# Patient Record
Sex: Female | Born: 1935 | Hispanic: Refuse to answer
Health system: Midwestern US, Community
[De-identification: ages and names within clinical notes are randomized; demographics above are authoritative.]

## PROBLEM LIST (undated history)

## (undated) DIAGNOSIS — G35 Multiple sclerosis: Secondary | ICD-10-CM

## (undated) DIAGNOSIS — E7800 Pure hypercholesterolemia, unspecified: Secondary | ICD-10-CM

## (undated) DIAGNOSIS — F419 Anxiety disorder, unspecified: Secondary | ICD-10-CM

## (undated) DIAGNOSIS — M75100 Unspecified rotator cuff tear or rupture of unspecified shoulder, not specified as traumatic: Secondary | ICD-10-CM

## (undated) DIAGNOSIS — I480 Paroxysmal atrial fibrillation: Secondary | ICD-10-CM

## (undated) DIAGNOSIS — I422 Other hypertrophic cardiomyopathy: Secondary | ICD-10-CM

## (undated) HISTORY — DX: Anxiety disorder, unspecified: F41.9

## (undated) HISTORY — DX: Unspecified rotator cuff tear or rupture of unspecified shoulder, not specified as traumatic: M75.100

## (undated) HISTORY — DX: Multiple sclerosis: G35

## (undated) HISTORY — DX: Pure hypercholesterolemia, unspecified: E78.00

## (undated) HISTORY — DX: Paroxysmal atrial fibrillation: I48.0

---

## 2008-07-08 HISTORY — PX: ULNAR NERVE REPAIR: SHX2594

## 2009-08-14 DIAGNOSIS — K219 Gastro-esophageal reflux disease without esophagitis: Secondary | ICD-10-CM | POA: Insufficient documentation

## 2009-11-13 DIAGNOSIS — E559 Vitamin D deficiency, unspecified: Secondary | ICD-10-CM | POA: Insufficient documentation

## 2010-07-08 DIAGNOSIS — I422 Other hypertrophic cardiomyopathy: Secondary | ICD-10-CM | POA: Insufficient documentation

## 2010-08-16 DIAGNOSIS — E039 Hypothyroidism, unspecified: Secondary | ICD-10-CM | POA: Insufficient documentation

## 2016-03-28 NOTE — Progress Notes (Signed)
Preadmission Screen Chart Review - Text       Preadmission Screening Chart Review Entered On:  03/28/2016 12:33 EDT    Performed On:  03/28/2016 11:44 EDT by Marylou Mccoy B               Data History   Type of Evaluation :   Offsite Pt Evaluation/Medical Record Review   Date of Onset :   03/06/2016 EDT   Referring Physician :   Jimmey Ralph MD   Marylou Mccoy B - 03/28/2016 11:44 EDT   Review  of Present Illness :   82 yo female with past medical history of progressive MS diagnosed in 1989, CVA with right sided weakness, hypothyroidism and neurogenic bladder admitted to [facility] in [place], [place] on 03/06/16 with recurrent generalized weakness of the upper and lower extremities and poor posture. She has been wheelchair bound for the past few years due to right-sided weakness.    *Progression of MS has not improved with IV shock steroids    *Recurrent C diff colitis - resolved  -Diarrhea started on 8/31 and persisted, now resolved    *Ileus on Sept 14 - slow improvement    *Chronic constipation, reported per husband  -KUB shows progression of colonic distention but patient remians asymptomatic except for some mild abdominal distention  -Flex sig done, showed no evidence of malignancy or active bleed.     *Bloody low-grade stool  -H&H stable  -Suspect due to severe diarrhea followed by constipation precipitating hemorrhoidal bleed vs colonic injury    *Asympotomatic hypotension  -Cortisol levels normal  -Limit IV fluids d/t diffuse anasarca and lower extremity swelling    *Edema in bilateral lower extremities  -Chronic  -Ace wraps       Marylou Mccoy B - 03/28/2016 13:33 EDT   Med History   Medication List   (As Of: 03/28/2016 13:57:46 EDT)        Problem History   (As Of: 03/28/2016 13:57:46 EDT)   Problems(Active)    Chronic venous insufficiency (SNOMED CT  :16109604 )  Name of Problem:   Chronic venous insufficiency ; Recorder:   Giove,  Lauri B; Confirmation:   Confirmed ; Classification:   Patient Stated ; Code:    [id number] ; Contributor System:   Dietitian ; Last Updated:   03/28/2016 12:03 EDT ; Life Cycle Date:   03/28/2016 ; Life Cycle Status:   Active ; Vocabulary:   SNOMED CT        CVA (cerebral vascular accident) (SNOMED CT  :147829562 )  Name of Problem:   CVA (cerebral vascular accident) ; Recorder:   Giove,  Lauri B; Confirmation:   Confirmed ; Classification:   Patient Stated ; Code:   [id number] ; Contributor System:   Dietitian ; Last Updated:   03/28/2016 12:02 EDT ; Life Cycle Date:   03/28/2016 ; Life Cycle Status:   Active ; Vocabulary:   SNOMED CT   ; Comments:        03/28/2016 12:02 - Marylou Mccoy B  Residual right sided weakness      Hyperlipidemia (SNOMED CT  :69629528 )  Name of Problem:   Hyperlipidemia ; Recorder:   Giove,  Lauri B; Confirmation:   Confirmed ; Classification:   Patient Stated ; Code:   [id number] ; Contributor System:   PowerChart ; Last Updated:   03/28/2016 12:03 EDT ; Life Cycle Date:   03/28/2016 ; Life Cycle Status:   Active ;  Vocabulary:   SNOMED CT        Hypothyroidism (SNOMED CT  :16109604 )  Name of Problem:   Hypothyroidism ; Recorder:   Giove,  Lauri B; Confirmation:   Confirmed ; Classification:   Patient Stated ; Code:   [id number] ; Contributor System:   Dietitian ; Last Updated:   03/28/2016 12:04 EDT ; Life Cycle Date:   03/28/2016 ; Life Cycle Status:   Active ; Vocabulary:   SNOMED CT        Multiple sclerosis (SNOMED CT  :14782956 )  Name of Problem:   Multiple sclerosis ; Recorder:   Giove,  Lauri B; Confirmation:   Confirmed ; Classification:   Patient Stated ; Code:   [id number] ; Contributor System:   Dietitian ; Last Updated:   03/28/2016 12:02 EDT ; Life Cycle Date:   03/28/2016 ; Life Cycle Status:   Active ; Vocabulary:   SNOMED CT        Neurogenic bladder (SNOMED CT  :8469629528 )  Name of Problem:   Neurogenic bladder ; Recorder:   Giove,  Lauri B; Confirmation:   Confirmed ; Classification:   Patient Stated ; Code:   [id number] ; Contributor System:   Dietitian ;  Last Updated:   03/28/2016 12:03 EDT ; Life Cycle Date:   03/28/2016 ; Life Cycle Status:   Active ; Vocabulary:   SNOMED CT        Osteoporosis (SNOMED CT  :725366440 )  Name of Problem:   Osteoporosis ; Recorder:   Giove,  Lauri B; Confirmation:   Confirmed ; Classification:   Patient Stated ; Code:   [id number] ; Contributor System:   PowerChart ; Last Updated:   03/28/2016 12:04 EDT ; Life Cycle Date:   03/28/2016 ; Life Cycle Status:   Active ; Vocabulary:   SNOMED CT        UTI (urinary tract infection) (SNOMED CT  :387564332 )  Name of Problem:   UTI (urinary tract infection) ; Recorder:   Giove,  Lauri B; Confirmation:   Confirmed ; Classification:   Patient Stated ; Code:   [id number] ; Contributor System:   PowerChart ; Last Updated:   03/28/2016 12:03 EDT ; Life Cycle Date:   03/28/2016 ; Life Cycle Status:   Active ; Vocabulary:   SNOMED CT          Allergy   (As Of: 03/28/2016 13:57:46 EDT)   Allergies (Active)   No Known Allergies  Estimated Onset Date:   Unspecified ; Created By:   Giove,  Lauri B; Reaction Status:   Active ; Category:   Drug ; Substance:   No Known Allergies ; Type:   Allergy ; Updated By:   Trisha Mangle; Reviewed Date:   03/28/2016 12:04 EDT        Data   Preadmission Data Results :   No qualifying data available.     Weight Dosing :   58 kg(Converted to: 127.868 lb, 2,045.890 oz)    Height :   157 cm(Converted to: 5 ft 2 in, 5.15 ft, 61.81 in)    Body Mass Index Dosing :   24 kg/m2   Current IV :   No   Giove,  Lauri B - 03/28/2016 11:44 EDT   Pain Present :   Yes actual or suspected pain   Marylou Mccoy B - 03/28/2016 13:33 EDT   Current PICC Line :   No   PPD Test :  No   Isolation Precautions :   Contact   Organism :   Clostridium Difficile (C-Diff)   Marylou Mccoy B - 03/28/2016 11:44 EDT   Preadm Precautions Grid   Aspiration :   Yes   Fall :   Yes   Transfer/Mobility Limitations :   Yes   Marylou Mccoy B - 03/28/2016 11:44 EDT   Behavioral Problems :   None   Diet Type :   Regular,  Other: low fiber   Marylou Mccoy B - 03/28/2016 11:44 EDT   Pain Assessment   Pain Location :   Shoulder   Giove,  Lauri B - 03/28/2016 13:33 EDT   Lab Values, Diagnostics, Vital Signs   Preadm Lab Results Grid     Date #1          Date Lab Completed :    03/27/2016 EDT              Glucose :    80               Hemoglobin :    12               Platelet :    377               Potassium :    4.3               Sodium :    139               WBC :    5.6                 Giove,  Lauri B - 03/28/2016 13:33 EDT         Other Preadmission Lab Results :   9/20  Cr 0.3  HCT 377         Giove,  Lauri B - 03/28/2016 13:33 EDT   Preadm Recent Vital Sign Grid     Date #1          Date Vital Sign Taken :    03/27/2016 EDT              Respiratory Rate :    16 br/min              Systolic Blood Pressure :    104 mmHg              Diastolic Blood Pressure :    64 mmHg              Comments  (Comment: SpO2 93% RA [Giove,  Lauri B - 03/28/2016 13:33 EDT] )         Marylou Mccoy B - 03/28/2016 13:33 EDT         Lab Results RTF :   No qualifying data available.     Vital Signs Results :   No qualifying data available.     Marylou Mccoy B - 03/28/2016 11:44 EDT   Respiratory   Respiratory Status Results :   No qualifying data available.     Respiratory Needs :   None, Room air   Marylou Mccoy B - 03/28/2016 11:44 EDT   Bowel and Bladder   Premorbid Bladder Management :   Incontinent   Premorbid Urinary Elimination :   Disposable brief, Other: neurogenic bladder with bladder stimulator   Urinary Elimination :   Disposable brief, Other: Per RN, patient  is able to tell her when the brief needs to be changed.   Bladder Management :   Incontinent   Bowel Results :   No qualifying data available.     Premorbid Bowel Management :   Incontinent   Bowel Management :   Incontinent   Ostomy :   No   Last Bowel Movement :   03/27/2016 EDT   Giove,  Lauri B - 03/28/2016 11:44 EDT   Wound   Incision Wound Results :   No qualifying data available.     Trisha Mangle - 03/28/2016 11:44 EDT   Functional Status   Languages :   English   Preferred Mode of Communication :   Verbal   Supports in Hospital? :   Glasses   Trisha Mangle - 03/28/2016 13:33 EDT   Functional Status Results :   No qualifying data available.     Marylou Mccoy B - 03/28/2016 11:44 EDT   Premorbid Medication Self Management   Bathing :   Modified independence   Bed Mobility :   Complete independence   Bed Wheelchair Transfer :   Complete independence   Bladder :   Modified independence   Bowel :   Modified independence   Eating :   Complete independence   Grooming :   Modified independence   Locomotion Walk :   Modified independence   Locomotion Wheelchair :   Complete independence   Lower Extremity Dressing :   Complete independence   Sit to Stand :   Modified independence   Supine to Sit :   Modified independence   Toilet Transfer :   Modified independence   Toileting :   Modified independence   Tub, Shower Transfer :   Modified independence   Upper Extremity Dressing :   Complete independence   Comprehension :   Complete independence   Expression :   Complete independence   Cognition :   Complete independence   Social Interaction :   Complete independence   Medication Self Management :   Modified independence   Marylou Mccoy B - 03/28/2016 11:44 EDT   Current Medication Self-Management   Bathing :   Maximal assistance   Bed Mobility :   Maximal assistance   Bladder :   Total assistance   Bowel :   Total assistance   Eating :   Moderate assistance   Locomotion Wheelchair :   Does not occur   Sit to Stand :   Maximal assistance   Supine to Sit :   Maximal assistance   Tub, shower transfer :   Does not occur   Comprehension :   Complete independence   Expression :   Complete independence   Cognition :   Complete independence   Social Interaction :   Complete independence   Medication Self-Management :   Does not occur   Marylou Mccoy B - 03/28/2016 11:44 EDT   Bed Wheelchair Transfer :   Does not occur    Grooming :   Moderate assistance   Locomotion Walk :   Total assistance   Lower Extremity Dressing :   Maximal assistance   Toilet Transfer :   Maximal assistance   Toileting :   Maximal assistance   Upper Extremity Dressing :   Maximal assistance   Trisha Mangle - 03/28/2016 13:33 EDT   Anticipated Rehab Goals   Bathing :   Minimal  contact assistance   Bed Mobility :   Minimal contact assistance   Bed Wheelchair Transfer :   Minimal contact assistance   Bladder :   Minimal contact assistance   Bowel :   Minimal contact assistance   Eating :   Supervision or setup   Grooming :   Minimal contact assistance   Locomotion Walk :   Minimal contact assistance   Locomotion Wheelchair :   Complete independence   Lower Extremity Dressing :   Minimal contact assistance   Sit to Stand :   Minimal contact assistance   Supine to Sit :   Minimal contact assistance   Toilet Transfer :   Minimal contact assistance   Toileting :   Minimal contact assistance   Tub, Shower Transfer :   Minimal contact assistance   Upper Extremity Dressing :   Minimal contact assistance   Comprehension :   Complete independence   Expression :   Complete independence   Cognition :   Complete independence   Social Interaction :   Complete independence   Medication Self-Management :   Minimal contact assistance   Marylou Mccoy B - 03/28/2016 11:44 EDT   Comments :   Per RN, patient is unable to lift her arms past her nipple line. They are assisting with eating and medication administration. Using hoyer lift to transfer bed to chair.      Marylou Mccoy B - 03/28/2016 11:44 EDT   Care Tool Section GG: Functional Abilities and Goals   Functional Abilities and Goals Grid   GG0100 Self Care :   Needed some help - Patient/Resident needed partial assistance from another person to complete activities. - 2   GG0100 Indoor Mobility (Ambulation) :   Independent - Patient/Resident completed the activities by him/herself, with or without an assistive device, with no  assistance from a helper. - 3   GG0100 Stairs :   Unknown - 8   GG0100 Functional Cognition :   Independent - Patient/Resident completed the activities by him/herself, with or without an assistive device, with no assistance from a helper. - 3   Trisha Mangle - 03/28/2016 11:44 EDT   GG0110 Prior Device Use :   Motorized wheelchair or scooter, Kathleen Lime - 03/28/2016 11:44 EDT   Home Environment   Home Environment Results :   No qualifying data available.     Living Situation :   Home with family support   Lives With :   Family, Spouse   Lives In :   Single level home   Prior Devices/Aids used by Patient :   Agricultural consultant, Motorized wheelchair or scooter   Prior Devices/Aids Details :   Patient uses motorized wheelchair. Ambulates to bathroom using a rolling walker     Trisha Mangle - 03/28/2016 11:44 EDT   Home Setup Grid   Primary Bedroom :   1st floor   Primary Bathroom :   1st floor   Kitchen :   1st floor   Laundry :   1st floor   Trisha Mangle - 03/28/2016 11:44 EDT   Patient's Responsibilities Rehab :   Leisure/Play/Hobbies, Personal ADL, Social participation   Patient's Lifestyle :   Sedentary   Community Services/Current Home Treatments :   Handicap Placard   Preadm Support System :   Patient lives with very supportive husband, Kiaira Pointer [phone] in [place], [place]. They have a daughter  in [place], [place] [phone] and a daughter in [place].   Preadm Amt/Type of Assist Caregiver Able to Prov :   24-hour Supervision   Culture/Spiritual Beliefs to Incorporate :   No   Marylou Mccoy B - 03/28/2016 11:44 EDT   Comments :   Intention is to relocate from [place] to [place] or [place] on discharge from [facility]     Trisha Mangle - 03/28/2016 13:33 EDT   Plan   Risk for Clinical Complications and Medical Necessity for Inpatient Acute Rehabilitation Care :   Requires medical management/24-hour nursing of complex comorbidities, labs (Metabolic Panel,  Prothrombin/INR, CBC, Vitamin D, UA), medications (see medications list), pain, sleep hygiene, anticoagulation, nutrition, hydration, neurological, pulmonary, and cardiac status, and preventive healthcare.    At risk for the following complications: DVT/PE, pneumonia, malnutrition, neurological decline, respiratory insufficiency,   worsening activity intolerance, complications from anticoagulation, skin breakdown, inadequate sleep, recurring stroke, and constipation.       Preadm Expected Level of Improvement :   Fair   Preadm Patient/Family Agreement :   Yes   Rehab Expected Length of Stay :   16 days   Preadm PT Hours/Day :   1.5   Preadm OT Hours/Day :   1.5   Preadm SLP Hours/Day :   N/A   Rehab Nursing Care :   24/7   Preadm Neuropsychology Hours/Day :   N/A   Preadm Prosthetics/Orthotics Hrs/Day :   N/A   Marylou Mccoy B - 03/28/2016 11:44 EDT   Additional Information   Preadm Additional Information :   Patient transferring from [facility] system in [place], [place],  [place] B - 03/28/2016 11:44 EDT   Benefits   Insurance Information :   Medicare, Other: BCBS medicare supplement, secondary   Marylou Mccoy B - 03/28/2016 11:44 EDT   Preadmission Review   Appropriate for Inpatient Rehab Hospital Admission :   Willing to participate in prescribed intensity of care, Able to actively participate in 3hrs/5 times per wk therapy service, Medical conditions can be managed in the inpt rehab unit, Patient requires an intensive level of rehab services   Estimated Length of Stay :   3 weeks   Rationale for Admission to Inpt Rehab :   82 yo lady from [place] with progressive multiple sclerosis dianosed in 1989 with recurrent C. Diff and constipation as well as neurogenic bladder who was admitted to [facility] hospital on August 30  with increasing weakness with multiple chronic issues concerning bowel and bladder whose family requested transfer to [facility] for furhter intensive therapy in order to get back to  her premorbid level of functioning at the wheelchair level . who would benefit from therpies as well as internal medicine , urology , and neurological evaluations .       Preadm Prognosis :   Guarded   Preadm Prognosis Guarded Due To :   Severity of impairment   Requires Inpatient Rehab Services :   Physical Therapy, Occupational Therapy, Speech Language Pathology, Nursing, Case Management, Registered Dietician, Social Work   Anticipated Discharge Destination :   Home under care of organized home health service org   Anticipated Discharge Services :   Physical Therapy, Occupational Therapy, Nursing   Physician Review of Preadmission Screening :   I agree with the preadmission screening as documented   Genice Rouge - 03/28/2016 16:43 EDT   Expected Admit Date  to IRF :   03/29/2016 EDT   Funding reviewed with Pt/Caregiver :   Yes   Marylou MccoyGiove,  Lauri B - 03/28/2016 11:44 EDT

## 2016-03-29 NOTE — Nursing Note (Signed)
Adult Patient History Form-Text       Adult Patient History Entered On:  03/29/2016 22:35 EDT    Performed On:  03/29/2016 22:18 EDT by Elmer RampSIOSON, RN, MARIA NORLIS               General Info   In Clinical Trial With Signed Consent for Related Condition :   No signed consent for clinical trial   Mervin HackSIOSON, RN, MARIA NORLIS - 03/30/2016 4:43 EDT   Preferred Name :   Thedora HindersSandie   [facility] [place]   Mode of Arrival on Unit :   Stretcher   Accompanied By :   Myrtie HawkSpouse   In Charge of News (ICON) Name :   Carie Caddylfred Hafford [phone]   Information Given By :   Self   Primary Language :   English   Pregnancy Status :   N/A   Has the patient received chemotherapy or biotherapy within the last 48 hours? :   No   Is the patient currently (2-3 days) receiving radiation treatment? :   No   SIOSON, RN, MARIA NORLIS - 03/29/2016 22:18 EDT   Problem History   Most Recent Hospitalization(s)     Hospitalization #1  Hospitalization #2  Hospitalization #3      Date :    December 26, 2015  ([facility] [[place], [place] NORL[place] - 03/29/2016 22:18 EDT] )             Reason :    multiple sclerosis   UTI   Right leg fracture  (Comment: 4 yrs [facility] [SIOSON, RN, [place] NORL[place] - 03/29/2016 22:18 EDT] )         Elmer RampSIOSON, RN, MARIA NORLIS - 03/29/2016 22:18 EDT  SIOSON, RN, MARIA NORLIS - 03/29/2016 22:18 EDT  SIOSON, RN, MARIA NORLIS - 03/29/2016 22:18 EDT       (As Of: 03/29/2016 22:35:31 EDT)   Problems(Active)    Chronic venous insufficiency (SNOMED CT  :1308657834824018 )  Name of Problem:   Chronic venous insufficiency ; Recorder:   Giove,  Lauri B; Confirmation:   Confirmed ; Classification:   Patient Stated ; Code:   [id number] ; Contributor System:   DietitianowerChart ; Last Updated:   03/28/2016 12:03 EDT ; Life Cycle Date:   03/28/2016 ; Life Cycle Status:   Active ; Vocabulary:   SNOMED CT        Hyperlipidemia (SNOMED CT  :8413244092826017 )  Name of Problem:   Hyperlipidemia ; Recorder:   Giove,  Lauri B; Confirmation:   Confirmed ;  Classification:   Patient Stated ; Code:   [id number] ; Contributor System:   PowerChart ; Last Updated:   03/28/2016 12:03 EDT ; Life Cycle Date:   03/28/2016 ; Life Cycle Status:   Active ; Vocabulary:   SNOMED CT        Hypothyroidism (SNOMED CT  :6440347468268011 )  Name of Problem:   Hypothyroidism ; Recorder:   Giove,  Lauri B; Confirmation:   Confirmed ; Classification:   Patient Stated ; Code:   2595638768268011 ; Contributor System:   DietitianowerChart ; Last Updated:   03/28/2016 12:04 EDT ; Life Cycle Date:   03/28/2016 ; Life Cycle Status:   Active ; Vocabulary:   SNOMED CT        Multiple sclerosis (SNOMED CT  :5643329541398015 )  Name of Problem:   Multiple sclerosis ; Recorder:   Giove,  Lauri B; Confirmation:  Confirmed ; Classification:   Patient Stated ; Code:   [id number] ; Contributor System:   Dietitian ; Last Updated:   03/28/2016 12:02 EDT ; Life Cycle Date:   03/28/2016 ; Life Cycle Status:   Active ; Vocabulary:   SNOMED CT        Neurogenic bladder (SNOMED CT  :8119147829 )  Name of Problem:   Neurogenic bladder ; Recorder:   Giove,  Lauri B; Confirmation:   Confirmed ; Classification:   Patient Stated ; Code:   [id number] ; Contributor System:   Dietitian ; Last Updated:   03/28/2016 12:03 EDT ; Life Cycle Date:   03/28/2016 ; Life Cycle Status:   Active ; Vocabulary:   SNOMED CT        Osteoporosis (SNOMED CT  :846962952 )  Name of Problem:   Osteoporosis ; Recorder:   Giove,  Lauri B; Confirmation:   Confirmed ; Classification:   Patient Stated ; Code:   841324401 ; Contributor System:   PowerChart ; Last Updated:   03/28/2016 12:04 EDT ; Life Cycle Date:   03/28/2016 ; Life Cycle Status:   Active ; Vocabulary:   SNOMED CT        UTI (urinary tract infection) (SNOMED CT  :027253664 )  Name of Problem:   UTI (urinary tract infection) ; Recorder:   Giove,  Lauri B; Confirmation:   Confirmed ; Classification:   Patient Stated ; Code:   403474259 ; Contributor System:   PowerChart ; Last Updated:   03/28/2016 12:03 EDT ; Life Cycle  Date:   03/28/2016 ; Life Cycle Status:   Active ; Vocabulary:   SNOMED CT          Diagnoses(Active)    C. difficile colitis  Date:   03/29/2016 ; Diagnosis Type:   Discharge ; Confirmation:   Confirmed ; Clinical Dx:   C. difficile colitis ; Classification:   Medical ; Code:   ICD-10-CM ; Probability:   0 ; Diagnosis Code:   A04.7      Chronic constipation  Date:   03/29/2016 ; Diagnosis Type:   Discharge ; Confirmation:   Confirmed ; Clinical Dx:   Chronic constipation ; Classification:   Medical ; Code:   ICD-10-CM ; Probability:   0 ; Diagnosis Code:   K59.09      Chronic venous insufficiency  Date:   03/29/2016 ; Diagnosis Type:   Discharge ; Confirmation:   Confirmed ; Clinical Dx:   Chronic venous insufficiency ; Classification:   Medical ; Code:   ICD-10-CM ; Probability:   0 ; Diagnosis Code:   I87.2      Hypothyroidism  Date:   03/29/2016 ; Diagnosis Type:   Discharge ; Confirmation:   Confirmed ; Clinical Dx:   Hypothyroidism ; Classification:   Medical ; Code:   ICD-10-CM ; Probability:   0 ; Diagnosis Code:   E03.9      Ileus  Date:   03/29/2016 ; Diagnosis Type:   Discharge ; Confirmation:   Confirmed ; Clinical Dx:   Ileus ; Classification:   Medical ; Code:   ICD-10-CM ; Probability:   0 ; Diagnosis Code:   K56.7      Multiple sclerosis  Date:   03/29/2016 ; Diagnosis Type:   Discharge ; Confirmation:   Confirmed ; Clinical Dx:   Multiple sclerosis ; Classification:   Medical ; Code:   ICD-10-CM ; Probability:   0 ; Diagnosis Code:   G35  Neurogenic bladder  Date:   03/29/2016 ; Diagnosis Type:   Discharge ; Confirmation:   Confirmed ; Clinical Dx:   Neurogenic bladder ; Classification:   Medical ; Code:   ICD-10-CM ; Probability:   0 ; Diagnosis Code:   N31.9      UTI (urinary tract infection)  Date:   03/29/2016 ; Diagnosis Type:   Discharge ; Confirmation:   Confirmed ; Clinical Dx:   UTI (urinary tract infection) ; Classification:   Medical ; Code:   ICD-10-CM ; Probability:   0 ; Diagnosis Code:    N39.0        Family History   Family History   (As Of: 03/29/2016 22:35:31 EDT)     Allergy   (As Of: 03/29/2016 22:35:31 EDT)   Allergies (Active)   No Known Allergies  Estimated Onset Date:   Unspecified ; Created By:   Giove,  Lauri B; Reaction Status:   Active ; Category:   Drug ; Substance:   No Known Allergies ; Type:   Allergy ; Updated By:   Trisha Mangle; Reviewed Date:   03/29/2016 22:24 EDT        Immunizations   Influenza Vaccine Status :   Non-influenza season (before Oct 1st and after Mar 31st)   Last Tetanus :   Unknown   SIOSON, RN, MARIA NORLIS - 03/29/2016 22:18 EDT   ID Risk Screen   Patient Recent Travel History :   No recent travel   Family Member Travel History :   Unable to obtain   SIOSON, RN, MARIA NORLIS - 03/29/2016 22:18 EDT   Infectious Disease Risk Factor Grid   Chills :   No   Fever :   No   Fatigue :   Yes   Headache :   No   Runny or Stuffy Nose :   No   Sore Throat :   No   Shortness of Breath :   No   New or Worsening Cough :   No   Wheezing :   No   Vomiting :   No   Diarrhea :   No   Abdominal (Stomach Pain) :   No   Muscle Pain :   No   Weakness/Numbness :   Yes   Recent Exposure to Communicable Disease :   No   Illness With Generalized Rash :   No   SIOSON, RN, MARIA NORLIS - 03/29/2016 22:18 EDT   Verify Airborne, Contact Isolation for MERS :   N/A   SIOSON, RN, MARIA NORLIS - 03/29/2016 22:18 EDT   C. diff Screen   Have you had 3 or more loose/watery stool in 24 hours? :   No - but history of C. diff in last 6 months   SIOSON, RN, [place] NORLIS - 03/29/2016 22:18 EDT   Procedure History   Medical Devices :   Stimulator   SIOSON, RN, MARIA NORLIS - 03/30/2016 4:43 EDT          -    Procedure History   (As Of: 03/30/2016 04:44:17 EDT)     Anesthesia Minutes:   0 ; Procedure Name:   ulnar nerve surgery right hand ; Procedure Minutes:   0 ; Comments:     03/29/2016 22:28 - SIOSON, RN, MARIA NORLIS  5 yrs ago ; Last Reviewed Dt/Tm:   03/29/2016 33:29:51 EDT            Anesthesia Minutes:  0  ; Procedure Name:   bladder stimulator ; Procedure Minutes:   0 ; Last Reviewed Dt/Tm:   03/30/2016 04:42:56 EDT            Anesthesia/Sedation   Anesthesia History :   Prior general anesthesia   Anesthesia Reaction :   None   SIOSON, RN, MARIA NORLIS - 03/29/2016 22:18 EDT   Transfusion/Bloodless Med   Transfusion History :   No prior transfusion   Will Patient Accept Blood Transfusion and/or Blood Products :   Yes   SIOSON, RN, MARIA NORLIS - 03/29/2016 22:18 EDT   Nutrition   Home Diet :   Regular   Appetite :   Good   Feeding Ability :   Complete independence   Home Liquid Viscosity :   Thin   Unintentional Weight Change Greater Than 10 lbs in the Last 6 Months :   No   SIOSON, RN, MARIA NORLIS - 03/29/2016 22:18 EDT   Functional   Current Level of Assistance for Self-Care/Mobility :   New decline from baseline   Sleeping Behaviors :   Reports no problems   SIOSON, RN, MARIA NORLIS - 03/30/2016 0:22 EDT   Cognitive Function Concerns Prior to Admission :   None reported   Fear of Falling :   No   Ability to Ambulate Prior to Admission :   Partial assistance   SIOSON, RN, MARIA NORLIS - 03/29/2016 22:18 EDT   Ability to Move in Bed Prior to Admission :   Total assistance     ADLs :   Complete assist   SIOSON, RN, MARIA NORLIS - 03/30/2016 0:22 EDT     Functional Assessment   Bathing :   Requires assistance (1)   Dressing :   Requires assistance (1)   Toileting :   Requires assistance (1)   Transferring Bed or Chair :   Requires assistance (1)   Continence :   Dependent (0)   Feeding :   Requires assistance (1)   ADL Index Score :   5    SIOSON, RN, MARIA NORLIS - 03/30/2016 0:22 EDT   Living and Resources   Living Situation :   Home with family support   Devices/Equipment :   Information systems manager Skilled Services :   Occupational Therapy, Physical Therapy   Lines/Tubes Present on Admission :   None   SIOSON, RN, MARIA NORLIS - 03/29/2016 22:18 EDT   Social History   Social History   (As Of: 04/01/2016 14:20:53 EDT)    Tobacco:        Never smoker   (Last Updated: 04/01/2016 14:20:49 EDT by Katrinka Blazing, RN, KRISTINA K)          Alcohol:        Denies   (Last Updated: 03/29/2016 22:35:10 EDT by Elmer Ramp, RN, MARIA NORLIS)          Substance Abuse:        Denies   (Last Updated: 03/29/2016 22:35:19 EDT by Elmer Ramp, RN, MARIA NORLIS)            Sexual Assault/Domestic Violence Screen   Have You Ever Been Emotionally or Physically Abused by Your Partner :   No   Have You Been Physically Hurt by Someone Within the Past Year :   No   Within the Last Year, Has Anyone Forced You to Have Sexual Activity :   No   SIOSON, RN, MARIA NORLIS - 03/29/2016 22:18 EDT  Spiritual   Do You Receive Comfort From Spiritual Practices :   Yes   Hospital Clergy to Visit :   No   Spiritual Advisor/Minister to be Notified :   No   Cheral Marker - 04/01/2016 14:20 EDT   Religious Preference :   Catholic   SIOSON, RN, MARIA NORLIS - 03/29/2016 22:18 EDT   Advance Directive   *Advance Directive :   Yes   Type of Advance Directive :   Living will   Location of Advance Directive :   Family to bring in copy from home   Patient Wishes to Receive Further Information on Advance Directives :   No   SIOSON, RN, MARIA NORLIS - 03/29/2016 22:18 EDT   Educ Needs   Barriers to Learning :   Acuity of illness   SIOSON, RN, MARIA NORLIS - 03/29/2016 22:18 EDT

## 2016-03-29 NOTE — Progress Notes (Signed)
Nursing Admission Indep Measure - Text       Nursing Admission Indep Measure Scores Entered On:  03/29/2016 23:25 EDT    Performed On:  03/29/2016 23:24 EDT by Elmer Ramp, RN, MARIA NORLIS               Admission Scores   *Number of Bowel Accidents Past Four Days Prior to Admission :   1    *Number of Bladder Accidents Past Four Days Prior to Admission :   1    SIOSON, RN, MARIA NORLIS - 03/29/2016 23:24 EDT   SIOSON, RN, MARIA NORLIS - 03/29/2016 23:24 EDT   Activities of Daily Living Grid   Eating :   Maximal assistance   SIOSON, RN, MARIA NORLIS - 03/30/2016 0:57 EDT     Toileting :   Total assistance   Bed, Chair, Wheelchair Transfer :   Total assistance   Toilet Transfer :   Total assistance   *Comprehension :   Modified independence   SIOSON, RN, MARIA NORLIS - 03/30/2016 0:57 EDT     *Comprehension Mode :   Auditory, Visual   SIOSON, RN, MARIA NORLIS - 03/29/2016 23:24 EDT   *Expression :   Modified independence   SIOSON, RN, MARIA NORLIS - 03/30/2016 0:57 EDT     *Expression Mode :   Vocal   SIOSON, RN, MARIA NORLIS - 03/29/2016 23:24 EDT   Problem Solving Indep Measure :   Modified independence     Memory Indep Measure :   Modified independence   SIOSON, RN, MARIA NORLIS - 03/30/2016 0:57 EDT     *Social Interaction :   Complete independence   SIOSON, RN, MARIA NORLIS - 03/29/2016 23:24 EDT   Goals   Bladder Level of Assistance Goal :   Supervision or setup     Bowel Level of Assistance Goal :   Supervision or setup   SIOSON, RN, MARIA NORLIS - 03/30/2016 0:57 EDT

## 2016-03-29 NOTE — Progress Notes (Signed)
Anticoagulation Monitoring - Text       Pharmacy Anticoagulation Monitoring Entered On:  03/29/2016 21:52 EDT    Performed On:  03/29/2016 21:52 EDT by Jerolyn Center               Anticoagulation Monitoring Chart   Indication for Treatment :   VTE prophylaxis   WATTS,  KATIE - 03/29/2016 21:52 EDT

## 2016-03-29 NOTE — Nursing Note (Signed)
Basic Admission Information - Text       Basic Admission Information Adult Entered On:  03/29/2016 23:14 EDT    Performed On:  03/29/2016 20:08 EDT by Elmer Ramp, RN, MARIA NORLIS               Vital Signs   Temperature Oral :   36.6 degC   Peripheral Pulse Rate :   83 bpm   Respiratory Rate :   20 br/min   Systolic/ Diastolic BP :   124 mmHg   Diastolic Blood Pressure :   101 mmHg (>HHI)    SpO2 :   97 %   O2 Therapy :   Room air   SIOSON, RN, MARIA NORLIS - 03/29/2016 23:13 EDT

## 2016-03-29 NOTE — H&P (Signed)
 Rehab [facility] E. Stanly, MD  Service Date: 03/29/2016    Post-admission evaluation completed on 03/29/2016 at 7:00 p.m.    ADMITTING PHYSICIAN:  Dr. Lynwood Stanly    REFERRING PHYSICIAN:  Dr. Jakie Decent    PRIMARY CARE PHYSICIAN:  Dr. Genevie Lulas    NEUROLOGIST:  Dr. Donnice Lee in [place], [place] .    ADMITTING DIAGNOSES:  1.  Debility secondary to multiple sclerosis flareup.  2.  Clostridium difficile colitis, treated.  3.  Urinary tract infection, treated.  4.  Ileus, treated.  5.  Chronic venous insufficiency.  6.  Hypothyroidism.  7.  Neurogenic bladder.  8.  Neurogenic bowel.  9.  Prior rotator cuff injury on the left shoulder.    CHIEF COMPLAINT AND HISTORY OF PRESENT ILLNESS:  This 82 year old  white female from [place], [place]  with progressive multiple sclerosis  and neurogenic bowel and bladder that was diagnosed in 1989, who is  wheelchair confined, was admitted to the [facility] in [place], [place]  on August 30th with a 3-day history of  increased weakness.  She had been seen in the Emergency Department 3  days earlier and had been admitted and discharged on August 29th but  was brought back due to increasing weakness.  She had previously been  admitted 2 months earlier with  increased lower extremity weakness.   Workup included x-rays which were remarkable only for osteoarthritis  involving the thoracic spine, bilateral shoulders, and left AC joint.   Chest x-ray showed calcified granuloma in the right lung base.  CT  scan of the head was consistent with chronic demyelination and no  findings of acute ischemic infarction.  This was compared to an MRI  that had been done in November 2016.  She has been wheelchair confined  for the last 3 years.  She was given IV shock steroid treatment with  some initial benefit.  On August 31st, she began having diarrhea was  treated with vancomycin and Flagyl, but her family requested that she  be changed to  Dificid as she has had recurrent episodes of C.  difficile colitis 7-8 times in the past.  They are interested in  eventually getting a fecal transplant but know that that is not  available at this facility.  She completed 9 days of Dificid and  Flagyl and was seen by an infectious disease consultant who indicated  that she could discontinue her medication.  She was also found to have  an ileus on September 14th which resolved by September 19th.  KUB of  the abdomen was significant for colonic distention, and she was  therefore seen by gastroenterology.  She had a large bowel movement on  September 21st after getting a Gastrografin enema.  She also underwent  flexible sigmoidoscopy to the transverse colon due to bleeding which  was felt to be secondary to hemorrhoids.  She also had hypotension,  but because of anasarca, IV fluids were limited, and they have been  using Ace wraps, and she has been getting albumin infusion on a p.r.n.  basis.  She was referred to physical and occupational therapies and  remains dependent for all self-care and mobility.  She uses a  motorized wheelchair at home.  She has been requiring Hoyer lift  transfers.  She is maximum assistance for upper and lower extremity  dressing, maximum assistance for toileting,  and maximum assistance to  come from sitting to standing.  She has poor trunk control.  Up until  this year, however, she was able to transfer herself.  She is followed  by Dr. Donnice Lee in [place] for her multiple sclerosis and is  treated with Ampyra.  She is also on baclofen for spasticity and  gabapentin for pain.  She has a bladder stimulator for her neurogenic  bladder which does not work due to a loose lead and is also on oxybutynin   5 mg daily.  She has had multiple UTI's but no cultures are available.   She suffers fromchronic constipation and is on various bowel medicines.  Her family  hopes for her to eventually be evaluated as an outpatient in [place]  at [facility] for her multiple sclerosis but requested transfer to  this facility since she has a daughter who lives here in [place].  Thru the years,multiple drugs have been trialed for treatment of her progressive   MS including Capaxone,Betaserone, Tysabri,Avonex, and others with transient   benefit. Their ultlimate  goal is to return to [place] where they have some in home  assistance but the majority of care is provied by the patiient's husband.    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  Her multiple sclerosis was  diagnosed in 1989.  She has a history of a right femur fracture.  She  has osteoarthritis, a left rotator cuff injury, scoliosis, and a  thyroid nodule.    PAST SURGICAL HISTORY:  Significant only for the bladder stimulator  and right ulnar nerve release.    ALLERGIES:  She has no known drug allergies.    FAMILY HISTORY:  Mother died at age 58 and father at 29.    SOCIAL HISTORY:  She lives in [place], [place]  with her husband, but  they moved there from [place], [place] .  She has adult children, 1  who lives in [place] and 1 who lives in [place]  and one in [place] ..  She uses  alcohol occasionally but has never used tobacco nor illicit drugs. She retired as an Tourist information centre manager.    REVIEW OF SYSTEMS:  She has some balance deficits, coordination  deficits, strength deficits, endurance deficits, range of motion  deficits, and safety awareness deficits.  She fatigues easily.  She is  considered a fall risk and has contact precautions because of her  history of C. difficile colitis. She admits to the the adverse effects of   summer heat of [place]  on her MS. but suffered minimal damage from  the recent hurricain.   Other review of systems is negative  except for those items  mentioned in history of present illness.    PHYSICAL EXAMINATION:  Her weight is 58 kilograms, and her height is 5  feet 2 inches.  Her temperature this morning was 36.6 Celsius with a  pulse of 86, blood pressure 105/66, and O2 sats of 93%.  She is alert,  cooperative, pleasant, and oriented.  Her head is normocephalic.  Her  neck is supple.  There are no bruits.  Her lungs are clear to  auscultation.  Her heart shows regular rhythm.  Abdomen is soft but  distended.  There are positive bowel sounds.  She has 2+ lower  extremity edema bilaterally.  She is  intact cognitively..  Cranial  nerves are intact.  She has weakness in bilateral upper and lower  extremities worse on the right than the left.  Reflexes are  hyperactive.  Sensation is reported to be intact.  She requires  assistance for all bed mobility.  Her left lower extremity is  generally 3 to 4 minus out of 5, the right lower extremity is zero  dorsiflexion, 2 out of 5 plantar flexion, and 3 out of 5 knee  extension.  Her back and sacrum were not inspected at this time and  will be looked at later with nursing.    ASSESSMENT:  Progressive multiple sclerosis with recent flare up and  complications of recurrent Clostridium difficile colitis, neurogenic  bowel and bladder, recent ileus, and left rotator cuff injury who has  been transferred to maximize her independence.    PLAN:  She will be treated with an interdisciplinary team approach  consisting of physical and occupational therapy, social services,  rehab nursing, physicians, and speech language pathology.  Physical  and occupational therapies will design programs that will increase her  strength and stamina and safety in regards to dressing, bathing,  toileting, transitional movements, and wheelchair mechanics and  propulsion at a moderate assistance level.  Speech language pathology  will assess her cognitive functioning.  Rehabilitation nursing will  monitor bowel and bladder functioning and maintain  skin integrity.  We  shall ask Dr. Dale Rimes, a family friend, to assess her urological  status and Dr. Elsie Cleverly to provide medical management.  We shall  also ask neurology to evaluate her next week.  We shall get baseline  lower extremity Dopplers to rule out any evidence of existing deep  vein thrombophlebitis and shall follow her ourselves on a daily basis  to monitor for any intercurrent illnesses that may interfere with her  participation in rehabilitation.  There are no discrepancies noted on  the pre-admission evaluation tool that was completed this morning, and  she appears to have the motivation to participate the requisite 3  hours of therapy 5 days a week.  We shall have weekly team conferences  to upgrade her plan of care and determine her length of stay, but she  will likely benefit from 2-3 weeks to get to a level that will allow  her to go to her daughter's home with home health therapies.  Her medical  prognosis for achieving her goals in rehab is guarded, and barriers  will be the severity of her impairment.      Lynwood FORBES Bruce, MD  TR: *n DD: 03/29/2016 19:13 TD: 03/29/2016 20:08 Job#: [id number]  \[id number]#: [id number]  \[id number]\    Signature Line     Electronically Signed on 03/30/2016 03:20 PM EDT   ________________________________________________   MARZETTE LYNWOOD FORBES               Modified by: MARZETTE LYNWOOD FORBES on 03/30/2016 03:20 PM EDT      Modified by: MARZETTE LYNWOOD FORBES on 03/30/2016 03:20 PM EDT

## 2016-03-30 NOTE — Progress Notes (Signed)
Functional Indep Measure Scores - Text       Functional Independence Measure Scores Entered On:  03/30/2016 16:34 EDT    Performed On:  03/30/2016 16:32 EDT by HOBBY, OT, LESLIE M               Eating Score   Patient's Independence Level With Eating Tasks :   Setup/Supervision   Supervision or Setup Needed :   Gather equipment, Open containers, Pour liquid into containers   Functional Independence Measure Eating :   Supervision or setup   HOBBY, OT, LESLIE M - 03/30/2016 16:32 EDT   Grooming Score   Patient's Independence Level with Grooming Tasks :   Assistance   Type of Assistance Needed :   Assistance with grooming tasks   Task Assessed :   Combing/Brushing hair, Oral care, Washing/Drying face, Washing/Drying hands   Tasks Requiring Assistance :   Combing/Brushing hair, Washing/Drying hands   Grooming :   Moderate assistance   HOBBY, OT, LESLIE M - 03/30/2016 16:32 EDT   Bathing Score   Patient's Independence Level with Bathing Tasks :   Assistance   Type of Assistance Necessary :   Requires assistance of 2 people   Functional Independence Measure Bathing :   Total assistance   HOBBY, OT, LESLIE M - 03/30/2016 16:32 EDT   Upper Body Dressing Score   Patient's Independence Level with Upper Body Dressing Tasks :   Assistance   Type of Assistance Necessary :   Assistance with dressing tasks   Tasks Assessed :   Thread/Unthread right sleeve, Thread/Unthread left sleeve, Pull/Remove head through neckline, Pull/Remove over trunk, Pull around the back, Hook/Unhook bra, Thread/Unthread right bra strap, Thread/Unthread left bra strap   Tasks Requiring Assistance :   Thread/Unthread right sleeve, Pull/Remove head through neckline, Pull/Remove over trunk, Pull around the back, Hook/Unhook bra, Thread/Unthread right bra strap   UE Dressing :   Maximal assistance   HOBBY, OT, LESLIE M - 03/30/2016 16:32 EDT   Lower Body Dressing Score   Patient's independence Level with Lower Body Dressing Tasks :   Assistance   Type of Assistance  Necessary :   Requires assistance of 2 people   LE Dressing :   Total assistance   HOBBY, OT, LESLIE M - 03/30/2016 16:32 EDT   Toileting Score   Patient's Independence Level with Toileting Tasks :   Assistance   Type of Assistance Necessary :   Requires assistance of 2 people   Toileting :   Total assistance   HOBBY, OT, LESLIE M - 03/30/2016 16:32 EDT   Transfer Bed/Chair/WC Score   Patient's independence Level with Bed, Chair, Wheelchair Tasks :   Assistance   Type of Assistance Necessary :   Mechanical lift required, Total assist (patient performs less than 25%)   Bed, Chair, Wheelchair Transfer :   Total assistance   HOBBY, OT, LESLIE M - 03/30/2016 16:32 EDT   Transfer Toilet Score   Patient's Independence Level with Transfer Toilet Tasks :   Assistance   Amount of Assistance Necessary :   Mechanical lift required, Total assist (patient performs less than 25%)   Transfer Toilet :   Total assistance   HOBBY, OT, LESLIE M - 03/30/2016 16:32 EDT   Comprehension Score   Mode of Comprehension :   Auditory   Comprehends Complex or Abstract Information Without Prompting or Cueing :   Yes   Understands Complex or Abstract Directions and Conversations :   Mild  difficulty   Comprehension Indep Measure Interim :   Modified independence   HOBBY, OT, LESLIE M - 03/30/2016 16:32 EDT   Expression Score   Expression Mode :   Vocal   Expresses Complex or Abstract Information Without Prompting or Cueing :   Yes   Expresses Complex or Abstract Ideas :   Clearly and fluently at all times   Expression Indep Measure Interim :   Complete independence   HOBBY, OT, LESLIE M - 03/30/2016 16:32 EDT   Social Interaction Score   Interacts Appropriately Without Supervision :   Yes   Interacts Appropriately :   Most of the time and only occasionally loses control, Requires more than reasonable time to make decisions   Social Interaction Indep Measure Interim :   Modified independence   HOBBY, OT, LESLIE M - 03/30/2016 16:32 EDT   Problem Solving  Score   Solves Complex Problems :   Yes   Ability to Solve Complex Problems :   Makes decisions with mild difficulty   Problem Solving Indep Measure Interim :   Modified independence   HOBBY, OT, LESLIE M - 03/30/2016 16:32 EDT   Memory Score   Recognizes, Remembers Routines, and Executes Requests Without Prompting :   Yes   Remembers and Executes Requests :   Mild difficulty   Memory Indep Measure Interim :   Modified independence   HOBBY, OT, LESLIE M - 03/30/2016 16:32 EDT

## 2016-03-30 NOTE — Progress Notes (Signed)
Functional Indep Measure Scores - Text       Functional Independence Measure Scores Entered On:  03/30/2016 21:34 EDT    Performed On:  03/30/2016 21:32 EDT by Moshe Cipro, RN, ELIZABETH C               Eating Score   Patient's Independence Level With Eating Tasks :   Independent/Modified independence   Independent or Modifications Needed :   Complete independence   Functional Independence Measure Eating :   Complete independence   MANAHAN, RN, ELIZABETH C - 03/30/2016 21:32 EDT   Toileting Score   Patient's Independence Level with Toileting Tasks :   Assistance   Type of Assistance Necessary :   Requires assistance of 2 people   Toileting :   Total assistance   [facility], RN, Dorise Hiss - 03/30/2016 21:32 EDT   Bladder Management Score   Patient's Independence Level with Bladder Management Tasks :   Assistance   Type of Assistance Necessary :   Helper positions and holds bedpan, assists patient to roll on AND off the bedpan, Helper changes linen or clothing/cleans up spill, Helper changes diaper or absorbent pad, Helper initiates timed voiding schedule, Two helpers needed   Functional Independence Measure Bladder Management :   Total assistance   [facility], RN, ELIZABETH C - 03/30/2016 21:32 EDT   Bowel Management Score   Patient's Independence Level with Bowel Management Tasks :   Assistance   Type of Assistance Necessary :   Helper positions and holds bedpan, assists patient to roll on AND off the bedpan, Helper changes linen or clothing/cleans up spill, Helper changes diaper, Two helpers needed   Functional Independence Measure Bowel Management :   Total assistance   [facility], RN, Dorise Hiss - 03/30/2016 21:32 EDT   Transfer Bed/Chair/WC Score   Patient's independence Level with Bed, Chair, Wheelchair Tasks :   Assistance   Type of Assistance Necessary :   Two helpers needed, Mechanical lift required   Bed, Chair, Wheelchair Transfer :   Total assistance   [facility], RN, Dorise Hiss 03/30/2016 21:32 EDT   Comprehension Score    Mode of Comprehension :   Auditory   Comprehends Complex or Abstract Information Without Prompting or Cueing :   Yes   Understands Complex or Abstract Directions and Conversations :   At all times   Comprehension Indep Measure Interim :   Complete independence   [facility], RN, Dorise Hiss - 03/30/2016 21:32 EDT   Expression Score   Expression Mode :   Vocal   Expresses Complex or Abstract Information Without Prompting or Cueing :   Yes   Expresses Complex or Abstract Ideas :   Clearly and fluently at all times   Expression Indep Measure Interim :   Complete independence   Moshe Cipro RN, Dorise Hiss - 03/30/2016 21:32 EDT   Social Interaction Score   Interacts Appropriately Without Supervision :   Yes   Interacts Appropriately :   At all times   Social Interaction Indep Measure Interim :   Complete independence   Moshe Cipro, RN, Dorise Hiss 03/30/2016 21:32 EDT

## 2016-03-30 NOTE — Progress Notes (Signed)
Functional Indep Measure Scores - Text       Functional Independence Measure Scores Entered On:  03/30/2016 11:40 EDT    Performed On:  03/30/2016 9:30 EDT by Eliseo Gum, PT, AMBER R               Transfer Bed/Chair/WC Score   Patient's independence Level with Bed, Chair, Wheelchair Tasks :   Assistance   Type of Assistance Necessary :   Assistance for more than half (patient performs 25% - 49% of tasks)   Bed, Chair, Wheelchair Transfer :   Maximal assistance   [place], PT, AMBER R - 03/30/2016 11:39 EDT   Walk/Wheelchair Score   Mode of Locomotion Goal :   Wheelchair   Type of Wheelchair Goal :   Power wheelchair   Mode of Locomotion on Discharge :   Wheelchair   Patient Ambulates :   Does not occur   Functional Independence Measure Walk :   Does not occur   Assessed Locomotion in a Wheelchair :   Yes   Distance Traveled in a Wheelchair :   150 ft   Amount of Assistance Necessary :   Patient performs less than 25% of locomotion effort   Functional Independence Measure Wheelchair :   Total assistance   [place], PT, AMBER R - 03/30/2016 11:39 EDT

## 2016-03-30 NOTE — Progress Notes (Signed)
Functional Indep Measure Scores - Text       Functional Independence Measure Scores Entered On:  03/30/2016 0:32 EDT    Performed On:  03/30/2016 0:32 EDT by Elmer RampSIOSON, RN, MARIA NORLIS               Bladder Management Score   Patient's Independence Level with Bladder Management Tasks :   Assistance   Type of Assistance Necessary :   Helper changes linen or clothing/cleans up spill, Helper changes diaper or absorbent pad   Functional Independence Measure Bladder Management :   Total assistance   SIOSON, RN, MARIA NORLIS - 03/30/2016 0:32 EDT   Bowel Management Score   Patient's Independence Level with Bowel Management Tasks :   Assistance   Type of Assistance Necessary :   Helper changes linen or clothing/cleans up spill, Helper changes diaper   Functional Independence Measure Bowel Management :   Total assistance   SIOSON, RN, MARIA NORLIS - 03/30/2016 0:32 EDT

## 2016-03-30 NOTE — Progress Notes (Signed)
Functional Indep Measure Scores - Text       Functional Independence Measure Scores Entered On:  03/30/2016 1:00 EDT    Performed On:  03/30/2016 0:59 EDT by Elmer Ramp, RN, MARIA NORLIS               Admission Information   Swallowing Status on Admission :   Regular food   SIOSON, RN, MARIA NORLIS - 03/30/2016 0:59 EDT

## 2016-03-30 NOTE — Progress Notes (Signed)
IRF-PAI Quality Indicators Adm -Text       IRF-PAI Quality Indicators Admission Entered On:  03/30/2016 1:11 EDT    Performed On:  03/30/2016 1:10 EDT by Elmer Ramp, RN, MARIA NORLIS               Section B: Hearing Speech and Vision   BB0700 Expression of Ideas and Wants :   Expresses complex messages without difficulty and with speech that is clear and easy to understand - 4   BB0800 Understanding Verbal Content :   Usually understands - Understands most conversations, but misses some part/intent of message. Requires cues at times to understand - 3   SIOSON, RN, MARIA NORLIS - 03/30/2016 1:10 EDT   Section H: Bladder and Bowel   H0350 Bladder Continence :   Always incontinent   H0400 Bowel Continence :   Always incontinent   SIOSON, RN, MARIA NORLIS - 03/30/2016 1:10 EDT   Section J: Admission Health Conditions   J1750 2 or More Fall,Fall Injury Last Yr :   No   J2000 Major Surgery 100 Days Pre Admit :   No   SIOSON, RN, MARIA NORLIS - 03/30/2016 1:10 EDT

## 2016-03-30 NOTE — Progress Notes (Signed)
 OT Inpatient Evaluation - Text       OT Inpatient Evaluation Entered On:  03/30/2016 7:21 EDT    Performed On:  03/30/2016 7:21 EDT by HOBBY, OT, LESLIE M               Reason for Treatment   Subjective Statement :   Pt agreeable to tx. I want to have my life back.    Self reported she flew by ambulance plane over night from [place]   to [place] with her husband. Daughter lives in [place].    Self reported prior to June she was independent with ADLs using her power w/c. In June, had UTI and went to hospital and AIR[facility]. Returned home and then had C-diff, rehospitalized.      HOBBY, OT, LESLIE M - 03/30/2016 16:34 EDT   *Chief Complaint :   weakness     HOBBY, OT, LESLIE M - 03/30/2016 12:21 EDT   *Reason for Referral :   Admitted for MS exacerbation    82 yo female with past medical history of progressive MS diagnosed in 1989, CVA with right sided weakness, hypothyroidism and neurogenic bladder admitted to [facility] in [place], [place]  on 03/06/16 with recurrent generalized weakness of the upper and lower extremities and poor posture. She has been wheelchair bound for the past few years due to right-sided weakness.    (per pt, she did not have a CVA)     HOBBY, OT, LESLIE M - 03/31/2016 16:13 EDT     General Information   Occupational Therapy Orders :   OT FIMS - 03/29/16 19:38:00 EDT, Daily  OT Inpatient Evaluation and Treatment Rehab - 03/29/16 19:38:00 EDT, Stop date 03/29/16 19:38:00 EDT     Precautions RTF :   Precaution Orders  Fall Risk Precautions - Ordered    -- 03/29/16 19:38:00 EDT, Stop date 03/29/16 19:38:00 EDT       HOBBY, OT, LESLIE M - 03/30/2016 7:21 EDT   Problem List   (As Of: 03/30/2016 12:32:45 EDT)   Problems(Active)    Chronic venous insufficiency (SNOMED CT  :65175981 )  Name of Problem:   Chronic venous insufficiency ; Recorder:   Giove,  Lauri B; Confirmation:   Confirmed ; Classification:   Patient Stated ; Code:   65175981 ; Contributor System:   PowerChart ; Last Updated:   03/28/2016  12:03 EDT ; Life Cycle Date:   03/28/2016 ; Life Cycle Status:   Active ; Vocabulary:   SNOMED CT        Hyperlipidemia (SNOMED CT  :07173982 )  Name of Problem:   Hyperlipidemia ; Recorder:   Giove,  Lauri B; Confirmation:   Confirmed ; Classification:   Patient Stated ; Code:   07173982 ; Contributor System:   PowerChart ; Last Updated:   03/28/2016 12:03 EDT ; Life Cycle Date:   03/28/2016 ; Life Cycle Status:   Active ; Vocabulary:   SNOMED CT        Hypothyroidism (SNOMED CT  :31731988 )  Name of Problem:   Hypothyroidism ; Recorder:   Giove,  Lauri B; Confirmation:   Confirmed ; Classification:   Patient Stated ; Code:   31731988 ; Contributor System:   PowerChart ; Last Updated:   03/28/2016 12:04 EDT ; Life Cycle Date:   03/28/2016 ; Life Cycle Status:   Active ; Vocabulary:   SNOMED CT        Multiple sclerosis (SNOMED CT  :58601984 )  Name of Problem:  Multiple sclerosis ; Recorder:   Giove,  Lauri B; Confirmation:   Confirmed ; Classification:   Patient Stated ; Code:   58601984 ; Contributor System:   PowerChart ; Last Updated:   03/28/2016 12:02 EDT ; Life Cycle Date:   03/28/2016 ; Life Cycle Status:   Active ; Vocabulary:   SNOMED CT        Neurogenic bladder (SNOMED CT  :8222367989 )  Name of Problem:   Neurogenic bladder ; Recorder:   Giove,  Lauri B; Confirmation:   Confirmed ; Classification:   Patient Stated ; Code:   8222367989 ; Contributor System:   PowerChart ; Last Updated:   03/28/2016 12:03 EDT ; Life Cycle Date:   03/28/2016 ; Life Cycle Status:   Active ; Vocabulary:   SNOMED CT        Osteoporosis (SNOMED CT  :892193986 )  Name of Problem:   Osteoporosis ; Recorder:   Giove,  Lauri B; Confirmation:   Confirmed ; Classification:   Patient Stated ; Code:   892193986 ; Contributor System:   PowerChart ; Last Updated:   03/28/2016 12:04 EDT ; Life Cycle Date:   03/28/2016 ; Life Cycle Status:   Active ; Vocabulary:   SNOMED CT        UTI (urinary tract infection) (SNOMED CT  :886118985 )  Name of  Problem:   UTI (urinary tract infection) ; Recorder:   Giove,  Lauri B; Confirmation:   Confirmed ; Classification:   Patient Stated ; Code:   886118985 ; Contributor System:   PowerChart ; Last Updated:   03/28/2016 12:03 EDT ; Life Cycle Date:   03/28/2016 ; Life Cycle Status:   Active ; Vocabulary:   SNOMED CT          Diagnoses(Active)    C. difficile colitis  Date:   03/29/2016 ; Diagnosis Type:   Discharge ; Confirmation:   Confirmed ; Clinical Dx:   C. difficile colitis ; Classification:   Medical ; Code:   ICD-10-CM ; Probability:   0 ; Diagnosis Code:   A04.7      Chronic constipation  Date:   03/29/2016 ; Diagnosis Type:   Discharge ; Confirmation:   Confirmed ; Clinical Dx:   Chronic constipation ; Classification:   Medical ; Code:   ICD-10-CM ; Probability:   0 ; Diagnosis Code:   K59.09      Chronic venous insufficiency  Date:   03/29/2016 ; Diagnosis Type:   Discharge ; Confirmation:   Confirmed ; Clinical Dx:   Chronic venous insufficiency ; Classification:   Medical ; Code:   ICD-10-CM ; Probability:   0 ; Diagnosis Code:   I87.2      Hypothyroidism  Date:   03/29/2016 ; Diagnosis Type:   Discharge ; Confirmation:   Confirmed ; Clinical Dx:   Hypothyroidism ; Classification:   Medical ; Code:   ICD-10-CM ; Probability:   0 ; Diagnosis Code:   E03.9      Ileus  Date:   03/29/2016 ; Diagnosis Type:   Discharge ; Confirmation:   Confirmed ; Clinical Dx:   Ileus ; Classification:   Medical ; Code:   ICD-10-CM ; Probability:   0 ; Diagnosis Code:   K56.7      Multiple sclerosis  Date:   03/29/2016 ; Diagnosis Type:   Discharge ; Confirmation:   Confirmed ; Clinical Dx:   Multiple sclerosis ; Classification:   Medical ; Code:  ICD-10-CM ; Probability:   0 ; Diagnosis Code:   G35      Neurogenic bladder  Date:   03/29/2016 ; Diagnosis Type:   Discharge ; Confirmation:   Confirmed ; Clinical Dx:   Neurogenic bladder ; Classification:   Medical ; Code:   ICD-10-CM ; Probability:   0 ; Diagnosis Code:   N31.9      UTI  (urinary tract infection)  Date:   03/29/2016 ; Diagnosis Type:   Discharge ; Confirmation:   Confirmed ; Clinical Dx:   UTI (urinary tract infection) ; Classification:   Medical ; Code:   ICD-10-CM ; Probability:   0 ; Diagnosis Code:   N39.0        Home Environment   Living Environment :   Home Environment  *ADL:  Assist needed  Performed By:  LORELLA KLEIN, AMBER R 03/30/2016  *Mobility:  Independent  Performed By:  LORELLA KLEIN, AMBER R 03/30/2016  Kitchen:  1st floor  Performed By:  LORELLA KLEIN, AMBER R 03/30/2016  Laundry:  1st floor  Performed By:  LORELLA KLEIN, AMBER R 03/30/2016  Lives In:  Single level home  Performed By:  LORELLA KLEIN, AMBER R 03/30/2016  Lives With:  Family, Spouse  Performed By:  LORELLA KLEIN, AMBER R 03/30/2016  Living Situation:  Home with family support  Performed By:  LORELLA KLEIN, AMBER R 03/30/2016  Patient's Responsibilities:  Leisure/Play/Hobbies, Personal ADL, Social participation  Performed By:  LORELLA KLEIN, AMBER R 03/30/2016  Primary Bathroom:  1st floor  Performed By:  LORELLA KLEIN, AMBER R 03/30/2016  Primary Bedroom:  1st floor  Performed By:  LORELLA KLEIN, AMBER R 03/30/2016  Devices/Equipment at Home:  Wheelchair  Performed By:  LAMON RN, MARIA NORLIS 03/29/2016  Professional Skilled Services:  Occupational Therapy, Physical Therapy  Performed By:  LAMON, RN, MARIA NORLIS 03/29/2016     Living Situation :   Home with family support   Lives With :   Family, Spouse   Lives In :   Single level home   Prior Accessibility Options :   Bathroom modifications   HOBBY, OT, LESLIE M - 03/30/2016 12:21 EDT   Home Setup Grid   Primary Bedroom :   1st floor   Primary Bathroom :   1st floor   Kitchen :   1st floor   Laundry :   1st floor   HOBBY, OT, LESLIE M - 03/30/2016 12:21 EDT   Patient's Responsibilities Rehab :   Leisure/Play/Hobbies, Personal ADL, Social participation   Detail Areas of Responsibilities :   lives in [place] with husband in [place] . Has a power w/c. Does not drive. Has shower chair  and BSC.   HOBBY, OT, LESLIE M - 03/30/2016 12:21 EDT   Home Environment II   Living Environment :   Home Environment  Devices/Equipment at Home:  Wheelchair  Performed By:  LAMON, RN, MARIA NORLIS 03/29/2016  Living Situation:  Home with family support  Performed By:  LAMON PEAK, MARIA NORLIS 03/29/2016  Professional Skilled Services:  Occupational Therapy, Physical Therapy  Performed By:  LAMON, RN, MARIA NORLIS 03/29/2016  Kitchen:  1st floor  Performed By:  Karen Janann NOVAK 03/28/2016  Laundry:  1st floor  Performed By:  Karen Janann B 03/28/2016  Lives In:  Single level home  Performed By:  Karen Janann NOVAK 03/28/2016  Lives With:  Family, Spouse  Performed By:  Karen Janann NOVAK 03/28/2016  Patient's Responsibilities:  Leisure/Play/Hobbies, Personal ADL, Social participation  Performed By:  Karen Penner B 03/28/2016  Primary Bathroom:  1st floor  Performed By:  Karen Penner B 03/28/2016  Primary Bedroom:  1st floor  Performed By:  Karen Penner NOVAK 03/28/2016     Devices/Equipment at Home :   Commode (3 in 1) BSC, Grab bars, Other: shower chair   HOBBY, OT, LESLIE M - 03/30/2016 12:21 EDT   Prior Functional Status Grid   ADL :   Assist needed   Mobility :   Independent   Instrumental ADL :   Assist needed   Cognitive-Communication Skills :   Independent   HOBBY, OT, LESLIE M - 03/30/2016 12:21 EDT   Additional Information :   mobility independent from PWC level   HOBBY, OT, LESLIE M - 03/30/2016 12:21 EDT   OT Basic ADL   Basic ADL Grid   Eating :   Minimal contact assistance   Grooming :   Moderate assistance   Bathing :   Total assistance   UE Dressing :   Maximal assistance   LE Dressing :   Total assistance   Toileting :   Total assistance   Transfer Toilet :   Total assistance   Tub Transfer :   Does not occur   Shower Transfer :   Does not occur   HOBBY, OT, LESLIE M - 03/30/2016 12:21 EDT   ADL Comments :   Eating: min a to open packages, cut foot, occasional help to bring food to mouth  Grooming: assist to  open containers, unable to open toothpaste lid, assist to brush hair and wash hands  Bathing: Req'd 2 people assist for bed mobility to wash bottom, washed LB at bedlevel, UB in chair, assist to wash BUE, able to wash chest/face independently only  LB dressing: 2 people assist for rolling  Ub dressing: assist to thread RUE, to pull over head, to pull around back  Toileting: 2 people assist, incontinent of bladder  Toilet transfer: 2 people assist, hoyer lift  Transferred bed-->w/c with 2 people assist, hoyer lift      Limiting Factors :   Motor   HOBBY, OT, LESLIE M - 03/30/2016 12:21 EDT   Sitting Balance Assessment   Sitting Surface Evaluated Upon :   Bed   HOBBY, OT, LESLIE M - 03/30/2016 12:21 EDT   Static Sitting Balance Assessment Grid   Sits Without UE Support :   Rehab Maximal assistance   Sits With One UE Support :   Rehab Maximal assistance   Sits With Two UE Support :   Rehab Maximal assistance   HOBBY, OT, LESLIE M - 03/30/2016 12:21 EDT   Dynamic Sitting Balance Assessment Grid   Anterior Shift :   Unable   Posterior Shift :   Unable   Lateral to the Left Shift :   Unable   Lateral to the Right Shift :   Unable   HOBBY, OT, LESLIE M - 03/30/2016 12:21 EDT   Righting Reactions Grid   Left Protective Reactions :   Delayed   Right Protective Reactions :   Delayed   Left Righting Reactions :   Delayed   Right Righting Reactions :   Delayed   HOBBY, OT, LESLIE M - 03/30/2016 12:21 EDT   Cognition   Attention Cognition Grid   Sustained :   Within functional limits   Alternating :   Within functional limits   Divided :  Within functional limits   Attention to Detail :   Within functional limits   HOBBY, OT, LESLIE M - 03/30/2016 12:21 EDT   Memory Cognition Grid   Encoding :   Impaired   Retrieval :   Impaired   Prospective :   Impaired   Procedural :   Impaired   Storage :   Impaired   HOBBY, OT, LESLIE M - 03/30/2016 12:21 EDT   Executive Function Cognition Grid   Initiation :   Impaired   Planning and  Organization :   Within functional limits   Problem Solving Skills :   Impaired   HOBBY, OT, LESLIE M - 03/30/2016 12:21 EDT   Cognition Indep Measure   Comprehension Indep Measure Interim :   Complete independence   Comprehension Mode Indep Measure :   Auditory, Visual   Expression Indep Measure Interim :   Complete independence   Expression Mode Indep Measure :   Vocal   Social Interaction Indep Measure Interim :   Complete independence   Problem Solving Indep Measure Interim :   Standby prompting   Memory Indep Measure Interim :   Standby prompting   HOBBY, OT, LESLIE M - 03/30/2016 12:21 EDT   Vision/Perception   Vision Status :   Within functional limits   Perception Status :   Other: reported no changes in vision and no hx of visual blurriness/has not seen opthamologist to evaluate vision related to MS   Glasses :   Yes   HOBBY, OT, LESLIE M - 03/30/2016 12:21 EDT   Vision/Perception Assessment Grid   Convergence :   Impaired   (Comment: would benefit from further assessment [HOBBY, OT, LESLIE M - 03/30/2016 16:34 EDT] )   HOBBY, OT, LESLIE M - 03/30/2016 12:21 EDT   UE ROM/Strength   Lt Upper Extremity Strength :   Other: hx of rotator cuff tear   HOBBY, OT, LESLIE M - 03/30/2016 12:21 EDT   Left Upper Extremity Strength Grid   Shoulder Flexion :   2-   Shoulder Extension :   2-   Elbow Flexion :   3+   Elbow Extension :   3+   Wrist Flexion :   4   Wrist Extension :   4   Finger Flexion :   3+   HOBBY, OT, LESLIE M - 03/30/2016 12:21 EDT   Rt Upper Extremity Strength :   Other: hx of ulnar nerve surgery   HOBBY, OT, LESLIE M - 03/30/2016 12:21 EDT   Right Upper Extremity Strength Grid   Shoulder Flexion :   1   Shoulder Extension :   1   Elbow Flexion :   2   Elbow Extension :   2   Wrist Flexion :   2   Wrist Extension :   2   Finger Flexion :   2-   HOBBY, OT, LESLIE M - 03/30/2016 12:21 EDT   UE Coordination   Mean Age Gender Lt 9 Hole Peg Test :   24.11   Right Mean for Age/Gender :   22.49   HOBBY, OT, LESLIE M  - 03/30/2016 12:21 EDT   Left Upper Extremity Coordination Grid   Finger to Nose :   Impaired   Finger Opposition :   Impaired   HOBBY, OT, LESLIE M - 03/30/2016 12:21 EDT   Right Upper Extremty Coordination Grid   Finger to Nose :   Impaired   Finger  Opposition :   Impaired   HOBBY, OT, LESLIE M - 03/30/2016 12:21 EDT   UE Sensation   Left Sensation Grid   Light Touch :   Impaired   Sharp/Dull :   Impaired   HOBBY, OT, LESLIE M - 03/30/2016 12:21 EDT   Right Sensation Grid   Light Touch :   Impaired   Sharp/Dull :   Impaired   HOBBY, OT, LESLIE M - 03/30/2016 12:21 EDT   Impact of Impaired UE Sensation :   numbness/tingling in BUE   HOBBY, OT, LESLIE M - 03/30/2016 12:21 EDT   UE Function   Previous Hand Dominance :   Right   Current Hand Dominance :   Left   HOBBY, OT, LESLIE M - 03/30/2016 12:21 EDT   UE Tone         remainder (less than 1/2 of ROM)   Left Upper Extremity :   Normal   Right Upper Extremity :   Hypotonic   HOBBY, OT, LESLIE M - 03/30/2016 12:21 EDT   Education   Responsible Learner Present for Session :   No   Home Caregiver Name/Relationship :   Sim Mauri-spouse   Barriers To Learning :   Acuity of illness, Emotional state   Teaching Method :   Demonstration, Explanation   HOBBY, OT, LESLIE M - 03/30/2016 12:21 EDT   Occupational Therapy Education Grid   Activity of Daily Living Training :   Verbalizes understanding, Needs further teaching, Needs practice/supervision   Bed Positioning :   Verbalizes understanding, Needs further teaching, Needs practice/supervision   Bed to Chair Transfers :   Verbalizes understanding, Needs further teaching, Needs practice/supervision   Home Safety :   Verbalizes understanding, Needs further teaching, Needs practice/supervision   Plan of Care :   Needs practice/supervision, Needs further teaching, Verbalizes understanding   Skin Care :   Verbalizes understanding, Needs further teaching, Needs practice/supervision   HOBBY, OT, LESLIE M - 03/30/2016 12:21 EDT    Assessment   OT Impairments or Limitations :   Balance deficits, Basic activity of daily living deficits, Coordination deficits, Endurance deficits, Equipment training, IADL deficits, Mobility deficits, Proprioception deficits, Safety awareness deficits, Strength deficits   OT Discharge Recommendations :   ELOS 3 weeks then home with spouse and power w/c     OT Treatment Recommendations :   previously R handed, however since ulnar nerve surgery uses LUE for more functl tasks.      HOBBY, OT, LESLIE M - 03/30/2016 12:21 EDT   Long Term Goals   OT Patient/Caregiver Goal :   To get stronger, to be independent   HOBBY, OT, LESLIE M - 03/30/2016 16:34 EDT   OT IP Long Term Goals Grid     Long Term Goal 1  Long Term Goal 2        Goal :    Pt will perform simple meal prep activity with min assist to improve indep in ADLs   Pt will improve BUE strength to 3+/5 to improve indep in ADLS           Status :    Initial   Initial             HOBBY, OT, LESLIE M - 03/30/2016 16:34 EDT  HOBBY, OT, LESLIE M - 03/30/2016 16:34 EDT        ADL Long Term Goals Grid   Eating Goal :   Complete independence   Grooming Goal :  Modified independence   Bathing Goal :   Minimal contact assistance   Upper Extremity Dressing :   Minimal contact assistance   Lower Body Dressing Goal :   Minimal contact assistance   Toileting Goal :   Minimal contact assistance   Toilet Transfer Goal :   Minimal contact assistance   HOBBY, OT, LESLIE M - 03/30/2016 16:34 EDT   OT LTG Reconcilation :   Shower goal TBD. Goals may be upgraded pending progress in therapy.   Comprehension Goal Indep Measure :   Complete independence   Comprehension Mode Goal :   Auditory, Visual   Expression Goal Indep Measure :   Complete independence   Expression Mode Goal :   Vocal   Social Interaction Goal Indep Measure :   Complete independence   Problem Solving Goal Indep Measure :   Modified independence   Memory Goal Indep Measure :   Modified independence   OT LT Goals Reviewed  :   Yes   HOBBY, OT, LESLIE M - 03/30/2016 16:34 EDT   Short Term Goals   Eating Goal Grid     Goal #1          Activity :    Manage food containers/packages              Assist :    Distant supervision              Status :    Initial                HOBBY, OT, LESLIE M - 03/30/2016 16:34 EDT         Grooming Goal Grid     Goal #1          Activity :    Grooming routine              Descriptors :    Supported short sit, Unsupported short sit, Wheelchair              Assist :    Contact guard assistance              Status :    Initial                HOBBY, OT, LESLIE M - 03/30/2016 16:34 EDT         Upper Body Dressing Short Term Goal Grid     Goal #1          Activity :    Upper extremity dressing              Assist :    Moderate assistance              Status :    Initial                HOBBY, OT, LESLIE M - 03/30/2016 16:34 EDT         Lower Body Dressing Grid     Goal #1          Activity :    Lower extremity dressing              Lower Body Dressing Descriptors :    Supine, Supported short sit, Unsupported short sit              Assist :    Maximal assistance              Status :    Initial  HOBBY, OT, LESLIE M - 03/30/2016 16:34 EDT         Bathing Goal Grid     Goal #1          Activity :    Bathe              Descriptors :    Supported short sit, Unsupported short sit, Wheelchair              Assist :    Maximal assistance              Status :    Initial                HOBBY, OT, LESLIE M - 03/30/2016 16:34 EDT         Toileting and Transfers Goal Grid     Goal #1  Goal #2        Activity :    Toilet transfers   Toileting           Assist :    Maximal assistance   Maximal assistance           Equipment :    Bedside commode              Transfer Equipment :    Drop arm commode, Elevated toilet seat, Grab bars, Sliding board   Drop arm commode, Elevated toilet seat, Grab bars, Sliding board           Status :    Initial   Initial             HOBBY, OT, LESLIE M - 03/30/2016 16:34 EDT  HOBBY, OT,  LESLIE M - 03/30/2016 16:34 EDT        OT Balance Goal Grid     Goal #1          Type :    Static sitting              Descriptors :    Unsupported short sit              Assist :    Contact guard assistance              Length of Time (minutes) :    5 minutes              Rationale :    Improve independence with activities of daily living              Status :    Initial                HOBBY, OT, LESLIE M - 03/30/2016 16:34 EDT         OT ST Goals Reviewed :   Yes   HOBBY, OT, LESLIE M - 03/30/2016 16:34 EDT   Plan   Frequency :   Daily   Duration :   3    Duration Unit :   Weeks   Estimated Hours Per Day :   1-2 hrs per day   Planned Treatments :   Balance training, Basic Activities of Daily Living, Caregiver training, Coordination, Energy conservation training, Equipment training, Group therapy, Mobility training, Patient education, Safety education, Therapeutic activities, Therapeutic exercises, Therapeutic exercises for strengthening and ROM   Treatment Plan/Goals Established With Patient/Caregiver :   Yes   HOBBY, OT, LESLIE M - 03/30/2016 16:34 EDT   Time Spent With Patient   OT Evaluation Units, High Complexity :  1 Unit   OT Individual Eval Time, High Complexity :   15 minutes   OT ADL TRAINING 15 MIN :   4    OT ADL Training Minutes :   60 minutes   OT Total Individual Therapy Time :   75 minutes   OT Total Timed Code Treatment Units :   4 units   OT Total Timed Code Treatment Minutes :   60 minutes   OT Total Untimed Code Treatment Minutes :   15 minutes   OT Total Treatment Time Rehab :   75 minutes   HOBBY, OT, LESLIE M - 03/30/2016 16:34 EDT   OT Time In :   7:30 EST   OT Time Out :   8:45 EST   HOBBY, OT, LESLIE M - 03/30/2016 12:21 EDT   Care Tool Section GG: Self Care Functional Abilities   OT Care Tool Progress :   Partial/Moderate assistance - Helper does LESS THAN HALF the effort. Helper lifts, holds or supports trunk or limbs, but provides less than half the effort. - 03   GG0130 Oral Hygiene :    Supervision or touching assistance - Helper provides VERBAL CUES or TOUCHING/STEADYING assistance as patient/resident completes activity. Assistance may be provided throughout the activity or intermittently. - 04   GG0130 Toileting Hygiene :   Dependent - Helper does ALL of the effort. Patient/Resident does none of the effort to complete the activity or the assistance of 2 or more helpers is required for the patient/resident to complete the activity. - 01   GG0170 Toilet Transfer :   Dependent - Helper does ALL of the effort. Patient/Resident does none of the effort to complete the activity or the assistance of 2 or more helpers is required for the patient/resident to complete the activity. - 01   GG0130 Shower, Bathe Self :   Dependent - Helper does ALL of the effort. Patient/Resident does none of the effort to complete the activity or the assistance of 2 or more helpers is required for the patient/resident to complete the activity. - 01   GG0130 Upper Body Dressing :   Substantial/Maximal assistance - Helper does MORE THAN HALF the effort. Helper lifts or holds trunk or limbs and provides more than half the effort. - 02   GG0130 Lower Body Dressing :   Dependent - Helper does ALL of the effort. Patient/Resident does none of the effort to complete the activity or the assistance of 2 or more helpers is required for the patient/resident to complete the activity. - 01   GG0130 Putting On, Taking Off Footwear :   Dependent - Helper does ALL of the effort. Patient/Resident does none of the effort to complete the activity or the assistance of 2 or more helpers is required for the patient/resident to complete the activity. - 01   HOBBY, OT, LESLIE M - 03/30/2016 12:21 EDT   Care Tool Section GG: Self-Care Goals   Self-Care Goals Grid   GG0170 Toilet Transfer Goal :   Supervision or touching assistance - Helper provides VERBAL CUES or TOUCHING/STEADYING assistance as patient/resident completes activity. Assistance may be  provided throughout the activity or intermittently. - 04   HOBBY, OT, LESLIE M - 03/30/2016 12:21 EDT   Section C: Cognitive Patterns   Patient Makes Self Understood, Verbally or in Writing :   Understood   Words Patient Repeated :   Sock, Blue, Bed   What Year Is It Right Now :  2017   Accuracy of Year Response :   Correct   What Month Is It Right Now :   September   Accuracy of Month Response :   Accurate within 5 days   What Day of the Week Is It :   Saturday   Accuracy of Day of Week Response :   Correct   BIMS Able to Recall Sock :   Yes, no cue required   BIMS Able to Recall Blue :   Yes, no cue required   BIMS Able to Recall Bed :   Yes, no cue required   BIMS Summary Score :   15    HOBBY, OT, LESLIE M - 03/30/2016 12:21 EDT

## 2016-03-30 NOTE — Progress Notes (Signed)
 PT Inpatient Examination - Text       PT Inpatient Evaluation Entered On:  03/30/2016 11:04 EDT    Performed On:  03/30/2016 9:30 EDT by LORELLA, PT, AMBER R               Reason for Treatment   *Reason for Referral :   Progressive MS (dx 1989)  new weakness following UTI and C-diff    3 hrs/day     [place], PT, AMBER R - 03/30/2016 10:57 EDT   Home Environment   Living Environment :   Home Environment  Devices/Equipment at Home:  Wheelchair  Performed By:  LAMON, RN, MARIA NORLIS 03/29/2016  Living Situation:  Home with family support  Performed By:  LAMON PEAK, MARIA NORLIS 03/29/2016  Professional Skilled Services:  Occupational Therapy, Physical Therapy  Performed By:  LAMON, RN, MARIA NORLIS 03/29/2016  Kitchen:  1st floor  Performed By:  Karen Penner B 03/28/2016  Laundry:  1st floor  Performed By:  Karen Penner B 03/28/2016  Lives In:  Single level home  Performed By:  Karen Penner B 03/28/2016  Lives With:  Family, Spouse  Performed By:  Karen Penner B 03/28/2016  Patient's Responsibilities:  Leisure/Play/Hobbies, Personal ADL, Social participation  Performed By:  Karen Penner B 03/28/2016  Primary Bathroom:  1st floor  Performed By:  Karen Penner B 03/28/2016  Primary Bedroom:  1st floor  Performed By:  Karen Penner B 03/28/2016     Living Situation :   Home with family support   Lives With :   Family, Spouse   Lives In :   Single level home   [place], PT, AMBER R - 03/30/2016 10:57 EDT   Home Setup   Primary Bedroom :   1st floor   Primary Bathroom :   1st floor   Kitchen :   1st floor   Laundry :   1st floor   [place], PT, AMBER R - 03/30/2016 10:57 EDT   Patient's Responsibilities :   Leisure/Play/Hobbies, Personal ADL, Social participation   [place], PT, AMBER R - 03/30/2016 10:57 EDT   Home Environment II   Living Environment :   Home Environment  Devices/Equipment at Home:  Wheelchair  Performed By:  LAMON, RN, MARIA NORLIS 03/29/2016  Living Situation:  Home with family support  Performed By:  LAMON  RN, MARIA NORLIS 03/29/2016  Professional Skilled Services:  Occupational Therapy, Physical Therapy  Performed By:  LAMON, RN, MARIA NORLIS 03/29/2016  Kitchen:  1st floor  Performed By:  Karen Penner B 03/28/2016  Laundry:  1st floor  Performed By:  Karen Penner B 03/28/2016  Lives In:  Single level home  Performed By:  Karen Penner B 03/28/2016  Lives With:  Family, Spouse  Performed By:  Karen Penner B 03/28/2016  Patient's Responsibilities:  Leisure/Play/Hobbies, Personal ADL, Social participation  Performed By:  Karen Penner B 03/28/2016  Primary Bathroom:  1st floor  Performed By:  Karen Penner B 03/28/2016  Primary Bedroom:  1st floor  Performed By:  Karen Penner NOVAK 03/28/2016     [place], PT, AMBER R - 03/30/2016 10:57 EDT   Prior Functional Status   ADL :   Assist needed   Mobility :   Independent   [place], PT, AMBER R - 03/30/2016 10:57 EDT   Additional Information :   mobility independent from PWC level   [place], PT, AMBER  R - 03/30/2016 10:57 EDT   LE ROM/Strength   Left Lower Extremity Strength Grid   Hip Flexion :   2-   Hip Extension :   2-   Hip Abduction :   2-   Knee Flexion :   2-   Knee Extension :   3-   Ankle Dorsiflexion :   3-   Ankle Plantarflexion :   3-   [place], PT, AMBER R - 03/30/2016 10:57 EDT   Right Lower Extremity Strength Grid   Hip Flexion :   0   Hip Extension :   1   Hip Abduction :   0   Knee Flexion :   1   Knee Extension :   1   Ankle Dorsiflexion :   1   Ankle Plantarflexion :   1   [place], PT, AMBER R - 03/30/2016 10:57 EDT   LE/Trunk Tone   Left Lower Extremity Tone   Left Lower Extremity :   Hypertonic   Right Lower Extremity :   Hypertonic   [place], PT, AMBER R - 03/30/2016 10:57 EDT   UE ROM/Strength   Left Upper Extremity Strength Grid   Shoulder Flexion :   2-   Shoulder Extension :   2-   Elbow Flexion :   3+   Elbow Extension :   3+   Wrist Flexion :   4   Wrist Extension :   4   Finger Flexion :   3+   [place], PT, AMBER R - 03/30/2016 10:57 EDT   Right Upper  Extremity Strength Grid   Shoulder Flexion :   1   Shoulder Extension :   1   Elbow Flexion :   2   Elbow Extension :   2   Wrist Flexion :   2   Wrist Extension :   2   Finger Flexion :   2-   [place], PT, AMBER R - 03/30/2016 10:57 EDT   UE Tone         remainder (less than 1/2 of ROM)   Right Upper Extremity :   Hypotonic   [place], PT, AMBER R - 03/30/2016 10:57 EDT   Sensation   Left Upper Extremity Sensation   Light Touch :   Impaired   Sharp/Dull :   Impaired   [place], PT, AMBER R - 03/30/2016 10:57 EDT   Right Upper Extremity Sensation   Light Touch :   Impaired   Sharp/Dull :   Impaired   [place], PT, AMBER R - 03/30/2016 10:57 EDT   Left Lower Extremity Sensation   Light Touch :   Impaired   Sharp/Dull :   Impaired   [place], PT, AMBER R - 03/30/2016 10:57 EDT   Right Lower Extremity Sensation   Light Touch :   Impaired   Sharp/Dull :   Impaired   [place], PT, AMBER R - 03/30/2016 10:57 EDT   LE Coordination   Trace Square Left Lower Extremity   Trace Square :   Impaired   Toe Taps :   Impaired   Heel to Shin :   Impaired   [place], PT, AMBER R - 03/30/2016 10:57 EDT   Right Lower Extremity Coordination   Trace Square :   Impaired   Toe Taps :   Impaired   Heel to Shin :   Impaired   [place], PT, AMBER R - 03/30/2016 10:57 EDT   Sitting Balance  Static Sitting Balance Assessment Grid   Sits Without UE Support :   Rehab Maximal assistance   Sits With One UE Support :   Rehab Maximal assistance   Sits With Two UE Support :   Rehab Maximal assistance   [place], PT, AMBER R - 03/30/2016 10:57 EDT   Sitting Surface Evaluated Upon :   Bed   [place], PT, AMBER R - 03/30/2016 10:57 EDT   Dynamic Sitting Balance Assessment Grid   Anterior Shift :   Unable   Posterior Shift :   Unable   Lateral to the Left Shift :   Unable   Lateral to the Right Shift :   Unable   [place], PT, AMBER R - 03/30/2016 10:57 EDT   Righting Reactions Grid   Left Protective Reactions :   Delayed   Right Protective Reactions :   Delayed   Left Righting  Reactions :   Delayed   Right Righting Reactions :   Delayed   [place], PT, AMBER R - 03/30/2016 10:57 EDT   PT Mobility   Mobility Grid   Roll Left :   Rehab Maximal assistance   Supine to Sit :   Rehab Maximal assistance   Sit to Supine :   Rehab Maximal assistance   Transfer Bed to and From Chair :   Rehab Maximal assistance   (Comment: 2nd person to [employer], PT, AMBER R - 03/30/2016 11:34 EDT] )   LORELLA, PT, AMBER R - 03/30/2016 10:57 EDT   Amb Ability Varied Surf/Distraction Grid   Level Surfaces :   Does not occur   Uneven Surfaces :   Does not occur   Distracting Environments :   Does not occur   Curbs :   Does not occur   Stairs :   Does not occur   Ramp :   Does not occur   [place], PT, AMBER R - 03/30/2016 10:57 EDT   Transfer Type :   Transfer board   PT Mobility Reviewed :   Yes   [place], PT, AMBER R - 03/30/2016 10:57 EDT   PT WC Management   Type of Wheelchair :   Manual wheelchair   Wheelchair Details :   dependent due to manual tilt in space WC   [place], PT, AMBER R - 03/30/2016 10:57 EDT   Wheelchair Mobility Grid   Level Surfaces :   Rehab Total assistance   [place], PT, AMBER R - 03/30/2016 10:57 EDT   Wheelchair Mobility Reviewed :   Chaney LORELLA, PT, AMBER R - 03/30/2016 10:57 EDT   Assessment   Discharge Recommendations :   ELOS: 3 weeks; home with assist and PWC     PT Treatment Recommendations :   Pt is a 82 year old female with progressive MS that has had a decline in function following UTI and c-diff. She was currently independnet with transfers from [facility] level and occasionally required assist for ADLs. She will benefit from skilled PT to address above impairments and progress towards more independence with functional mobility and transfers to power wheelchair.      [place], PT, AMBER R - 03/30/2016 11:34 EDT   PT Impairments or Limitations :   Abnormal tone, Balance deficits, Bed mobility deficits, Coordination/Proprioception deficits, Endurance deficits, Equipment training, Impaired  sensation, Range of motion deficits, Strength deficits, Transfer deficits, Transition deficits, Wheelchair mobility deficits   Barriers to Safe Discharge PT :   Medical diagnosis   [place],  PT, AMBER R - 03/30/2016 10:57 EDT   Short Term Goals   Bed Mobility Goal Grid     Goal #1          Descriptors :    Roll to right and left              Level :    Moderate assistance              Status :    Initial                [place], PT, AMBER R - 03/30/2016 11:34 EDT         Transfers Goal Grid     Goal #1          Descriptors :    Transfer board              Level :    Moderate assistance              Status :    Initial                [place], PT, AMBER R - 03/30/2016 11:34 EDT         W/C Management Grid     Goal #1  Goal #2        Descriptors :    Power wheelchair mobility indoors   Pressure relief tilt in space (power wheelchair)           Level :    Minimal assistance   Minimal assistance           Status :    Initial   Initial             [place], PT, AMBER R - 03/30/2016 11:34 EDT  [place], PT, AMBER R - 03/30/2016 11:34 EDT        PT Balance Goal Grid     Goal #1          Descriptor :    Unsupported short sit              Assist Level :    Moderate assistance              Type :    Static sitting              Length of Time (minutes) :    2 minutes              Rationale :    Improve independence with activities of daily living                [place], PT, AMBER R - 03/30/2016 11:34 EDT         PT ST Goals Reviewed :   Chaney MULBERRY, PT, AMBER R - 03/30/2016 11:34 EDT   Long Term Goals   PT Patient,Caregiver Goal :   To get back to being independent   [place], PT, AMBER R - 03/30/2016 11:34 EDT   Outpatient PT Long Term Goals Rehab     Long Term Goal 1          Goal :    Pt will complete car transfer with mod A in 3 weeks              Status :    Initial                [place], PT, AMBER R - 03/30/2016 11:34 EDT  Mobility Goals Grid   Bed, Chair, Wheelchair Goal :   Minimal contact assistance   Wheelchair Mobility Level  Surfaces Goal :   Modified independence   [place], PT, AMBER R - 03/30/2016 11:34 EDT   PT LTG Reconcilation :   LTG to be met in 3 weeks   Type of Wheelchair Goal :   Power wheelchair   Mode of Locomotion Goal :   Wheelchair   PT LT Goals Reviewed :   Yes   [place], PT, AMBER R - 03/30/2016 11:34 EDT   Plan   Frequency :   Daily   Duration :   3    PT Duration Unit Rehab :   Weeks   Treatments Planned :   Balance training, Basic activities of daily living, Bed mobility training, Caregiver training, Electrical stimulation, Equipment training, Functional training, Manual therapy, Neuromuscular reeducation, Pain management, Posture/Body mechanics training, Patient education, Soft tissue massage/MFR, Therapeutic activities, Therapeutic exercises, Wheelchair assessment and management   Treatment Plan/Goals Established With Patient/Caregiver :   Yes   Other PT Treatment Provided :   Adjustments made to tilt in space WC for optimal positioning for midline posture and pressure relief.   Evaluation Complete :   Yes   [place], PT, AMBER R - 03/30/2016 10:57 EDT   Time Spent With Patient   PT Individual Eval Time, Moderate Complexity :   45 minutes   PT Evaluation Units, Moderate Complexity :   1 Unit   PT Wheelchair Management Units :   3 units   PT Wheelchair Management Time :   45 minutes   PT Total Individual Therapy Time :   90 minutes   PT Total Timed Code Treatment Units :   3 units   PT Total Timed Code Tx Minutes :   45 minutes   PT Total Untimed Code Treatment Minutes :   45 minutes   PT Total Treatment Time Rehab :   90 minutes   [place], PT, AMBER R - 03/30/2016 11:34 EDT   PT Time In :   9:30 EST   PT Time Out :   11:00 EST   [place], PT, AMBER R - 03/30/2016 10:57 EDT   Care Tool Section GG: Admission Mobility Functional Abilities   GG0170 Sit to Lying :   Substantial/Maximal assistance - Helper does MORE THAN HALF the effort. Helper lifts or holds trunk or limbs and provides more than half the effort. - 02   [place], PT,  AMBER R - 04/04/2016 12:59 EDT   GG0170 Roll Left and Right :   Substantial/Maximal assistance - Helper does MORE THAN HALF the effort. Helper lifts or holds trunk or limbs and provides more than half the effort. - 02   GG0170 Lying to Sitting on Side of Bed :   Substantial/Maximal assistance - Helper does MORE THAN HALF the effort. Helper lifts or holds trunk or limbs and provides more than half the effort. - 02   GG0170 Sit to Stand :   Not applicable - 09   GG0170 Chair,Bed to Chair Transfer :   Substantial/Maximal assistance - Helper does MORE THAN HALF the effort. Helper lifts or holds trunk or limbs and provides more than half the effort. - 02   GG0170 Car Transfer :   Not attempted due to medical condition or safety concerns - 88   GG0170 Patient Walk :   No, and walking goal IS clinically indicated   [place], PT, AMBER R -  03/30/2016 11:34 EDT   Care Tool Section GG: Mobility Continued   GG0170 Patient Use Wheelchair,Scooter :   Yes   GG0170 Wheel 50 Feet with Two Turns :   Dependent - Helper does ALL of the effort. Patient/Resident does none of the effort to complete the activity or the assistance of 2 or more helpers is required for the patient/resident to complete the activity. - 01   GG0170 Type Wheelchair,Scooter Use 40ft :   Manual wheelchair   GG0170 Wheel 150 feet :   Dependent - Helper does ALL of the effort. Patient/Resident does none of the effort to complete the activity or the assistance of 2 or more helpers is required for the patient/resident to complete the activity. - 01   GG0170 Type Wheelchair,Scooter Use 133ft :   Manual wheelchair   [place], PT, AMBER R - 03/30/2016 11:34 EDT   Care Tool Section GG: Mobility Goals   Mobility Goal Grid   GG0170 Chair,Bed to Chair Transfer Goal :   Supervision or touching assistance - Helper provides VERBAL CUES or TOUCHING/STEADYING assistance as patient/resident completes activity. Assistance may be provided throughout the activity or intermittently. - 04    [place], PT, AMBER R - 03/30/2016 11:34 EDT

## 2016-03-30 NOTE — Progress Notes (Signed)
SLP Inpatient Comm Evaluation - Text       SLP Inpatient Communication Evaluation Entered On:  03/30/2016 16:10 EDT    Performed On:  03/30/2016 15:52 EDT by Graciella Belton, SLP, HEATHER L               Reason for Treatment   Subjective Statement :   Pt states came here to get figured out; was totally independent 3 months ago.       *Reason for Referral :   Pt to be evaluated 2* MS flare; ? cognitive deficits.     *Chief Complaint :   Pt reports some difficulties w/ memory over the past few years, but unsure if MS or age related.      Graciella Belton, SLP, HEATHER L - 03/30/2016 15:52 EDT   General Information   Speech Orders :   SLP Inpatient Communication Evaluation and Treatment Rehab - 03/29/16 19:38:00 EDT, Stop date 03/29/16 19:38:00 EDT     Precautions :   Precaution Orders  Fall Risk Precautions - Ordered    -- 03/29/16 19:38:00 EDT, Stop date 03/29/16 19:38:00 EDT       Affect/Behavior :   Appropriate, Cooperative   Pain Interfering   With Session :   No   Cultural/Spiritual Beliefs to Incorporate Into Treatment Sessions :   No   YAGER, SLP, HEATHER L - 03/30/2016 15:52 EDT   Problem List   (As Of: 03/30/2016 16:10:10 EDT)   Problems(Active)    Chronic venous insufficiency (SNOMED CT  :19147829 )  Name of Problem:   Chronic venous insufficiency ; Recorder:   Giove,  Lauri B; Confirmation:   Confirmed ; Classification:   Patient Stated ; Code:   [id number] ; Contributor System:   Dietitian ; Last Updated:   03/28/2016 12:03 EDT ; Life Cycle Date:   03/28/2016 ; Life Cycle Status:   Active ; Vocabulary:   SNOMED CT        Hyperlipidemia (SNOMED CT  :57846962 )  Name of Problem:   Hyperlipidemia ; Recorder:   Giove,  Lauri B; Confirmation:   Confirmed ; Classification:   Patient Stated ; Code:   [id number] ; Contributor System:   PowerChart ; Last Updated:   03/28/2016 12:03 EDT ; Life Cycle Date:   03/28/2016 ; Life Cycle Status:   Active ; Vocabulary:   SNOMED CT        Hypothyroidism (SNOMED CT  :44010272 )  Name of Problem:    Hypothyroidism ; Recorder:   Giove,  Lauri B; Confirmation:   Confirmed ; Classification:   Patient Stated ; Code:   53664403 ; Contributor System:   Dietitian ; Last Updated:   03/28/2016 12:04 EDT ; Life Cycle Date:   03/28/2016 ; Life Cycle Status:   Active ; Vocabulary:   SNOMED CT        Multiple sclerosis (SNOMED CT  :47425956 )  Name of Problem:   Multiple sclerosis ; Recorder:   Giove,  Lauri B; Confirmation:   Confirmed ; Classification:   Patient Stated ; Code:   [id number] ; Contributor System:   Dietitian ; Last Updated:   03/28/2016 12:02 EDT ; Life Cycle Date:   03/28/2016 ; Life Cycle Status:   Active ; Vocabulary:   SNOMED CT        Neurogenic bladder (SNOMED CT  :2951884166 )  Name of Problem:   Neurogenic bladder ; Recorder:   Giove,  Lauri B; Confirmation:   Confirmed ; Classification:  Patient Stated ; Code:   [id number] ; Contributor System:   Dietitian ; Last Updated:   03/28/2016 12:03 EDT ; Life Cycle Date:   03/28/2016 ; Life Cycle Status:   Active ; Vocabulary:   SNOMED CT        Osteoporosis (SNOMED CT  :098119147 )  Name of Problem:   Osteoporosis ; Recorder:   Giove,  Lauri B; Confirmation:   Confirmed ; Classification:   Patient Stated ; Code:   [id number] ; Contributor System:   PowerChart ; Last Updated:   03/28/2016 12:04 EDT ; Life Cycle Date:   03/28/2016 ; Life Cycle Status:   Active ; Vocabulary:   SNOMED CT        UTI (urinary tract infection) (SNOMED CT  :865784696 )  Name of Problem:   UTI (urinary tract infection) ; Recorder:   Giove,  Lauri B; Confirmation:   Confirmed ; Classification:   Patient Stated ; Code:   295284132 ; Contributor System:   PowerChart ; Last Updated:   03/28/2016 12:03 EDT ; Life Cycle Date:   03/28/2016 ; Life Cycle Status:   Active ; Vocabulary:   SNOMED CT          Diagnoses(Active)    C. difficile colitis  Date:   03/29/2016 ; Diagnosis Type:   Discharge ; Confirmation:   Confirmed ; Clinical Dx:   C. difficile colitis ; Classification:   Medical ; Code:    ICD-10-CM ; Probability:   0 ; Diagnosis Code:   A04.7      Chronic constipation  Date:   03/29/2016 ; Diagnosis Type:   Discharge ; Confirmation:   Confirmed ; Clinical Dx:   Chronic constipation ; Classification:   Medical ; Code:   ICD-10-CM ; Probability:   0 ; Diagnosis Code:   K59.09      Chronic venous insufficiency  Date:   03/29/2016 ; Diagnosis Type:   Discharge ; Confirmation:   Confirmed ; Clinical Dx:   Chronic venous insufficiency ; Classification:   Medical ; Code:   ICD-10-CM ; Probability:   0 ; Diagnosis Code:   I87.2      Hypothyroidism  Date:   03/29/2016 ; Diagnosis Type:   Discharge ; Confirmation:   Confirmed ; Clinical Dx:   Hypothyroidism ; Classification:   Medical ; Code:   ICD-10-CM ; Probability:   0 ; Diagnosis Code:   E03.9      Ileus  Date:   03/29/2016 ; Diagnosis Type:   Discharge ; Confirmation:   Confirmed ; Clinical Dx:   Ileus ; Classification:   Medical ; Code:   ICD-10-CM ; Probability:   0 ; Diagnosis Code:   K56.7      Multiple sclerosis  Date:   03/29/2016 ; Diagnosis Type:   Discharge ; Confirmation:   Confirmed ; Clinical Dx:   Multiple sclerosis ; Classification:   Medical ; Code:   ICD-10-CM ; Probability:   0 ; Diagnosis Code:   G35      Neurogenic bladder  Date:   03/29/2016 ; Diagnosis Type:   Discharge ; Confirmation:   Confirmed ; Clinical Dx:   Neurogenic bladder ; Classification:   Medical ; Code:   ICD-10-CM ; Probability:   0 ; Diagnosis Code:   N31.9      UTI (urinary tract infection)  Date:   03/29/2016 ; Diagnosis Type:   Discharge ; Confirmation:   Confirmed ; Clinical Dx:   UTI (urinary tract  infection) ; Classification:   Medical ; Code:   ICD-10-CM ; Probability:   0 ; Diagnosis Code:   N39.0        Pre-Assessment   Diet Type :   Regular Diet - 03/29/16 19:38:00 EDT, Constant Indicator     Home Diet Consistency :   Regular   Current Method of Nutrition :   Oral intake   Tracheostomy Information :   No qualifying data available.       Graciella Belton, SLP, HEATHER L -  03/30/2016 15:52 EDT   Home Environment   Living Environment :   Home Environment  Devices/Equipment at Home:  Wheelchair  Performed By:  Elmer Ramp, RN, MARIA NORLIS 03/29/2016  Living Situation:  Home with family support  Performed By:  Dionisio David, MARIA NORLIS 03/29/2016  Professional Skilled Services:  Occupational Therapy, Physical Therapy  Performed By:  Elmer Ramp, RN, MARIA NORLIS 03/29/2016  Kitchen:  1st floor  Performed By:  Marylou Mccoy B 03/28/2016  Laundry:  1st floor  Performed By:  Marylou Mccoy B 03/28/2016  Lives In:  Single level home  Performed By:  Marylou Mccoy B 03/28/2016  Lives With:  Family, Spouse  Performed By:  Marylou Mccoy B 03/28/2016  Patient's Responsibilities:  Leisure/Play/Hobbies, Personal ADL, Social participation  Performed By:  Marylou Mccoy B 03/28/2016  Primary Bathroom:  1st floor  Performed By:  Marylou Mccoy B 03/28/2016  Primary Bedroom:  1st floor  Performed By:  Marylou Mccoy B 03/28/2016     Lives In :   Single level home   Lives With :   Family, Spouse   Living Situation :   Home with family support   [place], [place] - 03/30/2016 15:52 EDT   Prior Functional Level Grid   ADL :   Assist needed   Mobility :   Independent   Graciella Belton, SLP, HEATHER L - 03/30/2016 15:52 EDT   Patient's Responsibilities Rehab :   Leisure/Play/Hobbies, Personal ADL, Social participation   [place], [place], [place] Critchley - 03/30/2016 15:52 EDT   Hearing Screening   Case History Hearing Screening Grid   Patient Reports Difficulty Hearing :   No   Graciella Belton, SLP, HEATHER L - 03/30/2016 15:52 EDT   Informal Hearing Assessment :   Osvaldo Shipper, SLP, HEATHER L - 03/30/2016 15:52 EDT   Auditory Comprehension   Auditory Comprehension Grid     Conversation Complex          Percentage :    100 %                YAGER, SLP, HEATHER L - 03/30/2016 15:52 EDT         Auditory Comprehension Details :   Pt participating in complex conversation regarding recent history and medical issues w/o Q's/assist.   Graciella Belton, SLP, HEATHER L -  03/30/2016 15:52 EDT   Expression   Communication Method :   Macario Carls, SLP, HEATHER L - 03/30/2016 15:52 EDT   Advanced Verbal Expression Grid     Conversation          Percentage :    100 %              Comment :    Pt conversing in complex conversation w/ complete sentences w/ 100% intelligibility w/o Q's/assist or increased time.                 Graciella Belton,  SLP, HEATHER L - 03/30/2016 15:52 EDT         Pragmatics   Pragmatic Skills Grid   Eye Contact :   Within functional limits   Personal Space :   Within functional limits   Facial Expression :   Within functional limits   Communicative Intent :   Within functional limits   Intonation :   Within functional limits   Gestures :   Within functional limits   YAGER, SLP, HEATHER L - 03/30/2016 15:52 EDT   Pragmatic Verbal Skills Grid   Initiation :   Within functional limits   Topic Maintenance :   Within functional limits   Presupposition :   Within functional limits   Response Length :   Within functional limits   Turn Taking :   Within functional limits   Communicative Intent :   Within functional limits   Referencing Skills :   Within functional limits   Completeness :   Within functional limits   Graciella Belton, SLP, HEATHER L - 03/30/2016 15:52 EDT   Cognitive-Communication   Orientation Skills Grid   Person :   Yes   Place :   Yes   Time :   Yes   Situation Leitha Bleak, SLP, HEATHER L - 03/30/2016 15:52 EDT   Attention Skills Grid   Focused :   Within functional limits   Sustained :   Within functional limits   YAGER, SLP, HEATHER L - 03/30/2016 15:52 EDT   Memory Skills Grid   Immediate :   Within functional limits   Retrieval :   Impaired, Good memory of recent medical history. Decreased recall of recent meds needed for bowels.  Need further testing.   Graciella Belton, SLP, HEATHER L - 03/30/2016 15:52 EDT   Organization Skills Grid   Convergent Thinking :   Within functional limits   YAGER, SLP, HEATHER L - 03/30/2016 15:52 EDT   Problem Solving Skills Grid   Functional Simple :    Within functional limits   Functional Complex :   Min Q's/assist to PS complex issues around meds   YAGER, SLP, HEATHER L - 03/30/2016 15:52 EDT   Cognition Executive Function Grid   Awareness :   Within functional limits   Initiation :   Within functional limits   YAGER, SLP, HEATHER L - 03/30/2016 15:52 EDT   Cognitive Communication Addl Detail :   Pt overwhelmed w/ getting here late last night.  Very concerned regarding not having gotten some meds last night and this am regarding bowels.  Further cognitive testing needed.    Graciella Belton, SLP, HEATHER L - 03/30/2016 15:52 EDT   Speech Production   Phoneme Production :   Adequate   Speech Rate :   Adequate   Voice Resonance :   Adequate   Voice Quality :   Adequate   Voice Intensity :   Acceptable   Voice Prosody :   Adequate   Voice Production :   Adequate   Respirations, Speech Characteristics :   Adequate   Speech Intelligibility Known Context :   Greater than 90%   Speech Intelligibility Unknown Context :   Greater than 90%   YAGER, SLP, HEATHER L - 03/30/2016 15:52 EDT   Education   Responsible Learner Present for Session :   No   Home Caregiver Name/Relationship :   [name] [employer]-spouse   Teaching Method :   Joaquin Courts, SLP, Herbert Seta  L - 03/30/2016 15:52 EDT   Speech Education Grid   Speech Language/Pathology Tx Plan :   Trenton Gammon understanding   Graciella Belton, SLP, HEATHER L - 03/30/2016 15:52 EDT   Severity Level   Comprehension Indep Measure :   Complete independence   Comprehension Mode Indep Measure :   Auditory   Expression Indep Measure :   Complete independence   Expression Mode Indep Measure :   Vocal   Problem Solving Indep Measure :   Standby prompting   Memory Indep Measure :   Standby prompting   Social Interaction Indep Measure :   Complete independence   Harold, SLP, HEATHER L - 03/30/2016 15:52 EDT   Long Term Goals   SLP Long Term Goals Grid     SLP Long Term Goal #1          Goal :    Pt to recall strategies/recommendations and PS daily activities w/  mod I for safety.               Status :    Progressing, continue                YAGER, SLP, HEATHER L - 03/30/2016 15:52 EDT         Comprehension Goal Indep Measure :   Complete independence   Comprehension Mode Goal :   Auditory, Visual   Expression Goal Indep Measure :   Complete independence   Expression Mode Goal :   Vocal   Social Interaction Goal Indep Measure :   Complete independence   Problem Solving Goal Indep Measure :   Modified independence   Memory Goal Indep Measure :   Modified independence   YAGER, SLP, HEATHER L - 03/30/2016 15:52 EDT   Short Term Goals   Other SLP Short Term Goal Grid     Goal #1          Other :    Full reading/visual and cognitive assessment completed to determine need for Speech Therapy during inpatient stay.               Status :    Initial                YAGER, SLP, HEATHER L - 03/30/2016 15:52 EDT         Clinical Assessment   SLP Assessment :   Pt presents w/ possible mild deficits in ST recall and functional PS'ing.  Pt upset and concerned regarding medications this am, so therapy time used to resolve concerns.  Pt w/ functional speech and swallow following MS exacerbation.  Pt reports some difficulties w/ memory, but unsure if related to MS or aging. Full cognitive testing to be completed to determine need for ST during inpatient stay.        Graciella Belton, SLP, HEATHER L - 03/30/2016 15:52 EDT   Plan   Frequency :   Mo/Tu/We/Th/Fr   Duration :   2    Duration Unit :   Weeks   Planned Treatments :   Advanced cognitive-communication skills, Cognitive Skills Therapy, Family and caregiver education, Functional cognitive skills   Plan/Goals Established With Patient/Caregiver :   Yes   Barriers to Safe Discharge :   Progressive nature of disease   YAGER, SLP, HEATHER L - 03/30/2016 15:52 EDT   Time Spent With Patient   SLP Time In :   9:00 EST   SLP Time Out :   9:30 EST   SLP  Speech Language Evaluation :   30 Minutes   SLP Speech Althia Forts Time :   30 minutes   SLP Total Individual  Therapy Time :   30 minutes   SLP Total Timed Code Treatment Units :   2 units   SLP Total Tx Time :   30 minutes   YAGER, SLP, HEATHER L - 03/30/2016 15:52 EDT   Results/Recommendations   Additional Information SLP :   Pt presents w/ possible mild deficits in ST recall and functional PS'ing.  Pt upset and concerned regarding medications this am, so therapy time used to resolve concerns.  Pt w/ functional speech and swallow following MS exacerbation.  Pt reports some difficulties w/ memory, but unsure if related to MS or aging. Full cognitive testing to be completed to determine need for ST during inpatient stay.        Graciella Belton, SLP, HEATHER L - 03/30/2016 15:52 EDT

## 2016-03-30 NOTE — Progress Notes (Signed)
Functional Indep Measure Scores - Text       Functional Independence Measure Scores Entered On:  03/30/2016 16:10 EDT    Performed On:  03/30/2016 16:10 EDT by Graciella Belton, SLP, HEATHER L               Comprehension Score   Mode of Comprehension :   Auditory   Comprehends Complex or Abstract Information Without Prompting or Cueing :   Yes   Understands Complex or Abstract Directions and Conversations :   At all times   Comprehension Indep Measure Interim :   Complete independence   YAGER, SLP, HEATHER L - 03/30/2016 16:10 EDT   Expression Score   Expression Mode :   Vocal   Expresses Complex or Abstract Information Without Prompting or Cueing :   Yes   Expresses Complex or Abstract Ideas :   Clearly and fluently at all times   Expression Indep Measure Interim :   Complete independence   YAGER, SLP, HEATHER L - 03/30/2016 16:10 EDT   Social Interaction Score   Interacts Appropriately Without Supervision :   Yes   Interacts Appropriately :   At all times   Social Interaction Indep Measure Interim :   Complete independence   YAGER, SLP, HEATHER L - 03/30/2016 16:10 EDT   Problem Solving Score   Solves Complex Problems :   No   Ability to Solve Routine Problems :   Standby, 90 percent, requires prompting 10 percent or less   Problem Solving Indep Measure Interim :   Standby prompting   YAGER, SLP, HEATHER L - 03/30/2016 16:10 EDT   Memory Score   Recognizes, Remembers Routines, and Executes Requests Without Prompting :   No   Remembers and Executes Requests With Prompting :   Standby, 90 percent, requires prompting 10 percent or less   Memory Indep Measure Interim :   Standby prompting   YAGER, SLP, HEATHER L - 03/30/2016 16:10 EDT

## 2016-03-31 NOTE — Progress Notes (Signed)
 Interdisciplinary Team Conference - Text       Interdisciplinary Team Conference PF Entered On:  03/31/2016 16:44 EDT    Performed On:  03/31/2016 16:43 EDT by ANGELO KLEIN, SARA S               Team Members   Nurse :   GEORGEANNA RN, CATHERINE   Physical Therapist :   DIMITRI KLEIN EVA LELON   Speech Language Pathologist :   RAYNALDO EARL HERON LITTIE   Rehab Physician :   MARZETTE LYNWOOD BRAVO   Team Conference Date :   04/01/2016 EDT   DIMITRI, PT, EVA LELON - 04/01/2016 11:13 EDT   Primary Nurse :   NYLE RN, TYSON BRAVO NYLE, RN, MANUVETTE E - 04/01/2016 10:51 EDT   Primary Care Manager :   GREG SYLIVA BROCKS   Care Manager :   GREG SYLIVA BROCKS GREG SYLIVA C - 04/01/2016 9:26 EDT   Occupational Therapist :   LIDDIE GILLIE JOSETTE BRAVO DIMITRI, PT, EVA W - 04/01/2016 11:13 EDT     Primary OT :   BLOXOM, OT, NICOLE A   BLOXOM, OT, NICOLE A - 04/01/2016 9:19 EDT   Primary SLP :   RAYNALDO, SLP, HERON LITTIE RAYNALDO, SLP, BARBARA L - 04/01/2016 8:28 EDT   Care Management Summary.   Type of Conference :   Initial   Discharge Plan :   TBD   GREG SYLIVA BROCKS - 04/01/2016 9:26 EDT   Nursing Summary.   Bowel Management :   Incontinent   Bowel Program :   Dietary fiber, Stool softener   Bowel Movement Last Date :   04/01/2016 EDT   Bladder Management :   Incontinent   Urinary Elimination Management :   Disposable pad   Tracheostomy Information :   No qualifying data available.       Progress Achieved This Week :   Pt has not c/o of any pain/discomfort, and is on isolation for C-diff  Pt has however complained about her hospital stay.   Nursing Team Notes Current :   Yes   KENNEDY, RN, MANUVETTE E - 04/01/2016 10:51 EDT   OT Basic ADL   Basic ADL Grid   Eating :   Minimal contact assistance   Grooming :   Moderate assistance   Bathing :   Total assistance   UE Dressing :   Maximal assistance   LE Dressing :   Total assistance   Toileting :   Total assistance   Transfer Toilet :   Total assistance   Tub  Transfer :   Does not occur   Shower Transfer :   Does not occur   BLOXOM, OT, NICOLE A - 04/01/2016 9:19 EDT   ADL Comments :   Eating: min a to open packages, cut foot, occasional help to bring food to mouth  Grooming: assist to open containers, unable to open toothpaste lid, assist to brush hair and wash hands  Bathing: Req'd 2 people assist for bed mobility to wash bottom, washed LB at bedlevel, UB in chair, assist to wash BUE, able to wash chest/face independently only  LB dressing: 2 people assist for rolling  Ub dressing: assist to thread RUE, to pull over head, to pull around back  Toileting: 2 people assist, incontinent of bladder  Toilet transfer: 2 people assist, hoyer lift  Transferred bed-->w/c with 2 people assist, hoyer  lift      OT ADL Reviewed :   Yes   BLOXOM, OT, NICOLE A - 04/01/2016 9:19 EDT   OT Short Term Goals.   Eating Goal Grid     Goal #1          Activity :    Manage food containers/packages              Assist :    Distant supervision              Status :    Initial                BLOXOM, OT, NICOLE A - 04/01/2016 9:19 EDT         Grooming Goal Grid     Goal #1          Activity :    Grooming routine              Descriptors :    Supported short sit, Unsupported short sit, Wheelchair              Assist :    Contact guard assistance              Status :    Initial                BLOXOM, OT, NICOLE A - 04/01/2016 9:19 EDT         Upper Body Dressing Short Term Goal Grid     Goal #1          Activity :    Upper extremity dressing              Assist :    Moderate assistance              Status :    Initial                BLOXOM, OT, NICOLE A - 04/01/2016 9:19 EDT         Lower Body Dressing Grid     Goal #1          Activity :    Lower extremity dressing              Lower Body Dressing Descriptors :    Supine, Supported short sit, Unsupported short sit              Assist :    Maximal assistance              Status :    Initial                BLOXOM, OT, NICOLE A - 04/01/2016 9:19 EDT          Bathing Goal Grid     Goal #1          Activity :    Bathe              Descriptors :    Supported short sit, Unsupported short sit, Wheelchair              Assist :    Maximal assistance              Status :    Initial                BLOXOM, OT, NICOLE A - 04/01/2016 9:19 EDT         Toileting and Transfers Goal Grid  Goal #1  Goal #2        Activity :    Toilet transfers   Toileting           Assist :    Maximal assistance   Maximal assistance           Equipment :    Bedside commode              Transfer Equipment :    Drop arm commode, Elevated toilet seat, Grab bars, Sliding board   Drop arm commode, Elevated toilet seat, Grab bars, Sliding board           Status :    Initial   Initial             BLOXOM, OT, NICOLE A - 04/01/2016 9:19 EDT  BLOXOM, OT, NICOLE A - 04/01/2016 9:19 EDT        OT Balance Goal Grid     Goal #1          Descriptors :    Unsupported short sit              Type :    Static sitting              Assist :    Contact guard assistance              Length of Time (minutes) :    5 minutes              Rationale :    Improve independence with activities of daily living              Status :    Initial                BLOXOM, OT, NICOLE A - 04/01/2016 9:19 EDT         OT STG Reviewed :   Yes   BLOXOM, OT, NICOLE A - 04/01/2016 9:19 EDT   OT Long Term Goals.   Patient/Caregiver Goals :   To get stronger, to be independent   BLOXOM, OT, NICOLE A - 04/01/2016 9:19 EDT   OT IP Long Term Goals Grid     Long Term Goal 1  Long Term Goal 2        Goal :    Pt will perform simple meal prep activity with min assist to improve indep in ADLs   Pt will improve BUE strength to 3+/5 to improve indep in ADLS           Status :    Initial   Initial             BLOXOM, OT, NICOLE A - 04/01/2016 9:19 EDT  BLOXOM, OT, NICOLE A - 04/01/2016 9:19 EDT        Eating Goal   Eating Goal :   Complete independence   Grooming Goal :   Modified independence   Bathing Goal :   Minimal contact assistance   Upper Extremity  Dressing :   Minimal contact assistance   Lower Body Dressing Goal :   Minimal contact assistance   Toileting Goal :   Minimal contact assistance   Toilet Transfer Goal :   Minimal contact assistance   BLOXOM, OT, NICOLE A - 04/01/2016 9:19 EDT   OT LTG Reconcilation :   Shower goal TBD. Goals may be upgraded pending progress in therapy.   OT Long Term Goals Reviewed :   Yes  BLOXOM, OT, NICOLE A - 04/01/2016 9:19 EDT   Occupational Therapy Summary.   OT Team Notes Current :   Yes   BLOXOM, OT, NICOLE A - 04/01/2016 9:19 EDT   PT Mobility   Mobility Grid   Roll Left :   Rehab Total assistance   Roll Right :   Rehab Total assistance   Roll Supine :   Rehab Total assistance   Supine to Sit :   Rehab Total assistance   Sit to Supine :   Rehab Total assistance   Scooting :   Rehab Total assistance   Transfer Bed to and From Chair :   Rehab Maximal assistance   Transfer Toilet :   Total assistance   TAVARES, PT, SARA S - 03/31/2016 16:43 EDT   Amb Ability Varied Surf/Distraction Grid   Level Surfaces :   Does not occur   Uneven Surfaces :   Does not occur   Distracting Environments :   Does not occur   Curbs :   Does not occur   Stairs :   Does not occur   Ramp :   Does not occur   TAVARES, PT, SARA S - 03/31/2016 16:43 EDT   Transfer Type :   Transfer board   PT Mobility Reviewed :   Yes   TAVARES, PT, SARA S - 03/31/2016 16:43 EDT   PT WC Management   Type of Wheelchair :   Manual wheelchair   Wheelchair Details :   dependent due to manual tilt in space WC   [place], PT, SARA S - 03/31/2016 16:43 EDT   Wheelchair Mobility Grid   Level Surfaces :   Rehab Total assistance   [place], PT, SARA S - 03/31/2016 16:43 EDT   Wheelchair Mobility Level Distance :   150 ft   Wheelchair Mobility Reviewed :   Chaney CANARD, PT, SARA S - 03/31/2016 16:43 EDT   PT Short Term Goals.   Bed Mobility Goal Grid     Goal #1          Descriptors :    Roll to right and left              Level :    Moderate assistance              Status :     Progressing, continue                TAVARES, PT, SARA S - 03/31/2016 16:43 EDT         Transfers Goal Grid     Goal #1          Descriptors :    Transfer board              Level :    Moderate assistance              Status :    Initial                TAVARES, PT, SARA S - 03/31/2016 16:43 EDT         W/C Management Grid     Goal #1  Goal #2        Descriptors :    Power wheelchair mobility indoors   Pressure relief tilt in space (power wheelchair)           Level :    Minimal assistance   Minimal assistance  Status :    Initial   Initial             TAVARES, PT, SARA S - 03/31/2016 16:43 EDT  ANGELO, PT, SARA S - 03/31/2016 16:43 EDT        PT Balance Goal Grid     Goal #1          Descriptor :    Unsupported short sit              Assist Level :    Moderate assistance              Type :    Static sitting              Length of Time (minutes) :    2 minutes              Rationale :    Improve independence with activities of daily living                [place], [place], [place] S - 03/31/2016 16:43 EDT         PT STG Reviewed :   Chaney ANGELO, PT, SARA S - 03/31/2016 16:43 EDT   PT Long Term Goals.   PT Patient,Caregiver Goal :   To get back to being independent   TAVARES, PT, SARA S - 03/31/2016 16:43 EDT   Outpatient PT Long Term Goals Rehab     Long Term Goal 1          Goal :    Pt will complete car transfer with mod A in 3 weeks              Status :    Initial                TAVARES, PT, SARA S - 03/31/2016 16:43 EDT         Mobility Goals Grid   Bed, Chair, Wheelchair Goal :   Minimal contact assistance   Toilet Transfer Goal :   Minimal contact assistance   Wheelchair Mobility Level Surfaces Goal :   Modified independence   TAVARES, PT, SARA S - 03/31/2016 16:43 EDT   PT LTG Reconcilation :   LTG to be met in 3 weeks   Type of Wheelchair Goal :   Power wheelchair   Mode of Locomotion Goal :   Wheelchair   [place], , [place] S - 03/31/2016 16:43 EDT   Physical Therapy Summary.   Additional Comments DME PT :    ELOS 3 wks; home w/ assistance & power wc   PT Team Notes Current :   Yes   TAVARES, PT, SARA S - 03/31/2016 16:44 EDT   Severity Level.   Comprehension Indep Measure Interim :   Modified independence   Comprehension Mode :   Auditory   Expression Indep Measure Interim :   Complete independence   Expression Mode :   Vocal   Problem Solving Indep Measure Interim :   Modified independence   Memory Indep Measure Interim :   Complete independence   Social Interaction Indep Measure Interim :   Standby prompting   ARMSTRONG, SLP, BARBARA L - 04/01/2016 8:28 EDT   SLP Short Term Goals.   Other SLP Short Term Goal Grid     Goal #1          Other :    Full reading/visual and cognitive assessment completed to determine need for Speech  Therapy during inpatient stay.              Status :    Initial                ARMSTRONG, SLP, BARBARA L - 04/01/2016 8:28 EDT         SLP STG Reviewed :   Yes   ARMSTRONG, SLP, BARBARA L - 04/01/2016 8:28 EDT   SLP Long Term Goals.   SLP Long Term Goals Grid     SLP Long Term Goal #1          Goal :    Pt to recall strategies/recommendations and PS daily activities w/ mod I for safety.              Status :    Progressing, continue                ARMSTRONG, SLP, BARBARA L - 04/01/2016 8:28 EDT         Speech Therapy Summary.   Barriers to Safe Discharge SLP :   Progressive nature of disease   SLP Progress Note Current :   Yes   ARMSTRONG, SLP, BARBARA L - 04/01/2016 8:28 EDT   Psychology Summary.   Psychology Progress Notes Current :   N/A   WHITESIDES, PT, JUSTIN W - 04/01/2016 11:13 EDT   Interdisciplinary Discharge Planning.   Discharge Disposition Plan :   home with spouse and dtr who lives locally or to SNF   Rehab Anticipated Discharge Date :   04/17/2016 EDT   Next Level of Care :   Home Health, Outpatient   Follow-Up Plans :   OT, Physical Therapy: To Evaulate, Treat and Manage Care of Related Diagnosis   Team Conference Discussion RTF :   Eva Carder, PT, serving as scribe for  team.    Incontinent of bowel/bladder. Cog/communication/swallowing intact per SLP. Per SLP, pt wants a shower, and wants her meds scheduled around meal time. SLP will not be following. Per NSG, pt to be on timed voiding schedule [Q4hrs]. Pt spouse is elderly and limited in abilities to assist physically per SLP/MD.     Barriers to goals/Reasons for skilled intervention :   Bladder/bowel issues, Decreased endurance, Decreased sitting/standing balance, Decreased strength/ROM, Skin integrity issues   Medical/rehab reason for continued stay :   Anticoagulation, Bowel/bladder, Electrolyte imbalance, Home mobility issue, Infection, Medication adjustments, Paresis, Pending labs/diagnostics, Skin integrity   WHITESIDES, PT, JUSTIN W - 04/01/2016 11:13 EDT   Anticipated Therapy Interventions.   PT Estimated Hours per Week :   6.25 hours per week   Therapy Activity Tolerance :   3 hours over 5 days   OT Estimated Hours Per Week :   6.25 hours per week   SLP Estimated Hours per Week :   2.5 hours per week   WHITESIDES, PT, JUSTIN W - 04/01/2016 11:13 EDT   Rehab Team Goals   Team Mobility Goals Grid     Goal #1          Team Mobility Goals :    Toilet transfers              Team Mobility Assist Level :    With max assist              Team Mobility Goal Status :    Initial                WHITESIDES, PT, JUSTIN W - 04/01/2016  11:13 EDT         Team Mobility Goals 2 Grid     Goal #1          Team Mobility Goals :    Bed and or wheelchair transfer              Team Mobility Assist Level :    With mod assist              Team Mobility Goal Status :    Initial                DIMITRI, PT, JUSTIN W - 04/01/2016 11:13 EDT         Team Patient/Family Ed Goal Grid     Goal #1          Team Patient/Family Education Goals :    Demonstrates compliance with precautions, Demonstrates knowledge of diagnosis, Participate in family teaching/education              Team Patient/Family Edu Goal Status :    Initial                WHITESIDES, PT,  JUSTIN W - 04/01/2016 11:13 EDT         Team Skin Surveillance/Wound Goals Grid     Goal #1          Team Skin Surveillance/Wound Care Goals :    Skin surveillance              Skin Surveillance Wound Care Goal Status :    Initial                WHITESIDES, PT, JUSTIN W - 04/01/2016 11:13 EDT         Team Goal Timed Voiding :   Q4 hours   WHITESIDES, PT, JUSTIN W - 04/01/2016 11:13 EDT

## 2016-03-31 NOTE — Progress Notes (Signed)
Functional Indep Measure Scores - Text       Functional Independence Measure Scores Entered On:  03/31/2016 16:43 EDT    Performed On:  03/31/2016 16:42 EDT by Leeroy Cha, PT, SARA S               Transfer Bed/Chair/WC Score   Patient's independence Level with Bed, Chair, Wheelchair Tasks :   Does not occur   Bed, Chair, Wheelchair Transfer :   Does not occur   TAVARES, PT, SARA S - 03/31/2016 16:42 EDT   Walk/Wheelchair Score   Patient Ambulates :   Does not occur   Functional Independence Measure Walk :   Does not occur   Assessed Locomotion in a Wheelchair :   Does not occur   Functional Independence Measure Wheelchair :   Does not occur   TAVARES, PT, SARA S - 03/31/2016 16:42 EDT   Stairs Score   Stairs Assessed :   Does not occur   Functional Independence Measure Stairs :   Does not occur   TAVARES, PT, SARA S - 03/31/2016 16:42 EDT   Comprehension Score   Mode of Comprehension :   Auditory   Comprehends Complex or Abstract Information Without Prompting or Cueing :   Yes   Understands Complex or Abstract Directions and Conversations :   Mild difficulty   Comprehension Indep Measure Interim :   Modified independence   TAVARES, PT, SARA S - 03/31/2016 16:42 EDT   Expression Score   Expression Mode :   Vocal   Expresses Complex or Abstract Information Without Prompting or Cueing :   Yes   Expresses Complex or Abstract Ideas :   Clearly and fluently at all times   Expression Indep Measure Interim :   Complete independence   TAVARES, PT, SARA S - 03/31/2016 16:42 EDT   Social Interaction Score   Interacts Appropriately Without Supervision :   No   Appropriate in Social Situations :   Standby, 90 percent, requires prompting 10 percent or less   Social Interaction Indep Measure Interim :   Standby prompting   TAVARES, PT, SARA S - 03/31/2016 16:42 EDT   Problem Solving Score   Solves Complex Problems :   Yes   Ability to Solve Complex Problems :   Makes decisions with mild difficulty   Problem Solving Indep Measure Interim :    Modified independence   TAVARES, PT, SARA S - 03/31/2016 16:42 EDT   Memory Score   Recognizes, Remembers Routines, and Executes Requests Without Prompting :   Yes   Remembers and Executes Requests :   Consistently without need for repetition   Memory Indep Measure Interim :   Complete independence   TAVARES, PT, SARA S - 03/31/2016 16:42 EDT

## 2016-03-31 NOTE — Nursing Note (Signed)
Medication Administration Follow Up-Text       Medication Administration Follow Up Entered On:  03/31/2016 20:09 EDT    Performed On:  03/31/2016 13:52 EDT by Moshe CiproMANAHAN, RN, ELIZABETH C      Intervention Information:     bisacodyl  Performed by Moshe CiproMANAHAN, RN, ELIZABETH C on 03/31/2016 12:52:00 EDT       bisacodyl,10mg   PR,constipation       Medication Effectiveness Evaluation   Medication Administration Reason :   Constipation   Medication Effective :   Yes   Medication Response :   Symptoms improved   MANAHAN, RN, ELIZABETH C - 03/31/2016 20:09 EDT

## 2016-03-31 NOTE — Progress Notes (Signed)
PT Inpatient Daily Documentation - Text       PT Inpatient Daily Documentation Entered On:  03/31/2016 16:42 EDT    Performed On:  03/31/2016 16:31 EDT by Leeroy Cha, PT, SARA S               Reason for Treatment   Subjective Statement :   pt agreeable to therapy; finished session in L sidelying, alarm intact, necessities within reach     *Reason for Referral :   Progressive MS (dx 1989)  new weakness following UTI and C-diff    3 hrs/day     *Chief Complaint :   weakness bilat LE's, tightness R ankle/calf     TAVARES, PT, SARA S - 03/31/2016 16:31 EDT   Review/Treatments Provided   PT Goals :   PT Short Term Goals    03/30/2016  Bed Mobility Goal #1: Roll to right and left; Mod A; Initial  Transfer Goal #1: Transfer board; Mod A; Initial  Wheelchair Management Goal #1: Power wheelchair mobility indoors; Minimal assistance; Initial  Wheelchair Management Goal #2: Pressure relief tilt in space (power wheelchair); Minimal assistance; Initial  Balance Goal #1: Unsupported short sit; Mod A; Static sitting; 2; Improve independence with activities of daily living     PT Plan :   Treatment Frequency:  Daily (modified)   Performed By: Eliseo Gum, PT, AMBER R  03/30/2016 09:30  Treatment Duration: 3 Performed By: Carney Living, AMBER R  03/30/2016 09:30  Planned Treatments: Balance training, Basic activities of daily living, Bed mobility training, Caregiver training, Electrical stimulation, Equipment training, Functional training, Manual therapy, Neuromuscular reeducation, Pain management, Posture/Body mechanics training,... Performed By: Carney Living, AMBER R  03/30/2016 09:30     Short Term Goals Reviewed :   Yes   Physical Therapy Orders :   Physical Therapy Inpatient Additional Treatment Rehab - 03/30/16 11:39:51 EDT, Balance training, Basic activities of daily living, Bed mobility training, Caregiver training, Electrical stimulation, Equipment training, Functional training, Manual therapy, Neuromuscular reeducation, Pain management,  Posture/...  PT FIMS - 03/29/16 19:38:00 EDT, Daily     Pain Present :   No actual or suspected pain   PT Therapeutic Activity,Mobility,Balance :   Yes   PT Manual Therapy Massage :   Yes   TAVARES, PT, SARA S - 03/31/2016 16:31 EDT   Therapeutic Activities/Mobility/Balance   Functional Activity :   Therapeutic Activities    No qualifying data available     Leeroy Cha, PT, SARA S - 03/31/2016 16:31 EDT   PT Therapeutic Activities Grid     Activity 1          Activity :    Bed mobility              Assist :    Total assistance              Position :    Sidelying, Supine              Equipment :    Other: hand rails, Trendelenburg position of hospital bed              Response :    Decreased endurance, Tolerated well              Comment :    totAx2 to roll patient side/side for positioning, cleaning up pt & changing sheets after urinary incontinence, & to position pt in sidelying w/ pillows at end of session; pt able to slightly reach w/ UE, otherwise  unable to assist w/ roll                TAVARES, PT, SARA S - 03/31/2016 16:31 EDT         Reassess Mobility :   Yes   TAVARES, PT, SARA S - 03/31/2016 16:31 EDT   PT Mobility   Mobility Grid   Roll Left :   Rehab Total assistance   Roll Right :   Rehab Total assistance   Roll Supine :   Rehab Total assistance   Supine to Sit :   Rehab Total assistance   Sit to Supine :   Rehab Total assistance   Scooting :   Rehab Total assistance   Transfer Toilet :   Total assistance   TAVARES, PT, SARA S - 03/31/2016 16:31 EDT   Amb Ability Varied Surf/Distraction Grid   Level Surfaces :   Does not occur   Uneven Surfaces :   Does not occur   Distracting Environments :   Does not occur   Curbs :   Does not occur   Stairs :   Does not occur   Ramp :   Does not occur   TAVARES, PT, SARA S - 03/31/2016 16:31 EDT   PT Mobility Reviewed :   [place] CunasYes   TAVARES, PT, SARA S - 03/31/2016 16:31 EDT   Manual Therapy/Massage   Manual Therapy/Massage Grid     Activity 1  Activity 2        Type :    Manual  stretch, Mobilization, Myofascial/Soft tissue mobilization   Other: (continued)           Region :    Cervical spine, Lumbar spine, Right hip, Sacrum, Right knee, Right ankle, Other: R calf              Technique :    Other: myofascial release, soft tissue mobilization, positional release              Rationale :    Decrease edema, Decrease spasm, Improve circulation, Improve skin integrity, Increase range of motion, Relaxation              Response :    Demonstrated positive response to treatment, Increased joint mobility, Tolerated well, Other: dec edema R foot/ankle, dec agitation              Comments  (Comment: dural tube stretch/rock/glide; R hip/knee/ankle positional release; R calf myofascial release & soft tissue mobilization; R ankle positional release; R foot metatarsal glides & fanning, toe ext stretch; (cont) [TAVARES, PT, SARA S - 03/31/2016 16:31 EDT] )  (Comment: (cont) pt demo improved R calf soft tissue mobility, dec edema R foot, improved R ankle DF ROM to neutral, & improved relaxation/dec agitation at end of session  [TAVARES, PT, SARA S - 03/31/2016 16:31 EDT] )        Leeroy ChaAVARES, PT, SARA S - 03/31/2016 16:31 EDT  Leeroy ChaAVARES, PT, SARA S - 03/31/2016 16:31 EDT        Short Term Goals   Bed Mobility Goal Grid     Goal #1          Descriptors :    Roll to right and left              Level :    Moderate assistance              Status :    Progressing, continue  Leeroy Cha, PT, SARA S - 03/31/2016 16:31 EDT         Transfers Goal Grid     Goal #1          Descriptors :    Transfer board              Level :    Moderate assistance              Status :    Initial                TAVARES, PT, SARA S - 03/31/2016 16:31 EDT         W/C Management Grid     Goal #1  Goal #2        Descriptors :    Power wheelchair mobility indoors   Pressure relief tilt in space (power wheelchair)           Level :    Minimal assistance   Minimal assistance           Status :    Initial   Initial             TAVARES, PT,  SARA S - 03/31/2016 16:31 EDT  Leeroy Cha, PT, SARA S - 03/31/2016 16:31 EDT        PT Balance Goal Grid     Goal #1          Descriptor :    Unsupported short sit              Assist Level :    Moderate assistance              Type :    Static sitting              Length of Time (minutes) :    2 minutes              Rationale :    Improve independence with activities of daily living                [place], PT, SARA S - 03/31/2016 16:31 EDT         PT ST Goals Reviewed :   [place], PT, SARA S - 03/31/2016 16:31 EDT   Assessment   PT Impairments or Limitations :   Abnormal tone, Balance deficits, Bed mobility deficits, Coordination/Proprioception deficits, Endurance deficits, Equipment training, Impaired sensation, Range of motion deficits, Strength deficits, Transfer deficits, Transition deficits, Wheelchair mobility deficits   Barriers to Safe Discharge PT :   Medical diagnosis   Discharge Recommendations :   ELOS: 3 weeks; home with assist and PWC     PT Treatment Recommendations :   pt demo improved R LE soft tissue mobility & ROM after session; needs continued skilled PT to maximize fxnal & safe return to home/community     TAVARES, PT, SARA S - 03/31/2016 16:31 EDT   Time Spent With Patient   PT Time In :   15:00 EST   PT Time Out :   15:45 EST   PT Manual Therapy Units :   2 units   PT Manual Therapy Time :   30 minutes   PT ADL TRAINING 15 MN :   1 units   PT ADL Training Time :   15 minutes   PT Total Individual Therapy Time :   45 minutes   PT Total Timed Code Treatment Units :   3 units  PT Total Timed Code Tx Minutes :   45 minutes   PT Total Treatment Time Rehab :   45 minutes   TAVARES, PT, SARA S - 03/31/2016 16:31 EDT

## 2016-04-01 NOTE — Progress Notes (Signed)
PT Inpatient Daily Documentation - Text       PT Inpatient Daily Documentation Entered On:  04/01/2016 12:31 EDT    Performed On:  04/01/2016 12:22 EDT by Marisa SprinklesKraft, PT, Magdalene MollyKaitlin C               Reason for Treatment   *Reason for Referral :   Progressive MS (dx 1989)  new weakness following UTI and C-diff    3 hrs/day     *Chief Complaint :   weakness bilat LE's, tightness R ankle/calf     Marisa SprinklesKraft, PT, Magdalene MollyKaitlin C - 04/01/2016 12:22 EDT   Review/Treatments Provided   PT Goals :   PT Short Term Goals    03/31/2016  Bed Mobility Goal #1: Roll to right and left; Mod A; Progressing, continue  Transfer Goal #1: Transfer board; Mod A; Initial  Wheelchair Management Goal #1: Power wheelchair mobility indoors; Minimal assistance; Initial  Wheelchair Management Goal #2: Pressure relief tilt in space (power wheelchair); Minimal assistance; Initial  Balance Goal #1: Unsupported short sit; Mod A; Static sitting; 2; Improve independence with activities of daily living     PT Plan :   Treatment Frequency:  Daily (modified)   Performed By: Eliseo GumBENTON, PT, AMBER R  03/30/2016 09:30  Treatment Duration: 3 Performed By: Carney LivingBENTON, PT, AMBER R  03/30/2016 09:30  Planned Treatments: Balance training, Basic activities of daily living, Bed mobility training, Caregiver training, Electrical stimulation, Equipment training, Functional training, Manual therapy, Neuromuscular reeducation, Pain management, Posture/Body mechanics training,... Performed By: Carney LivingBENTON, PT, AMBER R  03/30/2016 09:30     Short Term Goals Reviewed :   Yes   Physical Therapy Orders :   Physical Therapy Inpatient Additional Treatment Rehab - 03/30/16 11:39:51 EDT, Balance training, Basic activities of daily living, Bed mobility training, Caregiver training, Electrical stimulation, Equipment training, Functional training, Manual therapy, Neuromuscular reeducation, Pain management, Posture/...  PT FIMS - 03/29/16 19:38:00 EDT, Daily     Pain Present :   No actual or suspected pain   PT  Therapeutic Activity,Mobility,Balance :   Yes   Marisa SprinklesKraft, PT, Magdalene MollyKaitlin C - 04/01/2016 12:22 EDT   Therapeutic Activities/Mobility/Balance   Functional Activity :   Therapeutic Activities  Activity 1:  Bed mobility; Total A; Sidelying, Supine; Other: hand rails, Trendelenburg position of hospital bed; Decreased endurance, Tolerated well; totAx2 to roll patient side/side for positioning, cleaning up pt & changing sheets after urinary incontinence, & to position pt in sidelying w/ pillows at end of session; pt able to slightly reach w/ UE, otherwise unable to assist w/ roll       Performed Date:  03/31/2016     Pearletha FurlKraft, PT, Magdalene MollyKaitlin C - 04/01/2016 12:22 EDT   PT Therapeutic Activities Grid     Activity 1  Activity 2        Activity :    Transfer training   Static sitting balance           Assist :    Maximal assistance   Moderate assistance           Position :    Unsupported short sit   Supported short sit           Equipment :    Sliding board              Comment :    Bed<>W/C via slideboard requiring maxA due to poor sitting balance, trunk control and UE strength.   Pt sat EOB for 5 minutes  requiring modA to maintain balance with BUE support due to poor trunk and UE strength.             Marisa Sprinkles, PT, [place] C - 04/01/2016 12:22 EDT  Marisa Sprinkles, PT, Kaitlin C - 04/01/2016 12:22 EDT        Reassess Mobility :   Shary Key, PT, Magdalene Molly - 04/01/2016 12:22 EDT   PT Mobility   Mobility Grid   Roll Left :   Rehab Total assistance   Roll Right :   Rehab Total assistance   Roll Supine :   Rehab Total assistance   Supine to Sit :   Rehab Total assistance   Sit to Supine :   Rehab Total assistance   Scooting :   Rehab Total assistance   Transfer Bed to and From Chair :   Rehab Maximal assistance   Transfer Toilet :   Total assistance   [place], PT, [place] C - 04/01/2016 12:22 EDT   Amb Ability Varied Surf/Distraction Grid   Level Surfaces :   Does not occur   Uneven Surfaces :   Does not occur   Distracting Environments :   Does not occur    Curbs :   Does not occur   Stairs :   Does not occur   Ramp :   Does not occur   [place], PT, [place] C - 04/01/2016 12:22 EDT   Transfer Type :   Transfer board   PT Mobility Reviewed :   Yes   Marisa Sprinkles, PT, Magdalene Molly - 04/01/2016 12:22 EDT   Short Term Goals   Bed Mobility Goal Grid     Goal #1          Descriptors :    Roll to right and left              Level :    Moderate assistance              Status :    Progressing, continue                Maude Leriche - 04/01/2016 12:22 EDT         Transfers Goal Grid     Goal #1          Descriptors :    Transfer board              Level :    Moderate assistance              Status :    Initial                Marisa Sprinkles, PT, Magdalene Molly - 04/01/2016 12:22 EDT         W/C Management Grid     Goal #1  Goal #2        Descriptors :    Power wheelchair mobility indoors   Pressure relief tilt in space (power wheelchair)           Level :    Minimal assistance   Minimal assistance           Status :    Initial   Initial             Maude Leriche - 04/01/2016 12:22 EDT  Marisa Sprinkles, PT, Kaitlin C - 04/01/2016 12:22 EDT        PT Balance Goal Grid     Goal #1  Descriptor :    Unsupported short sit              Assist Level :    Moderate assistance              Type :    Static sitting              Length of Time (minutes) :    2 minutes              Rationale :    Improve independence with activities of daily living              Status :    Initial                Marisa SprinklesKraft, PT, Magdalene MollyKaitlin C - 04/01/2016 12:22 EDT         PT ST Goals Reviewed :   Yes   Maude LericheKraft, PT, Kaitlin C - 04/01/2016 12:22 EDT   Education   Home Caregiver Name/Relationship :   Jesusita OkaAlfred Parcher-spouse   Kraft, [place], Magdalene MollyKaitlin C - 04/01/2016 12:22 EDT   Physical Therapy Education Grid   Bed to Chair Transfers :   Bristol-[employer]ers SquibbVerbalizes understanding, Demonstrates, Needs further teaching, Needs practice/supervision   Physical Therapy Plan of Care :   Verbalizes understanding, Demonstrates, Needs further teaching, Needs practice/supervision    Marisa SprinklesKraft, PT, Kaitlin C - 04/01/2016 12:22 EDT   Assessment   PT Impairments or Limitations :   Abnormal tone, Balance deficits, Bed mobility deficits, Coordination/Proprioception deficits, Endurance deficits, Equipment training, Impaired sensation, Range of motion deficits, Strength deficits, Transfer deficits, Transition deficits, Wheelchair mobility deficits   Barriers to Safe Discharge PT :   Medical diagnosis   Discharge Recommendations :   ELOS: 3 weeks; home with assist and PWC     PT Treatment Recommendations :   Pt continues to require skilled PT intervention to improve sitting balance, trunk and UE strength and coordination to complete bed mobility and slideboard transfers with increased independence.  Pt also requires practice with power w/c before d/c to home as she has a [employer] power w/c that she used mod I previously.     Marisa SprinklesKraft, PT, [place] GroundKaitlin C - 04/01/2016 12:22 EDT   Time Spent With Patient   PT Treatment Time Comment :   Pt refused group therapy as she had just been transferred back to been due to fatigue sitting up all day.     Pearletha FurlKraft, PT, [place]aitlin C - 04/01/2016 15:37 EDT   PT Units Cancelled Missed     PT Units Lost #1          Amount :    2               Reason :    Other: fatigue                Maude LericheKraft, PT, Kaitlin C - 04/01/2016 15:37 EDT         PT Minutes Grid     PT Minutes Lost #1          Amount :    30               Reason :    Other: fatigue                Pearletha FurlKraft, PT, Magdalene MollyKaitlin C - 04/01/2016 15:37 EDT         PT Time In :   9:00 EST   PT Time Out :  9:30 EST   PT ADL TRAINING 15 MN :   2 units   PT ADL Training Time :   30 minutes   PT Total Individual Therapy Time :   30 minutes   PT Total Timed Code Treatment Units :   2 units   PT Total Timed Code Tx Minutes :   30 minutes   PT Total Treatment Time Rehab :   30 minutes   Marisa Sprinkles, PT, [place] L[place] C - 04/01/2016 12:22 EDT

## 2016-04-01 NOTE — Progress Notes (Signed)
OT Time Spent With Patient - Text       OT Time Spent With Patient Entered On:  04/01/2016 14:51 EDT    Performed On:  04/01/2016 14:50 EDT by Pricilla HolmGRIGIONI, COTA, DANIELLE               Time Spent With Patient   OT Time In :   14:00 EST   OT Time Out :   14:15 EST   OT Therapeutic Exercise Units :   1 units   OT Therapeutic Exercise Time :   15 minutes   OT Total Individual Therapy Time :   15 minutes   OT Treatment Time Comment :   Therapist and tech assisted Pt to reposition in bed. Therapist assisted Pt to complete towel exercises with B UE's, Mod A on R side. Pt was able to complete 1 set x 10 reps bicep curls, chest press, arm raises; to inc UB strength/endurance for ADLs.    OT Total Timed Code Treatment Units :   1 units   OT Total Timed Code Treatment Minutes :   15 minutes   OT Total Treatment Time Rehab :   15 minutes   Marisa CyphersGRIGIONI, COTA, DANIELLE - 04/01/2016 14:50 EDT

## 2016-04-01 NOTE — Progress Notes (Signed)
Functional Indep Measure Scores - Text       Functional Independence Measure Scores Entered On:  04/01/2016 11:41 EDT    Performed On:  04/01/2016 7:00 EDT by Blenda Peals, OT, NICOLE A               Bathing Score   Patient's Independence Level with Bathing Tasks :   Assistance   Type of Assistance Necessary :   Requires assistance of 2 people   Functional Independence Measure Bathing :   Total assistance   BLOXOM, OT, NICOLE A - 04/01/2016 11:38 EDT   Upper Body Dressing Score   Patient's Independence Level with Upper Body Dressing Tasks :   Assistance   Type of Assistance Necessary :   Assistance with dressing tasks   Tasks Assessed :   Thread/Unthread right sleeve, Thread/Unthread left sleeve, Pull/Remove head through neckline, Pull around the back   Tasks Requiring Assistance :   Pull/Remove head through neckline, Pull around the back   UE Dressing :   Moderate assistance   BLOXOM, OT, NICOLE A - 04/01/2016 11:38 EDT   Lower Body Dressing Score   Patient's independence Level with Lower Body Dressing Tasks :   Assistance   Type of Assistance Necessary :   Requires assistance of 2 people   LE Dressing :   Total assistance   BLOXOM, OT, NICOLE A - 04/01/2016 11:38 EDT   Toileting Score   Patient's Independence Level with Toileting Tasks :   Assistance   Type of Assistance Necessary :   Requires assistance of 2 people   Toileting :   Total assistance   BLOXOM, OT, NICOLE A - 04/01/2016 11:38 EDT   Transfer Bed/Chair/WC Score   Patient's independence Level with Bed, Chair, Wheelchair Tasks :   Assistance   Type of Assistance Necessary :   Mechanical lift required   Bed, Chair, Wheelchair Transfer :   Total assistance   BLOXOM, OT, NICOLE A - 04/01/2016 11:38 EDT   Comprehension Score   Mode of Comprehension :   Auditory   Comprehends Complex or Abstract Information Without Prompting or Cueing :   Yes   Understands Complex or Abstract Directions and Conversations :   Mild difficulty   Comprehension Indep Measure Interim :    Modified independence   BLOXOM, OT, NICOLE A - 04/01/2016 11:38 EDT   Expression Score   Expression Mode :   Vocal   Expresses Complex or Abstract Information Without Prompting or Cueing :   Yes   Expresses Complex or Abstract Ideas :   Mild difficulty   Expression Indep Measure Interim :   Modified independence   BLOXOM, OT, NICOLE A - 04/01/2016 11:38 EDT   Social Interaction Score   Interacts Appropriately Without Supervision :   Yes   Interacts Appropriately :   Requires more than reasonable time to make decisions   Social Interaction Indep Measure Interim :   Modified independence   BLOXOM, OT, NICOLE A - 04/01/2016 11:38 EDT   Problem Solving Score   Solves Complex Problems :   No   Ability to Solve Routine Problems :   Standby, 90 percent, requires prompting 10 percent or less   Problem Solving Indep Measure Interim :   Standby prompting   BLOXOM, OT, NICOLE A - 04/01/2016 11:38 EDT   Memory Score   Recognizes, Remembers Routines, and Executes Requests Without Prompting :   No   Remembers and Executes Requests With Prompting :  Standby, 90 percent, requires prompting 10 percent or less   Memory Indep Measure Interim :   Standby prompting   BLOXOM, OT, NICOLE A - 04/01/2016 11:38 EDT

## 2016-04-01 NOTE — Case Communication (Signed)
 CM Discharge Planning Assessment - Text       CM Discharge Planning Ongoing Assessment Entered On:  04/01/2016 17:37 EDT    Performed On:  04/01/2016 17:28 EDT by GREG LLANOS C               Discharge Needs I   Previously Documented Discharge Needs :   DISCHARGE PLAN/NEEDS:No discharge data available.  EQUIPMENT/TREATMENT NEEDS:No discharge data available.     Previously Documented Benefits Information :   Performed By: Karen Penner B  - 03/28/16 11:44:00       Anticipated Discharge Date :   04/17/2016 EDT   Anticipated Discharge Time Slot :   1000-1200   Discharge To :   Home with family support, Home with home health   Home Caregiver Name/Relationship :   Brett Soza   CM Progress Note :   04/01/2016  SW went to speak with Pt several times this day, but she was always indisposed - SW able to meet 1:1 with spouse.  SW presented the [facility]- Summary.  Pt's spouse does not feel the anticipated D/C date will be met, as far as, Pt making proposed goals.  Spouse would like to see Pt make it to as close to [place] with transfers as possible.  Additionally, Spouse would like to hold off presenting D/C date, due to potential of upsetting Pt.  Pt seems to be struggling with loss of independence and ability.  Please note that Pt has been in/out of hospitals since June.  Next, she has not gotten therapy in the past 3-4 weeks.  Spouse concerned about endurance and muscle loss.  Spouse requested assistance with release of information from Dr. Arlyss in [facility] in [place]  --> send clinical(s) to Dr. Dewane with [facility] (faxed releases).  SW will continue to assist/prn.  vcd        GREG LLANOS C - 04/01/2016 17:28 EDT

## 2016-04-01 NOTE — Progress Notes (Signed)
Functional Indep Measure Scores - Text       Functional Independence Measure Scores Entered On:  04/01/2016 15:40 EDT    Performed On:  04/01/2016 15:39 EDT by Marisa Sprinkles, PT, Magdalene Molly               Comprehension Score   Mode of Comprehension :   Auditory   Comprehends Complex or Abstract Information Without Prompting or Cueing :   Yes   Understands Complex or Abstract Directions and Conversations :   At all times   Comprehension Indep Measure Interim :   Complete independence   Marisa Sprinkles, PT, Magdalene Molly - 04/01/2016 15:39 EDT   Expression Score   Expression Mode :   Vocal   Expresses Complex or Abstract Information Without Prompting or Cueing :   Yes   Expresses Complex or Abstract Ideas :   Clearly and fluently at all times   Expression Indep Measure Interim :   Complete independence   Maude Leriche - 04/01/2016 15:39 EDT   Social Interaction Score   Interacts Appropriately Without Supervision :   Yes   Interacts Appropriately :   At all times   Social Interaction Indep Measure Interim :   Complete independence   Maude Leriche - 04/01/2016 15:39 EDT   Problem Solving Score   Solves Complex Problems :   Yes   Ability to Solve Complex Problems :   Consistently solves problems independently   Problem Solving Indep Measure Interim :   Complete independence   Maude Leriche - 04/01/2016 15:39 EDT   Memory Score   Recognizes, Remembers Routines, and Executes Requests Without Prompting :   Yes   Remembers and Executes Requests :   Consistently without need for repetition   Memory Indep Measure Interim :   Complete independence   Maude Leriche - 04/01/2016 15:39 EDT

## 2016-04-01 NOTE — Progress Notes (Signed)
Chaplaincy Note - Text       Chaplaincy Note Entered On:  04/01/2016 14:16 EDT    Performed On:  04/01/2016 14:14 EDT by Julaine Fusi               Chaplaincy Consult   Faith/Denomination :   Catholic   Importance of Faith :   Very important   Worshipping Community :   Not Discussed   Religious Family Issues :   Pt is from [place], [place], but daughter is local.  Daughter and husband are here for support.   Additional Information, Chaplaincy :   Pt came from [place], [place] to strenghten through her MS.  She has great confidence in the unit here, which is why she travelled her for therapy.   Julaine Fusi - 04/01/2016 14:14 EDT

## 2016-04-01 NOTE — Progress Notes (Signed)
OT Inpatient Daily Documentation - Text       OT Inpatient Daily Documentation Entered On:  04/01/2016 11:55 EDT    Performed On:  04/01/2016 7:00 EDT by Blenda PealsBLOXOM, OT, NICOLE A               Reason for Treatment   *Reason for Referral :   Admitted for MS exacerbation    82 yo female with past medical history of progressive MS diagnosed in 1989, CVA with right sided weakness, hypothyroidism and neurogenic bladder admitted to [facility] [place] on 03/06/16 with recurrent generalized weakness of the upper and lower extremities and poor posture. She has been wheelchair bound for the past few years due to right-sided weakness.    (per pt, she did not have a CVA)     *Chief Complaint :   weakness     BLOXOM, OT, NICOLE A - 04/01/2016 11:49 EDT   Review/Treatments Provided   OT Goals :   OT Short Term Goals    03/31/2016  Eating Goal #1: Manage food containers/packages; Distant S; Initial  Grooming Goal #1: Grooming routine; Supported short sit, Unsupported short sit, Wheelchair; CGA; Initial  Upper Body Dressing Goal #1: Upper extremity dressing; Mod A; Initial  Lower Body Dressing Goal #1: Lower extremity dressing; Supine, Supported short sit, Unsupported short sit; Max A; Initial  Bathing Goal #1: Bathe; Supported short sit, Unsupported short sit, Wheelchair; Max A; Initial  Toileting and Transfers Goal #1: Toilet transfers; Max A; Bedside commode; Drop arm commode, Elevated toilet seat, Grab bars, Sliding board; Initial  Toileting and Transfers Goal #2: Toileting; Max A; Drop arm commode, Elevated toilet seat, Grab bars, Sliding board; Initial  Balance Goal #1: Unsupported short sit; Static sitting; CGA; 5; Improve independence with activities of daily living; Initial     OT Plan :   Treatment Frequency: Daily Performed By: Carilyn GoodpastureHOBBY, OT, LESLIE M   03/30/2016  Treatment Duration: 3 Performed By: Katherine RoanHOBBY, OT, LESLIE M   03/30/2016  Planned Treatments: Balance training, Basic Activities of Daily Living, Caregiver  training, Coordination, Energy conservation training, Equipment training, Group therapy, Mobility training, Patient education, Safety education, Therapeutic activities, Therapeutic exercises... Performed By: Katherine RoanHOBBY, OT, LESLIE M   03/30/2016     Occupational Therapy Orders :   Occupational Therapy Inpatient Additional Treatment Rehab - 03/30/16 16:40:41 EDT, Balance training, Basic Activities of Daily Living, Caregiver training, Coordination, Energy conservation training, Equipment training, Group therapy, Mobility training, Patient education, Safety education, Therapeutic activitie...  OT FIMS - 03/29/16 19:38:00 EDT, Daily     Pain Present :   No actual or suspected pain   BLOXOM, OT, NICOLE A - 04/01/2016 11:49 EDT   OT Basic ADL   Basic ADL Grid   Eating :   Minimal contact assistance   Grooming :   Moderate assistance   Bathing :   Total assistance   BLOXOM, OT, NICOLE A - 04/01/2016 11:49 EDT   UE Dressing :   Moderate assistance   BLOXOM, OT, NICOLE A - 04/01/2016 12:21 EDT     LE Dressing :   Total assistance   Toileting :   Total assistance   Transfer Toilet :   Total assistance   Tub Transfer :   Does not occur   Shower Transfer :   Does not occur   ADL Comments :   ADL 04/01/16:  Pt bathed in bed due to limited mobilty, required 2 people to roll side to side.  Pt washed face on her own and part of her chest. LB dressing with 2 person A again to roll side to side and lift legs. Pt unable to move legs.  Pt able to thread arms for UB dressing then needed A to pull shirt ovr head and down back.  2 person hoyer lift transfer from bed to wc.  Pt incontinent of urine both sessions today therefore completed bathing and LB dressing twice.    ADL 03/30/16:Eating: min a to open packages, cut foot, occasional help to bring food to mouth  Grooming: assist to open containers, unable to open toothpaste lid, assist to brush hair and wash hands  Bathing: Req'd 2 people assist for bed mobility to wash bottom, washed LB at  bedlevel, UB in chair, assist to wash BUE, able to wash chest/face independently only  LB dressing: 2 people assist for rolling  Ub dressing: assist to thread RUE, to pull over head, to pull around back  Toileting: 2 people assist, incontinent of bladder  Toilet transfer: 2 people assist, hoyer lift  Transferred bed-->w/c with 2 people assist, hoyer lift      BLOXOM, OT, NICOLE A - 04/01/2016 12:21 EDT     Education   Home Caregiver Name/Relationship :   Freida Busman, OT, NICOLE A - 04/01/2016 11:49 EDT   Occupational Therapy Education Grid   Activity of Daily Living Training :   Verbalizes understanding, Needs further teaching, Needs practice/supervision   BLOXOM, OT, NICOLE A - 04/01/2016 11:49 EDT   Assessment   OT Impairments or Limitations :   Balance deficits, Basic activity of daily living deficits, Coordination deficits, Endurance deficits, Equipment training, IADL deficits, Mobility deficits, Proprioception deficits, Safety awareness deficits, Strength deficits   OT Discharge Recommendations :   ELOS 3 weeks then home with spouse and power w/c     OT Treatment Recommendations :   Pt incontinent of urine at start of both sessions today. Cleaned and changed pt both times- 2 person A with hoyer and 2 person A to roll side to side in bed for bath and LB dressing. Pt cooperative but with limited participation due to significant LE weakness.   Pt would benefit from continued skilled IP OT to further progress towards goals.      BLOXOM, OT, NICOLE A - 04/01/2016 11:49 EDT   Time Spent With Patient   OT Time In :   7:00 EST   OT Time Out :   8:00 EST   OT Time In 2 :   10:30 EST   OT Time Out 2 :   11:00 EST   OT ADL TRAINING 15 MIN :   6    OT ADL Training Minutes :   90 minutes   OT Total Individual Therapy Time :   90 minutes   OT Total Timed Code Treatment Units :   6 units   OT Total Timed Code Treatment Minutes :   90 minutes   OT Total Treatment Time Rehab :   90 minutes   BLOXOM, OT, NICOLE  A - 04/01/2016 11:49 EDT

## 2016-04-01 NOTE — Progress Notes (Signed)
SLP Inpatient Discharge Comm Eval-text       SLP Inpatient Discharge Communication Evaluation Entered On:  04/01/2016 15:41 EDT    Performed On:  04/01/2016 15:34 EDT by ARMSTRONG, SLP, BARBARA L               Reason for Treatment   Subjective Statement :   pt. seen up in tilt in space wc secured. No family present. No c/o pain. pt. frustrated w/a number of issues she's shared w/nursing management.  pt. willing to complete session.     *Reason for Referral :   Pt to be evaluated 2* MS flare; ? cognitive deficits.     *Chief Complaint :   Pt reports some difficulties w/ memory over the past few years, but unsure if MS or age related.      ARMSTRONG, SLP, BARBARA L - 04/01/2016 15:34 EDT   General Information   Speech Orders :   Speech Language Pathology Acute Additional Treatment Rehab - 03/30/16 16:10:11 EDT, Advanced cognitive-communication skills, Cognitive Skills Therapy, Family and caregiver education, Functional cognitive skills, for 2 Weeks, Stop date 04/15/16 6:59:00 EDT, Mo/Tu/We/Th/Fr     Precautions :   Precaution Orders  Fall Risk Precautions - Ordered    -- 03/29/16 19:38:00 EDT, Stop date 03/29/16 19:38:00 EDT       Affect/Behavior :   Appropriate, Cooperative   Pain Interfering   With Session :   No   Cultural/Spiritual Beliefs to Incorporate Into Treatment Sessions :   No   ARMSTRONG, SLP, BARBARA L - 04/01/2016 15:34 EDT   Cognitive-Communication   Orientation Skills Grid   Person :   Yes   Place :   Yes   Time :   Yes   Situation :   Yes   Biographical Information :   Yes   ARMSTRONG, SLP, BARBARA L - 04/01/2016 15:34 EDT   Attention Skills Grid   Focused :   Within functional limits   Alternating :   Within functional limits   ARMSTRONG, SLP, BARBARA L - 04/01/2016 15:34 EDT   Memory Skills Grid   Immediate :   Within functional limits   Retrieval :   Within functional limits   ARMSTRONG, SLP, BARBARA L - 04/01/2016 15:34 EDT   Organization Skills Grid   Divergent Thinking :   Within functional limits    Sequencing :   Within functional limits   ARMSTRONG, SLP, BARBARA L - 04/01/2016 15:34 EDT   Problem Solving Skills Grid   Functional Simple :   Within functional limits   Functional Complex :   Within functional limits   ARMSTRONG, SLP, BARBARA L - 04/01/2016 15:34 EDT   Cognition Executive Function Grid   Attention to Detail :   Within functional limits   Awareness :   Within functional limits   Information Processing :   Within functional limits   Initiation :   Within functional limits   Reasoning :   Within functional limits   Self Monitoring :   Within functional limits   ARMSTRONG, SLP, BARBARA L - 04/01/2016 15:34 EDT   Long Term Goals   Comprehension Goal Indep Measure :   Complete independence   Comprehension Mode Goal :   Auditory, Visual   Expression Goal Indep Measure :   Complete independence   Expression Mode Goal :   Vocal   Social Interaction Goal Indep Measure :   Complete independence   Problem Solving Goal Indep Measure :  Modified independence   Memory Goal Indep Measure :   Modified independence   ARMSTRONG, SLP, BARBARA L - 04/01/2016 15:34 EDT   Short Term Goals   Other SLP Short Term Goal Grid     Goal #1          Other :    Cognitive assessment completed.              Status :    Goal met              Date Met :    04/01/2016 EDT              Comment :    SLP signing off.                ARMSTRONG, SLP, BARBARA L - 04/01/2016 15:34 EDT         Plan   Planned Treatments :   Advanced cognitive-communication skills, Cognitive Skills Therapy, Family and caregiver education, Functional cognitive skills   Barriers to Safe Discharge :   Progressive nature of disease   Treatment Recommendations :   Cognitive screening completed. pt. appears to be baseline. Primary concerns involve poor endurance, stress following months of hospitalization & frustration w/transition less than smooth.   ARMSTRONG, SLP, BARBARA L - 04/01/2016 15:34 EDT   Time Spent With Patient   SLP Time In :   9:30 EST   SLP Time Out :    10:00 EST   SLP Cognitive Skills Development Units :   2 units   SLP Cognitive Skills Development Time :   30 minutes   SLP Total Individual Therapy Time :   30 minutes   SLP Total Timed Code Treatment Units :   2 units   SLP Total Tx Time :   30 minutes   ARMSTRONG, SLP, BARBARA L - 04/01/2016 15:34 EDT   Results/Recommendations   Additional Information SLP :   RESULTS: SLP signing off the case. pt. will be best served with expertise of OT/PT & nursing to facilitate improved fnxl abilities, endurance and adjustment to this prolonged hospitalization following MS exacerbation.     ARMSTRONG, SLP, BARBARA L - 04/01/2016 15:34 EDT

## 2016-04-02 NOTE — Progress Notes (Signed)
Functional Indep Measure Scores - Text       Functional Independence Measure Scores Entered On:  04/02/2016 11:50 EDT    Performed On:  04/02/2016 8:30 EDT by Eliseo GumBENTON, PT, AMBER R               Transfer Bed/Chair/WC Score   Patient's independence Level with Bed, Chair, Wheelchair Tasks :   Assistance   Type of Assistance Necessary :   Assistance for more than half (patient performs 25% - 49% of tasks)   Bed, Chair, Wheelchair Transfer :   Maximal assistance   MallardBENTON, PT, AMBER R - 04/02/2016 11:50 EDT

## 2016-04-02 NOTE — Progress Notes (Signed)
Functional Indep Measure Scores - Text       Functional Independence Measure Scores Entered On:  04/02/2016 0:47 EDT    Performed On:  04/02/2016 0:46 EDT by Elmer RampSIOSON, RN, MARIA NORLIS               Bladder Management Score   Patient's Independence Level with Bladder Management Tasks :   Assistance   Type of Assistance Necessary :   Helper initiates timed voiding schedule   Functional Independence Measure Bladder Management :   Total assistance   SIOSON, RN, MARIA NORLIS - 04/02/2016 0:46 EDT   Bowel Management Score   Patient's Independence Level with Bowel Management Tasks :   Assistance   Type of Assistance Necessary :   Helper changes linen or clothing/cleans up spill, Helper changes diaper   Functional Independence Measure Bowel Management :   Total assistance   SIOSON, RN, MARIA NORLIS - 04/02/2016 0:46 EDT

## 2016-04-02 NOTE — Nursing Note (Signed)
Medication Administration Follow Up-Text       Medication Administration Follow Up Entered On:  04/02/2016 4:47 EDT    Performed On:  04/02/2016 0:19 EDT by Elmer RampSIOSON, RN, MARIA NORLIS      Intervention Information:     zolpidem  Performed by Phillips ClimesBRAME, RN, KERI A on 04/01/2016 23:19:00 EDT       zolpidem,2.5mg   Oral,sleep       Medication Effectiveness Evaluation   Medication Administration Reason :   Other: sleep   Medication Effective :   Yes   Medication Response :   Symptoms improved   SIOSON, RN, MARIA NORLIS - 04/02/2016 4:47 EDT

## 2016-04-02 NOTE — Progress Notes (Signed)
OT Inpatient Daily Documentation - Text       OT Inpatient Daily Documentation Entered On:  04/02/2016 11:52 EDT    Performed On:  04/02/2016 10:30 EDT by WATERS, OT, MANDI R               Reason for Treatment   *Reason for Referral :   Admitted for MS exacerbation    82 yo female with past medical history of progressive MS diagnosed in 1989, CVA with right sided weakness, hypothyroidism and neurogenic bladder admitted to [facility] in [place], [place] on 03/06/16 with recurrent generalized weakness of the upper and lower extremities and poor posture. She has been wheelchair bound for the past few years due to right-sided weakness.    (per pt, she did not have a CVA)     *Chief Complaint :   weakness     WATERS, OT, MANDI R - 04/02/2016 11:45 EDT   Review/Treatments Provided   OT Modalities :   Yes   WATERS, OT, MANDI R - 04/02/2016 14:56 EDT   OT Goals :   OT Short Term Goals    03/31/2016  Eating Goal #1: Manage food containers/packages; Distant S; Initial  Grooming Goal #1: Grooming routine; Supported short sit, Unsupported short sit, Wheelchair; CGA; Initial  Upper Body Dressing Goal #1: Upper extremity dressing; Mod A; Initial  Lower Body Dressing Goal #1: Lower extremity dressing; Supine, Supported short sit, Unsupported short sit; Max A; Initial  Bathing Goal #1: Bathe; Supported short sit, Unsupported short sit, Wheelchair; Max A; Initial  Toileting and Transfers Goal #1: Toilet transfers; Max A; Bedside commode; Drop arm commode, Elevated toilet seat, Grab bars, Sliding board; Initial  Toileting and Transfers Goal #2: Toileting; Max A; Drop arm commode, Elevated toilet seat, Grab bars, Sliding board; Initial  Balance Goal #1: Unsupported short sit; Static sitting; CGA; 5; Improve independence with activities of daily living; Initial     OT Plan :   Treatment Frequency: Daily Performed By: Carilyn Goodpasture M   03/30/2016  Treatment Duration: 3 Performed By: Katherine Roan   03/30/2016  Planned Treatments:  Balance training, Basic Activities of Daily Living, Caregiver training, Coordination, Energy conservation training, Equipment training, Group therapy, Mobility training, Patient education, Safety education, Therapeutic activities, Therapeutic exercises... Performed By: Katherine Roan   03/30/2016     Occupational Therapy Orders :   Occupational Therapy Inpatient Additional Treatment Rehab - 03/30/16 16:40:41 EDT, Balance training, Basic Activities of Daily Living, Caregiver training, Coordination, Energy conservation training, Equipment training, Group therapy, Mobility training, Patient education, Safety education, Therapeutic activitie...  OT FIMS - 03/29/16 19:38:00 EDT, Daily     Pain Present :   No actual or suspected pain   OT Therapeutic Activity,Mobility,Balance :   Yes   OT Neuromuscular Reeducation :   Yes   WATERS, OT, MANDI R - 04/02/2016 11:45 EDT   Therapeutic Activities   OT Therapeutic Activities RTF :   Therapeutic Activities    No qualifying data available     WATERS, OT, MANDI R - 04/02/2016 11:45 EDT   OT Therapeutic Activities Grid     Activity 1          Comments :    Pt total  A to completed LB dressing and clean up after having accident. Pt req total A for rolling to B sides. Hoyer <> w/c <>bed.  WATERS, OT, MANDI R - 04/02/2016 11:45 EDT         Neuromuscular Reeducation   OT Neuromuscular Reeducation RTF :   Neuromuscular Reeducation    No qualifying data available     WATERS, OT, MANDI R - 04/02/2016 11:45 EDT   OT Neuromuscular Reeducation Grid     Activity 1          Comments :    Pt completed B UE AAROM with GE and B shlds at 90* in all planes. Pt c/o shld pain in L shld completing elbow flex with shld at 90*. No pain at  45* to complete movement. Decreased weakness in R UE distally then L.                WATERS, OT, MANDI R - 04/02/2016 11:45 EDT         Modalities   Modalities     Activity 1          Modality :    Electrical stimulation, attended              Body Region  :    R wrist ext              Settings :    43              Minutes :    20 minutes              Response :    Positive response to treatment, Tolerated well              Comments  (Comment: Pt tolerated estim to R wrist ext for 20 min with good response to increase strength to assist with increased function in all tasks.  [WATERS, OT, MANDI R - 04/02/2016 14:56 EDT] )         WATERS, OT, MANDI R - 04/02/2016 14:56 EDT         Assessment   OT Impairments or Limitations :   Balance deficits, Basic activity of daily living deficits, Coordination deficits, Endurance deficits, Equipment training, IADL deficits, Mobility deficits, Proprioception deficits, Safety awareness deficits, Strength deficits   OT Discharge Recommendations :   ELOS 3 weeks then home with spouse and power w/c     OT Treatment Recommendations :   Pt continues to req skilled OT services to increase strength, AROM, transfers, and ADL's.      WATERS, OT, MANDI R - 04/02/2016 11:45 EDT   Time Spent With Patient   OT Time In 2 :   13:30 EST   OT Time Out 2 :   14:00 EST   WATERS, OT, MANDI R - 04/02/2016 14:56 EDT   OT Time In :   10:30 EST   OT Time Out :   11:30 EST   OT Attended E-Stim Time :   30 minutes   OT Attended E-Stim Units :   2 units   OT Neuromuscular Reeducation Units :   4 units   OT Neuromuscular Reeducation Time :   60 minutes   OT Total Individual Therapy Time :   90 minutes   OT Total Timed Code Treatment Units :   6 units   OT Total Timed Code Treatment Minutes :   90 minutes   OT Total Treatment Time Rehab :   90 minutes   WATERS, OT, MANDI R - 04/02/2016 11:45 EDT

## 2016-04-02 NOTE — Progress Notes (Signed)
PT Inpatient Daily Documentation - Text       PT Inpatient Daily Documentation Entered On:  04/02/2016 11:50 EDT    Performed On:  04/02/2016 8:30 EDT by Eliseo GumBENTON, PT, AMBER R               Reason for Treatment   *Reason for Referral :   Progressive MS (dx 1989)  new weakness following UTI and C-diff    3 hrs/day     *Chief Complaint :   weakness bilat LE's, tightness R ankle/calf     BENTON, PT, AMBER R - 04/02/2016 11:41 EDT   Review/Treatments Provided   PT Goals :   PT Short Term Goals    04/01/2016  Bed Mobility Goal #1: Roll to right and left; Mod A; Progressing, continue  Transfer Goal #1: Transfer board; Mod A; Initial  Wheelchair Management Goal #1: Power wheelchair mobility indoors; Minimal assistance; Initial  Wheelchair Management Goal #2: Pressure relief tilt in space (power wheelchair); Minimal assistance; Initial  Balance Goal #1: Unsupported short sit; Mod A; Static sitting; 2; Improve independence with activities of daily living; Initial     PT Plan :   Treatment Frequency:  Daily (modified)   Performed By: Eliseo GumBENTON, PT, AMBER R  03/30/2016 09:30  Treatment Duration: 3 Performed By: Carney LivingBENTON, PT, AMBER R  03/30/2016 09:30  Planned Treatments: Balance training, Basic activities of daily living, Bed mobility training, Caregiver training, Electrical stimulation, Equipment training, Functional training, Manual therapy, Neuromuscular reeducation, Pain management, Posture/Body mechanics training,... Performed By: Carney LivingBENTON, PT, AMBER R  03/30/2016 09:30     Short Term Goals Reviewed :   Yes   Physical Therapy Orders :   Physical Therapy Inpatient Additional Treatment Rehab - 03/30/16 11:39:51 EDT, Balance training, Basic activities of daily living, Bed mobility training, Caregiver training, Electrical stimulation, Equipment training, Functional training, Manual therapy, Neuromuscular reeducation, Pain management, Posture/...  PT FIMS - 03/29/16 19:38:00 EDT, Daily     Pain Present :   No actual or suspected pain   PT  Therapeutic Activity,Mobility,Balance :   Yes   BENTON, PT, AMBER R - 04/02/2016 11:41 EDT   Therapeutic Activities/Mobility/Balance   Functional Activity :   Therapeutic Activities  Activity 1:  Transfer training; Max A; Unsupported short sit; Sliding board; Bed<>W/C via slideboard requiring maxA due to poor sitting balance, trunk control and UE strength.       Performed Date:  04/01/2016  Activity 2:  Static sitting balance; Mod A; Supported short sit; Pt sat EOB for 5 minutes requiring modA to maintain balance with BUE support due to poor trunk and UE strength.       Performed Date:  04/01/2016     Carney LivingBENTON, PT, AMBER R - 04/02/2016 11:41 EDT   PT Therapeutic Activities Grid     Activity 1  Activity 2  Activity 3  Activity 4    Activity :    Transfer training   Static sitting balance      Transitional movement     Assist :    Maximal assistance   Close supervision, Minimal assistance           Equipment :    Sliding board              Response :    Demonstrated positive response to treatment, Improved stability in muscle group(s), Improved timing, control, and coordination, Increased activation of targeted muscle group(s)   Demonstrated positive response to treatment, Improved stability in muscle  group(s), Improved timing, control, and coordination, Increased activation of targeted muscle group(s)           Comment :    SB transfers x 3 to R and L from bed and mat with max A for increased ant WS, lateral hip movement and dynamic sitting balance.    static sitting balance initially with B UE support, slight R lateral shift 2* decr ROM; able to sustain 3 x 2 min without LOB-required rest breaks 2* fatique. Progressed to sitting with B UE on LE for narrow BOS, able to sustain 3 x 1 min without (cont)   (cont) LOB; VC for ant/post WS. Progressed to alt lift unilat UE off LE for intermittent unilat UE support sitting balance. Occ LOB R and post requiring assist to correct.    lateral propped on L elbow to encourage  appropriate trunk righting  and improve trunk ROM to L sidebending. Balance on L propped elbow with close SPV and VC for use of head/shoulders to control balance position.        Eliseo Gum, PT, AMBER R - 04/02/2016 11:41 EDT  Eliseo Gum, PT, AMBER R - 04/02/2016 11:41 EDT  BENTON, PT, AMBER R - 04/02/2016 11:41 EDT  BENTON, PT, AMBER R - 04/02/2016 11:41 EDT      Reassess Mobility :   Yes   BENTON, PT, AMBER R - 04/02/2016 11:41 EDT   PT Mobility   Mobility Grid   Roll Left :   Rehab Maximal assistance   Supine to Sit :   Rehab Maximal assistance   Transfer Bed to and From Chair :   Rehab Maximal assistance   BENTON, PT, AMBER R - 04/02/2016 11:41 EDT   Amb Ability Varied Surf/Distraction Grid   Level Surfaces :   Does not occur   Uneven Surfaces :   Does not occur   Distracting Environments :   Does not occur   Curbs :   Does not occur   Stairs :   Does not occur   Ramp :   Does not occur   BENTON, PT, AMBER R - 04/02/2016 11:41 EDT   Transfer Type :   Transfer board   PT Mobility Reviewed :   Yes   BENTON, PT, AMBER R - 04/02/2016 11:41 EDT   Short Term Goals   Bed Mobility Goal Grid     Goal #1          Descriptors :    Roll to right and left              Level :    Moderate assistance              Status :    Progressing, continue                BENTON, PT, AMBER R - 04/02/2016 11:41 EDT         Transfers Goal Grid     Goal #1          Descriptors :    Transfer board              Level :    Moderate assistance              Status :    Progressing, continue                BENTON, PT, AMBER R - 04/02/2016 11:41 EDT         W/C Management Grid  Goal #1  Goal #2        Descriptors :    Power wheelchair mobility indoors   Pressure relief tilt in space (power wheelchair)           Level :    Minimal assistance   Minimal assistance           Status :    Progressing, continue   Progressing, continue             BENTON, PT, AMBER R - 04/02/2016 11:41 EDT  BENTON, PT, AMBER R - 04/02/2016 11:41 EDT        PT Balance Goal Grid     Goal #1           Descriptor :    Unsupported short sit              Assist Level :    Moderate assistance              Type :    Static sitting              Length of Time (minutes) :    2 minutes              Rationale :    Improve independence with activities of daily living              Status :    Progressing, continue                BENTON, PT, AMBER R - 04/02/2016 11:41 EDT         PT ST Goals Reviewed :   Silvio Clayman, PT, AMBER R - 04/02/2016 11:41 EDT   Education   Home Caregiver Name/Relationship :   Daphene Jaeger, PT, AMBER R - 04/02/2016 11:41 EDT   Physical Therapy Education Grid   Bed to Chair Transfers :   TEFL teacher understanding, Paediatric nurse, Needs further teaching, Needs Scientist, forensic :   Verbalizes understanding, Demonstrates, Needs further teaching, Needs practice/supervision   Physical Therapy Plan of Care :   Verbalizes understanding, Demonstrates, Needs further teaching, Needs practice/supervision   Wheelchair Positioning :   Verbalizes understanding, Demonstrates, Needs further teaching, Needs practice/supervision   BENTON, PT, AMBER R - 04/02/2016 11:41 EDT   PT Additional Education :   disscussed options for ordering new components of PWC     BENTON, PT, AMBER R - 04/02/2016 11:41 EDT   Assessment   PT Impairments or Limitations :   Abnormal tone, Balance deficits, Bed mobility deficits, Coordination/Proprioception deficits, Endurance deficits, Equipment training, Impaired sensation, Range of motion deficits, Strength deficits, Transfer deficits, Transition deficits, Wheelchair mobility deficits   Barriers to Safe Discharge PT :   Medical diagnosis   Discharge Recommendations :   ELOS: 3 weeks; home with assist and PWC     PT Treatment Recommendations :   Pt demonstrates improved static sitting balance EOM today. She will cont to benefit from skilled PT to address above impairments and progress indep with functional mobility and transfers prior to DC home.       BENTON, PT, AMBER R - 04/02/2016 11:41 EDT   Time Spent With Patient   PT Time In :   8:30 EST   PT Time Out :   10:00 EST   PT ADL TRAINING 15 MN :   2 units   PT ADL Training Time :   30 minutes  PT NEUROMUSC RE-EDUC 15 MIN :   4 units   PT Neuromuscular Reeducation Time :   60 minutes   PT Total Individual Therapy Time :   90 minutes   PT Total Timed Code Treatment Units :   6 units   PT Total Timed Code Tx Minutes :   90 minutes   PT Total Treatment Time Rehab :   90 minutes   BENTON, PT, AMBER R - 04/02/2016 11:41 EDT

## 2016-04-02 NOTE — Progress Notes (Signed)
Functional Indep Measure Scores - Text       Functional Independence Measure Scores Entered On:  04/02/2016 14:58 EDT    Performed On:  04/02/2016 14:56 EDT by Kyung RuddKENNEDY, RN, MANUVETTE E               Eating Score   Patient's Independence Level With Eating Tasks :   Setup/Supervision   Supervision or Setup Needed :   Gather equipment, Open containers   Functional Independence Measure Eating :   Supervision or setup   KENNEDY, RN, MANUVETTE E - 04/02/2016 14:56 EDT   Bladder Management Score   Patient's Independence Level with Bladder Management Tasks :   Assistance   Type of Assistance Necessary :   Helper positions and holds bedpan, assists patient to roll on OR off the bedpan, Helper changes linen or clothing/cleans up spill, Helper changes diaper or absorbent pad   Functional Independence Measure Bladder Management :   Total assistance   KENNEDY, RN, MANUVETTE E - 04/02/2016 14:56 EDT   Bowel Management Score   Patient's Independence Level with Bowel Management Tasks :   Assistance   Type of Assistance Necessary :   Helper administers and/or removes bedpan, patient positions self, Helper changes linen or clothing/cleans up spill, Helper changes diaper   Functional Independence Measure Bowel Management :   Total assistance   KENNEDY, RN, MANUVETTE E - 04/02/2016 14:56 EDT   Transfer Toilet Score   Patient's Independence Level with Transfer Toilet Tasks :   Assistance   Amount of Assistance Necessary :   Mechanical lift required, Total assist (patient performs less than 25%)   Transfer Toilet :   Total assistance   Kyung RuddKENNEDY, RN, MANUVETTE E - 04/02/2016 14:56 EDT

## 2016-04-02 NOTE — Progress Notes (Signed)
Functional Indep Measure Scores - Text       Functional Independence Measure Scores Entered On:  04/02/2016 11:45 EDT    Performed On:  04/02/2016 10:30 EDT by WATERS, OT, MANDI R               Transfer Bed/Chair/WC Score   Patient's independence Level with Bed, Chair, Wheelchair Tasks :   Assistance   Type of Assistance Necessary :   Total assist (patient performs less than 25%)   Bed, Chair, Wheelchair Transfer :   Total assistance   WATERS, OT, MANDI R - 04/02/2016 11:44 EDT   Transfer Toilet Score   Patient's Independence Level with Transfer Toilet Tasks :   Assistance   Amount of Assistance Necessary :   Total assist (patient performs less than 25%)   Transfer Toilet :   Total assistance   WATERS, OT, MANDI R - 04/02/2016 11:44 EDT   Comprehension Score   Mode of Comprehension :   Auditory   Comprehends Complex or Abstract Information Without Prompting or Cueing :   Yes   Understands Complex or Abstract Directions and Conversations :   At all times   Comprehension Indep Measure Interim :   Complete independence   WATERS, OT, MANDI R - 04/02/2016 11:44 EDT   Expression Score   Expression Mode :   Vocal   Expresses Complex or Abstract Information Without Prompting or Cueing :   Yes   Expresses Complex or Abstract Ideas :   Clearly and fluently at all times   Expression Indep Measure Interim :   Complete independence   WATERS, OT, MANDI R - 04/02/2016 11:44 EDT   Social Interaction Score   Interacts Appropriately Without Supervision :   Yes   Interacts Appropriately :   At all times   Social Interaction Indep Measure Interim :   Complete independence   WATERS, OT, MANDI R - 04/02/2016 11:44 EDT   Problem Solving Score   Solves Complex Problems :   Yes   Ability to Solve Complex Problems :   Consistently solves problems independently   Problem Solving Indep Measure Interim :   Complete independence   WATERS, OT, MANDI R - 04/02/2016 11:44 EDT   Memory Score   Recognizes, Remembers Routines, and Executes Requests Without  Prompting :   Yes   Remembers and Executes Requests :   Consistently without need for repetition   Memory Indep Measure Interim :   Complete independence   WATERS, OT, MANDI R - 04/02/2016 11:44 EDT

## 2016-04-02 NOTE — Consults (Signed)
Consultation    [facility]  Traylen Eckels L. Drinda Belgard, MD  Service Date: 04/02/2016    ATTENDING PHYSICIAN:  Dr. Joslyn HyWarmoth    IMPRESSION:  Secondary progressive multiple sclerosis.  There is no  approved immunomodulatory therapy for secondary progressive multiple  sclerosis.    RECOMMENDATIONS:  1.  No present indication for further neurodiagnostic testing.  2.  May continue Ampyra as the patient reports subjective worsening of  weakness off of this medication.  3.  There is no present indication for further immunomodulatory  therapy.    HISTORY OF PRESENT ILLNESS:  The patient is a 82 year old right-handed  female admitted on 03/29/2016.  History obtained from the patient and  supplemented by medical record review.    She reports the onset of lower extremity numbness in 1984.  She was  ultimately diagnosed as having multiple sclerosis later that year.   She [place]  [place].  Over the years, she has been maintained on multiple  immunomodulating agents including Copaxone, Betaseron, Tysabri, and  Avonex.  She has experienced gradual deterioration on all of these.   She has received intravenous high-dose methylprednisolone on multiple  occasions including within the past few weeks.  Steroid therapy has  not produced any sustained symptomatic benefit.  She has been  nonambulatory for years and is maintained on Ampyra which is generally  used to improve ambulation.  However, off of this medication, she  feels definitely weaker.    The patient has been nonambulatory for several years, had been able to  transfer unassisted.  She has chronic incontinence.  She does not  describe significant pain.  She has had recent CT brain which  demonstrates chronic demyelination consistent with MS.  She has had  superimposed medical problems as detailed in the admission history and  physical which have caused worsening of chronic weakness.  She does  have an InterStim bladder  stimulator in place.    PAST MEDICAL HISTORY:  MS, chronic venous insufficiency, recurrent C.  diff colitis, hypothyroidism, recurrent UTI, chronic constipation.    PAST SURGERIES:  InterStim bladder stimulator, right ulnar nerve  procedure with chronic severe right ulnar neuropathy.    FAMILY HISTORY:  No family history of multiple sclerosis or other  neurologic illness.    SOCIAL HISTORY:  The patient is married.  She is a retired Nurse, mental healthschool  teacher.  Occasional alcohol use, no cigarette use.    SYSTEMS REVIEW:  Twelve-system review as documented, no pertinent  additions.    PHYSICAL EXAMINATION:  The patient is alert, lucid, and describes  symptoms in a precise manner in no acute distress.  Temperature 36.8,  blood pressure 114/85.  Speech normally articulated and fluent.   Pupils equal.  Motility full and conjugate.  Facial movements  symmetric.  No difficulty with secretions.  Upper extremity motor exam  is 3/5 proximally and distally.  The patient cannot elevate arms to  horizontal.  There is obvious intrinsic muscle wasting right hand  consistent with chronic ulnar neuropathy, basic grasp function only  right hand, weakened grip 3/5 left hand.  Lower extremities are 1/5  with only a flicker of movement.  Upper extremity reflexes are 1+,  absent lower extremities, extensor is plantar to neutral.    Current MAR is reviewed.  Recent lab reviewed.    Thank you very much for this consultation.      Karin LieuJames L Jiyah Torpey, MD  TR: *n DD: 04/02/2016  17:22 TD: 04/02/2016 17:50 Job#: [id number]  \\[id number]\\DOC#: [id number]  \\[id number]\\      cc:  Ardelle Park MD  Signature Line    Electronically Signed on 04/02/2016 08:49 PM EDT  ________________________________________________  Jesse Sans

## 2016-04-03 NOTE — Progress Notes (Signed)
Functional Indep Measure Scores - Text       Functional Independence Measure Scores Entered On:  04/03/2016 1:12 EDT    Performed On:  04/03/2016 1:11 EDT by Milus Banister, LPN, Shaune Pollack               Toileting Score   Patient's Independence Level with Toileting Tasks :   Does not occur   Toileting :   Does not occur   Toileting Score Comments :   on nights pt require lifts   ROUSE, LPN, Shaune Pollack - 1/88/4166 1:11 EDT   Bladder Management Score   Patient's Independence Level with Bladder Management Tasks :   Assistance   Type of Assistance Necessary :   Helper positions, holds, and removes bedpan, Helper changes linen or clothing/cleans up spill, Helper changes diaper or absorbent pad   Functional Independence Measure Bladder Management :   Total assistance   ROUSE, LPN, Shaune Pollack - 0/63/0160 1:11 EDT   Bowel Management Score   Patient's Independence Level with Bowel Management Tasks :   Assistance   Type of Assistance Necessary :   Helper positions, holds, and removes bedpan, Helper changes linen or clothing/cleans up spill   Functional Independence Measure Bowel Management :   Total assistance   ROUSE, LPN, Shaune Pollack - 07/16/3233 1:11 EDT

## 2016-04-03 NOTE — Progress Notes (Signed)
PT Inpatient Daily Documentation - Text       PT Inpatient Daily Documentation Entered On:  04/03/2016 13:37 EDT    Performed On:  04/03/2016 9:30 EDT by Eliseo Gum, PT, AMBER R               Reason for Treatment   *Reason for Referral :   Progressive MS (dx 1989)  new weakness following UTI and C-diff    3 hrs/day     *Chief Complaint :   weakness bilat LE's, tightness R ankle/calf     BENTON, PT, AMBER R - 04/03/2016 13:29 EDT   Review/Treatments Provided   PT Goals :   PT Short Term Goals    04/02/2016  Bed Mobility Goal #1: Roll to right and left; Mod A; Progressing, continue  Transfer Goal #1: Transfer board; Mod A; Progressing, continue  Wheelchair Management Goal #1: Power wheelchair mobility indoors; Minimal assistance; Progressing, continue  Wheelchair Management Goal #2: Pressure relief tilt in space (power wheelchair); Minimal assistance; Progressing, continue  Balance Goal #1: Unsupported short sit; Mod A; Static sitting; 2; Improve independence with activities of daily living; Progressing, continue     PT Plan :   Treatment Frequency:  Daily (modified)   Performed By: Eliseo Gum, PT, AMBER R  03/30/2016 09:30  Treatment Duration: 3 Performed By: Carney Living, AMBER R  03/30/2016 09:30  Planned Treatments: Balance training, Basic activities of daily living, Bed mobility training, Caregiver training, Electrical stimulation, Equipment training, Functional training, Manual therapy, Neuromuscular reeducation, Pain management, Posture/Body mechanics training,... Performed By: Carney Living, AMBER R  03/30/2016 09:30     Short Term Goals Reviewed :   Yes   Physical Therapy Orders :   Physical Therapy Inpatient Additional Treatment Rehab - 03/30/16 11:39:51 EDT, Balance training, Basic activities of daily living, Bed mobility training, Caregiver training, Electrical stimulation, Equipment training, Functional training, Manual therapy, Neuromuscular reeducation, Pain management, Posture/...  PT FIMS - 03/29/16 19:38:00 EDT,  Daily     Pain Present :   No actual or suspected pain   PT Therapeutic Activity,Mobility,Balance :   Yes   PT Wheelchair Management :   Yes   BENTON, PT, AMBER R - 04/03/2016 13:29 EDT   Therapeutic Activities/Mobility/Balance   Functional Activity :   Therapeutic Activities  Activity 1:  Transfer training; Max A; Sliding board; Demonstrated positive response to treatment, Improved stability in muscle group(s), Improved timing, control, and coordination, Increased activation of targeted muscle group(s); SB transfers x 3 to R and L from bed and mat with max A for increased ant WS, lateral hip movement and dynamic sitting balance.       Performed Date:  04/02/2016  Activity 2:  Static sitting balance; Close S, Minimal assistance; Demonstrated positive response to treatment, Improved stability in muscle group(s), Improved timing, control, and coordination, Increased activation of targeted muscle group(s); static sitting balance initially with B UE support, slight R lateral shift 2* decr ROM; able to sustain 3 x 2 min without LOB-required rest breaks 2* fatique. Progressed to sitting with B UE on LE for narrow BOS, able to sustain 3 x 1 min without (cont)       Performed Date:  04/02/2016  Activity 3:  (cont) LOB; VC for ant/post WS. Progressed to alt lift unilat UE off LE for intermittent unilat UE support sitting balance. Occ LOB R and post requiring assist to correct.       Performed Date:  04/02/2016  Activity 4:  Transitional movement;  lateral propped on L elbow to encourage appropriate trunk righting  and improve trunk ROM to L sidebending. Balance on L propped elbow with close SPV and VC for use of head/shoulders to control balance position.       Performed Date:  04/02/2016     Reassess Mobility :   Yes   BENTON, PT, AMBER R - 04/03/2016 13:29 EDT   PT Mobility   Mobility Grid   Roll Left :   Rehab Maximal assistance   Roll Right :   Rehab Total assistance   Roll Supine :   Rehab Total assistance   Supine to Sit  :   Rehab Maximal assistance   Sit to Supine :   Rehab Total assistance   Scooting :   Rehab Total assistance   Transfer Bed to and From Chair :   Rehab Maximal assistance   Transfer Toilet :   Total assistance   BENTON, PT, AMBER R - 04/03/2016 13:29 EDT   Amb Ability Varied Surf/Distraction Grid   Level Surfaces :   Does not occur   Uneven Surfaces :   Does not occur   Distracting Environments :   Does not occur   Curbs :   Does not occur   Stairs :   Does not occur   Ramp :   Does not occur   BENTON, PT, AMBER R - 04/03/2016 13:29 EDT   Transfer Type :   Transfer board   PT Mobility Reviewed :   Yes   BENTON, PT, AMBER R - 04/03/2016 13:29 EDT   WC Management   Type of Wheelchair :   Power wheelchair   Wheelchair Details :   Adjustments made to [facility] for optimal positioning for access to joystick and pressure distribution including backrest, lateral support, armrests, headrest and footplates   BENTON, PT, AMBER R - 04/03/2016 13:29 EDT   Wheelchair Mobility   Level Surfaces :   Distant supervision   BENTON, PT, AMBER R - 04/03/2016 13:29 EDT   Wheelchair Mobility Level Distance Daily :   200    BENTON, PT, AMBER R - 04/03/2016 13:29 EDT   Short Term Goals   Bed Mobility Goal Grid     Goal #1          Descriptors :    Roll to right and left              Level :    Moderate assistance              Status :    Progressing, continue                BENTON, PT, AMBER R - 04/03/2016 13:29 EDT         Transfers Goal Grid     Goal #1          Descriptors :    Transfer board              Level :    Moderate assistance              Status :    Progressing, continue                BENTON, PT, AMBER R - 04/03/2016 13:29 EDT         W/C Management Grid     Goal #1  Goal #2        Descriptors :    Power wheelchair mobility indoors  Pressure relief tilt in space (power wheelchair)           Level :    Minimal assistance   Minimal assistance           Status :    Progressing, continue   Progressing, continue             BENTON, PT, AMBER  R - 04/03/2016 13:29 EDT  BENTON, PT, AMBER R - 04/03/2016 13:29 EDT        PT Balance Goal Grid     Goal #1          Descriptor :    Unsupported short sit              Assist Level :    Moderate assistance              Type :    Static sitting              Length of Time (minutes) :    2 minutes              Rationale :    Improve independence with activities of daily living              Status :    Progressing, continue                BENTON, PT, AMBER R - 04/03/2016 13:29 EDT         PT ST Goals Reviewed :   Yes   BENTON, PT, AMBER R - 04/03/2016 13:29 EDT   Assessment   PT Impairments or Limitations :   Abnormal tone, Balance deficits, Bed mobility deficits, Coordination/Proprioception deficits, Endurance deficits, Equipment training, Impaired sensation, Range of motion deficits, Strength deficits, Transfer deficits, Transition deficits, Wheelchair mobility deficits   Barriers to Safe Discharge PT :   Medical diagnosis   Discharge Recommendations :   ELOS: 3 weeks; home with assist and PWC     PT Treatment Recommendations :   Pt demos improved seated posture in PWC and incr indep with ability to operate at SPV level. She will cont to benefit from skilled PT to address above impairments and progress indep with functional mobility and transfers prior this afternoon.      BENTON, PT, AMBER R - 04/03/2016 13:29 EDT   Time Spent With Patient   PT Time In :   9:30 EST   PT Time Out :   10:00 EST   PT Time In 2 :   11:00 EST   PT Time Out 2 :   12:00 EST   PT ADL TRAINING 15 MN :   2 units   PT ADL Training Time :   30 minutes   PT Wheelchair Management Units :   4 units   PT Wheelchair Management Time :   60 minutes   PT Total Individual Therapy Time :   90 minutes   PT Total Timed Code Treatment Units :   6 units   PT Total Timed Code Tx Minutes :   90 minutes   PT Total Treatment Time Rehab :   90 minutes   BENTON, PT, AMBER R - 04/03/2016 13:29 EDT

## 2016-04-03 NOTE — Progress Notes (Signed)
Functional Indep Measure Scores - Text       Functional Independence Measure Scores Entered On:  04/03/2016 14:36 EDT    Performed On:  04/03/2016 13:00 EDT by WATERS, OT, MANDI R               Comprehension Score   Mode of Comprehension :   Auditory   Comprehends Complex or Abstract Information Without Prompting or Cueing :   Yes   Understands Complex or Abstract Directions and Conversations :   At all times   Comprehension Indep Measure Interim :   Complete independence   WATERS, OT, MANDI R - 04/03/2016 14:36 EDT   Expression Score   Expression Mode :   Vocal   Expresses Complex or Abstract Information Without Prompting or Cueing :   Yes   Expresses Complex or Abstract Ideas :   Clearly and fluently at all times   Expression Indep Measure Interim :   Complete independence   WATERS, OT, MANDI R - 04/03/2016 14:36 EDT   Social Interaction Score   Interacts Appropriately Without Supervision :   Yes   Interacts Appropriately :   At all times   Social Interaction Indep Measure Interim :   Complete independence   WATERS, OT, MANDI R - 04/03/2016 14:36 EDT   Problem Solving Score   Solves Complex Problems :   Yes   Ability to Solve Complex Problems :   Consistently solves problems independently   Problem Solving Indep Measure Interim :   Complete independence   WATERS, OT, MANDI R - 04/03/2016 14:36 EDT   Memory Score   Recognizes, Remembers Routines, and Executes Requests Without Prompting :   Yes   Remembers and Executes Requests :   Consistently without need for repetition   Memory Indep Measure Interim :   Complete independence   WATERS, OT, MANDI R - 04/03/2016 14:36 EDT

## 2016-04-03 NOTE — Nursing Note (Signed)
Medication Administration Follow Up-Text       Medication Administration Follow Up Entered On:  04/03/2016 2:41 EDT    Performed On:  04/02/2016 23:42 EDT by Milus BanisterOUSE, LPN, Shaune PollackJOANN F      Intervention Information:     zolpidem  Performed by Milus BanisterOUSE, LPN, Shaune PollackJOANN F on 04/02/2016 22:42:00 EDT       zolpidem,5mg   Oral,sleep       Medication Effectiveness Evaluation   Medication Administration Reason :   Insomnia   Medication Response :   Symptoms improved   ROUSE, LPN, Shaune PollackJOANN F - 9/60/45409/27/2017 2:41 EDT

## 2016-04-03 NOTE — Progress Notes (Signed)
OT Inpatient Daily Documentation - Text       OT Inpatient Daily Documentation Entered On:  04/03/2016 14:39 EDT    Performed On:  04/03/2016 13:00 EDT by WATERS, OT, MANDI R               Reason for Treatment   *Reason for Referral :   Admitted for MS exacerbation    82 yo female with past medical history of progressive MS diagnosed in 1989, CVA with right sided weakness, hypothyroidism and neurogenic bladder admitted to [facility] in [place], [place] on 03/06/16 with recurrent generalized weakness of the upper and lower extremities and poor posture. She has been wheelchair bound for the past few years due to right-sided weakness.    (per pt, she did not have a CVA)     *Chief Complaint :   weakness     WATERS, OT, MANDI R - 04/03/2016 14:36 EDT   Review/Treatments Provided   OT Goals :   OT Short Term Goals    03/31/2016  Eating Goal #1: Manage food containers/packages; Distant S; Initial  Grooming Goal #1: Grooming routine; Supported short sit, Unsupported short sit, Wheelchair; CGA; Initial  Upper Body Dressing Goal #1: Upper extremity dressing; Mod A; Initial  Lower Body Dressing Goal #1: Lower extremity dressing; Supine, Supported short sit, Unsupported short sit; Max A; Initial  Bathing Goal #1: Bathe; Supported short sit, Unsupported short sit, Wheelchair; Max A; Initial  Toileting and Transfers Goal #1: Toilet transfers; Max A; Bedside commode; Drop arm commode, Elevated toilet seat, Grab bars, Sliding board; Initial  Toileting and Transfers Goal #2: Toileting; Max A; Drop arm commode, Elevated toilet seat, Grab bars, Sliding board; Initial  Balance Goal #1: Unsupported short sit; Static sitting; CGA; 5; Improve independence with activities of daily living; Initial     OT Plan :   Treatment Frequency: Daily Performed By: Carilyn Goodpasture M   03/30/2016  Treatment Duration: 3 Performed By: Katherine Roan   03/30/2016  Planned Treatments: Balance training, Basic Activities of Daily Living, Caregiver  training, Coordination, Energy conservation training, Equipment training, Group therapy, Mobility training, Patient education, Safety education, Therapeutic activities, Therapeutic exercises... Performed By: Katherine Roan   03/30/2016     Occupational Therapy Orders :   Occupational Therapy Inpatient Additional Treatment Rehab - 03/30/16 16:40:41 EDT, Balance training, Basic Activities of Daily Living, Caregiver training, Coordination, Energy conservation training, Equipment training, Group therapy, Mobility training, Patient education, Safety education, Therapeutic activitie...  OT FIMS - 03/29/16 19:38:00 EDT, Daily     Pain Present :   No actual or suspected pain   OT Modalities :   Yes   WATERS, OT, MANDI R - 04/03/2016 14:36 EDT   Modalities   Modalities     Activity 1          Modality :    Electrical stimulation, attended              Body Region :    R scap retract, R elbow flex/ext, R wrist ext, L scap retraction, L ant delt.              Settings :    Setting on FES              Minutes :    30 minutes              Response :    Positive response to treatment, Tolerated well  Comments  (Comment: Pt set up and completed FES bike to increase strength in B UE's for increased functional abilities.  [WATERS, OT, MANDI R - 04/03/2016 14:36 EDT] )         WATERS, OT, MANDI R - 04/03/2016 14:36 EDT         Assessment   OT Impairments or Limitations :   Balance deficits, Basic activity of daily living deficits, Coordination deficits, Endurance deficits, Equipment training, IADL deficits, Mobility deficits, Proprioception deficits, Safety awareness deficits, Strength deficits   OT Discharge Recommendations :   ELOS 3 weeks then home with spouse and power w/c     OT Treatment Recommendations :   Pt continues to req skilled OT services to increase strength, mobility, ADL's, endurance, and functional reach.      WATERS, OT, MANDI R - 04/03/2016 14:36 EDT   Time Spent With Patient   OT Attended E-Stim Time  :   90 minutes   OT Attended E-Stim Units :   6 units   OT Total Individual Therapy Time :   90 minutes   OT Total Timed Code Treatment Units :   6 units   OT Total Timed Code Treatment Minutes :   90 minutes   OT Total Treatment Time Rehab :   90 minutes   WATERS, OT, MANDI R - 04/03/2016 14:54 EDT   OT Time In :   13:00 EST   OT Time Out :   14:30 EST   WATERS, OT, MANDI R - 04/03/2016 14:36 EDT

## 2016-04-04 NOTE — Progress Notes (Signed)
Functional Indep Measure Scores - Text       Functional Independence Measure Scores Entered On:  04/04/2016 11:52 EDT    Performed On:  04/04/2016 8:30 EDT by Eliseo GumBENTON, PT, AMBER R               Transfer Bed/Chair/WC Score   Patient's independence Level with Bed, Chair, Wheelchair Tasks :   Assistance   Type of Assistance Necessary :   Assistance for more than half (patient performs 25% - 49% of tasks)   Bed, Chair, Wheelchair Transfer :   Maximal assistance   CrawfordBENTON, PT, AMBER R - 04/04/2016 11:51 EDT   Walk/Wheelchair Score   Assessed Locomotion in a Wheelchair :   Yes   Distance Traveled in a Wheelchair :   200 ft   Amount of Assistance Necessary :   Standby supervision   Functional Independence Measure Wheelchair :   Supervision or setup   KirkwoodBENTON, PT, AMBER R - 04/04/2016 11:51 EDT

## 2016-04-04 NOTE — Progress Notes (Signed)
Functional Indep Measure Scores - Text       Functional Independence Measure Scores Entered On:  04/04/2016 1:59 EDT    Performed On:  04/04/2016 1:42 EDT by Milus BanisterOUSE, LPN, Shaune PollackJOANN [name]               Eating Score   Patient's Independence Level With Eating Tasks :   Setup/Supervision   Supervision or Setup Needed :   Gather equipment, Open containers, Prepare food for eating   Functional Independence Measure Eating :   Supervision or setup   ROUSE, LPN, Shaune PollackJOANN [name] - 1/47/82959/28/2017 1:42 EDT   Toileting Score   Patient's Independence Level with Toileting Tasks :   Does not occur   Toileting :   Does not occur   Toileting Score Comments :   on nights   ROUSE, LPN, Shaune PollackJOANN [name] - 6/21/30869/28/2017 1:42 EDT   Bladder Management Score   Patient's Independence Level with Bladder Management Tasks :   Assistance   Type of Assistance Necessary :   Helper positions and holds bedpan, assists patient to roll on OR off the bedpan, Helper changes linen or clothing/cleans up spill   Functional Independence Measure Bladder Management :   Total assistance   ROUSE, LPN, Shaune PollackJOANN [name] - 5/78/46969/28/2017 1:42 EDT   Bowel Management Score   Patient's Independence Level with Bowel Management Tasks :   Independent/Modified independence   InM Bowel Mgmt Indep or Modifications :   Requires medication at least once a week   Functional Independence Measure Bowel Management :   Modified independence   ROUSE, LPN, Shaune PollackJOANN [name] - 2/95/28419/28/2017 1:42 EDT

## 2016-04-04 NOTE — Progress Notes (Signed)
Functional Indep Measure Scores - Text       Functional Independence Measure Scores Entered On:  04/04/2016 13:17 EDT    Performed On:  04/04/2016 13:16 EDT by Maryann AlarLidstone, RN, Verlon AuLeslie               Eating Score   Patient's Independence Level With Eating Tasks :   Independent/Modified independence   Independent or Modifications Needed :   Complete independence   Functional Independence Measure Eating :   Complete independence   Riki AltesLidstone, RN, Leslie - 04/04/2016 13:16 EDT   Toileting Score   Patient's Independence Level with Toileting Tasks :   Assistance   Type of Assistance Necessary :   Requires assistance of 2 people   Toileting :   Total assistance   Riki AltesLidstone, RN, Leslie - 04/04/2016 13:16 EDT   Bladder Management Score   Patient's Independence Level with Bladder Management Tasks :   Assistance   Type of Assistance Necessary :   Helper positions and holds bedpan, assists patient to roll on AND off the bedpan, Helper changes linen or clothing/cleans up spill, Helper changes diaper or absorbent pad   Functional Independence Measure Bladder Management :   Total assistance   Riki AltesLidstone, RN, Leslie - 04/04/2016 13:16 EDT   Bowel Management Score   Patient's Independence Level with Bowel Management Tasks :   Assistance   Type of Assistance Necessary :   Helper positions and holds bedpan, assists patient to roll on OR off the bedpan, Helper changes linen or clothing/cleans up spill, Helper changes diaper   Functional Independence Measure Bowel Management :   Total assistance   Riki AltesLidstone, RN, Leslie - 04/04/2016 13:16 EDT

## 2016-04-04 NOTE — Nursing Note (Signed)
Medication Administration Follow Up-Text       Medication Administration Follow Up Entered On:  04/04/2016 6:30 EDT    Performed On:  04/04/2016 2:12 EDT by Milus BanisterOUSE, LPN, Shaune PollackJOANN F      Intervention Information:     tramadol  Performed by ROUSE, LPN, Shaune PollackJOANN F on 04/04/2016 01:12:00 EDT       tramadol,25mg   Oral,mild pain (1-3) or temp > 100.5 F       Medication Effectiveness Evaluation   Medication Administration Reason :   Pain   Medication Response :   Symptoms improved   ROUSE, LPN, Shaune PollackJOANN F - 1/61/09609/28/2017 6:30 EDT

## 2016-04-04 NOTE — Nursing Note (Signed)
Medication Administration Follow Up-Text       Medication Administration Follow Up Entered On:  04/04/2016 6:28 EDT    Performed On:  04/03/2016 23:33 EDT by Milus BanisterOUSE, LPN, Shaune PollackJOANN F      Intervention Information:     zolpidem  Performed by Milus BanisterOUSE, LPN, Shaune PollackJOANN F on 04/03/2016 22:33:00 EDT       zolpidem,5mg   Oral,sleep       Medication Effectiveness Evaluation   Medication Administration Reason :   Insomnia   Medication Response :   Symptoms improved   ROUSE, LPN, Shaune PollackJOANN F - 1/61/09609/28/2017 6:28 EDT

## 2016-04-04 NOTE — Progress Notes (Signed)
Functional Indep Measure Scores - Text       Functional Independence Measure Scores Entered On:  04/04/2016 15:02 EDT    Performed On:  04/04/2016 12:30 EDT by WATERS, OT, MANDI R               Toileting Score   Patient's Independence Level with Toileting Tasks :   Assistance   Type of Assistance Necessary :   Assistance with toileting tasks   Amount of Assistance Needed :   Adjusting clothing before toileting, Adjusting clothing after toileting, Cleansing perineal area   Toileting :   Total assistance   WATERS, OT, MANDI R - 04/04/2016 15:01 EDT   Transfer Toilet Score   Patient's Independence Level with Transfer Toilet Tasks :   Assistance   Amount of Assistance Necessary :   Mechanical lift required   Transfer Toilet :   Total assistance   WATERS, OT, MANDI R - 04/04/2016 15:01 EDT   Comprehension Score   Mode of Comprehension :   Auditory   Comprehends Complex or Abstract Information Without Prompting or Cueing :   Yes   Understands Complex or Abstract Directions and Conversations :   At all times   Comprehension Indep Measure Interim :   Complete independence   WATERS, OT, MANDI R - 04/04/2016 15:01 EDT   Expression Score   Expression Mode :   Vocal   Expresses Complex or Abstract Information Without Prompting or Cueing :   Yes   Expresses Complex or Abstract Ideas :   Clearly and fluently at all times   Expression Indep Measure Interim :   Complete independence   WATERS, OT, MANDI R - 04/04/2016 15:01 EDT   Social Interaction Score   Interacts Appropriately Without Supervision :   Yes   Interacts Appropriately :   At all times   Social Interaction Indep Measure Interim :   Complete independence   WATERS, OT, MANDI R - 04/04/2016 15:01 EDT   Problem Solving Score   Solves Complex Problems :   Yes   Ability to Solve Complex Problems :   Consistently solves problems independently   Problem Solving Indep Measure Interim :   Complete independence   WATERS, OT, MANDI R - 04/04/2016 15:01 EDT   Memory Score   Recognizes,  Remembers Routines, and Executes Requests Without Prompting :   Yes   Remembers and Executes Requests :   Consistently without need for repetition   Memory Indep Measure Interim :   Complete independence   WATERS, OT, MANDI R - 04/04/2016 15:01 EDT

## 2016-04-04 NOTE — Nursing Note (Signed)
Medication Administration Follow Up-Text       Medication Administration Follow Up Entered On:  04/04/2016 6:30 EDT    Performed On:  04/03/2016 23:33 EDT by Milus BanisterOUSE, LPN, Shaune PollackJOANN F      Intervention Information:     zolpidem  Performed by Milus BanisterOUSE, LPN, Shaune PollackJOANN F on 04/03/2016 22:33:00 EDT       zolpidem,2.5mg   Oral,sleep       Medication Effectiveness Evaluation   Medication Administration Reason :   Insomnia   Medication Response :   Symptoms improved   ROUSE, LPN, Shaune PollackJOANN F - 2/13/08659/28/2017 6:29 EDT

## 2016-04-04 NOTE — Progress Notes (Signed)
PT Inpatient Daily Documentation - Text       PT Inpatient Daily Documentation Entered On:  04/04/2016 11:51 EDT    Performed On:  04/04/2016 8:30 EDT by Eliseo Gum, PT, AMBER R               Reason for Treatment   *Reason for Referral :   Progressive MS (dx 1989)  new weakness following UTI and C-diff    3 hrs/day     *Chief Complaint :   weakness bilat LE's, tightness R ankle/calf     BENTON, PT, AMBER R - 04/04/2016 11:45 EDT   Review/Treatments Provided   PT Goals :   PT Short Term Goals    04/03/2016  Bed Mobility Goal #1: Roll to right and left; Mod A; Progressing, continue  Transfer Goal #1: Transfer board; Mod A; Progressing, continue  Wheelchair Management Goal #1: Power wheelchair mobility indoors; Minimal assistance; Progressing, continue  Wheelchair Management Goal #2: Pressure relief tilt in space (power wheelchair); Minimal assistance; Progressing, continue  Balance Goal #1: Unsupported short sit; Mod A; Static sitting; 2; Improve independence with activities of daily living; Progressing, continue     PT Plan :   Treatment Frequency:  Daily (modified)   Performed By: Eliseo Gum, PT, AMBER R  03/30/2016 09:30  Treatment Duration: 3 Performed By: Carney Living, AMBER R  03/30/2016 09:30  Planned Treatments: Balance training, Basic activities of daily living, Bed mobility training, Caregiver training, Electrical stimulation, Equipment training, Functional training, Manual therapy, Neuromuscular reeducation, Pain management, Posture/Body mechanics training,... Performed By: Carney Living, AMBER R  03/30/2016 09:30     Short Term Goals Reviewed :   Yes   Physical Therapy Orders :   Physical Therapy Inpatient Additional Treatment Rehab - 03/30/16 11:39:51 EDT, Balance training, Basic activities of daily living, Bed mobility training, Caregiver training, Electrical stimulation, Equipment training, Functional training, Manual therapy, Neuromuscular reeducation, Pain management, Posture/...  PT FIMS - 03/29/16 19:38:00 EDT,  Daily     Pain Present :   No actual or suspected pain   PT Therapeutic Activity,Mobility,Balance :   Yes   PT Wheelchair Management :   Yes   BENTON, PT, AMBER R - 04/04/2016 11:45 EDT   Therapeutic Activities/Mobility/Balance   Functional Activity :   Therapeutic Activities  Activity 1:  Transfer training; Max A; Sliding board; Demonstrated positive response to treatment, Improved stability in muscle group(s), Improved timing, control, and coordination, Increased activation of targeted muscle group(s); SB transfers x 3 to R and L from bed and mat with max A for increased ant WS, lateral hip movement and dynamic sitting balance.       Performed Date:  04/02/2016  Activity 2:  Static sitting balance; Close S, Minimal assistance; Demonstrated positive response to treatment, Improved stability in muscle group(s), Improved timing, control, and coordination, Increased activation of targeted muscle group(s); static sitting balance initially with B UE support, slight R lateral shift 2* decr ROM; able to sustain 3 x 2 min without LOB-required rest breaks 2* fatique. Progressed to sitting with B UE on LE for narrow BOS, able to sustain 3 x 1 min without (cont)       Performed Date:  04/02/2016  Activity 3:  (cont) LOB; VC for ant/post WS. Progressed to alt lift unilat UE off LE for intermittent unilat UE support sitting balance. Occ LOB R and post requiring assist to correct.       Performed Date:  04/02/2016  Activity 4:  Transitional movement;  lateral propped on L elbow to encourage appropriate trunk righting  and improve trunk ROM to L sidebending. Balance on L propped elbow with close SPV and VC for use of head/shoulders to control balance position.       Performed Date:  04/02/2016     Carney Living, AMBER R - 04/04/2016 11:45 EDT   PT Therapeutic Activities Grid     Activity 1  Activity 2  Activity 3      Activity :    Dynamic sitting balance, Static sitting balance              Response :    Improved stability in muscle  group(s), Improved timing, control, and coordination, Increased activation of targeted muscle group(s)              Comment :    static sitting balance EOM with B UE support on mat progressing to B UE support on LE, then alt lifting UE to challenge sitting balance with unilat UE support. Pt able to progress to no UE support able able to sustain 20-40 sec each attempt. Dynamic (cont   (cont) sitting balance throwing/catching ball with B UE close SPV, occ LOB post and R requiring mod A to correct. Dynamic balance moving ant/post at target and lateral to targets to increase trunk control and ability to WS off BOS. Occ LOB to R (cont)   (cont) requiring mod A to correct. Multiple rest breaks needed throughout all balance activity 2* MS fatique.           BENTON, PT, AMBER R - 04/04/2016 11:45 EDT  BENTON, PT, AMBER R - 04/04/2016 11:45 EDT  BENTON, PT, AMBER R - 04/04/2016 11:45 EDT       Reassess Mobility :   Yes   BENTON, PT, AMBER R - 04/04/2016 11:45 EDT   PT Mobility   Mobility Grid   Roll Left :   Rehab Maximal assistance   Roll Right :   Rehab Total assistance   Roll Supine :   Rehab Total assistance   Supine to Sit :   Rehab Maximal assistance   Sit to Supine :   Rehab Total assistance   Scooting :   Rehab Total assistance   Transfer Bed to and From Chair :   Rehab Maximal assistance   Transfer Toilet :   Total assistance   BENTON, PT, AMBER R - 04/04/2016 11:45 EDT   Amb Ability Varied Surf/Distraction Grid   Level Surfaces :   Does not occur   Uneven Surfaces :   Does not occur   Distracting Environments :   Does not occur   Curbs :   Does not occur   Stairs :   Does not occur   Ramp :   Does not occur   BENTON, PT, AMBER R - 04/04/2016 11:45 EDT   Transfer Type :   Transfer board   PT Mobility Reviewed :   Yes   BENTON, PT, AMBER R - 04/04/2016 11:45 EDT   WC Management   Type of Wheelchair :   Power wheelchair   Wheelchair Details :   joystick moved to L for incr indep when fatiqued.    BENTON, PT, AMBER R -  04/04/2016 11:45 EDT   Wheelchair Mobility   Level Surfaces :   Distant supervision   BENTON, PT, AMBER R - 04/04/2016 11:45 EDT   Wheelchair Mobility Level Distance Daily :   200    BENTON,  PT, AMBER R - 04/04/2016 11:45 EDT   Pressure Relief     Wheelchair Pressure Relief Trial 1          Technique :    Tilt in space, power              Assist :    Setup                BENTON, PT, AMBER R - 04/04/2016 11:45 EDT         Short Term Goals   Bed Mobility Goal Grid     Goal #1          Descriptors :    Roll to right and left              Level :    Moderate assistance              Status :    Progressing, continue                BENTON, PT, AMBER R - 04/04/2016 11:45 EDT         Transfers Goal Grid     Goal #1          Descriptors :    Transfer board              Level :    Moderate assistance              Status :    Progressing, continue                BENTON, PT, AMBER R - 04/04/2016 11:45 EDT         W/C Management Grid     Goal #1  Goal #2        Descriptors :    Power wheelchair mobility indoors   Pressure relief tilt in space (power wheelchair)           Level :    Minimal assistance   Minimal assistance           Status :    Progressing, continue   Progressing, continue             BENTON, PT, AMBER R - 04/04/2016 11:45 EDT  BENTON, PT, AMBER R - 04/04/2016 11:45 EDT        PT Balance Goal Grid     Goal #1          Descriptor :    Unsupported short sit              Assist Level :    Moderate assistance              Type :    Static sitting              Length of Time (minutes) :    2 minutes              Rationale :    Improve independence with activities of daily living              Status :    Progressing, continue                BENTON, PT, AMBER R - 04/04/2016 11:45 EDT         PT ST Goals Reviewed :   Silvio Clayman, PT, AMBER R - 04/04/2016 11:45 EDT   Assessment   PT Impairments or Limitations :  Abnormal tone, Balance deficits, Bed mobility deficits, Coordination/Proprioception deficits, Endurance deficits,  Equipment training, Impaired sensation, Range of motion deficits, Strength deficits, Transfer deficits, Transition deficits, Wheelchair mobility deficits   Barriers to Safe Discharge PT :   Medical diagnosis   Discharge Recommendations :   ELOS: 3 weeks; home with assist and PWC     PT Treatment Recommendations :   Pt demonstrates improved sitting balance today and ability to operate power wheelchair with L joystick. She continues to be limited by quadriparesis and decresaed endurance/fatique. She will cont to benefit from skilled PT to address above impairments and progress indep with functional mobility and transfers prior to DC home.      BENTON, PT, AMBER R - 04/04/2016 11:45 EDT   Time Spent With Patient   PT Time In :   8:30 EST   PT Time Out :   10:00 EST   PT ADL TRAINING 15 MN :   3 units   PT ADL Training Time :   45 minutes   PT NEUROMUSC RE-EDUC 15 MIN :   2 units   PT Neuromuscular Reeducation Time :   30 minutes   PT Wheelchair Management Units :   1 units   PT Wheelchair Management Time :   15 minutes   PT Total Individual Therapy Time :   90 minutes   PT Total Timed Code Treatment Units :   6 units   PT Total Timed Code Tx Minutes :   90 minutes   PT Total Treatment Time Rehab :   90 minutes   BENTON, PT, AMBER R - 04/04/2016 11:45 EDT

## 2016-04-04 NOTE — Progress Notes (Signed)
OT Inpatient Daily Documentation - Text       OT Inpatient Daily Documentation Entered On:  04/04/2016 15:09 EDT    Performed On:  04/04/2016 12:30 EDT by WATERS, OT, MANDI R               Reason for Treatment   *Reason for Referral :   Admitted for MS exacerbation    82 yo female with past medical history of progressive MS diagnosed in 1989, CVA with right sided weakness, hypothyroidism and neurogenic bladder admitted to [facility] in [place] on 03/06/16 with recurrent generalized weakness of the upper and lower extremities and poor posture. She has been wheelchair bound for the past few years due to right-sided weakness.    (per pt, she did not have a CVA)     *Chief Complaint :   weakness     WATERS, OT, MANDI R - 04/04/2016 15:02 EDT   Review/Treatments Provided   OT Goals :   OT Short Term Goals    03/31/2016  Eating Goal #1: Manage food containers/packages; Distant S; Initial  Grooming Goal #1: Grooming routine; Supported short sit, Unsupported short sit, Wheelchair; CGA; Initial  Upper Body Dressing Goal #1: Upper extremity dressing; Mod A; Initial  Lower Body Dressing Goal #1: Lower extremity dressing; Supine, Supported short sit, Unsupported short sit; Max A; Initial  Bathing Goal #1: Bathe; Supported short sit, Unsupported short sit, Wheelchair; Max A; Initial  Toileting and Transfers Goal #1: Toilet transfers; Max A; Bedside commode; Drop arm commode, Elevated toilet seat, Grab bars, Sliding board; Initial  Toileting and Transfers Goal #2: Toileting; Max A; Drop arm commode, Elevated toilet seat, Grab bars, Sliding board; Initial  Balance Goal #1: Unsupported short sit; Static sitting; CGA; 5; Improve independence with activities of daily living; Initial     OT Plan :   Treatment Frequency: Daily Performed By: Carilyn GoodpastureHOBBY, OT, LESLIE M   03/30/2016  Treatment Duration: 3 Performed By: Katherine RoanHOBBY, OT, LESLIE M   03/30/2016  Planned Treatments: Balance training, Basic Activities of Daily Living, Caregiver  training, Coordination, Energy conservation training, Equipment training, Group therapy, Mobility training, Patient education, Safety education, Therapeutic activities, Therapeutic exercises... Performed By: Katherine RoanHOBBY, OT, LESLIE M   03/30/2016     Occupational Therapy Orders :   Occupational Therapy Inpatient Additional Treatment Rehab - 03/30/16 16:40:41 EDT, Balance training, Basic Activities of Daily Living, Caregiver training, Coordination, Energy conservation training, Equipment training, Group therapy, Mobility training, Patient education, Safety education, Therapeutic activitie...  OT FIMS - 03/29/16 19:38:00 EDT, Daily     Pain Present :   No actual or suspected pain   OT Therapeutic Activity,Mobility,Balance :   Yes   OT Therapeutic Exercise :   Yes   WATERS, OT, MANDI R - 04/04/2016 15:02 EDT   Therapeutic Activities   OT Therapeutic Activities RTF :   Therapeutic Activities  Activity 1:  Pt total  A to completed LB dressing and clean up after having accident. Pt req total A for rolling to B sides. Hoyer <> w/c <>bed.       Performed Date:  04/02/2016     WATERS, OT, [place] - 04/04/2016 15:02 EDT   OT Therapeutic Activities Grid     Activity 1          Comments :    Pt transferred  w/c>bed with hoyer, doffed LB clothing, bed>BSC with hoyer, cleaned up after toileting, BSC>bed with hoyer, and donned brief with total A.  WATERS, OT, MANDI R - 04/04/2016 15:02 EDT         Therapeutic Exercise   Therapeutic Exercise RTF :   Therapeutic Exercise    No qualifying data available     WATERS, OT, MANDI R - 04/04/2016 15:02 EDT   OT Therapeutic Exercise Grid     Exercise 1  Exercise 2        Comments :    Pt completed arm skate with R UE in all planes with 4# wt x 20 reps and min A. Pt pushed bean bags up incline with B UE's, mod A for R min A for L to increase UE and trunk strength. Pt took out inset pegs with R UE and mod A for UE support and placed in    with L hand and increased time due to fatigue and  decreased strength. Placed key shaped pegs in holes with increased difficulty and L hand.              WATERS, OT, MANDI R - 04/04/2016 15:02 EDT  WATERS, OT, MANDI R - 04/04/2016 15:02 EDT        Assessment   OT Impairments or Limitations :   Balance deficits, Basic activity of daily living deficits, Coordination deficits, Endurance deficits, Equipment training, IADL deficits, Mobility deficits, Proprioception deficits, Safety awareness deficits, Strength deficits   OT Discharge Recommendations :   ELOS 3 weeks then home with spouse and power w/c     OT Treatment Recommendations :   Pt continues to req skilled OT services to increase strength, transfers, mobility, ADL's, and functional reach.      WATERS, OT, MANDI R - 04/04/2016 15:02 EDT   Time Spent With Patient   OT Time In :   12:30 EST   OT Time Out :   14:30 EST   OT ADL TRAINING 15 MIN :   4    OT ADL Training Minutes :   60 minutes   OT Therapeutic Exercise Units :   4 units   OT Therapeutic Exercise Time :   60 minutes   OT Total Individual Therapy Time :   120 minutes   OT Total Timed Code Treatment Units :   8 units   OT Total Timed Code Treatment Minutes :   120 minutes   OT Total Treatment Time Rehab :   120 minutes   WATERS, OT, MANDI R - 04/04/2016 15:02 EDT

## 2016-04-04 NOTE — Progress Notes (Signed)
Functional Indep Measure Scores - Text       Functional Independence Measure Scores Entered On:  04/04/2016 13:16 EDT    Performed On:  04/04/2016 13:14 EDT by Maryann AlarLidstone, RN, Verlon AuLeslie               Eating Score   Patient's Independence Level With Eating Tasks :   Independent/Modified independence   Independent or Modifications Needed :   Complete independence   Functional Independence Measure Eating :   Complete independence   Riki AltesLidstone, RN, Leslie - 04/04/2016 13:14 EDT   Toileting Score   Patient's Independence Level with Toileting Tasks :   Assistance   Type of Assistance Necessary :   Requires assistance of 2 people   Toileting :   Total assistance   Riki AltesLidstone, RN, Leslie - 04/04/2016 13:14 EDT   Bladder Management Score   Patient's Independence Level with Bladder Management Tasks :   Assistance   Type of Assistance Necessary :   Helper positions and holds bedpan, assists patient to roll on OR off the bedpan, Helper performs intermittent catheterization   Functional Independence Measure Bladder Management :   Total assistance   Riki AltesLidstone, RN, Leslie - 04/04/2016 13:14 EDT   Bowel Management Score   Patient's Independence Level with Bowel Management Tasks :   Assistance   Type of Assistance Necessary :   Helper positions and holds bedpan, assists patient to roll on OR off the bedpan, Helper changes linen or clothing/cleans up spill, Helper changes diaper   Functional Independence Measure Bowel Management :   Total assistance   Riki AltesLidstone, RN, Leslie - 04/04/2016 13:14 EDT

## 2016-04-05 NOTE — Progress Notes (Signed)
PT Inpatient Daily Documentation - Text       PT Inpatient Daily Documentation Entered On:  04/05/2016 12:19 EDT    Performed On:  04/05/2016 10:30 EDT by Eliseo Gum, PT, AMBER R               Reason for Treatment   *Reason for Referral :   Progressive MS (dx 1989)  new weakness following UTI and C-diff    3 hrs/day     *Chief Complaint :   weakness bilat LE's, tightness R ankle/calf     BENTON, PT, AMBER R - 04/05/2016 12:04 EDT   Review/Treatments Provided   PT Therapeutic Exercise :   Yes   BENTON, PT, AMBER R - 04/05/2016 14:04 EDT   PT Goals :   PT Short Term Goals    04/04/2016  Bed Mobility Goal #1: Roll to right and left; Mod A; Progressing, continue  Transfer Goal #1: Transfer board; Mod A; Progressing, continue  Wheelchair Management Goal #1: Power wheelchair mobility indoors; Minimal assistance; Progressing, continue  Wheelchair Management Goal #2: Pressure relief tilt in space (power wheelchair); Minimal assistance; Progressing, continue  Balance Goal #1: Unsupported short sit; Mod A; Static sitting; 2; Improve independence with activities of daily living; Progressing, continue     PT Plan :   Treatment Frequency:  Daily (modified)   Performed By: Eliseo Gum, PT, AMBER R  03/30/2016 09:30  Treatment Duration: 3 Performed By: Carney Living, AMBER R  03/30/2016 09:30  Planned Treatments: Balance training, Basic activities of daily living, Bed mobility training, Caregiver training, Electrical stimulation, Equipment training, Functional training, Manual therapy, Neuromuscular reeducation, Pain management, Posture/Body mechanics training,... Performed By: Carney Living, AMBER R  03/30/2016 09:30     Short Term Goals Reviewed :   Yes   Physical Therapy Orders :   Physical Therapy Inpatient Additional Treatment Rehab - 03/30/16 11:39:51 EDT, Balance training, Basic activities of daily living, Bed mobility training, Caregiver training, Electrical stimulation, Equipment training, Functional training, Manual therapy, Neuromuscular  reeducation, Pain management, Posture/...  PT FIMS - 03/29/16 19:38:00 EDT, Daily     Pain Present :   No actual or suspected pain   PT Therapeutic Activity,Mobility,Balance :   Yes   BENTON, PT, AMBER R - 04/05/2016 12:04 EDT   Therapeutic Activities/Mobility/Balance   Functional Activity :   Therapeutic Activities  Activity 1:  Dynamic sitting balance, Static sitting balance; Improved stability in muscle group(s), Improved timing, control, and coordination, Increased activation of targeted muscle group(s); static sitting balance EOM with B UE support on mat progressing to B UE support on LE, then alt lifting UE to challenge sitting balance with unilat UE support. Pt able to progress to no UE support able able to sustain 20-40 sec each attempt. Dynamic (cont       Performed Date:  04/04/2016  Activity 2:  (cont) sitting balance throwing/catching ball with B UE close SPV, occ LOB post and R requiring mod A to correct. Dynamic balance moving ant/post at target and lateral to targets to increase trunk control and ability to WS off BOS. Occ LOB to R (cont)       Performed Date:  04/04/2016  Activity 3:  (cont) requiring mod A to correct. Multiple rest breaks needed throughout all balance activity 2* MS fatique.       Performed Date:  04/04/2016  Activity 4:  Transitional movement; lateral propped on L elbow to encourage appropriate trunk righting  and improve trunk ROM to L sidebending.  Balance on L propped elbow with close SPV and VC for use of head/shoulders to control balance position.       Performed Date:  04/02/2016     Carney LivingBENTON, PT, AMBER R - 04/05/2016 12:04 EDT   PT Therapeutic Activities Grid     Activity 1  Activity 2  Activity 3      Activity :    Dynamic sitting balance      Transitional movement        Assist :    Setup, Minimal assistance      Setup, Minimal assistance        Response :    Demonstrated positive response to treatment, Improved stability in muscle group(s), Improved timing, control, and  coordination, Increased activation of targeted muscle group(s)      Demonstrated positive response to treatment, Improved timing, control, and coordination, Increased activation of targeted muscle group(s)        Comment :    static sitting balance with no UE support x 1 min with no LOB; progressed to dynamic sitting balance moving ant/post and lateral to target for improved trunk control and balance with functional mobility. Dynamic sitting blance throwing/catching (cont)   (cont) ball in midline then off midline; occ LOB posterior and to the right requiring assist to correct. Multiple rest breaks throughout balance activity 2* decr endurance.   lateral propped on L elbow and return to midline sitting with SPV, occ min A 3 x 5 reps for improved indep with placement of SB for transfers. VC to maintain ant WS and hand placement.          Eliseo GumBENTON, PT, AMBER R - 04/05/2016 12:04 EDT  Eliseo GumBENTON, PT, AMBER R - 04/05/2016 12:04 EDT  BENTON, PT, AMBER R - 04/05/2016 12:04 EDT       Reassess Mobility :   Yes   BENTON, PT, AMBER R - 04/05/2016 12:04 EDT   PT Mobility   Mobility Grid   Transfer Bed to and From Chair :   Rehab Maximal assistance   BENTON, PT, AMBER R - 04/05/2016 12:04 EDT   Amb Ability Varied Surf/Distraction Grid   Level Surfaces :   Does not occur   Uneven Surfaces :   Does not occur   Distracting Environments :   Does not occur   Curbs :   Does not occur   Stairs :   Does not occur   Ramp :   Does not occur   BENTON, PT, AMBER R - 04/05/2016 12:04 EDT   Transfer Type :   Transfer board   PT Mobility Reviewed :   Yes   BENTON, PT, AMBER R - 04/05/2016 12:04 EDT   Therapeutic Exercise   Therapeutic Exercise RTF :   Therapeutic Exercise    No qualifying data available     BENTON, PT, AMBER R - 04/05/2016 14:04 EDT   Therapeutic Exercise Grid     Exercise 1  Exercise 2        Exercise :    Scapular strengthening   Upper extremity stretching           Repetition/Time :    3 x 10              Comment :    scap retraction,  isometric deep cervical flexors with mirror for improved visual feedback to increase ROM    lateral sidebending to R with overpressure to increase ROM to midline  Eliseo Gum, PT, AMBER R - 04/05/2016 14:04 EDT  BENTON, PT, AMBER R - 04/05/2016 14:04 EDT        Short Term Goals   Bed Mobility Goal Grid     Goal #1          Descriptors :    Roll to right and left              Level :    Moderate assistance              Status :    Progressing, continue                BENTON, PT, AMBER R - 04/05/2016 12:04 EDT         Transfers Goal Grid     Goal #1          Descriptors :    Transfer board              Level :    Moderate assistance              Status :    Progressing, continue                BENTON, PT, AMBER R - 04/05/2016 12:04 EDT         W/C Management Grid     Goal #1  Goal #2        Descriptors :    Power wheelchair mobility indoors   Pressure relief tilt in space (power wheelchair)           Level :    Minimal assistance   Minimal assistance           Status :    Progressing, continue   Progressing, continue             BENTON, PT, AMBER R - 04/05/2016 12:04 EDT  BENTON, PT, AMBER R - 04/05/2016 12:04 EDT        PT Balance Goal Grid     Goal #1          Descriptor :    Unsupported short sit              Assist Level :    Moderate assistance              Type :    Static sitting              Length of Time (minutes) :    2 minutes              Rationale :    Improve independence with activities of daily living              Status :    Progressing, continue                BENTON, PT, AMBER R - 04/05/2016 12:04 EDT         PT ST Goals Reviewed :   Yes   BENTON, PT, AMBER R - 04/05/2016 12:04 EDT   Assessment   PT Impairments or Limitations :   Abnormal tone, Balance deficits, Bed mobility deficits, Coordination/Proprioception deficits, Endurance deficits, Equipment training, Impaired sensation, Range of motion deficits, Strength deficits, Transfer deficits, Transition deficits, Wheelchair mobility deficits    Barriers to Safe Discharge PT :   Medical diagnosis   Discharge Recommendations :   ELOS: 3 weeks; home with assist and PWC     PT Treatment Recommendations :  Pt is progressing towards all set PT goals with improved sitting balance today. She will cont to benefit from skilled PT to address above impairments and progress indep with functional mobility and transfers prior to DC.      BENTON, PT, AMBER R - 04/05/2016 12:04 EDT   Time Spent With Patient   PT Time In :   10:30 EST   PT Time Out :   11:30 EST   PT Time In 2 :   13:30 EST   PT Time Out 2 :   14:00 EST   PT Therapeutic Exercise Units :   1 units   PT Therapeutic Exercise Time :   15 minutes   PT ADL TRAINING 15 MN :   2 units   PT ADL Training Time :   15 minutes   PT Concur ADL Training Time :   15 minutes   PT NEUROMUSC RE-EDUC 15 MIN :   3 units   PT Concur Neuromuscular Reeducation Time :   45 minutes   PT Total Individual Therapy Time :   30 minutes   PT Total Concurrent Therapy Time :   60 minutes   PT Total Timed Code Treatment Units :   6 units   PT Total Timed Code Tx Minutes :   90 minutes   PT Total Treatment Time Rehab :   90 minutes   BENTON, PT, AMBER R - 04/05/2016 12:04 EDT

## 2016-04-05 NOTE — Progress Notes (Signed)
Functional Indep Measure Scores - Text       Functional Independence Measure Scores Entered On:  04/05/2016 2:09 EDT    Performed On:  04/05/2016 2:07 EDT by Milus BanisterOUSE, LPN, Shaune PollackJOANN F               Toileting Score   Patient's Independence Level with Toileting Tasks :   Does not occur   Toileting :   Does not occur   Toileting Score Comments :   onnights   ROUSE, LPN, Shaune PollackJOANN F - 1/61/09609/29/2017 2:07 EDT   Bladder Management Score   Patient's Independence Level with Bladder Management Tasks :   Assistance   Type of Assistance Necessary :   Helper positions and holds bedpan, assists patient to roll on AND off the bedpan, Helper changes linen or clothing/cleans up spill, Helper changes diaper or absorbent pad   Functional Independence Measure Bladder Management :   Total assistance   ROUSE, LPN, Shaune PollackJOANN F - 4/54/09819/29/2017 2:07 EDT   Bowel Management Score   Patient's Independence Level with Bowel Management Tasks :   Assistance   Type of Assistance Necessary :   Helper positions and holds bedpan, assists patient to roll on AND off the bedpan, Helper changes linen or clothing/cleans up spill, Helper changes diaper   Functional Independence Measure Bowel Management :   Total assistance   ROUSE, LPN, Shaune PollackJOANN F - 1/91/47829/29/2017 2:07 EDT

## 2016-04-05 NOTE — Progress Notes (Signed)
Functional Indep Measure Scores - Text       Functional Independence Measure Scores Entered On:  04/05/2016 10:25 EDT    Performed On:  04/05/2016 7:30 EDT by WATERS, OT, MANDI R               Eating Score   Patient's Independence Level With Eating Tasks :   Setup/Supervision   Supervision or Setup Needed :   Open containers, Pour liquid into containers   Functional Independence Measure Eating :   Supervision or setup   WATERS, OT, MANDI R - 04/05/2016 10:22 EDT   Grooming Score   Patient's Independence Level with Grooming Tasks :   Setup/Supervision   Supervision or Setup Needed :   Setup equipment   Grooming :   Supervision or setup   WATERS, OT, MANDI R - 04/05/2016 10:22 EDT   Bathing Score   Patient's Independence Level with Bathing Tasks :   Assistance   Type of Assistance Necessary :   Assistance with bathing body parts   Body Parts Assessed :   All body surfaces   Tasks Requiring Assistance :   Buttocks, Lower leg and foot, left, Lower leg and foot, right   Functional Independence Measure Bathing :   Moderate assistance   WATERS, OT, MANDI R - 04/05/2016 10:22 EDT   Upper Body Dressing Score   Patient's Independence Level with Upper Body Dressing Tasks :   Assistance   Type of Assistance Necessary :   Assistance with dressing tasks   Tasks Assessed :   Thread/Unthread right sleeve, Thread/Unthread left sleeve, Pull/Remove head through neckline, Pull/Remove over trunk   Tasks Requiring Assistance :   Pull/Remove over trunk   UE Dressing :   Minimal contact assistance   WATERS, OT, MANDI R - 04/05/2016 10:22 EDT   Lower Body Dressing Score   Patient's independence Level with Lower Body Dressing Tasks :   Assistance   Type of Assistance Necessary :   Assistance with dressing tasks   Tasks Assessed :   All dressing tasks   Tasks Requiring Assistance :   All dressing tasks   LE Dressing :   Total assistance   WATERS, OT, MANDI R - 04/05/2016 10:22 EDT   Toileting Score   Patient's Independence Level with Toileting Tasks  :   Assistance   Type of Assistance Necessary :   Assistance with toileting tasks   Amount of Assistance Needed :   Adjusting clothing before toileting, Adjusting clothing after toileting, Cleansing perineal area   Toileting :   Total assistance   WATERS, OT, MANDI R - 04/05/2016 10:22 EDT   Transfer Bed/Chair/WC Score   Patient's independence Level with Bed, Chair, Wheelchair Tasks :   Assistance   Type of Assistance Necessary :   Mechanical lift required   Bed, Chair, Wheelchair Transfer :   Total assistance   WATERS, OT, MANDI R - 04/05/2016 10:22 EDT   Transfer Toilet Score   Patient's Independence Level with Transfer Toilet Tasks :   Assistance   Amount of Assistance Necessary :   Mechanical lift required   Transfer Toilet :   Total assistance   WATERS, OT, MANDI R - 04/05/2016 10:22 EDT   Transfer Shower Score   Patient's independence Level with Transfer Shower Tasks :   Assistance   Type of Assistance Necessary :   Mechanical lift required   Shower Transfer :   Total assistance   WATERS, OT, MANDI R -  04/05/2016 10:22 EDT   Comprehension Score   Mode of Comprehension :   Auditory   Comprehends Complex or Abstract Information Without Prompting or Cueing :   Yes   Understands Complex or Abstract Directions and Conversations :   At all times   Comprehension Indep Measure Interim :   Complete independence   WATERS, OT, MANDI R - 04/05/2016 10:22 EDT   Expression Score   Expression Mode :   Vocal   Expresses Complex or Abstract Information Without Prompting or Cueing :   Yes   Expresses Complex or Abstract Ideas :   Clearly and fluently at all times   Expression Indep Measure Interim :   Complete independence   WATERS, OT, MANDI R - 04/05/2016 10:22 EDT   Social Interaction Score   Interacts Appropriately Without Supervision :   Yes   Interacts Appropriately :   At all times   Social Interaction Indep Measure Interim :   Complete independence   WATERS, OT, MANDI R - 04/05/2016 10:22 EDT   Problem Solving Score   Solves  Complex Problems :   Yes   Ability to Solve Complex Problems :   Consistently solves problems independently   Problem Solving Indep Measure Interim :   Complete independence   WATERS, OT, MANDI R - 04/05/2016 10:22 EDT   Memory Score   Recognizes, Remembers Routines, and Executes Requests Without Prompting :   Yes   Remembers and Executes Requests :   Consistently without need for repetition   Memory Indep Measure Interim :   Complete independence   WATERS, OT, MANDI R - 04/05/2016 10:22 EDT

## 2016-04-05 NOTE — Progress Notes (Signed)
Functional Indep Measure Scores - Text       Functional Independence Measure Scores Entered On:  04/05/2016 15:26 EDT    Performed On:  04/05/2016 15:24 EDT by Kyung RuddKENNEDY, RN, MANUVETTE E               Eating Score   Patient's Independence Level With Eating Tasks :   Setup/Supervision   Supervision or Setup Needed :   Gather equipment, Open containers, Pour liquid into containers   Functional Independence Measure Eating :   Supervision or setup   [place]havenKENNEDY, RN, MANUVETTE E - 04/05/2016 15:24 EDT   Bladder Management Score   Patient's Independence Level with Bladder Management Tasks :   Assistance   Type of Assistance Necessary :   Helper changes linen or clothing/cleans up spill, Helper changes diaper or absorbent pad   Functional Independence Measure Bladder Management :   Total assistance   KENNEDY, RN, MANUVETTE E - 04/05/2016 15:24 EDT   Bowel Management Score   Patient's Independence Level with Bowel Management Tasks :   Assistance   Type of Assistance Necessary :   Helper positions and holds bedpan, assists patient to roll on AND off the bedpan, Helper changes linen or clothing/cleans up spill   Functional Independence Measure Bowel Management :   Total assistance   Kyung RuddKENNEDY, RN, MANUVETTE E - 04/05/2016 15:24 EDT   Transfer Toilet Score   Patient's Independence Level with Transfer Toilet Tasks :   Assistance   Amount of Assistance Necessary :   Two helpers needed, Mechanical lift required, Total assist (patient performs less than 25%)   Transfer Toilet :   Total assistance   Kyung RuddKENNEDY, RN, MANUVETTE E - 04/05/2016 15:24 EDT

## 2016-04-05 NOTE — Progress Notes (Signed)
OT Inpatient Daily Documentation - Text       OT Inpatient Daily Documentation Entered On:  04/05/2016 10:33 EDT    Performed On:  04/05/2016 7:30 EDT by WATERS, OT, MANDI R               Reason for Treatment   *Reason for Referral :   Admitted for MS exacerbation    82 yo female with past medical history of progressive MS diagnosed in 1989, CVA with right sided weakness, hypothyroidism and neurogenic bladder admitted to [facility] in [place], [place] on 03/06/16 with recurrent generalized weakness of the upper and lower extremities and poor posture. She has been wheelchair bound for the past few years due to right-sided weakness.    (per pt, she did not have a CVA)     *Chief Complaint :   weakness     WATERS, OT, MANDI R - 04/05/2016 10:25 EDT   Review/Treatments Provided   OT Goals :   OT Short Term Goals    03/31/2016  Eating Goal #1: Manage food containers/packages; Distant S; Initial  Grooming Goal #1: Grooming routine; Supported short sit, Unsupported short sit, Wheelchair; CGA; Initial  Upper Body Dressing Goal #1: Upper extremity dressing; Mod A; Initial  Lower Body Dressing Goal #1: Lower extremity dressing; Supine, Supported short sit, Unsupported short sit; Max A; Initial  Bathing Goal #1: Bathe; Supported short sit, Unsupported short sit, Wheelchair; Max A; Initial  Toileting and Transfers Goal #1: Toilet transfers; Max A; Bedside commode; Drop arm commode, Elevated toilet seat, Grab bars, Sliding board; Initial  Toileting and Transfers Goal #2: Toileting; Max A; Drop arm commode, Elevated toilet seat, Grab bars, Sliding board; Initial  Balance Goal #1: Unsupported short sit; Static sitting; CGA; 5; Improve independence with activities of daily living; Initial     OT Plan :   Treatment Frequency: Daily Performed By: Carilyn Goodpasture M   03/30/2016  Treatment Duration: 3 Performed By: Katherine Roan   03/30/2016  Planned Treatments: Balance training, Basic Activities of Daily Living, Caregiver  training, Coordination, Energy conservation training, Equipment training, Group therapy, Mobility training, Patient education, Safety education, Therapeutic activities, Therapeutic exercises... Performed By: Katherine Roan   03/30/2016     Occupational Therapy Orders :   Occupational Therapy Inpatient Additional Treatment Rehab - 03/30/16 16:40:41 EDT, Balance training, Basic Activities of Daily Living, Caregiver training, Coordination, Energy conservation training, Equipment training, Group therapy, Mobility training, Patient education, Safety education, Therapeutic activitie...  OT FIMS - 03/29/16 19:38:00 EDT, Daily     Pain Present :   No actual or suspected pain   OT Therapeutic Activity,Mobility,Balance :   Yes   WATERS, OT, MANDI R - 04/05/2016 10:25 EDT   Therapeutic Activities   OT Therapeutic Activities RTF :   Therapeutic Activities  Activity 1:  Pt transferred  w/c>bed with hoyer, doffed LB clothing, bed>BSC with hoyer, cleaned up after toileting, BSC>bed with hoyer, and donned brief with total A.       Performed Date:  04/04/2016     Reassess Activities of Daily Living :   Yes   WATERS, OT, MANDI R - 04/05/2016 10:25 EDT   OT Basic ADL   Basic ADL Grid   Eating :   Supervision or setup   Grooming :   Supervision or setup   Bathing :   Moderate assistance   UE Dressing :   Minimal contact assistance   LE Dressing :  Total assistance   Toileting :   Total assistance   Transfer Toilet :   Total assistance   Tub Transfer :   Does not occur   Shower Transfer :   Total assistance   WATERS, OT, MANDI R - 04/05/2016 10:25 EDT   ADL Comments :   04/05/16: Pt completed bed mobility and all transfers with total A. Pt req hoyer to completed bed<>shower seat/BSC, shower chair>bed, bed>w/c transfers. Pt completed bathing with mod A for B ft and bottom. Pt completed UB dressing with min A, total A for LB dressing. Pt req setup for grooming and eating.        OT ADL Reviewed :   Yes   WATERS, OT, MANDI R - 04/05/2016  10:25 EDT   Assessment   OT Impairments or Limitations :   Balance deficits, Basic activity of daily living deficits, Coordination deficits, Endurance deficits, Equipment training, IADL deficits, Mobility deficits, Proprioception deficits, Safety awareness deficits, Strength deficits   OT Discharge Recommendations :   ELOS 3 weeks then home with spouse and power w/c     OT Treatment Recommendations :   Pt continues to req skilled OT services to increase strength, mobility, ADL's, AROM, and functional reach.      WATERS, OT, MANDI R - 04/05/2016 10:25 EDT   Time Spent With Patient   OT Time In :   7:30 EST   OT Time Out :   9:00 EST   OT ADL TRAINING 15 MIN :   6    OT ADL Training Minutes :   90 minutes   OT Total Individual Therapy Time :   90 minutes   OT Total Timed Code Treatment Units :   6 units   OT Total Timed Code Treatment Minutes :   90 minutes   OT Total Treatment Time Rehab :   90 minutes   WATERS, OT, MANDI R - 04/05/2016 10:25 EDT

## 2016-04-05 NOTE — Case Communication (Signed)
Face to Face Encounter Document - Text       Face to Face Encounter Documentation Entered On:  04/05/2016 5:19 EDT    Performed On:  04/05/2016 5:17 EDT by Carolyne FiscalWARMOTH-MD,   E               Face to Face Encounter   [facility] :   I had a face to face encounter with this patient that meets the provider face to face requirements, As the ordering provider, I attest to the evaluation, assessment, treatment and follow-up in relation to both the patient and to the home health services ordered.   Primary Reason for Home Health Care :   Functional limitations due to acute medical decompensation   Medically Necessary Patient Services: :   Skilled Nursing: To Evaluate, Treat, and Manage Care Related to Diagnosis, Physical Therapy: To Evaluate, Treat and Manage Care of Related Diagnosis, Occupational Therapy: To Evaluate, Treat and Manage Care Related to Diagnosis   Clinical Findings Support the need for Skill :   Decreased or limited mobility, range of motion, and/or functional status requiring therapy, Gait instability or difficulty walking requiring gait training and strengthening excercises, New or changed medications and/or history of medication concerns requiring monitoring/teaching, Potential for complications or exacerbation of condition requiring assessment of condition   Additional Support for Homebound Status :   At risk for falls due to history of falls, limited/altered mobility, or medical condition, Pain impairs mobility; pain medication can impair decision making, Requires assistance with most or all ADLs/IADLs, Severely weakened condition due to hospitalization, nutrition/hydration, and/or deterioration, Activity restrictions due to medical condition/surgery   Encounter Date :   04/05/2016 5:17 EDT   Carolyne FiscalWARMOTH-MD,   E - 04/05/2016 5:17 EDT

## 2016-04-06 NOTE — Progress Notes (Signed)
PT Inpatient Daily Documentation - Text       PT Inpatient Daily Documentation Entered On:  04/06/2016 17:54 EDT    Performed On:  04/06/2016 12:30 EDT by Daiva Nakayama, PT, Christine [name]               Reason for Treatment   *Reason for Referral :   Progressive MS (dx 1989)  new weakness following UTI and C-diff    3 hrs/day     *Chief Complaint :   weakness bilat LE's, tightness R ankle/calf     WHITESIDES, PT, Christine [name] - 04/06/2016 17:15 EDT   Review/Treatments Provided   PT Goals :   PT Short Term Goals    04/05/2016  Bed Mobility Goal #1: Roll to right and left; Mod A; Progressing, continue  Transfer Goal #1: Transfer board; Mod A; Progressing, continue  Wheelchair Management Goal #1: Power wheelchair mobility indoors; Minimal assistance; Progressing, continue  Wheelchair Management Goal #2: Pressure relief tilt in space (power wheelchair); Minimal assistance; Progressing, continue  Balance Goal #1: Unsupported short sit; Mod A; Static sitting; 2; Improve independence with activities of daily living; Progressing, continue     PT Plan :   Treatment Frequency:  Daily (modified)   Performed By: Eliseo Gum, PT, AMBER R  03/30/2016 09:30  Treatment Duration: 3 Performed By: Carney Living, AMBER R  03/30/2016 09:30  Planned Treatments: Balance training, Basic activities of daily living, Bed mobility training, Caregiver training, Electrical stimulation, Equipment training, Functional training, Manual therapy, Neuromuscular reeducation, Pain management, Posture/Body mechanics training,... Performed By: Carney Living, AMBER R  03/30/2016 09:30     Short Term Goals Reviewed :   Yes   Physical Therapy Orders :   Physical Therapy Inpatient Additional Treatment Rehab - 03/30/16 11:39:51 EDT, Balance training, Basic activities of daily living, Bed mobility training, Caregiver training, Electrical stimulation, Equipment training, Functional training, Manual therapy, Neuromuscular reeducation, Pain management, Posture/...  PT FIMS - 03/29/16  19:38:00 EDT, Daily     Pain Present :   No actual or suspected pain   PT Therapeutic Activity,Mobility,Balance :   Yes   PT Wheelchair Management :   Yes   WHITESIDES, PT, Ubaldo Glassing - 04/06/2016 17:15 EDT   Therapeutic Activities/Mobility/Balance   Functional Activity :   Therapeutic Activities  Activity 1:  Dynamic sitting balance; Setup, Minimal assistance; Demonstrated positive response to treatment, Improved stability in muscle group(s), Improved timing, control, and coordination, Increased activation of targeted muscle group(s); static sitting balance with no UE support x 1 min with no LOB; progressed to dynamic sitting balance moving ant/post and lateral to target for improved trunk control and balance with functional mobility. Dynamic sitting blance throwing/catching (cont)       Performed Date:  04/05/2016  Activity 2:  (cont) ball in midline then off midline; occ LOB posterior and to the right requiring assist to correct. Multiple rest breaks throughout balance activity 2* decr endurance.       Performed Date:  04/05/2016  Activity 3:  Transitional movement; Setup, Minimal assistance; Demonstrated positive response to treatment, Improved timing, control, and coordination, Increased activation of targeted muscle group(s); lateral propped on L elbow and return to midline sitting with SPV, occ min A 3 x 5 reps for improved indep with placement of SB for transfers. VC to maintain ant WS and hand placement.       Performed Date:  04/05/2016  Activity 4:  Transitional movement; lateral propped on L elbow to encourage appropriate trunk righting  and improve trunk ROM to L sidebending. Balance on L propped elbow with close SPV and VC for use of head/shoulders to control balance position.       Performed Date:  04/02/2016     Ebony Hail - 04/06/2016 17:15 EDT   PT Therapeutic Activities Grid     Activity 1  Activity 2        Activity :    Transfer training, Other: (level surfaces, WC<>mat, lat scoot)    Other: Positioning in WC           Assist :    Total assistance   Total assistance           Equipment :    Sliding board, Other: power WC   Other: power WC           Response :    Other: Dep for setup of txfr board; max instruction from PT for setup/lat scoot tech using head/hips and momentum strategy; dep for repositioning BLEs t/o txfr for inc pt safety.              Comment :    Dep txfr power WC<>mat for repositioning pt cushion/wedges under R side of back of cushion for improved fxnal positioning/alignment/pressure mgmt in power WC; pt dep for txfr 2* tetraparesis and impd trunk control from MS flare up/deconditioning.   Assisted pt c repositioning WC cushion, wedges under WC cushion, BLEs, and pelvis in WC for improved fxnal positioning, postural support/alignment, pressure redistribution nec for protection of skin integrity; pt dep 2* MS related tetraparesis.             WHITESIDES, PT, Ubaldo Glassing - 04/06/2016 17:15 EDT  WHITESIDES, PT, Jill Alexanders [name] - 04/06/2016 17:15 EDT        Reassess Mobility :   Yes   Reassess Sitting Balance :   Yes   WHITESIDES, PT, Ubaldo Glassing - 04/06/2016 17:15 EDT   PT Mobility   Mobility Grid   Transfer Bed to and From Chair :   Rehab Total assistance   Transfer Toilet :   Total assistance   [place], PT, Ubaldo Glassing - 04/06/2016 17:15 EDT   Amb Ability Varied Surf/Distraction Grid   Level Surfaces :   Does not occur   Uneven Surfaces :   Does not occur   Distracting Environments :   Does not occur   Curbs :   Does not occur   Stairs :   Does not occur   Ramp :   Does not occur   [place], PT, Christine [name] - 04/06/2016 17:15 EDT   Transfer Type :   Transfer board   PT Mobility Reviewed :   Yes   WHITESIDES, PT, Christine [name] - 04/06/2016 17:15 EDT   Sitting Balance   Static Sitting Balance Assessment Grid   Sits Without UE Support :   Rehab Maximal assistance   Sits With One UE Support :   Rehab Maximal assistance   Sits With Two UE Support :   Rehab Maximal assistance   WHITESIDES, PT, Christine [name] -  04/06/2016 17:15 EDT   Sitting Surface Evaluated Upon :   Bed   WHITESIDES, PT, Christine [name] - 04/06/2016 17:15 EDT   Dynamic Sitting Balance Assessment Grid   Anterior Shift :   Unable   Posterior Shift :   Unable   Lateral to the Left Shift :   Unable   Lateral to the Right Shift :  Unable   WHITESIDES, PT, Ubaldo Glassing - 04/06/2016 17:15 EDT   Righting Reactions Grid   Left Protective Reactions :   Delayed   Right Protective Reactions :   Delayed   Left Righting Reactions :   Delayed   Right Righting Reactions :   Delayed   WHITESIDES, PT, Christine [name] - 04/06/2016 17:15 EDT   WC Management   Type of Wheelchair :   Manual wheelchair   Wheelchair Details :   Pt supervised while manuevering power WC c left mounted joystick for dist of 162ft; close SPV for safety 2* dec fine motor control and tetraparesis; no FIM as max dist capacity not assessed this date.   WHITESIDES, PT, Ubaldo Glassing - 04/06/2016 17:15 EDT   Wheelchair Mobility   Level Surfaces :   Close supervision   Daiva Nakayama, PT, Ubaldo Glassing - 04/06/2016 17:15 EDT   Wheelchair Mobility Level Distance Daily :   100    WHITESIDES, PT, Christine [name] - 04/06/2016 17:15 EDT   Pressure Relief     Wheelchair Pressure Relief Trial 1          Technique :    Tilt in space, power              Assist :    Maximal assistance              Duration (Seconds) :    120 seconds              Comments  (Comment: Education provided re: frequency/duration of completing power tilt pressure reliefs for protection of skin integrity: completed x2 during tx session c setup of positioning mode and toggling through positioning options/selecting full power tilt. [WHITESIDES, PT, Jill Alexanders [name] - 04/06/2016 17:15 EDT] )         Daiva Nakayama, PT, Jill Alexanders [name] - 04/06/2016 17:15 EDT         Short Term Goals   Bed Mobility Goal Grid     Goal #1          Descriptors :    Roll to right and left              Level :    Moderate assistance              Status :    Progressing, continue                WHITESIDES, PT, Christine [name] - 04/06/2016  17:15 EDT         Transfers Goal Grid     Goal #1          Descriptors :    Transfer board              Level :    Moderate assistance              Status :    Progressing, continue                WHITESIDES, PT, Christine [name] - 04/06/2016 17:15 EDT         [name]/C Management Grid     Goal #1  Goal #2  Goal #3  Goal #4    Descriptors :    Power wheelchair mobility indoors   Pressure relief tilt in space (power wheelchair)   Power wheelchair mobility indoors   Pressure relief tilt in space (power wheelchair)     Level :    Minimal assistance   Minimal assistance  Setup, Distant supervision   Modified independence     Status :    Goal met   Goal met   Initial   Initial     Date Met :    04/06/2016 EDT   04/06/2016 EDT             WHITESIDES, PT, Christine [name] - 04/06/2016 17:15 EDT  WHITESIDES, PT, Christine [name] - 04/06/2016 17:15 EDT  WHITESIDES, PT, Christine [name] - 04/06/2016 17:15 EDT  WHITESIDES, PT, Christine [name] - 04/06/2016 17:15 EDT      PT Balance Goal Grid     Goal #1          Descriptor :    Unsupported short sit              Assist Level :    Moderate assistance              Type :    Static sitting              Length of Time (minutes) :    2 minutes              Rationale :    Improve independence with activities of daily living              Status :    Progressing, continue                WHITESIDES, PT, Christine [name] - 04/06/2016 17:15 EDT         PT ST Goals Reviewed :   Birdena JubileeYes   WHITESIDES, PT, Ubaldo GlassingJUSTIN [name] - 04/06/2016 17:15 EDT   Education   Home Caregiver Name/Relationship :   Toy CookeyAlfred Sramek-spouse   Teaching Method :   Demonstration, Explanation   WHITESIDES, PT, Christine [name] - 04/06/2016 17:15 EDT   WHITESIDES, PT, Ubaldo GlassingJUSTIN [name] - 04/06/2016 17:15 EDT   Physical Therapy Education Grid   Bed to Chair Transfers :   Needs further teaching, Needs practice/supervision   [place], PT, Ubaldo GlassingJUSTIN [name] - 04/06/2016 17:15 EDT   Exercise Program :   [employer]ristol[employer] SquibbVerbalizes understanding, Demonstrates, Needs practice/supervision   (Comment: reviewed chin retraction [x10reps] and  scapular retraction [x10reps] and stretching for L lateral neck flexors [3reps 10sec holds]. [WHITESIDES, PT, Jill AlexandersJUSTIN [name] - 04/09/2016 8:18 EDT] )   Daiva NakayamaWHITESIDES, PT, Jill AlexandersJUSTIN [name] - 04/09/2016 8:18 EDT     Physical Therapy Plan of Care :   Needs further teaching, Needs practice/supervision   Wheelchair Positioning :   Needs further teaching, Needs practice/supervision, Verbalizes understanding   Assessment   PT Impairments or Limitations :   Abnormal tone, Balance deficits, Bed mobility deficits, Coordination/Proprioception deficits, Endurance deficits, Equipment training, Impaired sensation, Range of motion deficits, Strength deficits, Transfer deficits, Transition deficits, Wheelchair mobility deficits   Barriers to Safe Discharge PT :   Medical diagnosis   Discharge Recommendations :   ELOS: 3 weeks; home with assist and PWC     PT Treatment Recommendations :   Cont POC established by primary therapist to maximize pt fxnal indep/safety nec for safe home/community reentry c improved pt quality of life and dec caregiver burden.     WHITESIDES, PT, Christine [name] - 04/06/2016 17:15 EDT   Time Spent With Patient   PT Time In :   12:30 EST   PT Time Out :   13:15 EST   PT ADL TRAINING 15 MN :   2 units   PT ADL Training Time :   30  minutes   PT Wheelchair Management Units :   1 units   PT Wheelchair Management Time :   15 minutes   PT Total Individual Therapy Time :   45 minutes   PT Total Timed Code Treatment Units :   3 units   PT Total Timed Code Tx Minutes :   45 minutes   PT Total Treatment Time Rehab :   45 minutes   WHITESIDES, PT, Christine [name] - 04/06/2016 17:15 EDT   Additional Information   Additional Information PT :   Following tx session, pt repositioned in  power WC c seatbelt fastened and family accompanying pt back to hospital room.     WHITESIDES, PT, Ubaldo Glassing - 04/06/2016 17:15 EDT

## 2016-04-06 NOTE — Progress Notes (Signed)
Functional Indep Measure Scores - Text       Functional Independence Measure Scores Entered On:  04/06/2016 0:11 EDT    Performed On:  04/06/2016 0:11 EDT by Elmer Ramp, RN, MARIA NORLIS               Bladder Management Score   Patient's Independence Level with Bladder Management Tasks :   Assistance   Type of Assistance Necessary :   Helper changes linen or clothing/cleans up spill, Helper changes diaper or absorbent pad   Functional Independence Measure Bladder Management :   Total assistance   SIOSON, RN, MARIA NORLIS - 04/06/2016 0:11 EDT   Bowel Management Score   Patient's Independence Level with Bowel Management Tasks :   Assistance   Type of Assistance Necessary :   Helper changes linen or clothing/cleans up spill   Functional Independence Measure Bowel Management :   Total assistance   SIOSON, RN, MARIA NORLIS - 04/06/2016 0:11 EDT   Transfer Bed/Chair/WC Score   Patient's independence Level with Bed, Chair, Wheelchair Tasks :   Assistance   Type of Assistance Necessary :   Mechanical lift required   Bed, Chair, Wheelchair Transfer :   Total assistance   SIOSON, RN, MARIA NORLIS - 04/06/2016 0:11 EDT

## 2016-04-06 NOTE — Progress Notes (Signed)
Functional Indep Measure Scores - Text       Functional Independence Measure Scores Entered On:  04/06/2016 14:35 EDT    Performed On:  04/06/2016 14:33 EDT by Lala LundMEWSHAW, RN, SARAH J-RN               Eating Score   Patient's Independence Level With Eating Tasks :   Setup/Supervision   Supervision or Setup Needed :   Open containers, Pour liquid into containers   Functional Independence Measure Eating :   Supervision or setup   MEWSHAW, RN, [facility] - 04/06/2016 14:33 EDT   Toileting Score   Patient's Independence Level with Toileting Tasks :   Assistance   Type of Assistance Necessary :   Assistance with toileting tasks   Amount of Assistance Needed :   Adjusting clothing before toileting, Adjusting clothing after toileting, Cleansing perineal area   Toileting :   Total assistance   MEWSHAW, RN, [facility] - 04/06/2016 14:33 EDT   Bladder Management Score   Patient's Independence Level with Bladder Management Tasks :   Assistance   Type of Assistance Necessary :   Helper changes linen or clothing/cleans up spill, Helper changes diaper or absorbent pad, Helper initiates timed voiding schedule, Two helpers needed   Functional Independence Measure Bladder Management :   Total assistance   MEWSHAW, RN, [facility] - 04/06/2016 14:33 EDT   Bowel Management Score   Patient's Independence Level with Bowel Management Tasks :   Assistance   Type of Assistance Necessary :   Helper changes linen or clothing/cleans up spill, Two helpers needed   Functional Independence Measure Bowel Management :   Total assistance   MEWSHAW, RN, [facility] - 04/06/2016 14:33 EDT   Transfer Bed/Chair/WC Score   Patient's independence Level with Bed, Chair, Wheelchair Tasks :   Assistance   Type of Assistance Necessary :   Mechanical lift required   Bed, Chair, Wheelchair Transfer :   Total assistance   MEWSHAW, RN, [facility] - 04/06/2016 14:33 EDT   Transfer Toilet Score   Patient's Independence Level with Transfer Toilet Tasks :   Assistance   Amount  of Assistance Necessary :   Mechanical lift required   Transfer Toilet :   Total assistance   Lala LundMEWSHAW, RN, [facility] - 04/06/2016 14:33 EDT

## 2016-04-06 NOTE — Nursing Note (Signed)
Medication Administration Follow Up-Text       Medication Administration Follow Up Entered On:  04/06/2016 6:15 EDT    Performed On:  04/06/2016 3:24 EDT by Elmer RampSIOSON, RN, MARIA NORLIS      Intervention Information:     acetaminophen  Performed by Elmer RampSIOSON, RN, MARIA NORLIS on 04/06/2016 02:24:00 EDT       acetaminophen,650mg   Oral,mild pain (1-3)       Medication Effectiveness Evaluation   Medication Administration Reason :   Pain   Medication Effective :   Yes   Medication Response :   Symptoms improved   SIOSON, RN, MARIA NORLIS - 04/06/2016 6:15 EDT

## 2016-04-06 NOTE — Progress Notes (Signed)
Functional Indep Measure Scores - Text       Functional Independence Measure Scores Entered On:  04/06/2016 17:15 EDT    Performed On:  04/06/2016 13:15 EDT by Daiva NakayamaWHITESIDES, PT, JUSTIN W               Comprehension Score   Mode of Comprehension :   Auditory   Comprehends Complex or Abstract Information Without Prompting or Cueing :   Yes   Understands Complex or Abstract Directions and Conversations :   Mild difficulty, Requires extra time   Comprehension Indep Measure Interim :   Modified independence   WHITESIDES, PT, JUSTIN W - 04/06/2016 17:14 EDT   Expression Score   Expression Mode :   Vocal   Expresses Complex or Abstract Information Without Prompting or Cueing :   Yes   Expresses Complex or Abstract Ideas :   Clearly and fluently at all times   Expression Indep Measure Interim :   Complete independence   WHITESIDES, PT, Ubaldo GlassingJUSTIN W - 04/06/2016 17:14 EDT   Social Interaction Score   Interacts Appropriately Without Supervision :   No   Appropriate in Social Situations :   Standby, 90 percent, requires prompting 10 percent or less   Social Interaction Indep Measure Interim :   Standby prompting   WHITESIDES, PT, Ubaldo GlassingJUSTIN W - 04/06/2016 17:14 EDT   Problem Solving Score   Solves Complex Problems :   No   Ability to Solve Routine Problems :   Standby, 90 percent, requires prompting 10 percent or less   Problem Solving Indep Measure Interim :   Standby prompting   WHITESIDES, PT, JUSTIN W - 04/06/2016 17:14 EDT   Memory Score   Recognizes, Remembers Routines, and Executes Requests Without Prompting :   No   Remembers and Executes Requests With Prompting :   Standby, 90 percent, requires prompting 10 percent or less   Memory Indep Measure Interim :   Standby prompting   WHITESIDES, PT, JUSTIN W - 04/06/2016 17:14 EDT

## 2016-04-06 NOTE — Progress Notes (Signed)
Functional Indep Measure Scores - Text       Functional Independence Measure Scores Entered On:  04/06/2016 22:52 EDT    Performed On:  04/06/2016 22:52 EDT by Elmer RampSIOSON, RN, MARIA NORLIS               Bladder Management Score   Patient's Independence Level with Bladder Management Tasks :   Assistance   Type of Assistance Necessary :   Helper changes linen or clothing/cleans up spill, Helper changes diaper or absorbent pad   Functional Independence Measure Bladder Management :   Total assistance   SIOSON, RN, MARIA NORLIS - 04/06/2016 22:52 EDT   Bowel Management Score   Patient's Independence Level with Bowel Management Tasks :   Assistance   Type of Assistance Necessary :   Helper positions and holds bedpan, assists patient to roll on AND off the bedpan, Helper changes linen or clothing/cleans up spill, Helper changes diaper   Functional Independence Measure Bowel Management :   Total assistance   SIOSON, RN, MARIA NORLIS - 04/06/2016 22:52 EDT   Transfer Bed/Chair/WC Score   Patient's independence Level with Bed, Chair, Wheelchair Tasks :   Assistance   Type of Assistance Necessary :   Mechanical lift required   Bed, Chair, Wheelchair Transfer :   Total assistance   SIOSON, RN, MARIA NORLIS - 04/06/2016 22:52 EDT

## 2016-04-07 NOTE — Nursing Note (Signed)
Medication Administration Follow Up-Text       Medication Administration Follow Up Entered On:  04/07/2016 18:18 EDT    Performed On:  04/07/2016 16:12 EDT by Gwyneth Sprout, RN, DEBBIE D      Intervention Information:     acetaminophen  Performed by Gwyneth Sprout, RN, DEBBIE D on 04/07/2016 15:12:00 EDT       acetaminophen,650mg   Oral,mild pain (1-3)       Medication Effectiveness Evaluation   Medication Administration Reason :   Pain   Medication Effective :   Yes   Medication Response :   Symptoms improved   EPSTEIN, RN, DEBBIE D - 04/07/2016 18:18 EDT

## 2016-04-07 NOTE — Progress Notes (Signed)
PT Inpatient Daily Documentation - Text       PT Inpatient Daily Documentation Entered On:  04/07/2016 15:47 EDT    Performed On:  04/07/2016 12:30 EDT by CLINE, PT, [facility] K               Reason for Treatment   Subjective Statement :   Pt voicing desire to attempt to stand this PM as she had with OT in AM session, however after t/f to mat found pt incont. of urine, voicing need for BM and transferred to bed for pants management and use of bed pan- therapist requesting RSCC use but pt requesting bed pan     *Reason for Referral :   Progressive MS (dx 1989)  new weakness following UTI and C-diff    3 hrs/day     *Chief Complaint :   weakness bilat LE's, tightness R ankle/calf     CLINE, PT, SARAH K - 04/07/2016 15:39 EDT   Review/Treatments Provided   PT Goals :   PT Short Term Goals    04/06/2016  Bed Mobility Goal #1: Roll to right and left; Mod A; Progressing, continue  Transfer Goal #1: Transfer board; Mod A; Progressing, continue  Wheelchair Management Goal #3: Power wheelchair mobility indoors; Setup, Distant S; Initial  Wheelchair Management Goal #4: Pressure relief tilt in space (power wheelchair); Mod I; Initial  Balance Goal #1: Unsupported short sit; Mod A; Static sitting; 2; Improve independence with activities of daily living; Progressing, continue     PT Plan :   Treatment Frequency:  Daily (modified)   Performed By: Eliseo Gum, PT, AMBER R  03/30/2016 09:30  Treatment Duration: 3 Performed By: Carney Living, AMBER R  03/30/2016 09:30  Planned Treatments: Balance training, Basic activities of daily living, Bed mobility training, Caregiver training, Electrical stimulation, Equipment training, Functional training, Manual therapy, Neuromuscular reeducation, Pain management, Posture/Body mechanics training,... Performed By: Carney Living, AMBER R  03/30/2016 09:30     Short Term Goals Reviewed :   Yes   Physical Therapy Orders :   Physical Therapy Inpatient Additional Treatment Rehab - 03/30/16 11:39:51 EDT, Balance  training, Basic activities of daily living, Bed mobility training, Caregiver training, Electrical stimulation, Equipment training, Functional training, Manual therapy, Neuromuscular reeducation, Pain management, Posture/...  PT FIMS - 03/29/16 19:38:00 EDT, Daily     Pain Present :   No actual or suspected pain   Voided :   Incontinent   PT Therapeutic Activity,Mobility,Balance :   Yes   CLINE, PT, SARAH K - 04/07/2016 15:39 EDT   Therapeutic Activities/Mobility/Balance   Functional Activity :   Therapeutic Activities  Activity 1:  Transfer training, Other: (level surfaces, WC<>mat, lat scoot); Total A; Sliding board, Other: power WC; Other: Dep for setup of txfr board; max instruction from PT for setup/lat scoot tech using head/hips and momentum strategy; dep for repositioning BLEs t/o txfr for inc pt safety.; Dep txfr power WC<>mat for repositioning pt cushion/wedges under R side of back of cushion for improved fxnal positioning/alignment/pressure mgmt in power WC; pt dep for txfr 2* tetraparesis and impd trunk control from MS flare up/deconditioning.       Performed Date:  04/06/2016  Activity 2:  Other: Positioning in WC; Total A; Other: power WC; Assisted pt c repositioning WC cushion, wedges under WC cushion, BLEs, and pelvis in WC for improved fxnal positioning, postural support/alignment, pressure redistribution nec for protection of skin integrity; pt dep 2* MS related tetraparesis.  Performed Date:  04/06/2016  Activity 3:  Transitional movement; Setup, Minimal assistance; Demonstrated positive response to treatment, Improved timing, control, and coordination, Increased activation of targeted muscle group(s); lateral propped on L elbow and return to midline sitting with SPV, occ min A 3 x 5 reps for improved indep with placement of SB for transfers. VC to maintain ant WS and hand placement.       Performed Date:  04/05/2016  Activity 4:  Transitional movement; lateral propped on L elbow to encourage  appropriate trunk righting  and improve trunk ROM to L sidebending. Balance on L propped elbow with close SPV and VC for use of head/shoulders to control balance position.       Performed Date:  04/02/2016     Gillermo Murdoch - 04/07/2016 15:39 EDT   PT Therapeutic Activities Grid     Activity 1  Activity 2  Activity 3      Activity :    Transfer training   Transfer training   Transfer training        Assist :    Maximal assistance   Total assistance   Total assistance        Comment :    Pt setup and required mod A for ant. scoot at [facility] alt. R<>L, required 3x scoot total A toward L and max A x 1 scoot toward R to return to w/c. Pt attempted stand pivot per OT voicing standing during AM however total A and unable to fully stand, discont.    per OT description of AM, ultramove trialed for sit<>stand for pants management and hygiene, however unable to assist to stand w/total A from [facility] for toileting.    Pt completed lateral scoot >L from PWC>bed for toileting w/total A 2/2 time restrictions at end of session. RN present w/pt on bed pan.          CLINE, PT, [facility] K - 04/07/2016 15:39 EDT  CLINE, PT, [facility] K - 04/07/2016 15:39 EDT  [place], PT, [facility] K - 04/07/2016 15:39 EDT       Reassess Mobility :   Yes   CLINE, PT, SARAH K - 04/07/2016 15:39 EDT   PT Mobility   Mobility Grid   Transfer Bed to and From Chair :   Rehab Total assistance   Transfer Toilet :   Total assistance   CLINE, PT, [facility] K - 04/07/2016 15:39 EDT   Amb Ability Varied Surf/Distraction Grid   Level Surfaces :   Does not occur   Uneven Surfaces :   Does not occur   Distracting Environments :   Does not occur   Curbs :   Does not occur   Stairs :   Does not occur   Ramp :   Does not occur   [place], PT, [facility] K - 04/07/2016 15:39 EDT   Transfer Type :   Transfer board   PT Mobility Reviewed :   Yes   CLINE, PT, SARAH K - 04/07/2016 15:39 EDT   Short Term Goals   Bed Mobility Goal Grid     Goal #1          Descriptors :    Roll to right and left               Level :    Moderate assistance              Status :    Progressing, continue  CLINE, PT, [facility]ARAH K - 04/07/2016 15:39 EDT         Transfers Goal Grid     Goal #1          Descriptors :    Transfer board              Level :    Moderate assistance              Status :    Progressing, continue                CLINE, PT, SARAH K - 04/07/2016 15:39 EDT         W/C Management Grid     Goal #1  Goal #2  Goal #3  Goal #4    Descriptors :    Power wheelchair mobility indoors   Pressure relief tilt in space (power wheelchair)   Power wheelchair mobility indoors   Pressure relief tilt in space (power wheelchair)     Level :    Minimal assistance   Minimal assistance   Setup, Distant supervision   Modified independence     Status :    Goal met   Goal met   Initial   Initial     Date Met :    04/06/2016 EDT   04/06/2016 EDT             [facility] - 04/07/2016 15:39 EDT  CLINE, PT, SARAH K - 04/07/2016 15:39 EDT  CLINE, PT, SARAH K - 04/07/2016 15:39 EDT  CLINE, PT, SARAH K - 04/07/2016 15:39 EDT      PT Balance Goal Grid     Goal #1          Descriptor :    Unsupported short sit              Assist Level :    Moderate assistance              Type :    Static sitting              Length of Time (minutes) :    2 minutes              Rationale :    Improve independence with activities of daily living              Status :    Progressing, continue                [facility] - 04/07/2016 15:39 EDT         PT ST Goals Reviewed :   Sandi CarneYes   CLINE, [facility], [facility] - 04/07/2016 15:39 EDT   Education   Responsible Learner Present for Session :   Yes   Home Caregiver Name/Relationship :   Toy Cookeylfred Castrellon-spouse  daughterWaynetta Sandy- beth   [place], [place], [place] FretSARAH K - 04/07/2016 15:39 EDT   Physical Therapy Education Grid   Skin Care :   Verbalizes understanding, Needs practice/supervision   CLINE, [facility], [facility] - 04/07/2016 15:39 EDT   PT Additional Education :   Pt voicing to therapist no urge for urinating but when therapist stepped out of room  overheard pt mentioning to daughter after further questioning she did have the urge to urinate. educated pt on prompting staff at initiation of tx for trial of RSCC and pants management vs. incont. and need to discont. tx. pt hesitant 2/2 (-) feedback from staff on previous day.  CLINE, PT, [facility] K - 04/07/2016 15:39 EDT   Assessment   PT Impairments or Limitations :   Abnormal tone, Balance deficits, Bed mobility deficits, Coordination/Proprioception deficits, Endurance deficits, Equipment training, Impaired sensation, Range of motion deficits, Strength deficits, Transfer deficits, Transition deficits, Wheelchair mobility deficits   Barriers to Safe Discharge PT :   Medical diagnosis   Discharge Recommendations :   ELOS: 3 weeks; home with assist and PWC     PT Treatment Recommendations :   Pt demonstrates cont. need for lateral scooting with transfers, cont. weakness limiting use of BLE's in transfers. will cont. to benefit from skilled PT to promote indep. w/all fxnl mobility.     CLINE, PT, [facility] K - 04/07/2016 15:39 EDT   Time Spent With Patient   PT Time In :   12:30 EST   PT Time Out :   13:30 EST   PT ADL TRAINING 15 MN :   4 units   PT ADL Training Time :   60 minutes   PT Total Individual Therapy Time :   60 minutes   PT Total Timed Code Treatment Units :   4 units   PT Total Timed Code Tx Minutes :   60 minutes   PT Total Treatment Time Rehab :   60 minutes   CLINE, PT, SARAH K - 04/07/2016 15:39 EDT

## 2016-04-07 NOTE — Progress Notes (Signed)
Functional Indep Measure Scores - Text       Functional Independence Measure Scores Entered On:  04/07/2016 15:39 EDT    Performed On:  04/07/2016 15:39 EDT by Alberteen SpindleLINE, PT, [facility]               Transfer Bed/Chair/WC Score   Patient's independence Level with Bed, Chair, Wheelchair Tasks :   Assistance   Type of Assistance Necessary :   Assistance for more than half (patient performs 25% - 49% of tasks)   Bed, Chair, Wheelchair Transfer :   Maximal assistance   [place], PT, [place] K - 04/07/2016 15:39 EDT   Comprehension Score   Mode of Comprehension :   Auditory   Comprehends Complex or Abstract Information Without Prompting or Cueing :   Yes   Understands Complex or Abstract Directions and Conversations :   At all times   Comprehension Indep Measure Interim :   Complete independence   CLINE, PT, [facility] - 04/07/2016 15:39 EDT   Expression Score   Expression Mode :   Vocal   Expresses Complex or Abstract Information Without Prompting or Cueing :   Yes   Expresses Complex or Abstract Ideas :   Clearly and fluently at all times   Expression Indep Measure Interim :   Complete independence   CLINE, PT, [facility] - 04/07/2016 15:39 EDT   Social Interaction Score   Interacts Appropriately Without Supervision :   Yes   Interacts Appropriately :   At all times   Social Interaction Indep Measure Interim :   Complete independence   CLINE, [facility], [facility] - 04/07/2016 15:39 EDT   Problem Solving Score   Solves Complex Problems :   Yes   Ability to Solve Complex Problems :   Consistently solves problems independently   Problem Solving Indep Measure Interim :   Complete independence   CLINE, PT, SARAH K - 04/07/2016 15:39 EDT   Memory Score   Recognizes, Remembers Routines, and Executes Requests Without Prompting :   Yes   Remembers and Executes Requests :   Consistently without need for repetition   Memory Indep Measure Interim :   Complete independence   CLINE, [facility], [facility] - 04/07/2016 15:39 EDT

## 2016-04-07 NOTE — Progress Notes (Signed)
Functional Indep Measure Scores - Text       Functional Independence Measure Scores Entered On:  04/07/2016 16:02 EDT    Performed On:  04/07/2016 16:01 EDT by Gwyneth SproutEPSTEIN, RN, DEBBIE D               Eating Score   Patient's Independence Level With Eating Tasks :   Setup/Supervision   Supervision or Setup Needed :   Cut food, Open containers   Functional Independence Measure Eating :   Supervision or setup   EPSTEIN, RN, DEBBIE D - 04/07/2016 16:01 EDT   Bladder Management Score   Patient's Independence Level with Bladder Management Tasks :   Assistance   Type of Assistance Necessary :   Helper changes linen or clothing/cleans up spill, Helper changes diaper or absorbent pad   Functional Independence Measure Bladder Management :   Total assistance   EPSTEIN, RN, DEBBIE D - 04/07/2016 16:01 EDT   Bowel Management Score   Patient's Independence Level with Bowel Management Tasks :   Assistance   Type of Assistance Necessary :   Helper positions and holds bedpan, assists patient to roll on AND off the bedpan, Helper changes linen or clothing/cleans up spill   Functional Independence Measure Bowel Management :   Total assistance   EPSTEIN, RN, DEBBIE D - 04/07/2016 16:01 EDT

## 2016-04-07 NOTE — Progress Notes (Signed)
OT Inpatient Daily Documentation - Text       OT Inpatient Daily Documentation Entered On:  04/07/2016 11:58 EDT    Performed On:  04/07/2016 11:40 EDT by Eliseo GumBENTON, OT, ANN K               Reason for Treatment   *Reason for Referral :   Admitted for MS exacerbation    82 yo female with past medical history of progressive MS diagnosed in 1989, CVA with right sided weakness, hypothyroidism and neurogenic bladder [facility] [place] with recurrent generalized weakness of the upper and lower extremities and poor posture. She has been wheelchair bound for the past few years due to right-sided weakness.    (per pt, she did not have a CVA)     *Chief Complaint :   weakness     BENTON, OT, ANN K - 04/07/2016 11:40 EDT   Review/Treatments Provided   OT Goals :   OT Short Term Goals    03/31/2016  Eating Goal #1: Manage food containers/packages; Distant S; Initial  Grooming Goal #1: Grooming routine; Supported short sit, Unsupported short sit, Wheelchair; CGA; Initial  Upper Body Dressing Goal #1: Upper extremity dressing; Mod A; Initial  Lower Body Dressing Goal #1: Lower extremity dressing; Supine, Supported short sit, Unsupported short sit; Max A; Initial  Bathing Goal #1: Bathe; Supported short sit, Unsupported short sit, Wheelchair; Max A; Initial  Toileting and Transfers Goal #1: Toilet transfers; Max A; Bedside commode; Drop arm commode, Elevated toilet seat, Grab bars, Sliding board; Initial  Toileting and Transfers Goal #2: Toileting; Max A; Drop arm commode, Elevated toilet seat, Grab bars, Sliding board; Initial  Balance Goal #1: Unsupported short sit; Static sitting; CGA; 5; Improve independence with activities of daily living; Initial     OT Plan :   Treatment Frequency: Daily Performed By: Carilyn GoodpastureHOBBY, OT, LESLIE M   03/30/2016  Treatment Duration: 3 Performed By: Katherine RoanHOBBY, OT, LESLIE M   03/30/2016  Planned Treatments: Balance training, Basic Activities of Daily Living, Caregiver training,  Coordination, Energy conservation training, Equipment training, Group therapy, Mobility training, Patient education, Safety education, Therapeutic activities, Therapeutic exercises... Performed By: Katherine RoanHOBBY, OT, LESLIE M   03/30/2016     Occupational Therapy Orders :   Occupational Therapy Inpatient Additional Treatment Rehab - 03/30/16 16:40:41 EDT, Balance training, Basic Activities of Daily Living, Caregiver training, Coordination, Energy conservation training, Equipment training, Group therapy, Mobility training, Patient education, Safety education, Therapeutic activitie...  OT FIMS - 03/29/16 19:38:00 EDT, Daily     Pain Present :   No actual or suspected pain   OT Therapeutic Activity,Mobility,Balance :   Yes   BENTON, OT, ANN K - 04/07/2016 11:40 EDT   Therapeutic Activities   OT Therapeutic Activities RTF :   Therapeutic Activities  Activity 1:  Pt transferred  w/c>bed with hoyer, doffed LB clothing, bed>BSC with hoyer, cleaned up after toileting, BSC>bed with hoyer, and donned brief with total A.       Performed Date:  04/04/2016     Wyonia HoughBENTON, OT, ANN K - 04/07/2016 11:40 EDT   OT Therapeutic Activities Grid     Activity 1  Activity 2  Activity 3  Activity 4    Activities :    Transitional movement   Grasp activities   Transitional movement   Grasp activities, Reaching activities     Position :       Sitting in wheelchair  Comments :    Sitting in w/c performed shift to left side on mat to wt bear on forearm and then return to sitting upright x5. Performed leaning forward flexing at hips and returning to upright using UEs and core strength to return to upright x 5.    Utilized washcloth in water for patient to work on increasing grip strength and stabilizing force by wringing out cloth x 10.    Practiced sit to stand x1; blocked knees, pt put UEs around therapist and on 3 came to upright position with max assist but pt able to tolerate position once upright for 5 seconds. Pt pleased to try.      Utilized  cones for reaching with both hands across midline and then placing to same side of UE being used - grasp and release of cones.  Right side more difficult to release objects.         BENTON, OT, ANN K - 04/07/2016 11:40 EDT  BENTON, OT, ANN K - 04/07/2016 11:40 EDT  BENTON, OT, ANN K - 04/07/2016 11:40 EDT  BENTON, OT, ANN K - 04/07/2016 11:40 EDT      Assessment   OT Impairments or Limitations :   Balance deficits, Basic activity of daily living deficits, Coordination deficits, Endurance deficits, Equipment training, IADL deficits, Mobility deficits, Proprioception deficits, Safety awareness deficits, Strength deficits   OT Discharge Recommendations :   ELOS 3 weeks then home with spouse and power w/c     OT Treatment Recommendations :   Tolerated session well. Motivated. Working on core and trunk balance with transitional movements.      BENTON, OT, ANN K - 04/07/2016 11:40 EDT   Time Spent With Patient   OT Time In :   8:35 EST   OT Time Out :   9:35 EST   OT Functional Activities Minutes :   60 minutes   OT FUNCTIONAL TRNG 15 MIN :   4    OT Total Individual Therapy Time :   60 minutes   OT Total Timed Code Treatment Units :   4 units   OT Total Timed Code Treatment Minutes :   60 minutes   OT Total Treatment Time Rehab :   60 minutes   BENTON, OT, ANN K - 04/07/2016 11:40 EDT

## 2016-04-07 NOTE — Progress Notes (Signed)
Functional Indep Measure Scores - Text       Functional Independence Measure Scores Entered On:  04/07/2016 23:58 EDT    Performed On:  04/07/2016 23:57 EDT by Laural BenesJOHNSON, RN, AMY L               Eating Score   Patient's Independence Level With Eating Tasks :   Setup/Supervision   Supervision or Setup Needed :   Open containers   Functional Independence Measure Eating :   Supervision or setup   Laural BenesJOHNSON, RN, AMY L - 04/07/2016 23:57 EDT   Bladder Management Score   Patient's Independence Level with Bladder Management Tasks :   Assistance   Type of Assistance Necessary :   Helper changes linen or clothing/cleans up spill   Functional Independence Measure Bladder Management :   Total assistance   Laural BenesJOHNSON, RN, AMY L - 04/07/2016 23:57 EDT   Bowel Management Score   Patient's Independence Level with Bowel Management Tasks :   Assistance   Type of Assistance Necessary :   Helper changes linen or clothing/cleans up spill   Functional Independence Measure Bowel Management :   Total assistance   JOHNSON, RN, AMY L - 04/07/2016 23:57 EDT

## 2016-04-07 NOTE — Progress Notes (Signed)
Functional Indep Measure Scores - Text       Functional Independence Measure Scores Entered On:  04/07/2016 11:40 EDT    Performed On:  04/07/2016 11:39 EDT by Eliseo GumBENTON, OT, ANN K               Comprehension Score   Mode of Comprehension :   Auditory   Comprehends Complex or Abstract Information Without Prompting or Cueing :   Yes   Understands Complex or Abstract Directions and Conversations :   Requires extra time   Comprehension Indep Measure Interim :   Modified independence   BENTON, OT, ANN K - 04/07/2016 11:39 EDT   Expression Score   Expression Mode :   Vocal   Expresses Complex or Abstract Information Without Prompting or Cueing :   Yes   Expresses Complex or Abstract Ideas :   Clearly and fluently at all times   Expression Indep Measure Interim :   Complete independence   BENTON, OT, ANN K - 04/07/2016 11:39 EDT   Social Interaction Score   Interacts Appropriately Without Supervision :   Yes   Interacts Appropriately :   Requires more than reasonable time to make decisions   Social Interaction Indep Measure Interim :   Modified independence   BENTON, OT, ANN K - 04/07/2016 11:39 EDT   Problem Solving Score   Solves Complex Problems :   No   Ability to Solve Routine Problems :   Standby, 90 percent, requires prompting 10 percent or less   Problem Solving Indep Measure Interim :   Standby prompting   BENTON, OT, ANN K - 04/07/2016 11:39 EDT   Memory Score   Recognizes, Remembers Routines, and Executes Requests Without Prompting :   No   Remembers and Executes Requests With Prompting :   Standby, 90 percent, requires prompting 10 percent or less   Memory Indep Measure Interim :   Standby prompting   BENTON, OT, ANN K - 04/07/2016 11:39 EDT

## 2016-04-08 NOTE — Progress Notes (Signed)
PT Inpatient Daily Documentation - Text       PT Inpatient Daily Documentation Entered On:  04/08/2016 16:20 EDT    Performed On:  04/08/2016 14:00 EDT by Eliseo Gum, PT, AMBER R               Reason for Treatment   *Reason for Referral :   Progressive MS (dx 1989)  new weakness following UTI and C-diff    3 hrs/day     *Chief Complaint :   weakness bilat LE's, tightness R ankle/calf     BENTON, PT, AMBER R - 04/08/2016 16:18 EDT   Review/Treatments Provided   PT Goals :   PT Short Term Goals    04/08/2016  Bed Mobility Goal #1: Roll to right and left; Mod A; Progressing, continue  Transfer Goal #1: Transfer board; Mod A; Progressing, continue  Wheelchair Management Goal #3: Power wheelchair mobility indoors; Setup, Distant S; Initial  Wheelchair Management Goal #4: Pressure relief tilt in space (power wheelchair); Mod I; Initial  Balance Goal #1: Unsupported short sit; Mod A; Static sitting; 2; Improve independence with activities of daily living; Progressing, continue     PT Plan :   Treatment Frequency:  Daily (modified)   Performed By: Eliseo Gum, PT, AMBER R  03/30/2016 09:30  Treatment Duration: 3 Performed By: Carney Living, AMBER R  03/30/2016 09:30  Planned Treatments: Balance training, Basic activities of daily living, Bed mobility training, Caregiver training, Electrical stimulation, Equipment training, Functional training, Manual therapy, Neuromuscular reeducation, Pain management, Posture/Body mechanics training,... Performed By: Carney Living, AMBER R  03/30/2016 09:30     Short Term Goals Reviewed :   Yes   Physical Therapy Orders :   Physical Therapy Inpatient Additional Treatment Rehab - 03/30/16 11:39:51 EDT, Balance training, Basic activities of daily living, Bed mobility training, Caregiver training, Electrical stimulation, Equipment training, Functional training, Manual therapy, Neuromuscular reeducation, Pain management, Posture/...  PT FIMS - 03/29/16 19:38:00 EDT, Daily     Pain Present :   No actual or  suspected pain   PT Therapeutic Activity,Mobility,Balance :   Yes   BENTON, PT, AMBER R - 04/08/2016 16:18 EDT   Therapeutic Activities/Mobility/Balance   Functional Activity :   Therapeutic Activities  Activity 1:  Transfer training; Max A; Pt setup and required mod A for ant. scoot at [facility] alt. R<>L, required 3x scoot total A toward L and max A x 1 scoot toward R to return to w/c. Pt attempted stand pivot per OT voicing standing during AM however total A and unable to fully stand, discont.       Performed Date:  04/07/2016  Activity 2:  Transfer training; Total A; per OT description of AM, ultramove trialed for sit<>stand for pants management and hygiene, however unable to assist to stand w/total A from [facility] for toileting.       Performed Date:  04/07/2016  Activity 3:  Transfer training; Total A; Pt completed lateral scoot >L from PWC>bed for toileting w/total A 2/2 time restrictions at end of session. RN present w/pt on bed pan.       Performed Date:  04/07/2016  Activity 4:  Transitional movement; lateral propped on L elbow to encourage appropriate trunk righting  and improve trunk ROM to L sidebending. Balance on L propped elbow with close SPV and VC for use of head/shoulders to control balance position.       Performed Date:  04/02/2016     Eliseo Gum PT, AMBER R - 04/08/2016  16:18 EDT   BENTON, PT, AMBER R - 04/08/2016 16:18 EDT   PT Therapeutic Activities Grid     Activity 2  Activity 1        Activity :       Tolerance to upright position           Assist :       Total assistance           Position :       Supported stand           Equipment :       Stander           Response :       Demonstrated positive response to treatment, Improved stability in muscle group(s), Improved timing, control, and coordination, Increased activation of targeted muscle group(s)           Comment :    (cont) position with mirror for improved visual feedback. total time in standing frame 60 min.   incremental increase into full standing  position with no S&S of orthostatic hypotention. Total A to maintain midline trunk position and cervical spine orientation during transistion. Able to maintain neutral cervical position once in standing (cont)             BENTON, PT, AMBER R - 04/08/2016 16:29 EDT  BENTON, PT, AMBER R - 04/08/2016 16:29 EDT        Reassess Mobility :   Yes   BENTON, PT, AMBER R - 04/08/2016 16:18 EDT   PT Mobility   Mobility Grid   Roll Left :   Rehab Maximal assistance   Roll Right :   Rehab Total assistance   Roll Supine :   Rehab Total assistance   Supine to Sit :   Rehab Maximal assistance   Sit to Supine :   Rehab Total assistance   Scooting :   Rehab Total assistance   Transfer Bed to and From Chair :   Rehab Total assistance   Transfer Toilet :   Total assistance   BENTON, PT, AMBER R - 04/08/2016 16:18 EDT   Amb Ability Varied Surf/Distraction Grid   Level Surfaces :   Does not occur   Uneven Surfaces :   Does not occur   Distracting Environments :   Does not occur   Curbs :   Does not occur   Stairs :   Does not occur   Ramp :   Does not occur   BENTON, PT, AMBER R - 04/08/2016 16:18 EDT   Transfer Type :   Transfer board   PT Mobility Reviewed :   Yes   BENTON, PT, AMBER R - 04/08/2016 16:18 EDT   Short Term Goals   Bed Mobility Goal Grid     Goal #1          Descriptors :    Roll to right and left              Level :    Moderate assistance              Status :    Progressing, continue                BENTON, PT, AMBER R - 04/08/2016 16:18 EDT         Transfers Goal Grid     Goal #1          Descriptors :    Fish farm manager  Level :    Moderate assistance              Status :    Progressing, continue                BENTON, PT, AMBER R - 04/08/2016 16:18 EDT         W/C Management Grid     Goal #1  Goal #2  Goal #3  Goal #4    Descriptors :    Power wheelchair mobility indoors   Pressure relief tilt in space (power wheelchair)   Power wheelchair mobility indoors   Pressure relief tilt in space (power wheelchair)      Level :    Minimal assistance   Minimal assistance   Setup, Distant supervision   Modified independence     Status :    Goal met   Goal met   Initial   Initial     Date Met :    04/06/2016 EDT   04/06/2016 EDT             BENTON, PT, AMBER R - 04/08/2016 16:18 EDT  BENTON, PT, AMBER R - 04/08/2016 16:18 EDT  BENTON, PT, AMBER R - 04/08/2016 16:18 EDT  BENTON, PT, AMBER R - 04/08/2016 16:18 EDT      PT Balance Goal Grid     Goal #1          Descriptor :    Unsupported short sit              Assist Level :    Moderate assistance              Type :    Static sitting              Length of Time (minutes) :    2 minutes              Rationale :    Improve independence with activities of daily living              Status :    Progressing, continue                BENTON, PT, AMBER R - 04/08/2016 16:18 EDT         PT ST Goals Reviewed :   Yes   BENTON, PT, AMBER R - 04/08/2016 16:18 EDT   Assessment   PT Impairments or Limitations :   Abnormal tone, Balance deficits, Bed mobility deficits, Coordination/Proprioception deficits, Endurance deficits, Equipment training, Impaired sensation, Range of motion deficits, Strength deficits, Transfer deficits, Transition deficits, Wheelchair mobility deficits   Barriers to Safe Discharge PT :   Medical diagnosis   Discharge Recommendations :   ELOS: 3 weeks; home with assist and PWC     PT Treatment Recommendations :   Pt tolerated upright standing in standing frame well today. She still does not demonstrate sufficient LE strenght in quads and gluts to perform sit to stand without standing frame at this time. She will cont to benefit from skilled PT to address above impairments and progress indep with functional mobility and transfers prior to DC home.      BENTON, PT, AMBER R - 04/08/2016 16:29 EDT   Time Spent With Patient   PT Time In :   14:00 EST   PT Time Out :   14:30 EST   PT ADL TRAINING 15 MN :   5 units   PT ADL Training  Time :   75 minutes   PT Wheelchair Management Units :   1  units   PT Wheelchair Management Time :   15 minutes   PT Total Individual Therapy Time :   90 minutes   PT Total Timed Code Treatment Units :   6 units   PT Total Timed Code Tx Minutes :   90 minutes   PT Total Treatment Time Rehab :   90 minutes   BENTON, PT, AMBER R - 04/08/2016 16:29 EDT   Additional Information   Additional Information PT :   Educated pt and family on proper positioning in power wheelchair with R hip obliquity wedges, alignement of cushion and gel and pt orientation. Edu on use of tilt combined with recline and PELR to change position and rest in wheelchair.      BENTON, PT, AMBER R - 04/08/2016 16:29 EDT

## 2016-04-08 NOTE — Progress Notes (Signed)
OT Inpatient Daily Documentation - Text       OT Inpatient Daily Documentation Entered On:  04/08/2016 15:23 EDT    Performed On:  04/08/2016 7:30 EDT by Marzella Schlein, OT, Christine [name]               Reason for Treatment   *Reason for Referral :   Admitted for MS exacerbation    82 yo female with past medical history of progressive MS diagnosed in 1989, CVA with right sided weakness, hypothyroidism and neurogenic bladder admitted to [facility] in [place], [place] on 03/06/16 with recurrent generalized weakness of the upper and lower extremities and poor posture. She has been wheelchair bound for the past few years due to right-sided weakness.    (per pt, she did not have a CVA)     *Chief Complaint :   weakness     BRAATZ, OT, Christine [name] - 04/08/2016 15:17 EDT   Review/Treatments Provided   OT Goals :   OT Short Term Goals    04/08/2016  Eating Goal #1: Manage food containers/packages; Distant S; Progressing, continue  Upper Body Dressing Goal #2: Upper extremity dressing; Minimal assistance; Initial  Lower Body Dressing Goal #1: Lower extremity dressing; Supine, Supported short sit, Unsupported short sit; Max A; Progressing, continue  Toileting and Transfers Goal #1: Toilet transfers; Max A; Bedside commode; Drop arm commode, Elevated toilet seat, Grab bars, Sliding board; Progressing, continue  Toileting and Transfers Goal #2: Toileting; Max A; Drop arm commode, Elevated toilet seat, Grab bars, Sliding board; Progressing, continue  Balance Goal #1: Unsupported short sit; Static sitting; CGA; 5; Improve independence with activities of daily living; Initial     OT Plan :   Treatment Frequency: Daily Performed By: Carilyn Goodpasture M   03/30/2016  Treatment Duration: 3 Performed By: Katherine Roan   03/30/2016  Planned Treatments: Balance training, Basic Activities of Daily Living, Caregiver training, Coordination, Energy conservation training, Equipment training, Group therapy, Mobility training, Patient education,  Safety education, Therapeutic activities, Therapeutic exercises... Performed By: Katherine Roan   03/30/2016     Short Term Goals Reviewed :   Yes   Occupational Therapy Orders :   Occupational Therapy Inpatient Additional Treatment Rehab - 03/30/16 16:40:41 EDT, Balance training, Basic Activities of Daily Living, Caregiver training, Coordination, Energy conservation training, Equipment training, Group therapy, Mobility training, Patient education, Safety education, Therapeutic activitie...  OT FIMS - 03/29/16 19:38:00 EDT, Daily     Pain Present :   No actual or suspected pain   OT Therapeutic Activity,Mobility,Balance :   Yes   BRAATZ, OT, Christine [name] - 04/08/2016 15:17 EDT   Therapeutic Activities   OT Therapeutic Activities RTF :   Therapeutic Activities  Activity 1:  Transitional movement; Sitting in w/c performed shift to left side on mat to wt bear on forearm and then return to sitting upright x5. Performed leaning forward flexing at hips and returning to upright using UEs and core strength to return to upright x 5.       Performed Date:  04/07/2016  Activity 2:  Grasp activities; Sitting in wheelchair; Utilized washcloth in water for patient to work on increasing grip strength and stabilizing force by wringing out cloth x 10.       Performed Date:  04/07/2016  Activity 3:  Transitional movement; Practiced sit to stand x1; blocked knees, pt put UEs around therapist and on 3 came to upright position with max assist but pt able  to tolerate position once upright for 5 seconds. Pt pleased to try.       Performed Date:  04/07/2016  Activity 4:  Grasp activities, Reaching activities; Utilized cones for reaching with both hands across midline and then placing to same side of UE being used - grasp and release of cones.  Right side more difficult to release objects.       Performed Date:  04/07/2016     Reassess Activities of Daily Living :   Yes   [place], OT, Christine [name] - 04/08/2016 15:17 EDT   OT Basic ADL    Basic ADL Grid   Eating :   Supervision or setup   Grooming :   Supervision or setup   Bathing :   Total assistance   UE Dressing :   Moderate assistance   LE Dressing :   Total assistance   Toileting :   Total assistance   Transfer Toilet :   Total assistance   Tub Transfer :   Does not occur   Shower Transfer :   Total assistance   [place], OT, Gardiner Ramus - 04/08/2016 15:17 EDT   ADL Comments :   ADL 04/08/2016 - Hoyered to shower chair.  Pt able to bathe chest, abdomin, UE.  Assist for peri, buttocks, BLE, and UE.  Pt mod A to don bra in shower chair.  Min A to don [facility] dress.  LB dressing total A.  Pt performed grooming sink side with setup and eat breakfast in bed with setup of supplies.         BRAATZ, OT, Christine [name] - 04/08/2016 15:17 EDT   Short Term Goals   Eating Goal Grid     Goal #1          Activity :    Manage food containers/packages              Assist :    Distant supervision              Status :    Progressing, continue              Comment :    Pt requires assist to open packages on 04/08/2016                BRAATZ, OT, Christine [name] - 04/08/2016 15:17 EDT         Grooming Goal Grid     Goal #1  Goal #2        Activity :    Grooming routine   Grooming routine           Descriptors :    Supported short sit, Unsupported short sit, Wheelchair              Assist :    Contact guard assistance   Setup           Status :    Goal met   Initial           Date Met :    04/08/2016 EDT                BRAATZ, OT, Christine [name] - 04/08/2016 15:17 EDT  BRAATZ, OT, Christine [name] - 04/08/2016 15:17 EDT        Upper Body Dressing Short Term Goal Grid     Goal #1  Goal #2        Activity :    Upper extremity dressing   Upper extremity  dressing           Assist :    Moderate assistance   Minimal assistance           Status :    Goal met   Initial           Date Met :    04/08/2016 EDT              Comment :    Pt rqeuired A to don [facility] dress and and to hook bra.  Mod A overall.                  BRAATZ, OT, Christine [name] - 04/08/2016  15:17 EDT  BRAATZ, OT, Christine [name] - 04/08/2016 15:17 EDT        Lower Body Dressing Grid     Goal #1          Activity :    Lower extremity dressing              Lower Body Dressing Descriptors :    Supine, Supported short sit, Unsupported short sit              Assist :    Maximal assistance              Status :    Progressing, continue              Comment :    Total this date for socks, shoes and underwear                BRAATZ, OT, Christine [name] - 04/08/2016 15:17 EDT         Bathing Goal Grid     Goal #1          Activity :    Bathe              Descriptors :    Supported short sit, Unsupported short sit, Wheelchair              Assist :    Maximal assistance              Status :    Goal met              Date Met :    04/08/2016 EDT              Comment :    Pt required total A this date.  Assist for BUE, peri, buttock and BLE.                  BRAATZ, OT, Christine [name] - 04/08/2016 15:17 EDT         Toileting and Transfers Goal Grid     Goal #1  Goal #2        Activity :    Toilet transfers   Toileting           Assist :    Maximal assistance   Maximal assistance           Equipment :    Bedside commode              Transfer Equipment :    Drop arm commode, Elevated toilet seat, Grab bars, Sliding board   Drop arm commode, Elevated toilet seat, Grab bars, Sliding board           Status :    Progressing, continue   Progressing, continue  BRAATZ, OT, Christine [name] - 04/08/2016 15:17 EDT  BRAATZ, OT, Gardiner Ramus - 04/08/2016 15:17 EDT        OT Balance Goal Grid     Goal #1          Type :    Static sitting              Descriptors :    Unsupported short sit              Assist :    Contact guard assistance              Length of Time (minutes) :    5 minutes              Rationale :    Improve independence with activities of daily living              Status :    Progressing, continue              Comment :    Pt progressing to standing with PT in standing frame.  OT to continue as appropriate.                   Roby Lofts - 04/08/2016 15:17 EDT         OT ST Goals Reviewed :   Verne Spurr, OT, Christine [name] - 04/08/2016 15:17 EDT   Education   Home Caregiver Name/Relationship :   [name] Surgeon-spouse  daughter- beth   Barriers To Learning :   None evident   Teaching Method :   Demonstration, Explanation   BRAATZ, OT, Christine [name] - 04/08/2016 15:17 EDT   Occupational Therapy Education Grid   Activity of Daily Living Training :   Needs further teaching, Needs practice/supervision   Plan of Care :   Needs further teaching, Needs practice/supervision   BRAATZ, OT, Christine [name] - 04/08/2016 15:17 EDT   OT Additional Education :   Pt ed on OT plan of care, role of OT, rehab process.       BRAATZ, OT, Christine [name] - 04/08/2016 15:17 EDT   Assessment   OT Impairments or Limitations :   Balance deficits, Basic activity of daily living deficits, Coordination deficits, Endurance deficits, Equipment training, IADL deficits, Mobility deficits, Proprioception deficits, Safety awareness deficits, Strength deficits   Barriers to Safe Discharge OT :   Complicated medical history, Decreased communication, Limited family support, Limited social support, Medical diagnosis, Progressive nature of disease, Safety awareness, Severity of deficits   OT Discharge Recommendations :   ELOS 3 weeks then home with spouse and power w/c     OT Treatment Recommendations :   Pt seen for AM ADL.  Requested to eat breakfast prior to shower.  Pt hoyered to shower and required increased assist this date for buttocks, peri area, BLE, and UEs.  Pt able to direct care to don bra and OH shirt, however still mod A required.  pt total A for transfers and LB dressing this date.  Pt pleseant and eager to improve.  Pt would benefit from additional skilled OT to address deficits related to balance, BUE strength/ROM which impact ability to perform ADL tasks as independently as possible.       BRAATZ, OT, Christine [name] - 04/08/2016 15:17 EDT   Time Spent With Patient    OT Time In :   7:30 EST   OT Time Out :   9:00 EST  OT ADL TRAINING 15 MIN :   6    OT ADL Training Minutes :   90 minutes   OT Total Individual Therapy Time :   90 minutes   OT Total Timed Code Treatment Units :   6 units   OT Total Timed Code Treatment Minutes :   90 minutes   OT Total Treatment Time Rehab :   90 minutes   BRAATZ, OT, Christine [name] - 04/08/2016 15:17 EDT

## 2016-04-08 NOTE — Progress Notes (Signed)
Functional Indep Measure Scores - Text       Functional Independence Measure Scores Entered On:  04/08/2016 15:16 EDT    Performed On:  04/08/2016 7:30 EDT by Marzella SchleinBRAATZ, OT, ELIZABETH G               Eating Score   Patient's Independence Level With Eating Tasks :   Setup/Supervision   Supervision or Setup Needed :   Cut food, English as a second language teacherGather equipment, Open containers   Functional Independence Measure Eating :   Supervision or setup   BRAATZ, OT, ELIZABETH G - 04/08/2016 15:14 EDT   Grooming Score   Patient's Independence Level with Grooming Tasks :   Setup/Supervision   Supervision or Setup Needed :   English as a second language teacherGather equipment, Setup equipment   Grooming :   Supervision or setup   ChristieBRAATZ, OT, ELIZABETH G - 04/08/2016 15:14 EDT   Bathing Score   Patient's Independence Level with Bathing Tasks :   Assistance   Type of Assistance Necessary :   Assistance with bathing body parts   Body Parts Assessed :   All body surfaces   Tasks Requiring Assistance :   Arm, left, Arm, right, Buttocks, Lower leg and foot, left, Lower leg and foot, right, Perineal area, Upper leg, left, Upper leg, right   Functional Independence Measure Bathing :   Total assistance   BRAATZ, OT, ELIZABETH G - 04/08/2016 15:14 EDT   Upper Body Dressing Score   Patient's Independence Level with Upper Body Dressing Tasks :   Assistance   Type of Assistance Necessary :   Assistance with dressing tasks   Tasks Assessed :   Thread/Unthread right sleeve, Thread/Unthread left sleeve, Pull/Remove head through neckline, Pull/Remove over trunk, Hook/Unhook bra, Thread/Unthread right bra strap, Thread/Unthread left bra strap   Tasks Requiring Assistance :   Pull/Remove head through neckline, Pull/Remove over trunk, Hook/Unhook bra   UE Dressing :   Moderate assistance   BRAATZ, OT, ELIZABETH G - 04/08/2016 15:14 EDT   Lower Body Dressing Score   Patient's independence Level with Lower Body Dressing Tasks :   Assistance   Type of Assistance Necessary :   Assistance with dressing tasks    Tasks Assessed :   Underwear - thread right leg, Underwear - thread left leg, Underwear - pull up/down, Socks - don/doff right sock, Socks - don/doff left sock, Shoes - don/doff right shoe (includes tying), Shoes - don/doff left shoe (includes tying)   Tasks Requiring Assistance :   Underwear - thread right leg, Underwear - thread left leg, Underwear - pull up/down, Socks - don/doff right sock, Socks - don/doff left sock, Shoes - don/doff right shoe (includes tying), Shoes - don/doff left shoe (includes tying)   LE Dressing :   Total assistance   BRAATZ, OT, ELIZABETH G - 04/08/2016 15:14 EDT   Toileting Score   Patient's Independence Level with Toileting Tasks :   Assistance   Type of Assistance Necessary :   Assistance with toileting tasks   Amount of Assistance Needed :   Adjusting clothing before toileting, Adjusting clothing after toileting, Cleansing perineal area   Toileting :   Total assistance   Roby LoftsBRAATZ, OT, ELIZABETH G - 04/08/2016 15:14 EDT   Transfer Shower Score   Patient's independence Level with Transfer Shower Tasks :   Assistance   Type of Assistance Necessary :   Mechanical lift required   Shower Transfer :   Total assistance   SwedelandBRAATZ, [id number], ELIZABETH G - 04/08/2016 15:14  EDT   Comprehension Score   Mode of Comprehension :   Auditory   Comprehends Complex or Abstract Information Without Prompting or Cueing :   Yes   Understands Complex or Abstract Directions and Conversations :   At all times   Comprehension Indep Measure Interim :   Complete independence   BRAATZ, OT, ELIZABETH G - 04/08/2016 15:14 EDT   Expression Score   Expression Mode :   Vocal   Expresses Complex or Abstract Information Without Prompting or Cueing :   Yes   Expresses Complex or Abstract Ideas :   Clearly and fluently at all times   Expression Indep Measure Interim :   Complete independence   Roby Lofts - 04/08/2016 15:14 EDT   Social Interaction Score   Interacts Appropriately Without Supervision :   Yes   Interacts  Appropriately :   At all times   Social Interaction Indep Measure Interim :   Complete independence   BRAATZ, OT, ELIZABETH G - 04/08/2016 15:14 EDT   Problem Solving Score   Solves Complex Problems :   Yes   Ability to Solve Complex Problems :   Consistently solves problems independently   Problem Solving Indep Measure Interim :   Complete independence   BRAATZ, OT, ELIZABETH G - 04/08/2016 15:14 EDT   Memory Score   Recognizes, Remembers Routines, and Executes Requests Without Prompting :   Yes   Remembers and Executes Requests :   Consistently without need for repetition   Memory Indep Measure Interim :   Complete independence   BRAATZ, OT, ELIZABETH G - 04/08/2016 15:14 EDT

## 2016-04-08 NOTE — Progress Notes (Signed)
 Interdisciplinary Team Conference - Text       Interdisciplinary Team Conference PF Entered On:  04/08/2016 9:33 EDT    Performed On:  04/08/2016 9:32 EDT by GREG SYLIVA BROCKS               Team Members   Nurse :   GEORGEANNA, RN, CATHERINE   Occupational Therapist :   LIDDIE GILLIE JOSETTE FORBES   Physical Therapist :   DIMITRI ALMETA EVA LELON   Rehab Physician :   MARZETTE LYNWOOD FORBES DIMITRI, PT, JUSTIN W - 04/08/2016 12:07 EDT   Primary PT :   LORELLA ALMETA, AMBER JONELLE LORELLA, PT, AMBER R - 04/08/2016 11:48 EDT   Primary Care Manager :   GREG SYLIVA BROCKS GREG SYLIVA BROCKS - 04/08/2016 9:32 EDT   Primary Nurse :   Leonore, RN, Sonny Leonore, RN, Sonny - 04/08/2016 10:56 EDT     Primary OT :   JOYA GILLIE, NICOLE A   Primary SLP :   RAYNALDO EARL HERON LITTIE   Care Manager :   GREG SYLIVA BROCKS GREG SYLIVA BROCKS - 04/08/2016 9:32 EDT   Team Conference Date :   04/08/2016 EDT   Leonore OBIE Sonny - 04/08/2016 10:56 EDT     Care Management Summary.   Type of Conference :   Interim   Discharge Plan :   home to childs home with assistance from spouse & [facility] services   Support Network :   family= children/spouse   GREG SYLIVA BROCKS - 04/08/2016 9:32 EDT   Nursing Summary.   Bowel Management :   Incontinent   Bowel Program :   Stool softener   Bowel Movement Last Date :   04/08/2016 EDT   Bladder Management :   Incontinent   Urinary Elimination Management :   Disposable pad   Tracheostomy Information :   No qualifying data available.       Progress Achieved This Week :   no complaints,concerns or significant changes at this time   Nursing Team Notes Current :   Yes   [place], RN, Sonny - 04/08/2016 10:56 EDT   OT Basic ADL   Basic ADL Grid   Eating :   Supervision or setup   Grooming :   Supervision or setup   Bathing :   Moderate assistance   UE Dressing :   Moderate assistance   LE Dressing :   Total assistance   Toileting :   Total assistance   Transfer Toilet :   Total assistance   Tub Transfer :   Does not  occur   Shower Transfer :   Total assistance   [place], OT, ALMARIE MATSU - 04/08/2016 11:11 EDT   ADL Comments :   ADL 04/08/2016 - Hoyered to shower chair.  Pt able to bathe chest, abdomin, UE.  Assist for peri, buttocks, BLE, and UE.  Pt mod A to don bra in shower chair.  Min A to don [facility] dress.  LB dressing total A.         OT ADL Reviewed :   Yes   BRAATZ, OT, ELIZABETH G - 04/08/2016 11:11 EDT   OT Short Term Goals.   Eating Goal Grid     Goal #1          Activity :    Manage food containers/packages  Assist :    Distant supervision              Status :    Progressing, continue                BRAATZ, OT, ELIZABETH G - 04/08/2016 11:11 EDT         Grooming Goal Grid     Goal #1          Activity :    Grooming routine              Descriptors :    Supported short sit, Unsupported short sit, Wheelchair              Assist :    Contact guard assistance              Status :    Goal met              Date Met :    04/08/2016 EDT                BRAATZ, OT, ELIZABETH G - 04/08/2016 11:11 EDT         Upper Body Dressing Short Term Goal Grid     Goal #1  Goal #2        Activity :    Upper extremity dressing   Upper extremity dressing           Assist :    Moderate assistance   Minimal assistance           Status :    Goal met   Initial             BRAATZ, OT, ELIZABETH G - 04/08/2016 11:11 EDT  BRAATZ, OT, ELIZABETH G - 04/08/2016 11:11 EDT        Lower Body Dressing Grid     Goal #1          Activity :    Lower extremity dressing              Lower Body Dressing Descriptors :    Supine, Supported short sit, Unsupported short sit              Assist :    Maximal assistance              Status :    Progressing, continue                BRAATZ, OT, ELIZABETH G - 04/08/2016 11:11 EDT         Bathing Goal Grid     Goal #1          Activity :    Bathe              Descriptors :    Supported short sit, Unsupported short sit, Wheelchair              Assist :    Maximal assistance              Status :    Goal met               Date Met :    04/08/2016 EDT                BRAATZ, OT, ELIZABETH G - 04/08/2016 11:11 EDT         Toileting and Transfers Goal Grid     Goal #1  Goal #2  Activity :    Toilet transfers   Toileting           Assist :    Maximal assistance   Maximal assistance           Equipment :    Bedside commode              Transfer Equipment :    Drop arm commode, Elevated toilet seat, Grab bars, Sliding board   Drop arm commode, Elevated toilet seat, Grab bars, Sliding board           Status :    Progressing, continue   Progressing, continue             BRAATZ, OT, ELIZABETH G - 04/08/2016 11:11 EDT  BRAATZ, OT, ELIZABETH G - 04/08/2016 11:11 EDT        OT Balance Goal Grid     Goal #1          Descriptors :    Unsupported short sit              Type :    Static sitting              Assist :    Contact guard assistance              Length of Time (minutes) :    5 minutes              Rationale :    Improve independence with activities of daily living              Status :    Initial                BRAATZ, OT, ELIZABETH G - 04/08/2016 11:11 EDT         OT STG Reviewed :   Yes   BRAATZ, OT, ELIZABETH G - 04/08/2016 11:11 EDT   OT Long Term Goals.   Patient/Caregiver Goals :   To get stronger, to be independent   BRAATZ, OT, ELIZABETH G - 04/08/2016 11:11 EDT   OT IP Long Term Goals Grid     Long Term Goal 1  Long Term Goal 2        Goal :    Pt will perform simple meal prep activity with min assist to improve indep in ADLs   Pt will improve BUE strength to 3+/5 to improve indep in ADLS           Status :    Initial   Initial             BRAATZ, OT, ELIZABETH G - 04/08/2016 11:11 EDT  BRAATZ, OT, ELIZABETH G - 04/08/2016 11:11 EDT        Eating Goal   Eating Goal :   Complete independence   Grooming Goal :   Modified independence   Bathing Goal :   Minimal contact assistance   Upper Extremity Dressing :   Minimal contact assistance   Lower Body Dressing Goal :   Minimal contact assistance   Toileting Goal :   Minimal contact  assistance   Toilet Transfer Goal :   Minimal contact assistance   BRAATZ, OT, ELIZABETH G - 04/08/2016 11:11 EDT   OT LTG Reconcilation :   Shower goal TBD. Goals may be upgraded pending progress in therapy.   OT Long Term Goals Reviewed :   Yes   BRAATZ, OT, ELIZABETH G - 04/08/2016 11:11 EDT  Occupational Therapy Summary.   Barriers to Safe Discharge OT :   Complicated medical history, Decreased communication, Limited family support, Limited social support, Medical diagnosis, Progressive nature of disease, Safety awareness, Severity of deficits   Additional Comments DME OT :   Progressing   OT Team Notes Current :   Yes   BRAATZ, OT, ELIZABETH G - 04/08/2016 11:11 EDT   PT Mobility   Mobility Grid   Roll Left :   Rehab Maximal assistance   Roll Right :   Rehab Total assistance   Roll Supine :   Rehab Total assistance   Supine to Sit :   Rehab Maximal assistance   Sit to Supine :   Rehab Total assistance   Scooting :   Rehab Total assistance   Transfer Bed to and From Chair :   Rehab Total assistance   Transfer Toilet :   Total assistance   [place], PT, AMBER R - 04/08/2016 11:48 EDT   Amb Ability Varied Surf/Distraction Grid   Level Surfaces :   Does not occur   Uneven Surfaces :   Does not occur   Distracting Environments :   Does not occur   Curbs :   Does not occur   Stairs :   Does not occur   Ramp :   Does not occur   [place], PT, AMBER R - 04/08/2016 11:48 EDT   Transfer Type :   Transfer board   PT Mobility Reviewed :   Yes   [place], PT, AMBER R - 04/08/2016 11:48 EDT   PT WC Management   Type of Wheelchair :   Manual wheelchair   [place], PT, AMBER R - 04/08/2016 11:48 EDT   Wheelchair Mobility Grid   Level Surfaces :   Distant supervision   [place], PT, AMBER R - 04/08/2016 11:48 EDT   Wheelchair Mobility Level Distance :   200 ft   [place], PT, AMBER R - 04/08/2016 11:48 EDT   Pressure Relief Grid     Wheelchair Pressure Relief Trial 1          Technique :    Tilt in space, power              Assist :    Setup               Duration (Seconds) :    120 seconds                [place], PT, AMBER R - 04/08/2016 11:48 EDT         Wheelchair Mobility Reviewed :   Chaney MULBERRY, PT, AMBER R - 04/08/2016 11:48 EDT   PT Short Term Goals.   Bed Mobility Goal Grid     Goal #1          Descriptors :    Roll to right and left              Level :    Moderate assistance              Status :    Progressing, continue                [place], PT, AMBER R - 04/08/2016 11:48 EDT         Transfers Goal Grid     Goal #1          Descriptors :    Fish farm manager  Level :    Moderate assistance              Status :    Progressing, continue                [place], PT, AMBER R - 04/08/2016 11:48 EDT         W/C Management Grid     Goal #1  Goal #2  Goal #3  Goal #4    Descriptors :    Power wheelchair mobility indoors   Pressure relief tilt in space (power wheelchair)   Power wheelchair mobility indoors   Pressure relief tilt in space (power wheelchair)     Level :    Minimal assistance   Minimal assistance   Setup, Distant supervision   Modified independence     Status :    Goal met   Goal met   Initial   Initial     Date Met :    04/06/2016 EDT   04/06/2016 EDT             [place], PT, AMBER R - 04/08/2016 11:48 EDT  [place], PT, AMBER R - 04/08/2016 11:48 EDT  [place], PT, AMBER R - 04/08/2016 11:48 EDT  [place], PT, AMBER R - 04/08/2016 11:48 EDT      PT Balance Goal Grid     Goal #1          Descriptor :    Unsupported short sit              Assist Level :    Moderate assistance              Type :    Static sitting              Length of Time (minutes) :    2 minutes              Rationale :    Improve independence with activities of daily living              Status :    Progressing, continue                [place], PT, AMBER R - 04/08/2016 11:48 EDT         PT STG Reviewed :   Yes   [place], PT, AMBER R - 04/08/2016 11:48 EDT   PT Long Term Goals.   PT Patient,Caregiver Goal :   To get back to being independent   [place], PT, AMBER R - 04/08/2016 11:48 EDT    Outpatient PT Long Term Goals Rehab     Long Term Goal 1          Goal :    Pt will complete car transfer with mod A in 3 weeks              Status :    Initial                [place], PT, AMBER R - 04/08/2016 11:48 EDT         Mobility Goals Grid   Bed, Chair, Wheelchair Goal :   Minimal contact assistance   Toilet Transfer Goal :   Minimal contact assistance   Wheelchair Mobility Level Surfaces Goal :   Modified independence   [place], PT, AMBER R - 04/08/2016 11:48 EDT   PT LTG Reconcilation :   LTG to be met in 3 weeks   Type of Wheelchair Goal :  Power wheelchair   Mode of Locomotion Goal :   Wheelchair   [place], [place], [employer] R - 04/08/2016 11:48 EDT   Physical Therapy Summary.   Additional Comments DME PT :   Progressing with improved sitting balance and ability to assit with SB transfers. Will likely need some assist at home upon DC, is she going to daughers or back to [facility]? Will need new seating components ordered for PWC prior to DC.   PT Team Notes Current :   Yes   [place], PT, AMBER R - 04/08/2016 11:48 EDT   Severity Level.   Comprehension Indep Measure Interim :   Complete independence   Comprehension Mode :   Auditory   Expression Indep Measure Interim :   Complete independence   Expression Mode :   Vocal   Problem Solving Indep Measure Interim :   Complete independence   Memory Indep Measure Interim :   Complete independence   Social Interaction Indep Measure Interim :   Complete independence   DARRIN, OT, ELIZABETH G - 04/08/2016 11:11 EDT   SLP Short Term Goals.   Other SLP Short Term Goal Grid     Goal #1          Other :    Cognitive assessment completed.              Status :    Goal met              Date Met :    04/01/2016 EDT              Comment :    SLP signing off.                DIMITRI, PT, JUSTIN W - 04/08/2016 12:07 EDT         SLP STG Reviewed BETHA Chaney DIMITRI, PT, EVA ORN - 04/08/2016 12:07 EDT   Speech Therapy Summary.   Barriers to Safe Discharge SLP :   Progressive nature of disease    SLP Progress Note Current :   N/A   DIMITRI ALMETA EVA ORN - 04/08/2016 12:07 EDT   Psychology Summary.   Psychology Progress Notes Current :   N/A   WHITESIDES, PT, JUSTIN W - 04/08/2016 12:07 EDT   Interdisciplinary Discharge Planning.   Team Conference Discussion RTF :   EVA DIMITRI, PT, serving as scribe for team.    Incontinent of bowel/bladder. Pt will be staying at her dtrs following D/C.     Discharge Disposition Plan :   home with spouse and dtr who lives locally; plans to D/C to dtr's home following hospital D/C.   DIMITRI, PT, JUSTIN W - 04/08/2016 12:07 EDT     Rehab Anticipated Discharge Date :   04/17/2016 EDT   Next Level of Care :   Home Health, Outpatient   Follow-Up Plans :   OT, Physical Therapy: To Evaulate, Treat and Manage Care of Related Diagnosis   Barriers to goals/Reasons for skilled intervention :   Bladder/bowel issues, Decreased endurance, Decreased sitting/standing balance, Decreased strength/ROM, Skin integrity issues   Medical/rehab reason for continued stay :   Anticoagulation, Bowel/bladder, Electrolyte imbalance, Home mobility issue, Infection, Medication adjustments, Paresis, Pending labs/diagnostics, Skin integrity   WHITESIDES, PT, JUSTIN W - 04/08/2016 12:06 EDT   Anticipated Therapy Interventions.   PT Estimated Hours per Week :   7.5 hours per week   WHITESIDES, PT, JUSTIN W - 04/26/2016 16:16 EDT  Therapy Activity Tolerance :   3 hours over 5 days   WHITESIDES, PT, JUSTIN W - 04/08/2016 12:06 EDT   OT Estimated Hours Per Week :   7.5 hours per week       WHITESIDES, PT, JUSTIN W - 04/26/2016 16:16 EDT     Rehab Team Goals   Team Mobility Goals Grid     Goal #1          Team Mobility Goals :    Toilet transfers              Team Mobility Assist Level :    With max assist              Team Mobility Goal Status :    Ongoing DIMITRI, PT, JUSTIN W - 04/08/2016 12:07 EDT                WHITESIDES, PT, JUSTIN W - 04/08/2016 12:06 EDT         Team Mobility Goals 2 Grid      Goal #1          Team Mobility Goals :    Bed and or wheelchair transfer              Team Mobility Assist Level :    With mod assist              Team Mobility Goal Status :    Ongoing WHITESIDES, PT, JUSTIN W - 04/08/2016 12:07 EDT                DIMITRI, PT, EVA ORN - 04/08/2016 12:06 EDT         Team Patient/Family Ed Goal Grid     Goal #1          Team Patient/Family Education Goals :    Demonstrates compliance with precautions, Demonstrates knowledge of diagnosis, Participate in family teaching/education              Team Patient/Family Edu Goal Status :    Ongoing DIMITRI, PT, JUSTIN W - 04/08/2016 12:07 EDT                WHITESIDES, PT, JUSTIN W - 04/08/2016 12:06 EDT         Team Skin Surveillance/Wound Goals Grid     Goal #1          Team Skin Surveillance/Wound Care Goals :    Skin surveillance              Skin Surveillance Wound Care Goal Status :    Ongoing WHITESIDES, PT, JUSTIN W - 04/08/2016 12:07 EDT                WHITESIDES, PT, JUSTIN W - 04/08/2016 12:06 EDT         Team Goal Timed Voiding :   Q4 hours   WHITESIDES, PT, JUSTIN W - 04/08/2016 12:06 EDT

## 2016-04-08 NOTE — Progress Notes (Signed)
Functional Indep Measure Scores - Text       Functional Independence Measure Scores Entered On:  04/08/2016 14:12 EDT    Performed On:  04/08/2016 14:11 EDT by Maryann AlarLidstone, RN, Verlon AuLeslie               Eating Score   Patient's Independence Level With Eating Tasks :   Independent/Modified independence   Independent or Modifications Needed :   Complete independence   Functional Independence Measure Eating :   Complete independence   Maryann AlarLidstone, RVerlon Au, Leslie - 04/08/2016 14:11 EDT   Lower Body Dressing Score   Patient's independence Level with Lower Body Dressing Tasks :   Assistance   Type of Assistance Necessary :   Requires assistance of 2 people   LE Dressing :   Total assistance   Riki AltesLidstone, RN, Leslie - 04/08/2016 14:11 EDT   Toileting Score   Patient's Independence Level with Toileting Tasks :   Assistance   Type of Assistance Necessary :   Requires assistance of 2 people   Toileting :   Total assistance   Riki AltesLidstone, RN, Leslie - 04/08/2016 14:11 EDT   Bladder Management Score   Patient's Independence Level with Bladder Management Tasks :   Assistance   Type of Assistance Necessary :   Helper positions and holds bedpan, assists patient to roll on OR off the bedpan, Helper changes linen or clothing/cleans up spill, Helper changes diaper or absorbent pad, Two helpers needed   Functional Independence Measure Bladder Management :   Total assistance   Riki AltesLidstone, RN, Leslie - 04/08/2016 14:11 EDT   Bowel Management Score   Patient's Independence Level with Bowel Management Tasks :   Assistance   Type of Assistance Necessary :   Helper changes linen or clothing/cleans up spill, Helper changes diaper, Two helpers needed   Functional Independence Measure Bowel Management :   Total assistance   Riki AltesLidstone, RN, Leslie - 04/08/2016 14:11 EDT

## 2016-04-08 NOTE — Progress Notes (Signed)
Functional Indep Measure Scores - Text       Functional Independence Measure Scores Entered On:  04/08/2016 22:12 EDT    Performed On:  04/08/2016 22:12 EDT by Laural BenesJOHNSON, RN, AMY L               Eating Score   Patient's Independence Level With Eating Tasks :   Setup/Supervision   Supervision or Setup Needed :   Pour liquid into containers   Functional Independence Measure Eating :   Supervision or setup   [place] OaksJOHNSON, RN, AMY L - 04/08/2016 22:12 EDT   Bladder Management Score   Patient's Independence Level with Bladder Management Tasks :   Assistance   Type of Assistance Necessary :   Helper changes linen or clothing/cleans up spill   Functional Independence Measure Bladder Management :   Total assistance   JOHNSON, RN, AMY L - 04/08/2016 22:12 EDT   Bowel Management Score   Patient's Independence Level with Bowel Management Tasks :   Assistance   Type of Assistance Necessary :   Helper changes linen or clothing/cleans up spill   Functional Independence Measure Bowel Management :   Total assistance   JOHNSON, RN, AMY L - 04/08/2016 22:12 EDT

## 2016-04-09 NOTE — Case Communication (Signed)
 CM Discharge Planning Assessment - Text       CM Discharge Planning Ongoing Assessment Entered On:  04/09/2016 11:26 EDT    Performed On:  04/09/2016 11:22 EDT by GREG LLANOS C               Discharge Needs I   Previously Documented Discharge Needs :   DISCHARGE PLAN/NEEDS:  Anticipated Discharge Date: 04/17/2016 - GREG LLANOS BROCKS - 04/01/16 17:28:00  Discharge To, Anticipated: Home with family support, Home with home health - GREG LLANOS BROCKS - 04/01/16 17:28:00  EQUIPMENT/TREATMENT NEEDS:       Previously Documented Benefits Information :   Performed By: Karen Janann NOVAK  - 03/28/16 11:44:00       Anticipated Discharge Date :   04/17/2016 EDT   Anticipated Discharge Time Slot :   1000-1200   Discharge To :   Home with family support, Home with home health   Home Caregiver Name/Relationship :   Christine [name]  daughter- beth   CM Progress Note :   04/01/2016  SW went to speak with Pt several times this day, but she was always indisposed - SW able to meet 1:1 with spouse.  SW presented the [facility]- Summary.  Pt's spouse does not feel the anticipated D/C date will be met, as far as, Pt making proposed goals.  Spouse would like to see Pt make it to as close to [place] with transfers as possible.  Additionally, Spouse would like to hold off presenting D/C date, due to potential of upsetting Pt.  Pt seems to be struggling with loss of independence and ability.  Please note that Pt has been in/out of hospitals since June.  Next, she has not gotten therapy in the past 3-4 weeks.  Spouse concerned about endurance and muscle loss.  Spouse requested assistance with release of information from Dr. Arlyss in [facility]rolog[place] in [place]  --> send clinical(s) to Dr. Dewane with [facility] (faxed releases).  SW will continue to assist/prn.  vcd     04/08/2016  SW went to relay ITC-Summary to Pt. Pt requested that I return when she has her husbanc.  SW will follow up & continue to assist/prn.     04/09/2016  SW  went to meet with Pt & her spouse.  Family would like to explore sub-acute facilities that have a community with level of services & one that has a golf course close by for spouse.  SW will put together local regressive/progressive communities in the [place].  SW will continue to assist/prn.  vcd       [name],  [name] - 04/09/2016 11:22 EDT

## 2016-04-09 NOTE — Progress Notes (Signed)
PT Inpatient Daily Documentation - Text       PT Inpatient Daily Documentation Entered On:  04/09/2016 10:04 EDT    Performed On:  04/09/2016 9:00 EDT by Eliseo Gum, PT, AMBER R               Reason for Treatment   *Reason for Referral :   Progressive MS (dx 1989)  new weakness following UTI and C-diff    3 hrs/day     *Chief Complaint :   weakness bilat LE's, tightness R ankle/calf     BENTON, PT, AMBER R - 04/09/2016 10:03 EDT   Review/Treatments Provided   PT Therapeutic Exercise :   Yes   BENTON, PT, AMBER R - 04/09/2016 14:17 EDT   PT Goals :   PT Short Term Goals    04/08/2016  Bed Mobility Goal #1: Roll to right and left; Mod A; Progressing, continue  Transfer Goal #1: Transfer board; Mod A; Progressing, continue  Wheelchair Management Goal #3: Power wheelchair mobility indoors; Setup, Distant S; Initial  Wheelchair Management Goal #4: Pressure relief tilt in space (power wheelchair); Mod I; Initial  Balance Goal #1: Unsupported short sit; Mod A; Static sitting; 2; Improve independence with activities of daily living; Progressing, continue     PT Plan :   Treatment Frequency:  Daily (modified)   Performed By: Eliseo Gum, PT, AMBER R  03/30/2016 09:30  Treatment Duration: 3 Performed By: Carney Living, AMBER R  03/30/2016 09:30  Planned Treatments: Balance training, Basic activities of daily living, Bed mobility training, Caregiver training, Electrical stimulation, Equipment training, Functional training, Manual therapy, Neuromuscular reeducation, Pain management, Posture/Body mechanics training,... Performed By: Carney Living, AMBER R  03/30/2016 09:30     Short Term Goals Reviewed :   Yes   Physical Therapy Orders :   Physical Therapy Inpatient Additional Treatment Rehab - 03/30/16 11:39:51 EDT, Balance training, Basic activities of daily living, Bed mobility training, Caregiver training, Electrical stimulation, Equipment training, Functional training, Manual therapy, Neuromuscular reeducation, Pain management,  Posture/...  PT FIMS - 03/29/16 19:38:00 EDT, Daily     Pain Present :   No actual or suspected pain   PT Therapeutic Activity,Mobility,Balance :   Yes   BENTON, PT, AMBER R - 04/09/2016 10:03 EDT   Therapeutic Activities/Mobility/Balance   Reassess Mobility :   Yes   BENTON, PT, AMBER R - 04/09/2016 14:17 EDT   Functional Activity :   Therapeutic Activities  Activity 1:  Tolerance to upright position; Total A; Supported stand; Sales promotion account executive; Demonstrated positive response to treatment, Improved stability in muscle group(s), Improved timing, control, and coordination, Increased activation of targeted muscle group(s); incremental increase into full standing position with no S&S of orthostatic hypotention. Total A to maintain midline trunk position and cervical spine orientation during transistion. Able to maintain neutral cervical position once in standing (cont)       Performed Date:  04/08/2016  Activity 2:  (cont) position with mirror for improved visual feedback. total time in standing frame 60 min.       Performed Date:  04/08/2016  Activity 3:  Transfer training; Total A; Pt completed lateral scoot >L from PWC>bed for toileting w/total A 2/2 time restrictions at end of session. RN present w/pt on bed pan.       Performed Date:  04/07/2016  Activity 4:  Transitional movement; lateral propped on L elbow to encourage appropriate trunk righting  and improve trunk ROM to L sidebending. Balance on L propped elbow with  close SPV and VC for use of head/shoulders to control balance position.       Performed Date:  04/02/2016     Eliseo Gum PT, AMBER R - 04/09/2016 10:03 EDT   BENTON, PT, AMBER R - 04/09/2016 10:03 EDT   PT Therapeutic Activities Grid     Activity 2  Activity 3  Activity 1      Activity :    Transfer training   Dynamic sitting balance, Static sitting balance   Bed mobility        Assist :    Maximal assistance      Maximal assistance        Response :    Improved stability in muscle group(s), Improved timing,  control, and coordination, Increased activation of targeted muscle group(s)   Improved stability in muscle group(s), Improved timing, control, and coordination, Increased activation of targeted muscle group(s)   Improved stability in muscle group(s), Improved timing, control, and coordination, Increased activation of targeted muscle group(s)        Comment :    SB transfers with max A for ant WS, lateral hip movement and dynamic sitting balance.    static sitting with perterbations in ant/post and lateral directions for improved trunk control with functional mobility and transfers.    max A for B LE and trunk assist using HOB elevation 2* tetraparesis, decr balance and safety          BENTON, PT, AMBER R - 04/09/2016 14:17 EDT  BENTON, PT, AMBER R - 04/09/2016 14:17 EDT  BENTON, PT, AMBER R - 04/09/2016 14:17 EDT       PT Mobility   Mobility Grid   Supine to Sit :   Rehab Maximal assistance   Transfer Bed to and From Chair :   Rehab Maximal assistance   BENTON, PT, AMBER R - 04/09/2016 14:17 EDT   Amb Ability Varied Surf/Distraction Grid   Level Surfaces :   Does not occur   Uneven Surfaces :   Does not occur   Distracting Environments :   Does not occur   Curbs :   Does not occur   Stairs :   Does not occur   Ramp :   Does not occur   BENTON, PT, AMBER R - 04/09/2016 14:17 EDT   Transfer Type :   Transfer board   PT Mobility Reviewed :   Yes   BENTON, PT, AMBER R - 04/09/2016 14:17 EDT   Therapeutic Exercise   Therapeutic Exercise RTF :   Therapeutic Exercise  Exercise 1:  Scapular strengthening; 3 x 10; scap retraction, isometric deep cervical flexors with mirror for improved visual feedback to increase ROM       Performed Date:  04/05/2016  Exercise 2:  Upper extremity stretching; lateral sidebending to R with overpressure to increase ROM to midline       Performed Date:  04/05/2016     BENTON, PT, AMBER R - 04/09/2016 14:17 EDT   Therapeutic Exercise Grid     Exercise 1  Exercise 2        Exercise :    Scapular  strengthening   Upper extremity stretching           Repetition/Time :    3 x 10              Comment :    scap retraction/depression with manual cues to decr overuse of upper trapezius. Mirror for improved visual feedback  B upper trapezius stretching for improrved ROM and seated posture             BENTON, PT, AMBER R - 04/09/2016 14:17 EDT  BENTON, PT, AMBER R - 04/09/2016 14:17 EDT        Short Term Goals   Bed Mobility Goal Grid     Goal #1          Descriptors :    Roll to right and left              Level :    Moderate assistance              Status :    Progressing, continue                BENTON, PT, AMBER R - 04/09/2016 10:03 EDT         Transfers Goal Grid     Goal #1          Descriptors :    Transfer board              Level :    Moderate assistance              Status :    Progressing, continue                BENTON, PT, AMBER R - 04/09/2016 10:03 EDT         W/C Management Grid     Goal #1  Goal #2  Goal #3  Goal #4    Descriptors :    Power wheelchair mobility indoors   Pressure relief tilt in space (power wheelchair)   Power wheelchair mobility indoors   Pressure relief tilt in space (power wheelchair)     Level :    Minimal assistance   Minimal assistance   Setup, Distant supervision   Modified independence     Status :    Goal met   Goal met   Initial   Initial     Date Met :    04/06/2016 EDT   04/06/2016 EDT             BENTON, PT, AMBER R - 04/09/2016 10:03 EDT  BENTON, PT, AMBER R - 04/09/2016 10:03 EDT  BENTON, PT, AMBER R - 04/09/2016 10:03 EDT  BENTON, PT, AMBER R - 04/09/2016 10:03 EDT      PT Balance Goal Grid     Goal #1          Descriptor :    Unsupported short sit              Assist Level :    Moderate assistance              Type :    Static sitting              Length of Time (minutes) :    2 minutes              Rationale :    Improve independence with activities of daily living              Status :    Progressing, continue                BENTON, PT, AMBER R - 04/09/2016 10:03 EDT          PT ST Goals Reviewed :   Silvio Clayman, PT, AMBER R - 04/09/2016 10:03 EDT  Assessment   PT Impairments or Limitations :   Abnormal tone, Balance deficits, Bed mobility deficits, Coordination/Proprioception deficits, Endurance deficits, Equipment training, Impaired sensation, Range of motion deficits, Strength deficits, Transfer deficits, Transition deficits, Wheelchair mobility deficits   Barriers to Safe Discharge PT :   Medical diagnosis   Discharge Recommendations :   ELOS: 3 weeks; home with assist and PWC     PT Treatment Recommendations :   Pt continues to progress towards set PT goals and will cont to benefit from skilled PT to address above impairments and progress indep with functional mobility and transfers.      BENTON, PT, AMBER R - 04/09/2016 14:17 EDT   Time Spent With Patient   PT Time In :   9:00 EST   PT Time Out :   10:00 EST   PT ADL TRAINING 15 MN :   4 units   PT ADL Training Time :   60 minutes   PT Total Individual Therapy Time :   60 minutes   PT Total Timed Code Treatment Units :   4 units   PT Total Timed Code Tx Minutes :   60 minutes   PT Total Treatment Time Rehab :   60 minutes   BENTON, PT, AMBER R - 04/09/2016 14:17 EDT   PT Units Cancelled Missed     PT Units Lost #1          Amount :    2               Reason :    Other: nsg/pt not ready                BENTON, PT, AMBER R - 04/09/2016 14:17 EDT         PT Minutes Grid     PT Minutes Lost #1          Amount :    30               Reason :    Other: nsg/pt not ready                BENTON, PT, AMBER R - 04/09/2016 14:17 EDT

## 2016-04-09 NOTE — Nursing Note (Signed)
Medication Administration Follow Up-Text       Medication Administration Follow Up Entered On:  04/09/2016 1:27 EDT    Performed On:  04/08/2016 23:03 EDT by Laural BenesJOHNSON, RN, AMY L      Intervention Information:     tramadol  Performed by Laural BenesJOHNSON, RN, AMY L on 04/08/2016 22:03:00 EDT       tramadol,25mg   Oral,mild pain (1-3) or temp > 100.5 F       Medication Effectiveness Evaluation   Medication Administration Reason :   Pain   Medication Effective :   Yes   Medication Response :   Symptoms improved   JOHNSON, RN, AMY L - 04/09/2016 1:27 EDT

## 2016-04-09 NOTE — Progress Notes (Signed)
OT Inpatient Daily Documentation - Text       OT Inpatient Daily Documentation Entered On:  04/09/2016 15:49 EDT    Performed On:  04/09/2016 10:30 EDT by Marzella Schlein, OT, ELIZABETH G               Reason for Treatment   *Reason for Referral :   Admitted for MS exacerbation    82 yo female with past medical history of progressive MS diagnosed in 1989, CVA with right sided weakness, hypothyroidism and neurogenic bladder admitted to [facility] in [place], [place] on 03/06/16 with recurrent generalized weakness of the upper and lower extremities and poor posture. She has been wheelchair bound for the past few years due to right-sided weakness.    (per pt, she did not have a CVA)     *Chief Complaint :   weakness     BRAATZ, OT, ELIZABETH G - 04/09/2016 15:37 EDT   Review/Treatments Provided   OT Goals :   OT Short Term Goals    04/08/2016  Eating Goal #1: Manage food containers/packages; Distant S; Progressing, continue  Upper Body Dressing Goal #2: Upper extremity dressing; Minimal assistance; Initial  Lower Body Dressing Goal #1: Lower extremity dressing; Supine, Supported short sit, Unsupported short sit; Max A; Progressing, continue  Toileting and Transfers Goal #1: Toilet transfers; Max A; Bedside commode; Drop arm commode, Elevated toilet seat, Grab bars, Sliding board; Progressing, continue  Toileting and Transfers Goal #2: Toileting; Max A; Drop arm commode, Elevated toilet seat, Grab bars, Sliding board; Progressing, continue  Balance Goal #1: Unsupported short sit; Static sitting; CGA; 5; Improve independence with activities of daily living; Initial     OT Plan :   Treatment Frequency: Daily Performed By: Carilyn Goodpasture M   03/30/2016  Treatment Duration: 3 Performed By: Katherine Roan   03/30/2016  Planned Treatments: Balance training, Basic Activities of Daily Living, Caregiver training, Coordination, Energy conservation training, Equipment training, Group therapy, Mobility training, Patient education,  Safety education, Therapeutic activities, Therapeutic exercises... Performed By: Katherine Roan   03/30/2016     Occupational Therapy Orders :   Occupational Therapy Inpatient Additional Treatment Rehab - 03/30/16 16:40:41 EDT, Balance training, Basic Activities of Daily Living, Caregiver training, Coordination, Energy conservation training, Equipment training, Group therapy, Mobility training, Patient education, Safety education, Therapeutic activitie...  OT FIMS - 03/29/16 19:38:00 EDT, Daily     Pain Present :   No actual or suspected pain   OT Therapeutic Activity,Mobility,Balance :   Yes   OT Manual Therapy Provided :   Yes   BRAATZ, OT, ELIZABETH G - 04/09/2016 15:37 EDT   Therapeutic Activities   OT Therapeutic Activities RTF :   Therapeutic Activities  Activity 1:  Dressing activity; Total A; Pt rolling to R and L sides c use of bed rail and therapist pull briefs up over hips and buttocks 2* decr strength in BUEs and BLEs.       Performed Date:  04/09/2016  Activity 2:  Transitional movement; Total A; Hoyer lift; Hoyer lift utilized for t/f OOB into w/c 2* decr overall strength/endurance and standing balance.       Performed Date:  04/09/2016  Activity 3:  Transitional movement; Practiced sit to stand x1; blocked knees, pt put UEs around therapist and on 3 came to upright position with max assist but pt able to tolerate position once upright for 5 seconds. Pt pleased to try.  Performed Date:  04/07/2016  Activity 4:  Grasp activities, Reaching activities; Utilized cones for reaching with both hands across midline and then placing to same side of UE being used - grasp and release of cones.  Right side more difficult to release objects.       Performed Date:  04/07/2016     Roby Lofts - 04/09/2016 15:37 EDT   OT Therapeutic Activities Grid     Activity 1  Activity 2  Activity 3  Activity 4    Activities :    Weight shift laterally   Weight shift anteriorly   Transitional movement   Other:  Dynamic sitting balance     Assist :    Moderate assistance   Minimal assistance, Moderate assistance   Moderate assistance   Minimal assistance     Position :    Supported short sit, Other: Lateral lean onto R elbow   Unsupported short sit   Sitting in wheelchair, Supported short sit   Supported short sit, Unsupported short sit     Equipment :    Other: bean bags         Other: Ball/bean bags     Response :    Improved stability in muscle group(s), Improved timing, control, and coordination, Increased activation of targeted muscle group(s), Integrated muscular activation into functional activity   Improved stability in muscle group(s), Improved timing, control, and coordination, Increased activation of targeted muscle group(s), Integrated muscular activation into functional activity   Improved stability in muscle group(s), Improved timing, control, and coordination, Increased activation of targeted muscle group(s), Integrated muscular activation into functional activity   Improved stability in muscle group(s), Improved timing, control, and coordination, Increased activation of targeted muscle group(s), Integrated muscular activation into functional activity     Comments :    Pt performed lateral weight shift onto R elbow to increase L trunk extension while reaching with LUE arcoss midline to reach bean bag 5x4.  Pt required increased assist as task progressed.     Pt performed anterior lean x6 trails requiring mod A for 1 trial, supv for remaining 5x.     pt performed lat scoot transfer with gait belt around hips and sliding board from w/c <> EOM x2.  Pt's husband performed trasnfers 1x with CGA from OT after ed.     Pt performed anterior reaching to place bean bags into container on table top with occassional assist to maintain balance  when reachign with RUE.  Task to increase dynamic sitting balance needed for greater I with transfers and ADL performance.         Marzella Schlein, OT, ELIZABETH G - 04/09/2016 15:37 EDT   Flora Lipps G - 04/09/2016 15:37 EDT  Marzella Schlein, OT, Gardiner Ramus - 04/09/2016 15:37 EDT  Marzella Schlein, OT, Gardiner Ramus - 04/09/2016 15:37 EDT        Activity 5          Activities :                  Assist :                  Position :                  Equipment :                  Response :    Improved stability in muscle group(s), Improved timing, control, and coordination, Increased activation of  targeted muscle group(s), Integrated muscular activation into functional activity              Comments :                    BRAATZ, OT, ELIZABETH G - 04/09/2016 15:37 EDT         Manual Therapy/Massage   Manual Therapy/Massage Grid     Activity 1          Type :    Manual stretch, Mobilization, Muscle energy technique              Region :    Left scapula, Right scapula              Technique :    Trigger point release              Rationale :    Increase range of motion              Response :    Improved stability in muscle group(s), Improved timing, control, and coordination, Increased activation of targeted muscle group(s), Integrated muscular activation into functional activity              Comments  (Comment: Pt recieved soft tissue massage to B upper trap and mid/lower traps to improve scap depression to improve upright posture.  Pt able to mobilize L scap after facilitation, R scap with less mobility this date.   Flora Lipps G - 04/09/2016 15:37 EDT] )         Roby Lofts - 04/09/2016 15:37 EDT         Education   Responsible Learner Present for Session :   Yes   Home Caregiver Name/Relationship :   Toy Cookey  daughter- beth   Barriers To Learning :   None evident   Teaching Method :   Demonstration, Explanation   BRAATZ, OT, ELIZABETH G - 04/09/2016 15:37 EDT   Occupational Therapy Education Grid   Bed to Chair Transfers :   Needs further teaching, Needs practice/supervision   Plan of Care :   Needs further teaching, Needs practice/supervision   BRAATZ, OT, ELIZABETH G - 04/09/2016  15:37 EDT   OT Additional Education :   Pt and husband ed on transfer techniques, safe transfers with hoyer, importance of depressing scap to improve posture.       BRAATZ, OT, ELIZABETH G - 04/09/2016 15:37 EDT   Assessment   OT Impairments or Limitations :   Balance deficits, Basic activity of daily living deficits, Coordination deficits, Endurance deficits, Equipment training, IADL deficits, Mobility deficits, Proprioception deficits, Safety awareness deficits, Strength deficits   Barriers to Safe Discharge OT :   Complicated medical history, Decreased communication, Limited family support, Limited social support, Medical diagnosis, Progressive nature of disease, Safety awareness, Severity of deficits   OT Discharge Recommendations :   ELOS 3 weeks then home with spouse and power w/c     OT Treatment Recommendations :   Pt seen to address balance, posture and strength as well as family transfer training to improve pt's I with transfers and ADL tasks.  Pt initially required min A to maintain balance while seated EOM, however as sessions progressed, pt required supv vs min A to maintain balance.  Pt did fatigue during therapy tasks, however continued to participate fully with rest breaks.  pt demo'd improved balance in PM session, and demo'd ability to perform anterior leans needed for greater  I with transfers.  Pt also demo'd ability to perform scap depression to improve upright posture and decrease anterior flexion of cervical and thoracic spine.  Pt would benefit from addtiional skilled OT to address deficits related to balance, strength and ADL retraining to maximize safety and I upon d.c       BRAATZ, OT, ELIZABETH G - 04/09/2016 15:37 EDT   Time Spent With Patient   OT Time In :   10:30 EST   OT Time Out :   11:30 EST   OT Time In 2 :   13:30 EST   OT Time Out 2 :   14:00 EST   OT Neuromuscular Reeducation Units :   3 units   OT Neuromuscular Reeducation Time :   45 minutes   OT Manual Therapy Units :   1 units    OT Manual Therapy Treatment Time :   15 minutes   OT Functional Activities Minutes :   30 minutes   OT FUNCTIONAL TRNG 15 MIN :   2    OT Total Individual Therapy Time :   90 minutes   OT Total Timed Code Treatment Units :   6 units   OT Total Timed Code Treatment Minutes :   90 minutes   OT Total Treatment Time Rehab :   90 minutes   BRAATZ, OT, ELIZABETH G - 04/09/2016 15:37 EDT

## 2016-04-09 NOTE — Progress Notes (Signed)
OT Inpatient Daily Documentation - Text       OT Inpatient Daily Documentation Entered On:  04/09/2016 14:54 EDT    Performed On:  04/09/2016 14:51 EDT by Margarite GougeMARSHALL, COTA, RACHEL C               Reason for Treatment   Subjective Statement :   Pt agreeable to therapy.     *Reason for Referral :   Admitted for MS exacerbation    82 yo female with past medical history of progressive MS diagnosed in 1989, CVA with right sided weakness, hypothyroidism and neurogenic bladder admitted [facility] [place] on 03/06/16 with recurrent generalized weakness of the upper and lower extremities and poor posture. She has been wheelchair bound for the past few years due to right-sided weakness.    (per pt, she did not have a CVA)     *Chief Complaint :   weakness     MARSHALL, COTA, RACHEL C - 04/09/2016 14:51 EDT   Review/Treatments Provided   OT Goals :   OT Short Term Goals    04/08/2016  Eating Goal #1: Manage food containers/packages; Distant S; Progressing, continue  Upper Body Dressing Goal #2: Upper extremity dressing; Minimal assistance; Initial  Lower Body Dressing Goal #1: Lower extremity dressing; Supine, Supported short sit, Unsupported short sit; Max A; Progressing, continue  Toileting and Transfers Goal #1: Toilet transfers; Max A; Bedside commode; Drop arm commode, Elevated toilet seat, Grab bars, Sliding board; Progressing, continue  Toileting and Transfers Goal #2: Toileting; Max A; Drop arm commode, Elevated toilet seat, Grab bars, Sliding board; Progressing, continue  Balance Goal #1: Unsupported short sit; Static sitting; CGA; 5; Improve independence with activities of daily living; Initial     OT Plan :   Treatment Frequency: Daily Performed By: Carilyn GoodpastureHOBBY, OT, LESLIE M   03/30/2016  Treatment Duration: 3 Performed By: Katherine RoanHOBBY, OT, LESLIE M   03/30/2016  Planned Treatments: Balance training, Basic Activities of Daily Living, Caregiver training, Coordination, Energy conservation training, Equipment training,  Group therapy, Mobility training, Patient education, Safety education, Therapeutic activities, Therapeutic exercises... Performed By: Katherine RoanHOBBY, OT, LESLIE M   03/30/2016     Occupational Therapy Orders :   Occupational Therapy Inpatient Additional Treatment Rehab - 03/30/16 16:40:41 EDT, Balance training, Basic Activities of Daily Living, Caregiver training, Coordination, Energy conservation training, Equipment training, Group therapy, Mobility training, Patient education, Safety education, Therapeutic activitie...  OT FIMS - 03/29/16 19:38:00 EDT, Daily     Pain Present :   No actual or suspected pain   OT Therapeutic Activity,Mobility,Balance :   Yes   MARSHALL, COTA, RACHEL C - 04/09/2016 14:51 EDT   Therapeutic Activities   OT Therapeutic Activities RTF :   Therapeutic Activities  Activity 1:  Transitional movement; Sitting in w/c performed shift to left side on mat to wt bear on forearm and then return to sitting upright x5. Performed leaning forward flexing at hips and returning to upright using UEs and core strength to return to upright x 5.       Performed Date:  04/07/2016  Activity 2:  Grasp activities; Sitting in wheelchair; Utilized washcloth in water for patient to work on increasing grip strength and stabilizing force by wringing out cloth x 10.       Performed Date:  04/07/2016  Activity 3:  Transitional movement; Practiced sit to stand x1; blocked knees, pt put UEs around therapist and on 3 came to upright position with max assist  but pt able to tolerate position once upright for 5 seconds. Pt pleased to try.       Performed Date:  04/07/2016  Activity 4:  Grasp activities, Reaching activities; Utilized cones for reaching with both hands across midline and then placing to same side of UE being used - grasp and release of cones.  Right side more difficult to release objects.       Performed Date:  04/07/2016     Carolin Coy - 04/09/2016 14:51 EDT   OT Therapeutic Activities Grid     Activity 1   Activity 2        Activities :    Dressing activity   Transitional movement           Assist :    Total assistance   Total assistance           Equipment :       Hoyer lift           Comments :    Pt rolling to R and L sides c use of bed rail and therapist pull briefs up over hips and buttocks 2* decr strength in BUEs and BLEs.    Hoyer lift utilized for t/f OOB into w/c 2* decr overall strength/endurance and standing balance.             Dionne Milo C - 04/09/2016 14:51 EDT  Carolin Coy - 04/09/2016 14:51 EDT        Education   Home Caregiver Name/Relationship :   [name] Aden-spouse  daughter- beth   Carolin Coy - 04/09/2016 14:51 EDT   Occupational Therapy Education Grid   Bed Positioning :   [employer] understanding, Demonstrates, Needs practice/supervision   Bed to Chair Transfers :   [employer] understanding, Paediatric nurse, Needs practice/supervision   Home Safety :   TEFL teacher understanding, Demonstrates   Plan of Care :   [employer] understanding, Demonstrates   MARSHALL, COTA, RACHEL C - 04/09/2016 14:51 EDT   Assessment   OT Impairments or Limitations :   Balance deficits, Basic activity of daily living deficits, Coordination deficits, Endurance deficits, Equipment training, IADL deficits, Mobility deficits, Proprioception deficits, Safety awareness deficits, Strength deficits   Barriers to Safe Discharge OT :   Complicated medical history, Decreased communication, Limited family support, Limited social support, Medical diagnosis, Progressive nature of disease, Safety awareness, Severity of deficits   OT Discharge Recommendations :   ELOS 3 weeks then home with spouse and power w/c     OT Treatment Recommendations :   Pt tolerated tx well today. Pt left seated in w/c c seat belt fastened and chair alarm activated c all needs in reach.     MARSHALL, COTA, RACHEL C - 04/09/2016 14:51 EDT   Time Spent With Patient   OT Time In :   13:00 EST   OT Time Out :   13:30 EST   OT  ADL TRAINING 15 MIN :   2    OT ADL Training Minutes :   30 minutes   OT Total Individual Therapy Time :   30 minutes   OT Total Timed Code Treatment Units :   2 units   OT Total Timed Code Treatment Minutes :   30 minutes   OT Total Treatment Time Rehab :   30 minutes   MARSHALL, COTA, RACHEL C - 04/09/2016 14:51 EDT

## 2016-04-09 NOTE — Progress Notes (Signed)
Functional Indep Measure Scores - Text       Functional Independence Measure Scores Entered On:  04/09/2016 14:24 EDT    Performed On:  04/09/2016 9:00 EDT by Eliseo GumBENTON, PT, AMBER R               Transfer Bed/Chair/WC Score   Patient's independence Level with Bed, Chair, Wheelchair Tasks :   Assistance   Type of Assistance Necessary :   Assistance for more than half (patient performs 25% - 49% of tasks)   Bed, Chair, Wheelchair Transfer :   Maximal assistance   [place], PT, AMBER R - 04/09/2016 14:24 EDT

## 2016-04-09 NOTE — Progress Notes (Signed)
Functional Indep Measure Scores - Text       Functional Independence Measure Scores Entered On:  04/09/2016 10:51 EDT    Performed On:  04/09/2016 10:49 EDT by Mitchum, RN, Christina L               Eating Score   Patient's Independence Level With Eating Tasks :   Independent/Modified independence   Independent or Modifications Needed :   Complete independence   Functional Independence Measure Eating :   Complete independence   Mitchum, RN, Elvin SoChristina L - 04/09/2016 10:49 EDT   Toileting Score   Patient's Independence Level with Toileting Tasks :   Does not occur   Toileting :   Does not occur   Mitchum, RN, Christina L - 04/09/2016 10:49 EDT   Bladder Management Score   Patient's Independence Level with Bladder Management Tasks :   Assistance   Type of Assistance Necessary :   Helper changes linen or clothing/cleans up spill, Helper changes diaper or absorbent pad   Functional Independence Measure Bladder Management :   Total assistance   Mitchum, RN, Elvin SoChristina L - 04/09/2016 10:49 EDT   Bowel Management Score   Patient's Independence Level with Bowel Management Tasks :   Assistance   Type of Assistance Necessary :   Helper changes linen or clothing/cleans up spill, Helper changes diaper   Functional Independence Measure Bowel Management :   Total assistance   Mitchum, RN, Elvin SoChristina L - 04/09/2016 10:49 EDT   Transfer Bed/Chair/WC Score   Patient's independence Level with Bed, Chair, Wheelchair Tasks :   Assistance   Type of Assistance Necessary :   Two helpers needed, Mechanical lift required, Total assist (patient performs less than 25%)   Bed, Chair, Wheelchair Transfer :   Total assistance   Mitchum, RN, Elvin SoChristina L - 04/09/2016 10:49 EDT   Transfer Toilet Score   Patient's Independence Level with Transfer Toilet Tasks :   Does not occur   Transfer Toilet :   Does not occur   Mitchum, RN, Christina L - 04/09/2016 10:49 EDT

## 2016-04-09 NOTE — Progress Notes (Signed)
Functional Indep Measure Scores - Text       Functional Independence Measure Scores Entered On:  04/09/2016 20:25 EDT    Performed On:  04/09/2016 20:25 EDT by Laural BenesJOHNSON, RN, AMY L               Eating Score   Patient's Independence Level With Eating Tasks :   Setup/Supervision   Supervision or Setup Needed :   Open containers   Functional Independence Measure Eating :   Supervision or setup   Laural BenesJOHNSON, RN, AMY L - 04/09/2016 20:25 EDT   Bladder Management Score   Patient's Independence Level with Bladder Management Tasks :   Assistance   Type of Assistance Necessary :   Helper changes linen or clothing/cleans up spill   Functional Independence Measure Bladder Management :   Total assistance   Laural BenesJOHNSON, RN, AMY L - 04/09/2016 20:25 EDT   Bowel Management Score   Patient's Independence Level with Bowel Management Tasks :   Assistance   Type of Assistance Necessary :   Helper changes linen or clothing/cleans up spill   Functional Independence Measure Bowel Management :   Total assistance   Laural BenesJOHNSON, RN, AMY L - 04/09/2016 20:25 EDT

## 2016-04-09 NOTE — Progress Notes (Signed)
Functional Indep Measure Scores - Text       Functional Independence Measure Scores Entered On:  04/09/2016 14:51 EDT    Performed On:  04/09/2016 14:50 EDT by Margarite GougeMARSHALL, COTA, RACHEL C               Lower Body Dressing Score   Patient's independence Level with Lower Body Dressing Tasks :   Assistance   Type of Assistance Necessary :   Assistance with dressing tasks   Tasks Assessed :   Underwear - pull up/down   Tasks Requiring Assistance :   Underwear - pull up/down   LE Dressing :   Total assistance   MARSHALL, Erby PianCOTA, RACHEL C - 04/09/2016 14:50 EDT   Transfer Bed/Chair/WC Score   Patient's independence Level with Bed, Chair, Wheelchair Tasks :   Assistance   Type of Assistance Necessary :   Mechanical lift required   Bed, Chair, Wheelchair Transfer :   Total assistance   MARSHALL, Erby PianCOTA, RACHEL C - 04/09/2016 14:50 EDT   Comprehension Score   Mode of Comprehension :   Auditory   Comprehends Complex or Abstract Information Without Prompting or Cueing :   Yes   Understands Complex or Abstract Directions and Conversations :   At all times   Comprehension Indep Measure Interim :   Complete independence   MARSHALL, COTA, RACHEL C - 04/09/2016 14:50 EDT   Expression Score   Expression Mode :   Vocal   Expresses Complex or Abstract Information Without Prompting or Cueing :   Yes   Expresses Complex or Abstract Ideas :   Clearly and fluently at all times   Expression Indep Measure Interim :   Complete independence   Dionne MiloMARSHALL, COTA, RACHEL C - 04/09/2016 14:50 EDT   Social Interaction Score   Interacts Appropriately Without Supervision :   Yes   Interacts Appropriately :   At all times   Social Interaction Indep Measure Interim :   Complete independence   MARSHALL, COTA, RACHEL C - 04/09/2016 14:50 EDT   Problem Solving Score   Solves Complex Problems :   Yes   Ability to Solve Complex Problems :   Consistently solves problems independently   Problem Solving Indep Measure Interim :   Complete independence   MARSHALL, COTA, RACHEL  C - 04/09/2016 14:50 EDT   Memory Score   Recognizes, Remembers Routines, and Executes Requests Without Prompting :   Yes   Remembers and Executes Requests :   Consistently without need for repetition   Memory Indep Measure Interim :   Complete independence   MARSHALL, COTA, RACHEL C - 04/09/2016 14:50 EDT

## 2016-04-10 NOTE — Progress Notes (Signed)
PT Inpatient Daily Documentation - Text       PT Inpatient Daily Documentation Entered On:  04/10/2016 11:38 EDT    Performed On:  04/10/2016 11:32 EDT by Daiva Nakayama, PTA, ANDREA G               Reason for Treatment   Subjective Statement :   Pt cooperative and willing to participate in therapy.     *Reason for Referral :   Progressive MS (dx 1989)  new weakness following UTI and C-diff    3 hrs/day     *Chief Complaint :   weakness bilat LE's, tightness R ankle/calf     WHITESIDES, PTA, ANDREA G - 04/10/2016 11:32 EDT   Review/Treatments Provided   PT Goals :   PT Short Term Goals    04/09/2016  Bed Mobility Goal #1: Roll to right and left; Mod A; Progressing, continue  Transfer Goal #1: Transfer board; Mod A; Progressing, continue  Wheelchair Management Goal #3: Power wheelchair mobility indoors; Setup, Distant S; Initial  Wheelchair Management Goal #4: Pressure relief tilt in space (power wheelchair); Mod I; Initial  Balance Goal #1: Unsupported short sit; Mod A; Static sitting; 2; Improve independence with activities of daily living; Progressing, continue     PT Plan :   Treatment Frequency:  Daily (modified)   Performed By: Eliseo Gum, PT, AMBER R  03/30/2016 09:30  Treatment Duration: 3 Performed By: Carney Living, AMBER R  03/30/2016 09:30  Planned Treatments: Balance training, Basic activities of daily living, Bed mobility training, Caregiver training, Electrical stimulation, Equipment training, Functional training, Manual therapy, Neuromuscular reeducation, Pain management, Posture/Body mechanics training,... Performed By: Carney Living, AMBER R  03/30/2016 09:30     Short Term Goals Reviewed :   Yes   Physical Therapy Orders :   Physical Therapy Inpatient Additional Treatment Rehab - 03/30/16 11:39:51 EDT, Balance training, Basic activities of daily living, Bed mobility training, Caregiver training, Electrical stimulation, Equipment training, Functional training, Manual therapy, Neuromuscular reeducation, Pain  management, Posture/...  PT FIMS - 03/29/16 19:38:00 EDT, Daily     Pain Present :   No actual or suspected pain   PT Therapeutic Activity,Mobility,Balance :   Yes   PT Therapeutic Exercise :   Yes   PT Wheelchair Management :   Yes   WHITESIDES, PTA, ANDREA G - 04/10/2016 11:32 EDT   Therapeutic Activities/Mobility/Balance   Functional Activity :   Therapeutic Activities  Activity 1:  Bed mobility; Max A; Improved stability in muscle group(s), Improved timing, control, and coordination, Increased activation of targeted muscle group(s); max A for B LE and trunk assist using HOB elevation 2* tetraparesis, decr balance and safety       Performed Date:  04/09/2016  Activity 2:  Transfer training; Max A; Improved stability in muscle group(s), Improved timing, control, and coordination, Increased activation of targeted muscle group(s); SB transfers with max A for ant WS, lateral hip movement and dynamic sitting balance.       Performed Date:  04/09/2016  Activity 3:  Dynamic sitting balance, Static sitting balance; Improved stability in muscle group(s), Improved timing, control, and coordination, Increased activation of targeted muscle group(s); static sitting with perterbations in ant/post and lateral directions for improved trunk control with functional mobility and transfers.       Performed Date:  04/09/2016  Activity 4:  Transitional movement; lateral propped on L elbow to encourage appropriate trunk righting  and improve trunk ROM to L sidebending. Balance on L propped  elbow with close SPV and VC for use of head/shoulders to control balance position.       Performed Date:  04/02/2016     Nash Mantis - 04/10/2016 11:32 EDT   Daiva Nakayama, PTA, Clovis Fredrickson - 04/10/2016 11:32 EDT   PT Therapeutic Activities Grid     Activity 1  Activity 2  Activity 3      Activity :    Bed mobility   Transfer training   Balance        Assist :    Maximal assistance   Total assistance   Contact guard assistance        Equipment :        Sliding board           Comment :    Sit>supine x2 maxA secondary to dec core strength, UE strength, and B LEs strength.   sliding board transfer bed>WC x2 and WC><mat even surfaces 2 person maxA secondary to dec strength, coordination, and endurance.   pt sitting up on EOB working on twisting at the waist looking from side to side, then shifting weight from side to side, and then raising B hands clasped and raising hands up/down 15 reps working on sitting balance, strength, and endurance.          WHITESIDES, PTA, ANDREA G - 04/10/2016 15:50 EDT  Daiva Nakayama, PTA, Sue Lush G - 04/10/2016 15:50 EDT  Daiva Nakayama, PTA, Sue Lush G - 04/10/2016 15:50 EDT       Reassess Mobility :   Yes   WHITESIDES, PTA, ANDREA G - 04/10/2016 11:32 EDT   PT Mobility   Mobility Grid   Sit to Supine :   Rehab Maximal assistance   Transfer Bed to and From Chair :   Rehab Total assistance   Transfer Toilet :   Does not occur   WHITESIDES, PTA, ANDREA G - 04/10/2016 11:32 EDT   Amb Ability Varied Surf/Distraction Grid   Level Surfaces :   Does not occur   Uneven Surfaces :   Does not occur   Distracting Environments :   Does not occur   Curbs :   Does not occur   Stairs :   Does not occur   Ramp :   Does not occur   Carlos American G - 04/10/2016 11:32 EDT   Transfer Type :   Transfer board   PT Mobility Reviewed :   Yes   WHITESIDES, PTA, ANDREA G - 04/10/2016 11:32 EDT   Therapeutic Exercise   Therapeutic Exercise RTF :   Therapeutic Exercise  Exercise 1:  Scapular strengthening; 3 x 10; scap retraction/depression with manual cues to decr overuse of upper trapezius. Mirror for improved visual feedback       Performed Date:  04/09/2016  Exercise 2:  Upper extremity stretching; B upper trapezius stretching for improrved ROM and seated posture       Performed Date:  04/09/2016     Nash Mantis - 04/10/2016 11:32 EDT   WHITESIDES, PTA, Clovis Fredrickson - 04/10/2016 11:32 EDT   Therapeutic Exercise Grid     Exercise 4  Exercise 1  Exercise  2  Exercise 3    Comment :    sitting ankle pumps L LE for increasing ROM, strength, and endurance.   sitting upright in WC and sliding hands forward/back 2 sets of 6 working on core strengthening, ROM, balance, and endurance.   pt tilted back in power WC shoulder retraction  and then shoulder shrugs 10 reps working on strengthening, ROM, and endurance.   tilted back in power WC and reaching forward and grabing the therapists hand 2 sets of 5 reps c the R UE, 10 reps c the L UE active assisted; and then reaching c the L UE across the body 10 reps working on strengthening, coordination, and endurance.       WHITESIDES, PTA, ANDREA G - 04/10/2016 15:50 EDT  Daiva NakayamaWHITESIDES, PTA, Sue LushANDREA G - 04/10/2016 11:32 EDT  Daiva NakayamaWHITESIDES, PTA, Sue LushANDREA G - 04/10/2016 11:32 EDT  WHITESIDES, PTA, ANDREA G - 04/10/2016 11:32 EDT      WC Management   Type of Wheelchair :   Power wheelchair   Wheelchair Details :   pt manuvered power WC around in room and in gym modI.   WHITESIDES, PTA, ANDREA G - 04/10/2016 11:32 EDT   Short Term Goals   Bed Mobility Goal Grid     Goal #1          Descriptors :    Roll to right and left              Level :    Moderate assistance              Status :    Progressing, continue                WHITESIDES, PTA, ANDREA G - 04/10/2016 11:32 EDT         Transfers Goal Grid     Goal #1          Descriptors :    Transfer board              Level :    Moderate assistance              Status :    Progressing, continue                WHITESIDES, PTA, ANDREA G - 04/10/2016 11:32 EDT         W/C Management Grid     Goal #1  Goal #2  Goal #3  Goal #4    Descriptors :    Power wheelchair mobility indoors   Pressure relief tilt in space (power wheelchair)   Power wheelchair mobility indoors   Pressure relief tilt in space (power wheelchair)     Level :    Minimal assistance   Minimal assistance   Setup, Distant supervision   Modified independence     Status :    Goal met   Goal met   Initial   Initial     Date Met :    04/06/2016 EDT    04/06/2016 EDT             WHITESIDES, PTA, ANDREA G - 04/10/2016 11:32 EDT  Daiva NakayamaWHITESIDES, PTA, Sue LushANDREA G - 04/10/2016 11:32 EDT  Daiva NakayamaWHITESIDES, PTA, Sue LushANDREA G - 04/10/2016 11:32 EDT  Daiva NakayamaWHITESIDES, PTA, Clovis FredricksonANDREA G - 04/10/2016 11:32 EDT      PT Balance Goal Grid     Goal #1          Descriptor :    Unsupported short sit              Assist Level :    Moderate assistance              Type :    Static sitting  Length of Time (minutes) :    2 minutes              Rationale :    Improve independence with activities of daily living              Status :    Progressing, continue                WHITESIDES, PTA, ANDREA G - 04/10/2016 11:32 EDT         PT ST Goals Reviewed :   Yes   WHITESIDES, PTA, ANDREA G - 04/10/2016 11:32 EDT   Assessment   PT Impairments or Limitations :   Abnormal tone, Balance deficits, Bed mobility deficits, Coordination/Proprioception deficits, Endurance deficits, Equipment training, Impaired sensation, Range of motion deficits, Strength deficits, Transfer deficits, Transition deficits, Wheelchair mobility deficits   Barriers to Safe Discharge PT :   Medical diagnosis   Discharge Recommendations :   ELOS: 3 weeks; home with assist and PWC     PT Treatment Recommendations :   Continue skilled PT to improve in functional independence.     WHITESIDES, PTA, ANDREA G - 04/10/2016 11:32 EDT   Time Spent With Patient   PT Time In 2 :   13:00 EST   PT Time Out 2 :   14:00 EST   PT Therapeutic Exercise Units :   3 units   PT Therapeutic Exercise Time :   45 minutes   PT ADL TRAINING 15 MN :   5 units   PT ADL Training Time :   75 minutes   PT Total Individual Therapy Time :   120 minutes   PT Total Timed Code Treatment Units :   8 units   PT Total Timed Code Tx Minutes :   120 minutes   PT Total Treatment Time Rehab :   120 minutes   WHITESIDES, PTA, ANDREA G - 04/10/2016 15:50 EDT   PT Time In :   8:30 EST   PT Time Out :   9:30 EST   WHITESIDES, PTA, ANDREA G - 04/10/2016 11:32 EDT   Additional Information    Additional Information PT :   after therapy pt sitting up in power WC c seat belt on.     WHITESIDES, PTA, ANDREA G - 04/10/2016 11:32 EDT

## 2016-04-10 NOTE — Progress Notes (Signed)
OT Inpatient Daily Documentation - Text       OT Inpatient Daily Documentation Entered On:  04/10/2016 12:04 EDT    Performed On:  04/10/2016 10:30 EDT by Marzella SchleinBRAATZ, OT, ELIZABETH G               Reason for Treatment   *Reason for Referral :   Admitted for MS exacerbation    82 yo female with past medical history of progressive MS diagnosed in 1989, CVA with right sided weakness, hypothyroidism and neurogenic bladder admitted to [facility] in [place] on 03/06/16 with recurrent generalized weakness of the upper and lower extremities and poor posture. She has been wheelchair bound for the past few years due to right-sided weakness.    (per pt, she did not have a CVA)     *Chief Complaint :   weakness     BRAATZ, OT, ELIZABETH G - 04/10/2016 11:55 EDT   Review/Treatments Provided   OT Goals :   OT Short Term Goals    04/08/2016  Eating Goal #1: Manage food containers/packages; Distant S; Progressing, continue  Upper Body Dressing Goal #2: Upper extremity dressing; Minimal assistance; Initial  Lower Body Dressing Goal #1: Lower extremity dressing; Supine, Supported short sit, Unsupported short sit; Max A; Progressing, continue  Toileting and Transfers Goal #1: Toilet transfers; Max A; Bedside commode; Drop arm commode, Elevated toilet seat, Grab bars, Sliding board; Progressing, continue  Toileting and Transfers Goal #2: Toileting; Max A; Drop arm commode, Elevated toilet seat, Grab bars, Sliding board; Progressing, continue  Balance Goal #1: Unsupported short sit; Static sitting; CGA; 5; Improve independence with activities of daily living; Initial     OT Plan :   Treatment Frequency: Daily Performed By: Carilyn GoodpastureHOBBY, OT, LESLIE M   03/30/2016  Treatment Duration: 3 Performed By: Katherine RoanHOBBY, OT, LESLIE M   03/30/2016  Planned Treatments: Balance training, Basic Activities of Daily Living, Caregiver training, Coordination, Energy conservation training, Equipment training, Group therapy, Mobility training, Patient education,  Safety education, Therapeutic activities, Therapeutic exercises... Performed By: Katherine RoanHOBBY, OT, LESLIE M   03/30/2016     Occupational Therapy Orders :   Occupational Therapy Inpatient Additional Treatment Rehab - 03/30/16 16:40:41 EDT, Balance training, Basic Activities of Daily Living, Caregiver training, Coordination, Energy conservation training, Equipment training, Group therapy, Mobility training, Patient education, Safety education, Therapeutic activitie...  OT FIMS - 03/29/16 19:38:00 EDT, Daily     Pain Present :   No actual or suspected pain   OT Therapeutic Activity,Mobility,Balance :   Yes   OT Therapeutic Exercise :   Yes   OT Neuromuscular Reeducation :   Yes   BRAATZ, OT, ELIZABETH G - 04/10/2016 11:55 EDT   Therapeutic Activities   OT Therapeutic Activities RTF :   Therapeutic Activities  Activity 1:  Dressing activity; Total A; Pt rolling to R and L sides c use of bed rail and therapist pull briefs up over hips and buttocks 2* decr strength in BUEs and BLEs.       Performed Date:  04/09/2016  Activity 2:  Transitional movement; Total A; Hoyer lift; Hoyer lift utilized for t/f OOB into w/c 2* decr overall strength/endurance and standing balance.       Performed Date:  04/09/2016  Activity 3:  Transitional movement; Mod A; Sitting in wheelchair, Supported short sit; Improved stability in muscle group(s), Improved timing, control, and coordination, Increased activation of targeted muscle group(s), Integrated muscular activation into functional activity; pt performed lat scoot transfer  with gait belt around hips and sliding board from w/c <> EOM x2.  Pt's husband performed trasnfers 1x with CGA from OT after ed.       Performed Date:  04/09/2016  Activity 4:  Other: Dynamic sitting balance; Minimal assistance; Supported short sit, Unsupported short sit; Other: Ball/bean bags; Improved stability in muscle group(s), Improved timing, control, and coordination, Increased activation of targeted muscle group(s),  Integrated muscular activation into functional activity; Pt performed anterior reaching to place bean bags into container on table top with occassional assist to maintain balance  when reachign with RUE.  Task to increase dynamic sitting balance needed for greater I with transfers and ADL performance.       Performed Date:  04/09/2016  Activity 5:  Improved stability in muscle group(s), Improved timing, control, and coordination, Increased activation of targeted muscle group(s), Integrated muscular activation into functional activity       Performed Date:  04/09/2016     Roby Lofts - 04/10/2016 11:55 EDT   OT Therapeutic Activities Grid     Activity 1          Activities :    Transitional movement              Assist :    Maximal assistance              Position :    Sitting in wheelchair, Unsupported short sit              Equipment :    Sliding board              Response :    Improved stability in muscle group(s), Improved timing, control, and coordination, Increased activation of targeted muscle group(s)              Comments :    Pt performed sliding board transfer max A w/c <> EOM                BRAATZ, OT, ELIZABETH G - 04/10/2016 11:55 EDT         Therapeutic Exercise   Therapeutic Exercise RTF :   Therapeutic Exercise  Exercise 1:  Pt completed arm skate with R UE in all planes with 4# wt x 20 reps and min A. Pt pushed bean bags up incline with B UE's, mod A for R min A for L to increase UE and trunk strength. Pt took out inset pegs with R UE and mod A for UE support and placed in       Performed Date:  04/04/2016  Exercise 2:  with L hand and increased time due to fatigue and decreased strength. Placed key shaped pegs in holes with increased difficulty and L hand.       Performed Date:  04/04/2016     Roby Lofts - 04/10/2016 11:55 EDT   OT Therapeutic Exercise Grid     Exercise 1          Exercise :    Upper extremity strengthening              Position :    Unsupported ring sit               Equipment/Device :    Theraband              Repetition/Time :    15x2              Resistance/Assist :  green              Comments :    Pt performed scap retraction and external rotation seated EOM to increase posterior shoulder stability.                  BRAATZ, OT, ELIZABETH G - 04/10/2016 11:55 EDT         Neuromuscular Reeducation   OT Neuromuscular Reeducation RTF :   Neuromuscular Reeducation  Activity 1:  Pt completed B UE AAROM with GE and B shlds at 90* in all planes. Pt c/o shld pain in L shld completing elbow flex with shld at 90*. No pain at  45* to complete movement. Decreased weakness in R UE distally then L.       Performed Date:  04/02/2016     Roby Lofts - 04/10/2016 11:55 EDT   OT Neuromuscular Reeducation Grid     Activity 1  Activity 2        Activities :    Tolerance to upright position   Neurodevelopment technique activities           Assist :    Minimal assistance   Minimal assistance           Position :    Unsupported short sit   Unsupported short sit           Equipment :    Other: knotted towels   Other: knotted towels           Response :    Improved stability in muscle group(s), Improved timing, control, and coordination, Increased activation of targeted muscle group(s)   Improved stability in muscle group(s), Improved timing, control, and coordination, Increased activation of targeted muscle group(s)           Comments :    Pt engaged in trunk strengthening task seated EOM, reaching across midline 10x2 to improve balance when reaching out of base of support.  Pt required rest breaks between sets.     Pt engaged in anterior reaching with cues to maintain WB and muscle activation in BLE to increase I with toilet transfers.  Pt noted to have increased activation in LLE vs RLE.               Flora Lipps G - 04/10/2016 11:55 EDT  Roby Lofts - 04/10/2016 11:55 EDT        Education   Responsible Learner Present for Session :   Yes   Home Caregiver  Name/Relationship :   Toy Cookey  daughter- beth   Barriers To Learning :   None evident   Teaching Method :   Demonstration, Explanation   BRAATZ, OT, ELIZABETH G - 04/10/2016 11:55 EDT   Occupational Therapy Education Grid   Activity of Daily Living Training :   Needs further teaching, Needs practice/supervision   BRAATZ, OT, ELIZABETH G - 04/10/2016 11:55 EDT   OT Additional Education :   Pt ed on purpose of therapy tasks, OT plan of care for FES cycle training and bathing on Friday.       BRAATZ, OT, ELIZABETH G - 04/10/2016 11:55 EDT   Assessment   OT Impairments or Limitations :   Balance deficits, Basic activity of daily living deficits, Coordination deficits, Endurance deficits, Equipment training, IADL deficits, Mobility deficits, Proprioception deficits, Safety awareness deficits, Strength deficits   Barriers to Safe Discharge OT :   Complicated medical history, Decreased communication, Limited  family support, Limited social support, Medical diagnosis, Progressive nature of disease, Safety awareness, Severity of deficits   OT Discharge Recommendations :   ELOS 3 weeks then home with spouse and power w/c     OT Treatment Recommendations :   Pt seen for OT to address deficits related to balance and BUE strength and posture to improve I with toilet transfers and LB dressing.  Pt max A for SB transfer to EOM.  Pt with improved balance this date, requiring only assistance when reaching far out of base of support or laterally leaning on elbow.  Pt with decreased balance when reaching anteriorly, however.  Pt verbalized concern about discharge home without ability to transfer to toilet with more I.  OT discused plan to include WB through BLE, further improving balance and BUE strength and stability to improve I with transfers and LB dressing.       BRAATZ, OT, ELIZABETH G - 04/10/2016 11:55 EDT   Time Spent With Patient   OT Time In :   10:30 EST   OT Time Out :   11:30 EST   OT Therapeutic Exercise  Units :   1 units   OT Therapeutic Exercise Time :   15 minutes   OT Neuromuscular Reeducation Units :   3 units   OT Neuromuscular Reeducation Time :   45 minutes   OT Total Individual Therapy Time :   60 minutes   OT Total Timed Code Treatment Units :   4 units   OT Total Timed Code Treatment Minutes :   60 minutes   OT Total Treatment Time Rehab :   60 minutes   BRAATZ, OT, ELIZABETH G - 04/10/2016 11:55 EDT

## 2016-04-10 NOTE — Care Plan (Signed)
 ED Christine [name]     Patient Education Materials Follows:

## 2016-04-10 NOTE — Progress Notes (Signed)
Functional Indep Measure Scores - Text       Functional Independence Measure Scores Entered On:  04/10/2016 9:23 EDT    Performed On:  04/10/2016 9:23 EDT by Farrel DemarkMitchum, RN, Christina L               Eating Score   Patient's Independence Level With Eating Tasks :   Independent/Modified independence   Independent or Modifications Needed :   Complete independence   Functional Independence Measure Eating :   Complete independence   Mitchum, RN, Elvin SoChristina L - 04/10/2016 9:23 EDT   Toileting Score   Patient's Independence Level with Toileting Tasks :   Does not occur   Toileting :   Does not occur   Mitchum, RN, Christina L - 04/10/2016 9:23 EDT   Bladder Management Score   Patient's Independence Level with Bladder Management Tasks :   Assistance   Type of Assistance Necessary :   Helper changes linen or clothing/cleans up spill, Helper changes diaper or absorbent pad   Functional Independence Measure Bladder Management :   Total assistance   Mitchum, RN, Elvin SoChristina L - 04/10/2016 9:23 EDT   Bowel Management Score   Patient's Independence Level with Bowel Management Tasks :   Assistance   Type of Assistance Necessary :   Helper changes linen or clothing/cleans up spill, Helper changes diaper   Functional Independence Measure Bowel Management :   Total assistance   Mitchum, RN, Elvin SoChristina L - 04/10/2016 9:23 EDT   Transfer Bed/Chair/WC Score   Patient's independence Level with Bed, Chair, Wheelchair Tasks :   Assistance   Type of Assistance Necessary :   Two helpers needed, Mechanical lift required, Total assist (patient performs less than 25%)   Bed, Chair, Wheelchair Transfer :   Total assistance   Mitchum, RN, Elvin SoChristina L - 04/10/2016 9:23 EDT   Transfer Toilet Score   Patient's Independence Level with Transfer Toilet Tasks :   Does not occur   Transfer Toilet :   Does not occur   Mitchum, RN, Christina L - 04/10/2016 9:23 EDT

## 2016-04-10 NOTE — Nursing Note (Signed)
Medication Administration Follow Up-Text       Medication Administration Follow Up Entered On:  04/10/2016 18:08 EDT    Performed On:  04/10/2016 18:07 EDT by Mitchum, RN, Christina L      Intervention Information:     tramadol  Performed by Mitchum, RN, Christina L on 04/10/2016 10:13:00 EDT       tramadol,25mg   Oral,mild pain (1-3) or temp > 100.5 F       Medication Effectiveness Evaluation   Medication Administration Reason :   Pain   Medication Effective :   Yes   Medication Response :   Symptoms improved   Mitchum, RN, Christina L - 04/10/2016 18:07 EDT

## 2016-04-10 NOTE — Progress Notes (Signed)
Functional Indep Measure Scores - Text       Functional Independence Measure Scores Entered On:  04/10/2016 11:55 EDT    Performed On:  04/10/2016 10:30 EDT by Marzella SchleinBRAATZ, OT, ELIZABETH G               Comprehension Score   Mode of Comprehension :   Auditory   Comprehends Complex or Abstract Information Without Prompting or Cueing :   Yes   Understands Complex or Abstract Directions and Conversations :   At all times   Comprehension Indep Measure Interim :   Complete independence   BRAATZ, OT, ELIZABETH G - 04/10/2016 11:54 EDT   Expression Score   Expression Mode :   Vocal   Expresses Complex or Abstract Information Without Prompting or Cueing :   Yes   Expresses Complex or Abstract Ideas :   Clearly and fluently at all times   Expression Indep Measure Interim :   Complete independence   Roby LoftsBRAATZ, OT, ELIZABETH G - 04/10/2016 11:54 EDT   Social Interaction Score   Interacts Appropriately Without Supervision :   Yes   Interacts Appropriately :   At all times   Social Interaction Indep Measure Interim :   Complete independence   BRAATZ, OT, ELIZABETH G - 04/10/2016 11:54 EDT   Problem Solving Score   Solves Complex Problems :   Yes   Ability to Solve Complex Problems :   Consistently solves problems independently   Problem Solving Indep Measure Interim :   Complete independence   BRAATZ, OT, ELIZABETH G - 04/10/2016 11:54 EDT   Memory Score   Recognizes, Remembers Routines, and Executes Requests Without Prompting :   Yes   Remembers and Executes Requests :   Consistently without need for repetition   Memory Indep Measure Interim :   Complete independence   BRAATZ, OT, ELIZABETH G - 04/10/2016 11:54 EDT

## 2016-04-10 NOTE — Progress Notes (Signed)
OT Inpatient Daily Documentation - Text       OT Inpatient Daily Documentation Entered On:  04/10/2016 14:56 EDT    Performed On:  04/10/2016 14:52 EDT by Alton RevereHEMBERGER, OT, SHANNON R               Reason for Treatment   *Reason for Referral :   Admitted for MS exacerbation    82 yo female with past medical history of progressive MS diagnosed in 1989, CVA with right sided weakness, hypothyroidism and neurogenic bladder admitted [facility] in [place] on 03/06/16 with recurrent generalized weakness of the upper and lower extremities and poor posture. She has been wheelchair bound for the past few years due to right-sided weakness.    (per pt, she did not have a CVA)     *Chief Complaint :   weakness     HEMBERGER, OT, SHANNON R - 04/10/2016 14:52 EDT   Review/Treatments Provided   OT Goals :   OT Short Term Goals    04/08/2016  Eating Goal #1: Manage food containers/packages; Distant S; Progressing, continue  Upper Body Dressing Goal #2: Upper extremity dressing; Minimal assistance; Initial  Lower Body Dressing Goal #1: Lower extremity dressing; Supine, Supported short sit, Unsupported short sit; Max A; Progressing, continue  Toileting and Transfers Goal #1: Toilet transfers; Max A; Bedside commode; Drop arm commode, Elevated toilet seat, Grab bars, Sliding board; Progressing, continue  Toileting and Transfers Goal #2: Toileting; Max A; Drop arm commode, Elevated toilet seat, Grab bars, Sliding board; Progressing, continue  Balance Goal #1: Unsupported short sit; Static sitting; CGA; 5; Improve independence with activities of daily living; Initial     OT Plan :   Treatment Frequency: Daily Performed By: Carilyn GoodpastureHOBBY, OT, LESLIE M   03/30/2016  Treatment Duration: 3 Performed By: Katherine RoanHOBBY, OT, LESLIE M   03/30/2016  Planned Treatments: Balance training, Basic Activities of Daily Living, Caregiver training, Coordination, Energy conservation training, Equipment training, Group therapy, Mobility training, Patient education,  Safety education, Therapeutic activities, Therapeutic exercises... Performed By: Katherine RoanHOBBY, OT, LESLIE M   03/30/2016     Occupational Therapy Orders :   Occupational Therapy Inpatient Additional Treatment Rehab - 03/30/16 16:40:41 EDT, Balance training, Basic Activities of Daily Living, Caregiver training, Coordination, Energy conservation training, Equipment training, Group therapy, Mobility training, Patient education, Safety education, Therapeutic activitie...  OT FIMS - 03/29/16 19:38:00 EDT, Daily     Pain Present :   No actual or suspected pain   OT Therapeutic Activity,Mobility,Balance :   Yes   HEMBERGER, OT, SHANNON R - 04/10/2016 14:52 EDT   Therapeutic Activities   OT Therapeutic Activities RTF :   Therapeutic Activities  Activity 1:  Transitional movement; Max A; Sitting in wheelchair, Unsupported short sit; Sliding board; Improved stability in muscle group(s), Improved timing, control, and coordination, Increased activation of targeted muscle group(s); Pt performed sliding board transfer max A w/c <> EOM       Performed Date:  04/10/2016  Activity 2:  Transitional movement; Total A; Hoyer lift; Hoyer lift utilized for t/f OOB into w/c 2* decr overall strength/endurance and standing balance.       Performed Date:  04/09/2016  Activity 3:  Transitional movement; Mod A; Sitting in wheelchair, Supported short sit; Improved stability in muscle group(s), Improved timing, control, and coordination, Increased activation of targeted muscle group(s), Integrated muscular activation into functional activity; pt performed lat scoot transfer with gait belt around hips and sliding board from w/c <> EOM  x2.  Pt's husband performed trasnfers 1x with CGA from OT after ed.       Performed Date:  04/09/2016  Activity 4:  Other: Dynamic sitting balance; Minimal assistance; Supported short sit, Unsupported short sit; Other: Ball/bean bags; Improved stability in muscle group(s), Improved timing, control, and coordination,  Increased activation of targeted muscle group(s), Integrated muscular activation into functional activity; Pt performed anterior reaching to place bean bags into container on table top with occassional assist to maintain balance  when reachign with RUE.  Task to increase dynamic sitting balance needed for greater I with transfers and ADL performance.       Performed Date:  04/09/2016  Activity 5:  Improved stability in muscle group(s), Improved timing, control, and coordination, Increased activation of targeted muscle group(s), Integrated muscular activation into functional activity       Performed Date:  04/09/2016     Veverly Fells R - 04/10/2016 14:52 EDT   OT Therapeutic Activities Grid     Activity 1  Activity 2        Activities :    Transitional movement   Reaching anteriorly, Other: core strengthening/dynamic sitting balance           Assist :    Maximal assistance   Moderate assistance           Comments :    Max A slide board transfer from R and L from wc>EOM and EOM>wc   Sitting EOM, pt anteriorly reached to touch ball to ground using BUEs for 5 reps x 2 sets with Min A for control for trunk flexion and Mod A for trunk extension to come to upright sitting posture.  Task to incr core strength/dynamic sitting balance              HEMBERGER, OT, SHANNON R - 04/10/2016 14:52 EDT  HEMBERGER, OT, SHANNON R - 04/10/2016 14:52 EDT        Assessment   OT Impairments or Limitations :   Balance deficits, Basic activity of daily living deficits, Coordination deficits, Endurance deficits, Equipment training, IADL deficits, Mobility deficits, Proprioception deficits, Safety awareness deficits, Strength deficits   Barriers to Safe Discharge OT :   Complicated medical history, Decreased communication, Limited family support, Limited social support, Medical diagnosis, Progressive nature of disease, Safety awareness, Severity of deficits   OT Discharge Recommendations :   ELOS 3 weeks then home with spouse and power  w/c     OT Treatment Recommendations :   Pt tolerated tx session well. Pt continues to need Max A for slide board transfers and demonstrates decreased core strength/dynamic sitting balance during anterior reaching tasks.  Pt would continue to benefit from skilled OT services to address continued deficits for incr I c ADLs and fxl mob/transfers      HEMBERGER, OT, SHANNON R - 04/10/2016 14:52 EDT   Time Spent With Patient   OT Time In :   14:00 EST   OT Time Out :   14:30 EST   OT Functional Activities Minutes :   30 minutes   OT FUNCTIONAL TRNG 15 MIN :   2    OT Total Individual Therapy Time :   30 minutes   OT Total Timed Code Treatment Units :   2 units   OT Total Timed Code Treatment Minutes :   30 minutes   OT Total Treatment Time Rehab :   30 minutes   HEMBERGER, OT, SHANNON R - 04/10/2016 14:52  EDT

## 2016-04-10 NOTE — Progress Notes (Signed)
Functional Indep Measure Scores - Text       Functional Independence Measure Scores Entered On:  04/10/2016 15:50 EDT    Performed On:  04/10/2016 15:50 EDT by Daiva NakayamaWHITESIDES, PTA, ANDREA G               Transfer Bed/Chair/WC Score   Patient's independence Level with Bed, Chair, Wheelchair Tasks :   Assistance   Type of Assistance Necessary :   Assistance for more than half (patient performs 25% - 49% of tasks), Two helpers needed   Bed, Chair, Wheelchair Transfer :   Total assistance   Nash MantisWHITESIDES, PTA, ANDREA G - 04/10/2016 15:50 EDT

## 2016-04-11 NOTE — Nursing Note (Signed)
Medication Administration Follow Up-Text       Medication Administration Follow Up Entered On:  04/11/2016 17:53 EDT    Performed On:  04/11/2016 17:53 EDT by Farrel DemarkMitchum, RN, Christina L      Intervention Information:     tramadol  Performed by Mitchum, RN, Christina L on 04/11/2016 13:17:00 EDT       tramadol,25mg   Oral,mild pain (1-3) or temp > 100.5 F       Medication Effectiveness Evaluation   Medication Administration Reason :   Pain   Medication Effective :   Yes   Medication Response :   Symptoms improved   Mitchum, RN, Christina L - 04/11/2016 17:53 EDT

## 2016-04-11 NOTE — Progress Notes (Signed)
 PT Inpatient Daily Documentation - Text       PT Inpatient Daily Documentation Entered On:  04/11/2016 12:26 EDT    Performed On:  04/11/2016 8:30 EDT by DELICE, PTA, DAMIEN HERO               Reason for Treatment   Subjective Statement :   pt supine in bed, ready for session. pt handed off to OT,after assistting OT c tx to shower chair for tiolleting     *Reason for Referral :   Progressive MS (dx 1989)  new weakness following UTI and C-diff    3 hrs/day     *Chief Complaint :   weakness bilat LE's, tightness R ankle/calf     GATES, PTA, LEANNE M - 04/11/2016 12:29 EDT   Review/Treatments Provided   PT Goals :   PT Short Term Goals    04/10/2016  Bed Mobility Goal #1: Roll to right and left; Mod A; Progressing, continue  Transfer Goal #1: Transfer board; Mod A; Progressing, continue  Wheelchair Management Goal #3: Power wheelchair mobility indoors; Setup, Distant S; Initial  Wheelchair Management Goal #4: Pressure relief tilt in space (power wheelchair); Mod I; Initial  Balance Goal #1: Unsupported short sit; Mod A; Static sitting; 2; Improve independence with activities of daily living; Progressing, continue     PT Plan :   Treatment Frequency:  Daily (modified)   Performed By: LORELLA, PT, AMBER R  03/30/2016 09:30  Treatment Duration: 3 Performed By: LORELLA KLEIN, AMBER R  03/30/2016 09:30  Planned Treatments: Balance training, Basic activities of daily living, Bed mobility training, Caregiver training, Electrical stimulation, Equipment training, Functional training, Manual therapy, Neuromuscular reeducation, Pain management, Posture/Body mechanics training,... Performed By: LORELLA KLEIN, AMBER R  03/30/2016 09:30     Short Term Goals Reviewed :   Yes   Physical Therapy Orders :   Physical Therapy Inpatient Additional Treatment Rehab - 03/30/16 11:39:51 EDT, Balance training, Basic activities of daily living, Bed mobility training, Caregiver training, Electrical stimulation, Equipment training, Functional training, Manual  therapy, Neuromuscular reeducation, Pain management, Posture/...  PT FIMS - 03/29/16 19:38:00 EDT, Daily     Pain Present :   No actual or suspected pain   PT Therapeutic Activity,Mobility,Balance :   Yes   PT Therapeutic Exercise :   Yes   GATES, PTA, LEANNE M - 04/11/2016 12:29 EDT   Therapeutic Activities/Mobility/Balance   Functional Activity :   Therapeutic Activities  Activity 1:  Bed mobility; Max A; Sit>supine x2 maxA secondary to dec core strength, UE strength, and B LEs strength.       Performed Date:  04/10/2016  Activity 2:  Transfer training; Total A; Sliding board; sliding board transfer bed>WC x2 and WC><mat even surfaces 2 person maxA secondary to dec strength, coordination, and endurance.       Performed Date:  04/10/2016  Activity 3:  Balance; CGA; pt sitting up on EOB working on twisting at the waist looking from side to side, then shifting weight from side to side, and then raising B hands clasped and raising hands up/down 15 reps working on sitting balance, strength, and endurance.       Performed Date:  04/10/2016  Activity 4:  Transitional movement; lateral propped on L elbow to encourage appropriate trunk righting  and improve trunk ROM to L sidebending. Balance on L propped elbow with close SPV and VC for use of head/shoulders to control balance position.       Performed  Date:  04/02/2016     GATES, PTA, LEANNE M - 04/11/2016 12:29 EDT   PT Therapeutic Activities Grid     Activity 1          Activity :    Transfer training              Assist :    Maximal assistance              Equipment :    Sliding board, Other: maxi slide              Response :    Decreased endurance, Demonstrated positive response to treatment, Improved stability in muscle group(s), Required rest breaks              Comment :    pt c tx bed to wc to R, c SB.  pt c tx wc to bed to L and ret to wc to R c use of SB and maxi slide to asssit dec shear and friction. pt c max assit due to poor turnk, ue and le strength , dec  body enduracen and dec sitting bal c vc for head hip movment                DELICE JOSETTA DAMIEN CHRISTELLA - 04/11/2016 12:29 EDT         Reassess Mobility :   Yes   GATES, PTA, DAMIEN CHRISTELLA - 04/11/2016 12:29 EDT   PT Mobility   Mobility Grid   Roll Left :   Rehab Maximal assistance   Roll Right :   Rehab Total assistance   Roll Supine :   Rehab Total assistance   Supine to Sit :   Rehab Maximal assistance   Sit to Supine :   Rehab Maximal assistance   Scooting :   Rehab Total assistance   Transfer Bed to and From Chair :   Rehab Maximal assistance   DELICE JOSETTA DAMIEN CHRISTELLA - 04/11/2016 12:29 EDT   Functional Mobility Details :   slide board with use of maxi slide   GATES, PTA, DAMIEN CHRISTELLA - 04/11/2016 12:29 EDT   Amb Ability Varied Surf/Distraction Grid   Level Surfaces :   Does not occur   Uneven Surfaces :   Does not occur   Distracting Environments :   Does not occur   Curbs :   Does not occur   Stairs :   Does not occur   Ramp :   Does not occur   DELICE JOSETTA DAMIEN CHRISTELLA - 04/11/2016 12:29 EDT   Transfer Type :   Transfer board   PT Mobility Reviewed :   Yes   GATES, PTA, DAMIEN CHRISTELLA - 04/11/2016 12:29 EDT   Therapeutic Exercise   Therapeutic Exercise RTF :   Therapeutic Exercise  Exercise 1:  sitting upright in WC and sliding hands forward/back 2 sets of 6 working on core strengthening, ROM, balance, and endurance.       Performed Date:  04/10/2016  Exercise 2:  pt tilted back in power WC shoulder retraction and then shoulder shrugs 10 reps working on strengthening, ROM, and endurance.       Performed Date:  04/10/2016  Exercise 3:  tilted back in power WC and reaching forward and grabing the therapists hand 2 sets of 5 reps c the R UE, 10 reps c the L UE active assisted; and then reaching c the L UE across the body 10 reps working on strengthening,  coordination, and endurance.       Performed Date:  04/10/2016  Exercise 4:  sitting ankle pumps L LE for increasing ROM, strength, and endurance.       Performed Date:  04/10/2016     GATES,  PTA, LEANNE M - 04/11/2016 12:29 EDT   Therapeutic Exercise Grid     Exercise 1  Exercise 2        Position :    Unsupported sit              Comment :    pt perforemd in short sit on mat. atl between ant and post prop x 10 for sititng bal and turnk stability, forward flex and pushing back up to sit c bue for bue strength and turnk strength, x 10, Lat wt beaing on elbows x 8 reps atl sides.   shld retraction x 10.  APT strecth x 5 c 30 sec hold and lat trunk stretch to L x 5. pt performed all ex to assist c trunk and ue strength and rom to assist c cont progression and tol for fxn skilled act and to assist c progression of controled mobility             GATES, JOSETTA DAMIEN HERO - 04/11/2016 12:29 EDT  GATES, PTA, LEANNE M - 04/11/2016 12:29 EDT        Short Term Goals   Bed Mobility Goal Grid     Goal #1          Descriptors :    Roll to right and left              Level :    Moderate assistance              Status :    Progressing, continue                GATES, PTA, LEANNE M - 04/11/2016 12:29 EDT         Transfers Goal Grid     Goal #1          Descriptors :    Transfer board              Level :    Moderate assistance              Status :    Progressing, continue                GATES, PTA, LEANNE M - 04/11/2016 12:29 EDT         W/C Management Grid     Goal #1  Goal #2  Goal #3  Goal #4    Descriptors :    Power wheelchair mobility indoors   Pressure relief tilt in space (power wheelchair)   Power wheelchair mobility indoors   Pressure relief tilt in space (power wheelchair)     Level :    Minimal assistance   Minimal assistance   Setup, Distant supervision   Modified independence     Status :    Goal met   Goal met   Initial   Initial     Date Met :    04/06/2016 EDT   04/06/2016 EDT             GATES, PTA, DAMIEN M - 04/11/2016 12:29 EDT  GATES, PTA, DAMIEN HERO - 04/11/2016 12:29 EDT  GATES, PTA, DAMIEN HERO - 04/11/2016 12:29 EDT  GATES, PTA, DAMIEN HERO - 04/11/2016 12:29 EDT  PT Balance Goal Grid     Goal #1           Descriptor :    Unsupported short sit              Assist Level :    Moderate assistance              Type :    Static sitting              Length of Time (minutes) :    2 minutes              Rationale :    Improve independence with activities of daily living              Status :    Progressing, continue                GATES, PTA, LEANNE M - 04/11/2016 12:29 EDT         PT ST Goals Reviewed :   Yes   GATES, PTA, DAMIEN HERO - 04/11/2016 12:29 EDT   Assessment   PT Impairments or Limitations :   Abnormal tone, Balance deficits, Bed mobility deficits, Coordination/Proprioception deficits, Endurance deficits, Equipment training, Impaired sensation, Range of motion deficits, Strength deficits, Transfer deficits, Transition deficits, Wheelchair mobility deficits   Barriers to Safe Discharge PT :   Medical diagnosis   Discharge Recommendations :   ELOS: 3 weeks; home with assist and PWC     PT Treatment Recommendations :   pt with good effot c all act however required lengthy rest breakes at times due to poor enduracen and strenght. pt cont to be max\total assist c fxnl mob and act. pt will cont to benfit form skilled mob to maxmize strength, enduracen and safey c tx and fxn mob to allow for inc safey  and inc fam mgt at home     GATES, PTA, LEANNE M - 04/11/2016 12:26 EDT   Time Spent With Patient   PT Time In :   8:30 EST   PT Time Out :   10:00 EST   PT Therapeutic Exercise Units :   4 units   PT Therapeutic Exercise Time :   60 minutes   PT ADL TRAINING 15 MN :   2 units   PT ADL Training Time :   30 minutes   PT Total Individual Therapy Time :   90 minutes   PT Total Timed Code Treatment Units :   6 units   PT Total Timed Code Tx Minutes :   90 minutes   PT Total Treatment Time Rehab :   90 minutes   GATES, PTA, DAMIEN HERO - 04/11/2016 12:26 EDT

## 2016-04-11 NOTE — Progress Notes (Signed)
OT Inpatient Daily Documentation - Text       OT Inpatient Daily Documentation Entered On:  04/11/2016 12:35 EDT    Performed On:  04/11/2016 10:00 EDT by WATERS, OT, MANDI R               Reason for Treatment   *Reason for Referral :   Admitted for MS exacerbation    82 yo female with past medical history of progressive MS diagnosed in 1989, CVA with right sided weakness, hypothyroidism and neurogenic bladder admitted to [facility] in [place], [place] on 03/06/16 with recurrent generalized weakness of the upper and lower extremities and poor posture. She has been wheelchair bound for the past few years due to right-sided weakness.    (per pt, she did not have a CVA)     *Chief Complaint :   weakness     WATERS, OT, MANDI R - 04/11/2016 12:29 EDT   Review/Treatments Provided   OT Goals :   OT Short Term Goals    04/08/2016  Eating Goal #1: Manage food containers/packages; Distant S; Progressing, continue  Upper Body Dressing Goal #2: Upper extremity dressing; Minimal assistance; Initial  Lower Body Dressing Goal #1: Lower extremity dressing; Supine, Supported short sit, Unsupported short sit; Max A; Progressing, continue  Toileting and Transfers Goal #1: Toilet transfers; Max A; Bedside commode; Drop arm commode, Elevated toilet seat, Grab bars, Sliding board; Progressing, continue  Toileting and Transfers Goal #2: Toileting; Max A; Drop arm commode, Elevated toilet seat, Grab bars, Sliding board; Progressing, continue  Balance Goal #1: Unsupported short sit; Static sitting; CGA; 5; Improve independence with activities of daily living; Initial     OT Plan :   Treatment Frequency: Daily Performed By: Carilyn Goodpasture M   03/30/2016  Treatment Duration: 3 Performed By: Katherine Roan   03/30/2016  Planned Treatments: Balance training, Basic Activities of Daily Living, Caregiver training, Coordination, Energy conservation training, Equipment training, Group therapy, Mobility training, Patient education, Safety  education, Therapeutic activities, Therapeutic exercises... Performed By: Katherine Roan   03/30/2016     Occupational Therapy Orders :   Occupational Therapy Inpatient Additional Treatment Rehab - 03/30/16 16:40:41 EDT, Balance training, Basic Activities of Daily Living, Caregiver training, Coordination, Energy conservation training, Equipment training, Group therapy, Mobility training, Patient education, Safety education, Therapeutic activitie...  OT FIMS - 03/29/16 19:38:00 EDT, Daily     Pain Present :   No actual or suspected pain   OT Therapeutic Activity,Mobility,Balance :   Yes   OT Modalities :   Yes   WATERS, OT, MANDI R - 04/11/2016 12:29 EDT   Therapeutic Activities   OT Therapeutic Activities RTF :   Therapeutic Activities  Activity 1:  Transitional movement; Max A; Max A slide board transfer from R and L from wc>EOM and EOM>wc       Performed Date:  04/10/2016  Activity 2:  Reaching anteriorly, Other: core strengthening/dynamic sitting balance; Mod A; Sitting EOM, pt anteriorly reached to touch ball to ground using BUEs for 5 reps x 2 sets with Min A for control for trunk flexion and Mod A for trunk extension to come to upright sitting posture.  Task to incr core strength/dynamic sitting balance       Performed Date:  04/10/2016  Activity 3:  Transitional movement; Mod A; Sitting in wheelchair, Supported short sit; Improved stability in muscle group(s), Improved timing, control, and coordination, Increased activation of targeted muscle group(s), Integrated muscular  activation into functional activity; pt performed lat scoot transfer with gait belt around hips and sliding board from w/c <> EOM x2.  Pt's husband performed trasnfers 1x with CGA from OT after ed.       Performed Date:  04/09/2016  Activity 4:  Other: Dynamic sitting balance; Minimal assistance; Supported short sit, Unsupported short sit; Other: Ball/bean bags; Improved stability in muscle group(s), Improved timing, control, and  coordination, Increased activation of targeted muscle group(s), Integrated muscular activation into functional activity; Pt performed anterior reaching to place bean bags into container on table top with occassional assist to maintain balance  when reachign with RUE.  Task to increase dynamic sitting balance needed for greater I with transfers and ADL performance.       Performed Date:  04/09/2016  Activity 5:  Improved stability in muscle group(s), Improved timing, control, and coordination, Increased activation of targeted muscle group(s), Integrated muscular activation into functional activity       Performed Date:  04/09/2016     WATERS, OT, MANDI R - 04/11/2016 12:29 EDT   OT Therapeutic Activities Grid     Activity 1          Comments :    Pt completed toilet transfer and toileting with total A,                WATERS, OT, MANDI R - 04/11/2016 12:29 EDT         Modalities   Modalities     Activity 1          Modality :    Electrical stimulation, attended              Body Region :    R wrist flex/ext, scap, obliques, L scap, sublux              Minutes :    30 minutes              Response :    Tolerated well              Comments  (Comment: Pt set up on FES to increase strength in trunk, obliques, and grasp, as well as decrease pain in L shld. Pt tolerated well. with carryover off cycle 10 x 4 reps for trunk R flex to increase strength and trunk stability.  [WATERS, OT, MANDI R - 04/11/2016 12:29 EDT] )         WATERS, OT, MANDI R - 04/11/2016 12:29 EDT         Assessment   OT Impairments or Limitations :   Balance deficits, Basic activity of daily living deficits, Coordination deficits, Endurance deficits, Equipment training, IADL deficits, Mobility deficits, Proprioception deficits, Safety awareness deficits, Strength deficits   Barriers to Safe Discharge OT :   Complicated medical history, Decreased communication, Limited family support, Limited social support, Medical diagnosis, Progressive nature of disease,  Safety awareness, Severity of deficits   OT Discharge Recommendations :   ELOS 3 weeks then home with spouse and power w/c     OT Treatment Recommendations :   Pt continues to req skilled OT services to increase strength, endurance, ADL's, and mobility.      WATERS, OT, MANDI R - 04/11/2016 12:29 EDT   Time Spent With Patient   OT Time In :   10:00 EST   OT Time Out :   11:30 EST   OT Attended E-Stim Time :   60 minutes   OT Attended  E-Stim Units :   4 units   OT ADL TRAINING 15 MIN :   2    OT ADL Training Minutes :   30 minutes   OT Total Individual Therapy Time :   90 minutes   OT Total Timed Code Treatment Units :   6 units   OT Total Timed Code Treatment Minutes :   90 minutes   OT Total Treatment Time Rehab :   90 minutes   WATERS, OT, MANDI R - 04/11/2016 12:29 EDT

## 2016-04-11 NOTE — Case Communication (Signed)
 CM Discharge Planning Assessment - Text       CM Discharge Planning Ongoing Assessment Entered On:  04/11/2016 19:05 EDT    Performed On:  04/11/2016 19:05 EDT by GREG LLANOS C               Discharge Needs I   Previously Documented Discharge Needs :   DISCHARGE PLAN/NEEDS:  Anticipated Discharge Date: 04/17/2016 - GREG LLANOS BROCKS - 04/11/16 10:59:00  Discharge To, Anticipated: Home with family support, Home with home health - GREG LLANOS BROCKS - 04/11/16 10:59:00  EQUIPMENT/TREATMENT NEEDS:       Previously Documented Benefits Information :   Performed By: Karen Janann NOVAK  - 03/28/16 11:44:00       Anticipated Discharge Date :   04/17/2016 EDT   Anticipated Discharge Time Slot :   1000-1200   Discharge To :   Home with family support, Home with home health   Home Caregiver Name/Relationship :   Ricky Doan  daughter- beth   CM Progress Note :   04/01/2016  SW went to speak with Pt several times this day, but she was always indisposed - SW able to meet 1:1 with spouse.  SW presented the [facility]- Summary.  Pt's spouse does not feel the anticipated D/C date will be met, as far as, Pt making proposed goals.  Spouse would like to see Pt make it to as close to Mod I with transfers as possible.  Additionally, Spouse would like to hold off presenting D/C date, due to potential of upsetting Pt.  Pt seems to be struggling with loss of independence and ability.  Please note that Pt has been in/out of hospitals since June.  Next, she has not gotten therapy in the past 3-4 weeks.  Spouse concerned about endurance and muscle loss.  Spouse requested assistance with release of information from Dr. Arlyss in [facility] in [place]  --> send clinical(s) to Dr. Dewane with [facility] (faxed releases).  SW will continue to assist/prn.  vcd     04/08/2016  SW went to relay ITC-Summary to Pt. Pt requested that I return when she has her husbanc.  SW will follow up & continue to assist/prn.     04/09/2016  SW  went to meet with Pt & her spouse.  Family would like to explore sub-acute facilities that have a community with level of services & one that has a golf course close by for spouse.  SW will put together local regressive/progressive communities in the [place].  SW will continue to assist/prn.  vcd    04/11/2016  SW was informed by Dr. Stanly that Dr. Brendan will need to see Pt in his office.  SW called Dr. Brendan - Urology:  [facility] [phone]) and spoke with Dorthea who recieved info, but relayed that she would have [place], RN call SW back.  SW will continue to assist/prn. vcd       DEANTONIO,  VELIA C - 04/11/2016 19:05 EDT

## 2016-04-11 NOTE — Progress Notes (Signed)
Functional Indep Measure Scores - Text       Functional Independence Measure Scores Entered On:  04/11/2016 20:19 EDT    Performed On:  04/11/2016 10:20 EDT by Mitchum, RN, Christina L               Eating Score   Patient's Independence Level With Eating Tasks :   Independent/Modified independence   Independent or Modifications Needed :   Complete independence   Functional Independence Measure Eating :   Complete independence   Mitchum, RN, Elvin SoChristina L - 04/11/2016 20:17 EDT   Toileting Score   Patient's Independence Level with Toileting Tasks :   Does not occur   Toileting :   Does not occur   Mitchum, RN, Elvin SoChristina L - 04/11/2016 20:17 EDT   Bladder Management Score   Patient's Independence Level with Bladder Management Tasks :   Assistance   Type of Assistance Necessary :   Helper changes linen or clothing/cleans up spill, Helper changes diaper or absorbent pad   Functional Independence Measure Bladder Management :   Total assistance   Mitchum, RN, Elvin SoChristina L - 04/11/2016 20:17 EDT   Bowel Management Score   Patient's Independence Level with Bowel Management Tasks :   Assistance   Type of Assistance Necessary :   Helper changes linen or clothing/cleans up spill, Helper changes diaper   Functional Independence Measure Bowel Management :   Total assistance   Mitchum, RN, Elvin SoChristina L - 04/11/2016 20:17 EDT   Transfer Bed/Chair/WC Score   Patient's independence Level with Bed, Chair, Wheelchair Tasks :   Assistance   Type of Assistance Necessary :   Two helpers needed, Total assist (patient performs less than 25%)   Bed, Chair, Wheelchair Transfer :   Total assistance   Mitchum, RN, Elvin SoChristina L - 04/11/2016 20:17 EDT   Transfer Toilet Score   Patient's Independence Level with Transfer Toilet Tasks :   Does not occur   Transfer Toilet :   Does not occur   Mitchum, RN, Christina L - 04/11/2016 20:17 EDT

## 2016-04-11 NOTE — Case Communication (Signed)
 CM Discharge Planning Assessment - Text       CM Discharge Planning Ongoing Assessment Entered On:  04/11/2016 11:04 EDT    Performed On:  04/11/2016 10:59 EDT by GREG LLANOS C               Discharge Needs I   Previously Documented Discharge Needs :   DISCHARGE PLAN/NEEDS:  Anticipated Discharge Date: 04/17/2016 - GREG LLANOS BROCKS - 04/09/16 11:22:00  Discharge To, Anticipated: Home with family support, Home with home health - GREG LLANOS BROCKS - 04/09/16 11:22:00  EQUIPMENT/TREATMENT NEEDS:       Previously Documented Benefits Information :   Performed By: Karen Janann NOVAK  - 03/28/16 11:44:00       Anticipated Discharge Date :   04/17/2016 EDT   Anticipated Discharge Time Slot :   1000-1200   Discharge To :   Home with family support, Home with home health   Home Caregiver Name/Relationship :   Genia Perin  daughter- beth   CM Progress Note :   04/01/2016  SW went to speak with Pt several times this day, but she was always indisposed - SW able to meet 1:1 with spouse.  SW presented the [facility]- Summary.  Pt's spouse does not feel the anticipated D/C date will be met, as far as, Pt making proposed goals.  Spouse would like to see Pt make it to as close to Mod I with transfers as possible.  Additionally, Spouse would like to hold off presenting D/C date, due to potential of upsetting Pt.  Pt seems to be struggling with loss of independence and ability.  Please note that Pt has been in/out of hospitals since June.  Next, she has not gotten therapy in the past 3-4 weeks.  Spouse concerned about endurance and muscle loss.  Spouse requested assistance with release of information from Dr. Arlyss in [facility] in [place]  --> send clinical(s) to Dr. Dewane with [facility] (faxed releases).  SW will continue to assist/prn.  vcd     04/08/2016  SW went to relay ITC-Summary to Pt. Pt requested that I return when she has her husbanc.  SW will follow up & continue to assist/prn.     04/09/2016  SW  went to meet with Pt & her spouse.  Family would like to explore sub-acute facilities that have a community with level of services & one that has a golf course close by for spouse.  SW will put together local regressive/progressive communities in the [place] area.  SW will continue to assist/prn.  vcd    04/11/2016  SW was informed by Dr. Stanly that Dr. Brendan will need to see Pt in his office.  SW called Dr. Brendan - Urology:  [facility] [phone]) and spoke with Dorthea who recieved info, but relayed that she would have [place], RN call SW back.  SW will continue to assist/prn. vcd       DEANTONIO,  VELIA C - 04/11/2016 10:59 EDT

## 2016-04-11 NOTE — Progress Notes (Signed)
Functional Indep Measure Scores - Text       Functional Independence Measure Scores Entered On:  04/11/2016 12:14 EDT    Performed On:  04/11/2016 12:13 EDT by Kevan Ny PTA, Melynda Ripple               Transfer Bed/Chair/WC Score   Patient's independence Level with Bed, Chair, Wheelchair Tasks :   Assistance   Type of Assistance Necessary :   Assistance for more than half (patient performs 25% - 49% of tasks)   Bed, Chair, Wheelchair Transfer :   Maximal assistance   Alger Memos - 04/11/2016 12:13 EDT   Comprehension Score   Mode of Comprehension :   Auditory   Comprehends Complex or Abstract Information Without Prompting or Cueing :   Yes   Understands Complex or Abstract Directions and Conversations :   At all times   Comprehension Indep Measure Interim :   Complete independence   GATES, PTA, Melynda Ripple - 04/11/2016 12:13 EDT   Expression Score   Expression Mode :   Vocal   Expresses Complex or Abstract Information Without Prompting or Cueing :   Yes   Expresses Complex or Abstract Ideas :   Clearly and fluently at all times   Expression Indep Measure Interim :   Complete independence   Alger Memos - 04/11/2016 12:13 EDT   Social Interaction Score   Interacts Appropriately Without Supervision :   Yes   Interacts Appropriately :   At all times   Social Interaction Indep Measure Interim :   Complete independence   Alger Memos - 04/11/2016 12:13 EDT   Problem Solving Score   Solves Complex Problems :   Yes   Ability to Solve Complex Problems :   Consistently solves problems independently   Problem Solving Indep Measure Interim :   Complete independence   Alger Memos - 04/11/2016 12:13 EDT   Memory Score   Recognizes, Remembers Routines, and Executes Requests Without Prompting :   Yes   Remembers and Executes Requests :   Consistently without need for repetition   Memory Indep Measure Interim :   Complete independence   Alger Memos - 04/11/2016 12:13 EDT

## 2016-04-11 NOTE — Progress Notes (Signed)
Functional Indep Measure Scores - Text       Functional Independence Measure Scores Entered On:  04/11/2016 12:29 EDT    Performed On:  04/11/2016 10:00 EDT by WATERS, OT, MANDI R               Toileting Score   Patient's Independence Level with Toileting Tasks :   Assistance   Type of Assistance Necessary :   Assistance with toileting tasks   Amount of Assistance Needed :   Adjusting clothing before toileting, Adjusting clothing after toileting, Cleansing perineal area   Toileting :   Total assistance   WATERS, OT, MANDI R - 04/11/2016 12:28 EDT   Transfer Toilet Score   Patient's Independence Level with Transfer Toilet Tasks :   Assistance   Amount of Assistance Necessary :   Total assist (patient performs less than 25%)   Transfer Toilet :   Total assistance   WATERS, OT, MANDI R - 04/11/2016 12:28 EDT   Comprehension Score   Mode of Comprehension :   Auditory   Comprehends Complex or Abstract Information Without Prompting or Cueing :   Yes   Understands Complex or Abstract Directions and Conversations :   At all times   Comprehension Indep Measure Interim :   Complete independence   WATERS, OT, MANDI R - 04/11/2016 12:28 EDT   Expression Score   Expression Mode :   Vocal   Expresses Complex or Abstract Information Without Prompting or Cueing :   Yes   Expresses Complex or Abstract Ideas :   Clearly and fluently at all times   Expression Indep Measure Interim :   Complete independence   WATERS, OT, MANDI R - 04/11/2016 12:28 EDT   Social Interaction Score   Interacts Appropriately Without Supervision :   Yes   Interacts Appropriately :   At all times   Social Interaction Indep Measure Interim :   Complete independence   WATERS, OT, MANDI R - 04/11/2016 12:28 EDT   Problem Solving Score   Solves Complex Problems :   Yes   Ability to Solve Complex Problems :   Consistently solves problems independently   Problem Solving Indep Measure Interim :   Complete independence   WATERS, OT, MANDI R - 04/11/2016 12:28 EDT   Memory  Score   Recognizes, Remembers Routines, and Executes Requests Without Prompting :   Yes   Remembers and Executes Requests :   Consistently without need for repetition   Memory Indep Measure Interim :   Complete independence   WATERS, OT, MANDI R - 04/11/2016 12:28 EDT

## 2016-04-11 NOTE — Progress Notes (Signed)
Functional Indep Measure Scores - Text       Functional Independence Measure Scores Entered On:  04/11/2016 23:28 EDT    Performed On:  04/11/2016 23:28 EDT by Azucena KubaEID, RN, VANESSA               Eating Score   Patient's Independence Level With Eating Tasks :   Does not occur   Functional Independence Measure Eating :   Does not occur   REID, RN, VANESSA - 04/11/2016 23:28 EDT   Toileting Score   Patient's Independence Level with Toileting Tasks :   Does not occur   Toileting :   Does not occur   REID, RN, Erie NoeVANESSA - 04/11/2016 23:28 EDT   Bladder Management Score   Patient's Independence Level with Bladder Management Tasks :   Assistance   Type of Assistance Necessary :   Helper changes diaper or absorbent pad, Helper initiates timed voiding schedule   Functional Independence Measure Bladder Management :   Total assistance   REID, RN, VANESSA - 04/11/2016 23:28 EDT   Bowel Management Score   Patient's Independence Level with Bowel Management Tasks :   Independent/Modified independence   InM Bowel Mgmt Indep or Modifications :   Requires medication at least once a week   Functional Independence Measure Bowel Management :   Modified independence   Azucena KubaREID, RN, [place] NoeVANESSA - 04/11/2016 23:28 EDT   Transfer Bed/Chair/WC Score   Patient's independence Level with Bed, Chair, Wheelchair Tasks :   Does not occur   Bed, Chair, Wheelchair Transfer :   Does not occur   Azucena KubaREID, RN, [place] NoeVANESSA - 04/11/2016 23:28 EDT   Transfer Toilet Score   Patient's Independence Level with Transfer Toilet Tasks :   Does not occur   Transfer Toilet :   Does not occur   Azucena KubaREID, RN, [place] NoeVANESSA - 04/11/2016 23:28 EDT

## 2016-04-12 NOTE — Progress Notes (Signed)
Functional Indep Measure Scores - Text       Functional Independence Measure Scores Entered On:  04/12/2016 13:07 EDT    Performed On:  04/12/2016 7:30 EDT by WATERS, OT, MANDI R               Eating Score   Patient's Independence Level With Eating Tasks :   Independent/Modified independence   Independent or Modifications Needed :   Complete independence   Functional Independence Measure Eating :   Complete independence   WATERS, OT, MANDI R - 04/12/2016 13:02 EDT   Grooming Score   Patient's Independence Level with Grooming Tasks :   Independent/Modified independence   InM Grooming Indep or Modifications :   Needs more than reasonable time   Grooming :   Modified independence   WATERS, OT, MANDI R - 04/12/2016 13:02 EDT   Bathing Score   Patient's Independence Level with Bathing Tasks :   Assistance   Type of Assistance Necessary :   Assistance with bathing body parts   Body Parts Assessed :   All body surfaces   Tasks Requiring Assistance :   Buttocks, Lower leg and foot, left, Lower leg and foot, right   Functional Independence Measure Bathing :   Moderate assistance   WATERS, OT, MANDI R - 04/12/2016 13:02 EDT   Upper Body Dressing Score   Patient's Independence Level with Upper Body Dressing Tasks :   Assistance   Type of Assistance Necessary :   Assistance with dressing tasks   Tasks Assessed :   Thread/Unthread right sleeve, Thread/Unthread left sleeve, Pull/Remove head through neckline, Pull/Remove over trunk   Tasks Requiring Assistance :   Thread/Unthread right sleeve, Pull/Remove over trunk   UE Dressing :   Moderate assistance   WATERS, OT, MANDI R - 04/12/2016 13:02 EDT   Lower Body Dressing Score   Patient's independence Level with Lower Body Dressing Tasks :   Assistance   Type of Assistance Necessary :   Assistance with dressing tasks   Tasks Assessed :   All dressing tasks   Tasks Requiring Assistance :   All dressing tasks   LE Dressing :   Total assistance   WATERS, OT, MANDI R - 04/12/2016 13:02 EDT    Toileting Score   Patient's Independence Level with Toileting Tasks :   Assistance   Type of Assistance Necessary :   Assistance with toileting tasks   Amount of Assistance Needed :   Adjusting clothing before toileting, Adjusting clothing after toileting, Cleansing perineal area   Toileting :   Total assistance   WATERS, OT, MANDI R - 04/12/2016 13:02 EDT   Transfer Bed/Chair/WC Score   Patient's independence Level with Bed, Chair, Wheelchair Tasks :   Assistance   Type of Assistance Necessary :   Total assist (patient performs less than 25%)   Bed, Chair, Wheelchair Transfer :   Total assistance   WATERS, OT, MANDI R - 04/12/2016 13:02 EDT   Transfer Toilet Score   Patient's Independence Level with Transfer Toilet Tasks :   Assistance   Amount of Assistance Necessary :   Total assist (patient performs less than 25%)   Transfer Toilet :   Total assistance   WATERS, OT, MANDI R - 04/12/2016 13:02 EDT   Transfer Shower Score   Patient's independence Level with Transfer Shower Tasks :   Assistance   Type of Assistance Necessary :   Total assist (patient performs less than 25%)  Shower Transfer :   Total assistance   WATERS, OT, MANDI R - 04/12/2016 13:02 EDT   Comprehension Score   Mode of Comprehension :   Auditory   Comprehends Complex or Abstract Information Without Prompting or Cueing :   Yes   Understands Complex or Abstract Directions and Conversations :   At all times   Comprehension Indep Measure Interim :   Complete independence   WATERS, OT, MANDI R - 04/12/2016 13:02 EDT   Expression Score   Expression Mode :   Vocal   Expresses Complex or Abstract Information Without Prompting or Cueing :   Yes   Expresses Complex or Abstract Ideas :   Clearly and fluently at all times   Expression Indep Measure Interim :   Complete independence   WATERS, OT, MANDI R - 04/12/2016 13:02 EDT   Social Interaction Score   Interacts Appropriately Without Supervision :   Yes   Interacts Appropriately :   At all times   Social  Interaction Indep Measure Interim :   Complete independence   WATERS, OT, MANDI R - 04/12/2016 13:02 EDT   Problem Solving Score   Solves Complex Problems :   Yes   Ability to Solve Complex Problems :   Consistently solves problems independently   Problem Solving Indep Measure Interim :   Complete independence   WATERS, OT, MANDI R - 04/12/2016 13:02 EDT   Memory Score   Recognizes, Remembers Routines, and Executes Requests Without Prompting :   Yes   Remembers and Executes Requests :   Consistently without need for repetition   Memory Indep Measure Interim :   Complete independence   WATERS, OT, MANDI R - 04/12/2016 13:02 EDT

## 2016-04-12 NOTE — Progress Notes (Signed)
Functional Indep Measure Scores - Text       Functional Independence Measure Scores Entered On:  04/12/2016 22:30 EDT    Performed On:  04/12/2016 22:29 EDT by Elmer RampSIOSON, RN, MARIA NORLIS               Bladder Management Score   Patient's Independence Level with Bladder Management Tasks :   Assistance   Type of Assistance Necessary :   Helper changes linen or clothing/cleans up spill, Helper changes diaper or absorbent pad   Functional Independence Measure Bladder Management :   Total assistance   SIOSON, RN, MARIA NORLIS - 04/12/2016 22:29 EDT   Bowel Management Score   Patient's Independence Level with Bowel Management Tasks :   Assistance   Type of Assistance Necessary :   Helper changes linen or clothing/cleans up spill, Helper changes diaper   Functional Independence Measure Bowel Management :   Total assistance   SIOSON, RN, MARIA NORLIS - 04/12/2016 22:29 EDT   Transfer Bed/Chair/WC Score   Patient's independence Level with Bed, Chair, Wheelchair Tasks :   Assistance   Type of Assistance Necessary :   Two helpers needed   Bed, Chair, Wheelchair Transfer :   Total assistance   SIOSON, RN, MARIA NORLIS - 04/12/2016 22:29 EDT

## 2016-04-12 NOTE — Progress Notes (Signed)
Functional Indep Measure Scores - Text       Functional Independence Measure Scores Entered On:  04/12/2016 13:09 EDT    Performed On:  04/12/2016 13:07 EDT by Kyung RuddKENNEDY, RN, MANUVETTE E               Eating Score   Patient's Independence Level With Eating Tasks :   Setup/Supervision   Supervision or Setup Needed :   Cueing, Cut food, Gather equipment, Open containers   Functional Independence Measure Eating :   Supervision or setup   KENNEDY, RN, MANUVETTE E - 04/12/2016 13:07 EDT   Bladder Management Score   Patient's Independence Level with Bladder Management Tasks :   Assistance   Type of Assistance Necessary :   Helper positions and holds bedpan, assists patient to roll on AND off the bedpan, Helper changes linen or clothing/cleans up spill   Functional Independence Measure Bladder Management :   Total assistance   KENNEDY, RN, MANUVETTE E - 04/12/2016 13:07 EDT   Bowel Management Score   Patient's Independence Level with Bowel Management Tasks :   Assistance   Type of Assistance Necessary :   Helper changes linen or clothing/cleans up spill   Functional Independence Measure Bowel Management :   Total assistance   Kyung RuddKENNEDY, RN, MANUVETTE E - 04/12/2016 13:07 EDT   Transfer Toilet Score   Patient's Independence Level with Transfer Toilet Tasks :   Assistance   Amount of Assistance Necessary :   Two helpers needed, Mechanical lift required   Transfer Toilet :   Total assistance   Kyung RuddKENNEDY, RN, MANUVETTE E - 04/12/2016 13:07 EDT

## 2016-04-12 NOTE — Progress Notes (Signed)
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 following UTI and C-diff    3 hrs/day     *Chief Complaint :   weakness bilat LE's, tightness R ankle/calf     CLINE, PT, [facility] K - 04/12/2016 16:25 EDT   Review/Treatments Provided   PT Goals :   PT Short Term Goals    04/12/2016  Bed Mobility Goal #1: Roll to right and left; Mod A; Progressing, continue  Transfer Goal #1: Transfer board; Mod A; Progressing, continue  Wheelchair Management Goal #3: Power wheelchair mobility indoors; Setup, Distant S; Initial  Wheelchair Management Goal #4: Pressure relief tilt in space (power wheelchair); Mod I; Initial  Balance Goal #1: Unsupported short sit; Mod A; Static sitting; 2; Improve independence with activities of daily living; Progressing, continue     PT Plan :   Treatment Frequency:  Daily (modified)   Performed By: Eliseo Gum, PT, AMBER R  03/30/2016 09:30  Treatment Duration: 3 Performed By: Carney Living, AMBER R  03/30/2016 09:30  Planned Treatments: Balance training, Basic activities of daily living, Bed mobility training, Caregiver training, Electrical stimulation, Equipment training, Functional training, Manual therapy, Neuromuscular reeducation, Pain management, Posture/Body mechanics training,... Performed By: Carney Living, AMBER R  03/30/2016 09:30     Short Term Goals Reviewed :   Yes   Physical Therapy Orders :   Physical Therapy Inpatient Additional Treatment Rehab - 03/30/16 11:39:51 EDT, Balance training, Basic activities of  daily living, Bed mobility training, Caregiver training, Electrical stimulation, Equipment training, Functional training, Manual therapy, Neuromuscular reeducation, Pain management, Posture/...  PT FIMS - 03/29/16 19:38:00 EDT, Daily     Pain Present :   Yes actual or suspected pain   PT Therapeutic Activity,Mobility,Balance :   Yes   CLINE, PT, SARAH K - 04/12/2016 16:25 EDT   Pain Assessment   Pain Location :   Arm   Laterality :   Left   Quality :   Radiating   Time Pattern :   Constant   Onset :   Gradual   Duration :   LUE radiating pain   Pain Negatively Impacts :   Concentration, Daily life   Self Report Pain :   FACES pain scale   FACES Pain Scale (ref) :   2 = Hurts little bit   FACES Pain Score :   2    CLINE, PT, SARAH K - 04/12/2016 16:25 EDT   Therapeutic Activities/Mobility/Balance   Functional Activity :   Therapeutic Activities  Activity 1:  Transfer training; Lateral transfer with use of slideboard from power wc<>plinth with Max A 2* tetraplegia and dec balance       Performed Date:  04/12/2016  Activity 2:  Dynamic sitting balance; While sitting EOM pt tossed ball back and forth with therapist, unable to catch unless straight on due to  decreased UE ROM and strength       Performed Date:  04/12/2016  Activity 3:  Balance; CGA; pt sitting up on EOB working on twisting at the waist looking from side to side, then shifting weight from side to side, and then raising B hands clasped and raising hands up/down 15 reps working on sitting balance, strength, and endurance.       Performed Date:  04/10/2016  Activity 4:  Transitional movement; lateral propped on L elbow to encourage appropriate trunk righting  and improve trunk ROM to L sidebending. Balance on L propped elbow with close SPV and VC for use of head/shoulders to control balance position.       Performed Date:  04/02/2016     Gillermo MurdochCLINE, PT, SARAH K - 04/12/2016 16:25 EDT   PT Therapeutic Activities Grid     Activity 1  Activity 2        Activity :    Bed  mobility   Transfer training           Assist :    Maximal assistance   Maximal assistance           Comment :    Pt required assist for BLE positioning and righting trunk. Pt talked through each step and problem solving instruction for assist w/max therapist cues.    Pt completed R lateral scoot t/f w/transfer board and max A- problem solving instructions for instructing care w/max therapist cues.              [facility] - 04/12/2016 16:25 EDT  [facility] - 04/12/2016 16:25 EDT        Reassess Mobility :   Yes   CLINE, PT, SARAH K - 04/12/2016 16:25 EDT   PT Mobility   Mobility Grid   Roll Left :   Rehab Maximal assistance   Roll Right :   Rehab Total assistance   Roll Supine :   Rehab Total assistance   Supine to Sit :   Rehab Maximal assistance   Sit to Supine :   Rehab Maximal assistance   Scooting :   Rehab Total assistance   Transfer Bed to and From Chair :   Rehab Maximal assistance   Transfer Toilet :   Total assistance   CLINE, PT, [facility] FretSARAH K - 04/12/2016 16:25 EDT   Functional Mobility Details :   slide board with use of maxi slide   CLINE, PT, SARAH K - 04/12/2016 16:25 EDT   Amb Ability Varied Surf/Distraction Grid   Level Surfaces :   Does not occur   Uneven Surfaces :   Does not occur   Distracting Environments :   Does not occur   Curbs :   Does not occur   Stairs :   Does not occur   Ramp :   Does not occur   MearsLINE, PT, [facility] - 04/12/2016 16:25 EDT   Transfer Type :   Transfer board   PT Mobility Reviewed :   Yes   CLINE, PT, SARAH K - 04/12/2016 16:25 EDT   Short Term Goals   Bed Mobility Goal Grid     Goal #1          Descriptors :    Roll to right and left              Level :    Moderate assistance  Status :    Progressing, continue                CLINE, PT, [facility] K - 04/12/2016 16:25 EDT         Transfers Goal Grid     Goal #1          Descriptors :    Transfer board              Level :    Moderate assistance              Status :    Progressing, continue                 CLINE, PT, SARAH K - 04/12/2016 16:25 EDT         W/C Management Grid     Goal #1  Goal #2  Goal #3  Goal #4    Descriptors :    Power wheelchair mobility indoors   Pressure relief tilt in space (power wheelchair)   Power wheelchair mobility indoors   Pressure relief tilt in space (power wheelchair)     Level :    Minimal assistance   Minimal assistance   Setup, Distant supervision   Modified independence     Status :    Goal met   Goal met   Initial   Initial     Date Met :    04/06/2016 EDT   04/06/2016 EDT             CLINE, PT, [facility] K - 04/12/2016 16:25 EDT  CLINE, PT, SARAH K - 04/12/2016 16:25 EDT  CLINE, PT, SARAH K - 04/12/2016 16:25 EDT  CLINE, PT, SARAH K - 04/12/2016 16:25 EDT      PT Balance Goal Grid     Goal #1          Descriptor :    Unsupported short sit              Assist Level :    Moderate assistance              Type :    Static sitting              Length of Time (minutes) :    2 minutes              Rationale :    Improve independence with activities of daily living              Status :    Progressing, continue                CLINE, PT, SARAH K - 04/12/2016 16:25 EDT         PT ST Goals Reviewed :   Yes   CLINE, PT, SARAH K - 04/12/2016 16:25 EDT   Assessment   PT Impairments or Limitations :   Abnormal tone, Balance deficits, Bed mobility deficits, Coordination/Proprioception deficits, Endurance deficits, Equipment training, Impaired sensation, Range of motion deficits, Strength deficits, Transfer deficits, Transition deficits, Wheelchair mobility deficits   Barriers to Safe Discharge PT :   Medical diagnosis   Discharge Recommendations :   ELOS: 3 weeks; home with assist and PWC     PT Treatment Recommendations :   Pt requiring max cues for instructing care despite focus of previous therapy sessions. Pt provided handouts for review and encouraged to study for improved indep. w/instructing assist. will cont. to benefit from skilled PT.  CLINE, PT, [place] K - 04/12/2016 16:25 EDT   Time Spent With  Patient   PT Time In :   14:00 EST   PT Time Out :   15:15 EST   PT ADL TRAINING 15 MN :   5 units   PT ADL Training Time :   75 minutes   PT Total Individual Therapy Time :   75 minutes   PT Total Timed Code Treatment Units :   5 units   PT Total Timed Code Tx Minutes :   75 minutes   PT Total Treatment Time Rehab :   75 minutes   CLINE, PT, SARAH K - 04/12/2016 16:25 EDT

## 2016-04-12 NOTE — Progress Notes (Signed)
Functional Indep Measure Scores - Text       Functional Independence Measure Scores Entered On:  04/12/2016 16:23 EDT    Performed On:  04/12/2016 16:23 EDT by Alberteen SpindleLINE, PT, [name]               Transfer Bed/Chair/WC Score   Patient's independence Level with Bed, Chair, Wheelchair Tasks :   Assistance   Type of Assistance Necessary :   Assistance for more than half (patient performs 25% - 49% of tasks)   Bed, Chair, Wheelchair Transfer :   Maximal assistance   DixonLINE, PT, [name] - 04/12/2016 16:23 EDT   Comprehension Score   Mode of Comprehension :   Auditory   Comprehends Complex or Abstract Information Without Prompting or Cueing :   Yes   Understands Complex or Abstract Directions and Conversations :   At all times   Comprehension Indep Measure Interim :   Complete independence   CLINE, [facility], [name] - 04/12/2016 16:23 EDT   Expression Score   Expression Mode :   Vocal   Expresses Complex or Abstract Information Without Prompting or Cueing :   Yes   Expresses Complex or Abstract Ideas :   Mild difficulty   Expression Indep Measure Interim :   Modified independence   CLINE, PT, SARAH [name] - 04/12/2016 16:23 EDT   Social Interaction Score   Interacts Appropriately Without Supervision :   Yes   Interacts Appropriately :   At all times   Social Interaction Indep Measure Interim :   Complete independence   CLINE, PT, An[facility] FretSARAH [name] - 04/12/2016 16:23 EDT   Problem Solving Score   Solves Complex Problems :   No   Ability to Solve Routine Problems :   Standby, 90 percent, requires prompting 10 percent or less   Problem Solving Indep Measure Interim :   Standby prompting   CLINE, PT, An[facility] FretSARAH [name] - 04/12/2016 16:23 EDT   Memory Score   Recognizes, Remembers Routines, and Executes Requests Without Prompting :   No   Remembers and Executes Requests With Prompting :   Standby, 90 percent, requires prompting 10 percent or less   Memory Indep Measure Interim :   Standby prompting   CL[facility], [facility], [name] - 04/12/2016 16:23 EDT

## 2016-04-12 NOTE — Progress Notes (Signed)
OT Inpatient Daily Documentation - Text       OT Inpatient Daily Documentation Entered On:  04/12/2016 13:10 EDT    Performed On:  04/12/2016 7:30 EDT by WATERS, OT, MANDI R               Reason for Treatment   *Reason for Referral :   Admitted for MS exacerbation    82 yo female with past medical history of progressive MS diagnosed in 1989, CVA with right sided weakness, hypothyroidism and neurogenic bladder admitted to [facility] in [place], [place] on 03/06/16 with recurrent generalized weakness of the upper and lower extremities and poor posture. She has been wheelchair bound for the past few years due to right-sided weakness.    (per pt, she did not have a CVA)     *Chief Complaint :   weakness     WATERS, OT, MANDI R - 04/12/2016 13:07 EDT   Review/Treatments Provided   OT Goals :   OT Short Term Goals    04/08/2016  Eating Goal #1: Manage food containers/packages; Distant S; Progressing, continue  Upper Body Dressing Goal #2: Upper extremity dressing; Minimal assistance; Initial  Lower Body Dressing Goal #1: Lower extremity dressing; Supine, Supported short sit, Unsupported short sit; Max A; Progressing, continue  Toileting and Transfers Goal #1: Toilet transfers; Max A; Bedside commode; Drop arm commode, Elevated toilet seat, Grab bars, Sliding board; Progressing, continue  Toileting and Transfers Goal #2: Toileting; Max A; Drop arm commode, Elevated toilet seat, Grab bars, Sliding board; Progressing, continue  Balance Goal #1: Unsupported short sit; Static sitting; CGA; 5; Improve independence with activities of daily living; Initial     OT Plan :   Treatment Frequency: Daily Performed By: Carilyn Goodpasture M   03/30/2016  Treatment Duration: 3 Performed By: Katherine Roan   03/30/2016  Planned Treatments: Balance training, Basic Activities of Daily Living, Caregiver training, Coordination, Energy conservation training, Equipment training, Group therapy, Mobility training, Patient education, Safety  education, Therapeutic activities, Therapeutic exercises... Performed By: Katherine Roan   03/30/2016     Occupational Therapy Orders :   Occupational Therapy Inpatient Additional Treatment Rehab - 03/30/16 16:40:41 EDT, Balance training, Basic Activities of Daily Living, Caregiver training, Coordination, Energy conservation training, Equipment training, Group therapy, Mobility training, Patient education, Safety education, Therapeutic activitie...  OT FIMS - 03/29/16 19:38:00 EDT, Daily     Pain Present :   No actual or suspected pain   OT Therapeutic Activity,Mobility,Balance :   Yes   WATERS, OT, MANDI R - 04/12/2016 13:07 EDT   Therapeutic Activities   OT Therapeutic Activities RTF :   Therapeutic Activities  Activity 1:  Pt completed toilet transfer and toileting with total A,       Performed Date:  04/11/2016  Activity 2:  Reaching anteriorly, Other: core strengthening/dynamic sitting balance; Mod A; Sitting EOM, pt anteriorly reached to touch ball to ground using BUEs for 5 reps x 2 sets with Min A for control for trunk flexion and Mod A for trunk extension to come to upright sitting posture.  Task to incr core strength/dynamic sitting balance       Performed Date:  04/10/2016  Activity 3:  Transitional movement; Mod A; Sitting in wheelchair, Supported short sit; Improved stability in muscle group(s), Improved timing, control, and coordination, Increased activation of targeted muscle group(s), Integrated muscular activation into functional activity; pt performed lat scoot transfer with gait belt around hips and sliding  board from w/c <> EOM x2.  Pt's husband performed trasnfers 1x with CGA from OT after ed.       Performed Date:  04/09/2016  Activity 4:  Other: Dynamic sitting balance; Minimal assistance; Supported short sit, Unsupported short sit; Other: Ball/bean bags; Improved stability in muscle group(s), Improved timing, control, and coordination, Increased activation of targeted muscle group(s),  Integrated muscular activation into functional activity; Pt performed anterior reaching to place bean bags into container on table top with occassional assist to maintain balance  when reachign with RUE.  Task to increase dynamic sitting balance needed for greater I with transfers and ADL performance.       Performed Date:  04/09/2016  Activity 5:  Improved stability in muscle group(s), Improved timing, control, and coordination, Increased activation of targeted muscle group(s), Integrated muscular activation into functional activity       Performed Date:  04/09/2016     Reassess Activities of Daily Living :   Yes   WATERS, OT, MANDI R - 04/12/2016 13:07 EDT   OT Basic ADL   Basic ADL Grid   Bathing :   Moderate assistance   UE Dressing :   Moderate assistance   LE Dressing :   Total assistance   Toileting :   Total assistance   Transfer Toilet :   Total assistance   Tub Transfer :   Does not occur   Shower Transfer :   Total assistance   WATERS, OT, MANDI R - 04/12/2016 13:07 EDT   Eating :   Modified independence   Grooming :   Modified independence   WATERS, OT, MANDI R - 04/12/2016 13:32 EDT   ADL Comments :   ADL 10/6-Pt total A for all transfers. Pt completed bathing with mod A for bottom and B ft. Mod A for UB dressing for R UE and pulling down in back. Total A for LB dressing. Mod I for eating and grooming.      WATERS, OT, MANDI R - 04/12/2016 13:32 EDT   Assessment   OT Impairments or Limitations :   Balance deficits, Basic activity of daily living deficits, Coordination deficits, Endurance deficits, Equipment training, IADL deficits, Mobility deficits, Proprioception deficits, Safety awareness deficits, Strength deficits   Barriers to Safe Discharge OT :   Complicated medical history, Decreased communication, Limited family support, Limited social support, Medical diagnosis, Progressive nature of disease, Safety awareness, Severity of deficits   OT Discharge Recommendations :   ELOS 3 weeks then home with  spouse and power w/c     OT Treatment Recommendations :   Pt continues to req skilled OT services to increase strength, transfers, ADL's, endurance, and mobility.     WATERS, OT, MANDI R - 04/12/2016 13:07 EDT   Time Spent With Patient   OT Time In :   7:30 EST   OT Time Out :   9:00 EST   OT ADL TRAINING 15 MIN :   6    OT ADL Training Minutes :   90 minutes   OT Total Individual Therapy Time :   90 minutes   OT Total Timed Code Treatment Units :   6 units   OT Total Timed Code Treatment Minutes :   90 minutes   OT Total Treatment Time Rehab :   90 minutes   WATERS, OT, MANDI R - 04/12/2016 13:07 EDT

## 2016-04-12 NOTE — Progress Notes (Signed)
PT Inpatient Daily Documentation - Text       PT Inpatient Daily Documentation Entered On:  04/12/2016 12:14 EDT    Performed On:  04/12/2016 11:00 EDT by Jonny Ruiz, PTA, LORI C               Reason for Treatment   *Reason for Referral :   Progressive MS (dx 1989)  new weakness following UTI and C-diff    3 hrs/day     *Chief Complaint :   weakness bilat LE's, tightness R ankle/calf     FORTUNA, PTA, LORI C - 04/12/2016 12:10 EDT   Review/Treatments Provided   PT Goals :   PT Short Term Goals    04/11/2016  Bed Mobility Goal #1: Roll to right and left; Mod A; Progressing, continue  Transfer Goal #1: Transfer board; Mod A; Progressing, continue  Wheelchair Management Goal #3: Power wheelchair mobility indoors; Setup, Distant S; Initial  Wheelchair Management Goal #4: Pressure relief tilt in space (power wheelchair); Mod I; Initial  Balance Goal #1: Unsupported short sit; Mod A; Static sitting; 2; Improve independence with activities of daily living; Progressing, continue     PT Plan :   Treatment Frequency:  Daily (modified)   Performed By: Eliseo Gum, PT, AMBER R  03/30/2016 09:30  Treatment Duration: 3 Performed By: Carney Living, AMBER R  03/30/2016 09:30  Planned Treatments: Balance training, Basic activities of daily living, Bed mobility training, Caregiver training, Electrical stimulation, Equipment training, Functional training, Manual therapy, Neuromuscular reeducation, Pain management, Posture/Body mechanics training,... Performed By: Carney Living, AMBER R  03/30/2016 09:30     Short Term Goals Reviewed :   Yes   Physical Therapy Orders :   Physical Therapy Inpatient Additional Treatment Rehab - 03/30/16 11:39:51 EDT, Balance training, Basic activities of daily living, Bed mobility training, Caregiver training, Electrical stimulation, Equipment training, Functional training, Manual therapy, Neuromuscular reeducation, Pain management, Posture/...  PT FIMS - 03/29/16 19:38:00 EDT, Daily     Pain Present :   No actual or  suspected pain   PT Therapeutic Activity,Mobility,Balance :   Yes   PT Therapeutic Exercise :   Yes   FORTUNA, PTA, LORI C - 04/12/2016 12:10 EDT   Therapeutic Activities/Mobility/Balance   Functional Activity :   Therapeutic Activities  Activity 1:  Transfer training; Max A; Sliding board, Other: maxi slide; Decreased endurance, Demonstrated positive response to treatment, Improved stability in muscle group(s), Required rest breaks; pt c tx bed to wc to R, c SB.  pt c tx wc to bed to L and ret to wc to R c use of SB and maxi slide to asssit dec shear and friction. pt c max assit due to poor turnk, ue and le strength , dec body enduracen and dec sitting bal c vc for head hip movment       Performed Date:  04/11/2016  Activity 2:  Transfer training; Total A; Sliding board; sliding board transfer bed>WC x2 and WC><mat even surfaces 2 person maxA secondary to dec strength, coordination, and endurance.       Performed Date:  04/10/2016  Activity 3:  Balance; CGA; pt sitting up on EOB working on twisting at the waist looking from side to side, then shifting weight from side to side, and then raising B hands clasped and raising hands up/down 15 reps working on sitting balance, strength, and endurance.       Performed Date:  04/10/2016  Activity 4:  Transitional movement; lateral propped on  L elbow to encourage appropriate trunk righting  and improve trunk ROM to L sidebending. Balance on L propped elbow with close SPV and VC for use of head/shoulders to control balance position.       Performed Date:  04/02/2016     Vance Peper - 04/12/2016 12:10 EDT   PT Therapeutic Activities Grid     Activity 1  Activity 2        Activity :    Transfer training   Dynamic sitting balance           Comment :    Lateral transfer with use of slideboard from power wc<>plinth with Max A 2* tetraplegia and dec balance   While sitting EOM pt tossed ball back and forth with therapist, unable to catch unless straight on due to decreased  UE ROM and strength             FORTUNA, PTA, LORI C - 04/12/2016 12:10 EDT  FORTUNA, PTA, LORI C - 04/12/2016 12:10 EDT        Reassess Mobility :   Yes   FORTUNA, PTA, LORI C - 04/12/2016 12:10 EDT   PT Mobility   Mobility Grid   Roll Left :   Rehab Maximal assistance   Roll Right :   Rehab Total assistance   Roll Supine :   Rehab Total assistance   Supine to Sit :   Rehab Maximal assistance   Sit to Supine :   Rehab Maximal assistance   Scooting :   Rehab Total assistance   Transfer Bed to and From Chair :   Rehab Maximal assistance   Transfer Toilet :   Does not occur   FORTUNA, PTA, LORI C - 04/12/2016 12:10 EDT   Functional Mobility Details :   slide board with use of maxi slide   FORTUNA, PTA, LORI C - 04/12/2016 12:10 EDT   Amb Ability Varied Surf/Distraction Grid   Level Surfaces :   Does not occur   Uneven Surfaces :   Does not occur   Distracting Environments :   Does not occur   Curbs :   Does not occur   Stairs :   Does not occur   Ramp :   Does not occur   FORTUNA, PTA, LORI C - 04/12/2016 12:10 EDT   Transfer Type :   Transfer board   PT Mobility Reviewed :   Yes   FORTUNA, PTA, LORI C - 04/12/2016 12:10 EDT   Therapeutic Exercise   Therapeutic Exercise RTF :   Therapeutic Exercise  Exercise 1:  Unsupported sit; pt perforemd in short sit on mat. atl between ant and post prop x 10 for sititng bal and turnk stability, forward flex and pushing back up to sit c bue for bue strength and turnk strength, x 10, Lat wt beaing on elbows x 8 reps atl sides.       Performed Date:  04/11/2016  Exercise 2:  shld retraction x 10.  APT strecth x 5 c 30 sec hold and lat trunk stretch to L x 5. pt performed all ex to assist c trunk and ue strength and rom to assist c cont progression and tol for fxn skilled act and to assist c progression of controled mobility       Performed Date:  04/11/2016  Exercise 3:  tilted back in power WC and reaching forward and grabing the therapists hand 2 sets of 5 reps c the  R UE, 10 reps c the  L UE active assisted; and then reaching c the L UE across the body 10 reps working on strengthening, coordination, and endurance.       Performed Date:  04/10/2016  Exercise 4:  sitting ankle pumps L LE for increasing ROM, strength, and endurance.       Performed Date:  04/10/2016     Vance Peper - 04/12/2016 12:10 EDT   Therapeutic Exercise Grid     Exercise 1          Exercise :    Core strengthening              Repetition/Time :    2 x 15              Comment :    While holding ball pt extended arms out in front and then laterally to B sides to increase core stabilty and balance.                 Les Pou, LORI C - 04/12/2016 12:10 EDT         Short Term Goals   Bed Mobility Goal Grid     Goal #1          Descriptors :    Roll to right and left              Level :    Moderate assistance              Status :    Progressing, continue                FORTUNA, PTA, LORI C - 04/12/2016 12:10 EDT         Transfers Goal Grid     Goal #1          Descriptors :    Transfer board              Level :    Moderate assistance              Status :    Progressing, continue                FORTUNA, PTA, LORI C - 04/12/2016 12:10 EDT         W/C Management Grid     Goal #1  Goal #2  Goal #3  Goal #4    Descriptors :    Power wheelchair mobility indoors   Pressure relief tilt in space (power wheelchair)   Power wheelchair mobility indoors   Pressure relief tilt in space (power wheelchair)     Level :    Minimal assistance   Minimal assistance   Setup, Distant supervision   Modified independence     Status :    Goal met   Goal met   Initial   Initial     Date Met :    04/06/2016 EDT   04/06/2016 EDT             FORTUNA, PTA, LORI C - 04/12/2016 12:10 EDT  FORTUNA, PTA, LORI C - 04/12/2016 12:10 EDT  FORTUNA, PTA, LORI C - 04/12/2016 12:10 EDT  FORTUNA, PTA, LORI C - 04/12/2016 12:10 EDT      PT Balance Goal Grid     Goal #1          Descriptor :    Unsupported short sit  Assist Level :    Moderate assistance               Type :    Static sitting              Length of Time (minutes) :    2 minutes              Rationale :    Improve independence with activities of daily living              Status :    Progressing, continue                FORTUNA, PTA, LORI C - 04/12/2016 12:10 EDT         PT ST Goals Reviewed :   Yes   FORTUNA, PTA, LORI C - 04/12/2016 12:10 EDT   Assessment   PT Impairments or Limitations :   Abnormal tone, Balance deficits, Bed mobility deficits, Coordination/Proprioception deficits, Endurance deficits, Equipment training, Impaired sensation, Range of motion deficits, Strength deficits, Transfer deficits, Transition deficits, Wheelchair mobility deficits   Barriers to Safe Discharge PT :   Medical diagnosis   Discharge Recommendations :   ELOS: 3 weeks; home with assist and PWC     PT Treatment Recommendations :   Continue skilled PT to increase functional mobility and independence.      Loralee PacasFORTUNA, PTA, LORI C - 04/12/2016 12:10 EDT   Time Spent With Patient   PT Time In :   11:00 EST   PT Time Out :   11:30 EST   PT ADL TRAINING 15 MN :   2 units   PT ADL Training Time :   30 minutes   PT Total Individual Therapy Time :   30 minutes   PT Total Timed Code Treatment Units :   2 units   PT Total Timed Code Tx Minutes :   30 minutes   PT Total Treatment Time Rehab :   30 minutes   FORTUNA, PTA, LORI C - 04/12/2016 12:10 EDT

## 2016-04-12 NOTE — Nursing Note (Signed)
Medication Administration Follow Up-Text       Medication Administration Follow Up Entered On:  04/12/2016 20:50 EDT    Performed On:  04/12/2016 20:50 EDT by Kyung RuddKENNEDY, RN, MANUVETTE E      Intervention Information:     tramadol  Performed by Nelda BucksSVENSEN, RN, Janna ArchLAUREN M on 04/12/2016 14:19:00 EDT       tramadol,25mg   Oral,mild pain (1-3) or temp > 100.5 F       Medication Effectiveness Evaluation   Medication Administration Reason :   Pain   Medication Effective :   Yes   Medication Response :   Symptoms improved   KENNEDY, RN, MANUVETTE E - 04/12/2016 20:50 EDT

## 2016-04-12 NOTE — Nursing Note (Signed)
Medication Administration Follow Up-Text       Medication Administration Follow Up Entered On:  04/12/2016 13:06 EDT    Performed On:  04/11/2016 20:48 EDT by Kyung RuddKENNEDY, RN, MANUVETTE E      Intervention Information:     tramadol  Performed by Mitchum, RN, Christina L on 04/11/2016 19:48:00 EDT       tramadol,25mg   Oral,mild pain (1-3) or temp > 100.5 F       Medication Effectiveness Evaluation   Medication Administration Reason :   Pain   Medication Effective :   Yes   Medication Response :   Symptoms improved   KENNEDY, RN, MANUVETTE E - 04/12/2016 13:05 EDT

## 2016-04-12 NOTE — Nursing Note (Signed)
Medication Administration Follow Up-Text       Medication Administration Follow Up Entered On:  04/12/2016 22:53 EDT    Performed On:  04/12/2016 21:45 EDT by Elmer Ramp, RN, MARIA NORLIS      Intervention Information:     lorazepam  Performed by Elmer Ramp, RN, MARIA NORLIS on 04/12/2016 20:45:00 EDT       lorazepam,1mg   Oral,insomnia       Medication Effectiveness Evaluation   Medication Administration Reason :   Anxiety   Medication Effective :   Yes   Medication Response :   Symptoms improved   SIOSON, RN, MARIA NORLIS - 04/12/2016 22:53 EDT

## 2016-04-13 NOTE — Nursing Note (Signed)
Medication Administration Follow Up-Text       Medication Administration Follow Up Entered On:  04/13/2016 22:24 EDT    Performed On:  04/13/2016 22:24 EDT by Elmer Ramp, RN, MARIA NORLIS      Intervention Information:     tramadol  Performed by SIOSON, RN, MARIA NORLIS on 04/13/2016 20:41:00 EDT       tramadol,25mg   Oral,mild pain (1-3) or temp > 100.5 F       Medication Effectiveness Evaluation   Medication Administration Reason :   Pain   Medication Effective :   Yes   Medication Response :   Symptoms improved   SIOSON, RN, MARIA NORLIS - 04/13/2016 22:24 EDT

## 2016-04-13 NOTE — Progress Notes (Signed)
Functional Indep Measure Scores - Text       Functional Independence Measure Scores Entered On:  04/13/2016 16:02 EDT    Performed On:  04/13/2016 16:01 EDT by Freddie Breecharlile, PT, Madison J               Walk/Wheelchair Score   Type of Wheelchair Goal :   Manual wheelchair   Mode of Locomotion on Discharge :   Wheelchair   Assessed Locomotion in a Wheelchair :   Yes   Distance Traveled in a Wheelchair :   150 ft   Amount of Assistance Necessary :   Standby supervision   Functional Independence Measure Wheelchair :   Supervision or setup   [place], [id number], Madison J - 04/13/2016 16:01 EDT   Comprehension Score   Mode of Comprehension :   Auditory   Comprehends Complex or Abstract Information Without Prompting or Cueing :   Yes   Understands Complex or Abstract Directions and Conversations :   At all times   Comprehension Indep Measure Interim :   Complete independence   Freddie BreechCarlile, PT, [place]Mad[place] J - 04/13/2016 16:01 EDT   Expression Score   Expression Mode :   Vocal   Expresses Complex or Abstract Information Without Prompting or Cueing :   Yes   Expresses Complex or Abstract Ideas :   Clearly and fluently at all times   Expression Indep Measure Interim :   Complete independence   Derrill CenterCarlile, PT, Madison J - 04/13/2016 16:01 EDT   Social Interaction Score   Interacts Appropriately Without Supervision :   Yes   Interacts Appropriately :   At all times   Social Interaction Indep Measure Interim :   Complete independence   Derrill CenterCarlile, PT, Madison J - 04/13/2016 16:01 EDT   Problem Solving Score   Solves Complex Problems :   Yes   Ability to Solve Complex Problems :   Consistently solves problems independently   Problem Solving Indep Measure Interim :   Complete independence   Derrill CenterCarlile, PT, Madison J - 04/13/2016 16:01 EDT   Memory Score   Recognizes, Remembers Routines, and Executes Requests Without Prompting :   Yes   Remembers and Executes Requests :   Consistently without need for repetition   Memory Indep Measure Interim :   Complete  independence   Derrill CenterCarlile, PT, Madison J - 04/13/2016 16:01 EDT

## 2016-04-13 NOTE — Progress Notes (Signed)
Functional Indep Measure Scores - Text       Functional Independence Measure Scores Entered On:  04/13/2016 22:57 EDT    Performed On:  04/13/2016 22:57 EDT by Elmer RampSIOSON, RN, MARIA NORLIS               Bladder Management Score   Patient's Independence Level with Bladder Management Tasks :   Assistance   Type of Assistance Necessary :   Helper changes linen or clothing/cleans up spill, Helper changes diaper or absorbent pad   Functional Independence Measure Bladder Management :   Total assistance   SIOSON, RN, MARIA NORLIS - 04/13/2016 22:57 EDT   Bowel Management Score   Patient's Independence Level with Bowel Management Tasks :   Assistance   Type of Assistance Necessary :   Helper changes linen or clothing/cleans up spill, Helper changes diaper   Functional Independence Measure Bowel Management :   Total assistance   SIOSON, RN, MARIA NORLIS - 04/13/2016 22:57 EDT   Transfer Bed/Chair/WC Score   Patient's independence Level with Bed, Chair, Wheelchair Tasks :   Assistance   Type of Assistance Necessary :   Two helpers needed   Bed, Chair, Wheelchair Transfer :   Total assistance   SIOSON, RN, MARIA NORLIS - 04/13/2016 22:57 EDT

## 2016-04-13 NOTE — Nursing Note (Signed)
Medication Administration Follow Up-Text       Medication Administration Follow Up Entered On:  04/13/2016 22:24 EDT    Performed On:  04/13/2016 22:24 EDT by Elmer RampSIOSON, RN, MARIA NORLIS      Intervention Information:     lorazepam  Performed by Elmer RampSIOSON, RN, MARIA NORLIS on 04/13/2016 20:26:00 EDT       lorazepam,1mg   Oral,insomnia       Medication Effectiveness Evaluation   Medication Administration Reason :   Anxiety   Medication Effective :   Yes   Medication Response :   Symptoms improved   SIOSON, RN, MARIA NORLIS - 04/13/2016 22:24 EDT

## 2016-04-13 NOTE — Progress Notes (Signed)
PT Inpatient Daily Documentation - Text       PT Inpatient Daily Documentation Entered On:  04/13/2016 16:08 EDT    Performed On:  04/13/2016 16:02 EDT by Freddie Breech, PT, [place] J               Reason for Treatment   Subjective Statement :   Pt found seated in WC in room, agreed to tx. Left seated in WC, seat belt in place able to propel power WC back to room without assist.      *Reason for Referral :   Progressive MS (dx 1989)  new weakness following UTI and C-diff    3 hrs/day     *Chief Complaint :   weakness bilat LE's, tightness R ankle/calf     [facility] - 04/13/2016 16:02 EDT   Review/Treatments Provided   PT Goals :   PT Short Term Goals    04/12/2016  Bed Mobility Goal #1: Roll to right and left; Mod A; Progressing, continue  Transfer Goal #1: Transfer board; Mod A; Progressing, continue  Wheelchair Management Goal #3: Power wheelchair mobility indoors; Setup, Distant S; Initial  Wheelchair Management Goal #4: Pressure relief tilt in space (power wheelchair); Mod I; Initial  Balance Goal #1: Unsupported short sit; Mod A; Static sitting; 2; Improve independence with activities of daily living; Progressing, continue     PT Plan :   Treatment Frequency:  Daily (modified)   Performed By: Eliseo Gum, PT, AMBER R  03/30/2016 09:30  Treatment Duration: 3 Performed By: Carney Living, AMBER R  03/30/2016 09:30  Planned Treatments: Balance training, Basic activities of daily living, Bed mobility training, Caregiver training, Electrical stimulation, Equipment training, Functional training, Manual therapy, Neuromuscular reeducation, Pain management, Posture/Body mechanics training,... Performed By: Carney Living, AMBER R  03/30/2016 09:30     Short Term Goals Reviewed :   Yes   Physical Therapy Orders :   Physical Therapy Inpatient Additional Treatment Rehab - 03/30/16 11:39:51 EDT, Balance training, Basic activities of daily living, Bed mobility training, Caregiver training, Electrical stimulation, Equipment training,  Functional training, Manual therapy, Neuromuscular reeducation, Pain management, Posture/...  PT [facility] - 03/29/16 19:38:00 EDT, Daily     Pain Present :   No actual or suspected pain   PT Therapeutic Activity,Mobility,Balance :   Yes   [facility] - 04/13/2016 16:02 EDT   Therapeutic Activities/Mobility/Balance   Functional Activity :   Therapeutic Activities  Activity 1:  Bed mobility; Max A; Pt required assist for BLE positioning and righting trunk. Pt talked through each step and problem solving instruction for assist w/max therapist cues.       Performed Date:  04/12/2016  Activity 2:  Transfer training; Max A; Pt completed R lateral scoot t/f w/transfer board and max A- problem solving instructions for instructing care w/max therapist cues.       Performed Date:  04/12/2016  Activity 3:  Balance; CGA; pt sitting up on EOB working on twisting at the waist looking from side to side, then shifting weight from side to side, and then raising B hands clasped and raising hands up/down 15 reps working on sitting balance, strength, and endurance.       Performed Date:  04/10/2016  Activity 4:  Transitional movement; lateral propped on L elbow to encourage appropriate trunk righting  and improve trunk ROM to L sidebending. Balance on L propped elbow with close SPV and VC for use of head/shoulders to control balance position.  Performed Date:  04/02/2016     [facility] - 04/13/2016 16:02 EDT   PT Therapeutic Activities Grid     Activity 1  Activity 2        Activity :    Dynamic sitting balance, Static sitting balance              Assist :    Minimal assistance, Moderate assistance, Maximal assistance              Comment :    Static/dyn balance seated in WC c varied level of assist (max-min) depending on fatigue. Able to perform forward weight shift<>upright in midline, side flexion<>midline, postural re-ed c scap retraction. Reinforced importance of momentum use, head control   to improve  ant/post position and compensatory techniques. Rest as needed. Noted improved overall upright posture, positioning and ability to self correct.              [facility] - 04/13/2016 16:02 EDT  Dennie Maizes, [place] J - 04/13/2016 16:02 EDT        Reassess Mobility :   Yes   Carlile, PT, [place] J - 04/13/2016 16:02 EDT   PT Mobility   Mobility Grid   Roll Left :   Rehab Maximal assistance   Roll Right :   Rehab Total assistance   Roll Supine :   Rehab Total assistance   Supine to Sit :   Rehab Maximal assistance   Sit to Supine :   Rehab Maximal assistance   Scooting :   Rehab Total assistance   Transfer Bed to and From Chair :   Rehab Maximal assistance   Transfer Toilet :   Total assistance   [facility] - 04/13/2016 16:02 EDT   Functional Mobility Details :   slide board with use of maxi slide   Carlile, PT, [place] J - 04/13/2016 16:02 EDT   Amb Ability Varied Surf/Distraction Grid   Level Surfaces :   Does not occur   Uneven Surfaces :   Does not occur   Distracting Environments :   Does not occur   Curbs :   Does not occur   Stairs :   Does not occur   Ramp :   Does not occur   [facility] - 04/13/2016 16:02 EDT   Transfer Type :   Transfer board   PT Mobility Reviewed :   Daleen Snook - 04/13/2016 16:02 EDT   Short Term Goals   Bed Mobility Goal Grid     Goal #1          Descriptors :    Roll to right and left              Level :    Moderate assistance              Status :    Progressing, continue                [facility] - 04/13/2016 16:02 EDT         Transfers Goal Grid     Goal #1          Descriptors :    Transfer board              Level :    Moderate assistance              Status :  Progressing, continue                [place], PT, [place] J - 04/13/2016 16:02 EDT         W/C Management Grid     Goal #1  Goal #2  Goal #3  Goal #4    Descriptors :    Power wheelchair mobility indoors   Pressure relief tilt in space (power wheelchair)   Power  wheelchair mobility indoors   Pressure relief tilt in space (power wheelchair)     Level :    Minimal assistance   Minimal assistance   Setup, Distant supervision   Modified independence     Status :    Goal met   Goal met   Initial   Initial     Date Met :    04/06/2016 EDT   04/06/2016 EDT             Freddie Breech, PT, [place] J - 04/13/2016 16:02 EDT  Freddie Breech PT, [place] J - 04/13/2016 16:02 EDT  Freddie Breech PT, [place] J - 04/13/2016 16:02 EDT  Freddie Breech, PT, [place] J - 04/13/2016 16:02 EDT      PT Balance Goal Grid     Goal #1          Descriptor :    Unsupported short sit              Assist Level :    Moderate assistance              Type :    Static sitting              Length of Time (minutes) :    2 minutes              Rationale :    Improve independence with activities of daily living              Status :    Progressing, continue                [facility] - 04/13/2016 16:02 EDT         PT ST Goals Reviewed :   Daleen Snook - 04/13/2016 16:02 EDT   Education   Home Caregiver Name/Relationship :   Aaminah Forrester  daughter- Lacretia Nicks, [place], Vella Raring - 04/13/2016 16:02 EDT   Physical Therapy Education Grid   Body Mechanics :   TEFL teacher understanding, Demonstrates, Needs further teaching, Needs practice/supervision   Physical Therapy Plan of Care :   Verbalizes understanding, Demonstrates, Needs further teaching, Needs practice/supervision   Carlile, PT, [place] J - 04/13/2016 16:02 EDT   Assessment   PT Impairments or Limitations :   Abnormal tone, Balance deficits, Bed mobility deficits, Coordination/Proprioception deficits, Endurance deficits, Equipment training, Impaired sensation, Range of motion deficits, Strength deficits, Transfer deficits, Transition deficits, Wheelchair mobility deficits   Barriers to Safe Discharge PT :   Medical diagnosis   Discharge Recommendations :   ELOS: 3 weeks; home with assist and PWC     PT Treatment Recommendations :   Pt tolerated session well  with ability to perform static and dynamic balance activity in sitting with min-max A as needed. Continues to be limited by dec endurance, dec strength, poor trunk control and impaired balance. Will benefit from further skilled PT to address impairments and ensure safety prior to DC.      [place], PT, [place] J - 04/13/2016  16:02 EDT   Time Spent With Patient   PT Time In :   14:30 EST   PT Time Out :   15:00 EST   PT NEUROMUSC RE-EDUC 15 MIN :   2 units   PT Neuromuscular Reeducation Time :   30 minutes   PT Total Individual Therapy Time :   30 minutes   PT Total Timed Code Treatment Units :   2 units   PT Total Timed Code Tx Minutes :   30 minutes   PT Total Treatment Time Rehab :   30 minutes   Dennie MaizesCarlile, PT, [place] J - 04/13/2016 16:02 EDT

## 2016-04-14 NOTE — Progress Notes (Signed)
 PT Inpatient Daily Documentation - Text       PT Inpatient Daily Documentation Entered On:  04/14/2016 12:06 EDT    Performed On:  04/14/2016 11:51 EDT by LEW, PT, KATHLEEN A               Reason for Treatment   Subjective Statement :   They say I'm still not cleaned out after going to the toilet many times yesterday  Pt supine in bed, left up in tilt-in-space power w/c w seatbelt secured (Mod I in power w/c).     *Reason for Referral :   Progressive MS (dx 1989)  new weakness following UTI and C-diff    3 hrs/day     *Chief Complaint :   weakness bilat LE's, tightness R ankle/calf     MCGREEVY, PT, KATHLEEN A - 04/14/2016 11:51 EDT   Review/Treatments Provided   PT Goals :   PT Short Term Goals    04/13/2016  Bed Mobility Goal #1: Roll to right and left; Mod A; Progressing, continue  Transfer Goal #1: Transfer board; Mod A; Progressing, continue  Wheelchair Management Goal #3: Power wheelchair mobility indoors; Setup, Distant S; Initial  Wheelchair Management Goal #4: Pressure relief tilt in space (power wheelchair); Mod I; Initial  Balance Goal #1: Unsupported short sit; Mod A; Static sitting; 2; Improve independence with activities of daily living; Progressing, continue     PT Plan :   Treatment Frequency:  Daily (modified)   Performed By: LORELLA, PT, AMBER R  03/30/2016 09:30  Treatment Duration: 3 Performed By: LORELLA KLEIN, AMBER R  03/30/2016 09:30  Planned Treatments: Balance training, Basic activities of daily living, Bed mobility training, Caregiver training, Electrical stimulation, Equipment training, Functional training, Manual therapy, Neuromuscular reeducation, Pain management, Posture/Body mechanics training,... Performed By: LORELLA KLEIN, AMBER R  03/30/2016 09:30     Short Term Goals Reviewed :   Yes   Physical Therapy Orders :   Physical Therapy Inpatient Additional Treatment Rehab - 03/30/16 11:39:51 EDT, Balance training, Basic activities of daily living, Bed mobility training, Caregiver  training, Electrical stimulation, Equipment training, Functional training, Manual therapy, Neuromuscular reeducation, Pain management, Posture/...  PT [facility] - 03/29/16 19:38:00 EDT, Daily     Pain Present :   No actual or suspected pain   PT Therapeutic Activity,Mobility,Balance :   Yes   PT Therapeutic Exercise :   Yes   MCGREEVY, PT, KATHLEEN A - 04/14/2016 11:51 EDT   Therapeutic Activities/Mobility/Balance   Functional Activity :   Therapeutic Activities  Activity 1:  Dynamic sitting balance, Static sitting balance; Minimal assistance, Mod A, Max A; Static/dyn balance seated in WC c varied level of assist (max-min) depending on fatigue. Able to perform forward weight shift<>upright in midline, side flexion<>midline, postural re-ed c scap retraction. Reinforced importance of momentum use, head control       Performed Date:  04/13/2016  Activity 2:  to improve ant/post position and compensatory techniques. Rest as needed. Noted improved overall upright posture, positioning and ability to self correct.       Performed Date:  04/13/2016  Activity 3:  Balance; CGA; pt sitting up on EOB working on twisting at the waist looking from side to side, then shifting weight from side to side, and then raising B hands clasped and raising hands up/down 15 reps working on sitting balance, strength, and endurance.       Performed Date:  04/10/2016  Activity 4:  Transitional movement; lateral propped on L  elbow to encourage appropriate trunk righting  and improve trunk ROM to L sidebending. Balance on L propped elbow with close SPV and VC for use of head/shoulders to control balance position.       Performed Date:  04/02/2016     LEW ALMETA SAUER A - 04/14/2016 11:51 EDT   PT Therapeutic Activities Grid     Activity 1  Activity 2        Activity :    Bed mobility   Transfer training           Assist :    Maximal assistance   Maximal assistance           Position :    Other: rolling, supine to sit EOB   Supported short sit            Equipment :       Sliding board           Response :    Demonstrated positive response to treatment   Demonstrated positive response to treatment           Comment :    Waffle mattress deflated for firmer support surface, still needing Max A w bed mobility.   bed>w/c w sliding board still requires Max A 2* decr balance, strength             MCGREEVY, PT, KATHLEEN A - 04/14/2016 11:51 EDT  MCGREEVY, PT, KATHLEEN A - 04/14/2016 11:51 EDT        Reassess Mobility :   Yes   MCGREEVY, PT, KATHLEEN A - 04/14/2016 11:51 EDT   PT Mobility   Mobility Grid   Roll Left :   Rehab Maximal assistance   Roll Right :   Rehab Maximal assistance   Roll Supine :   Rehab Maximal assistance   Supine to Sit :   Rehab Maximal assistance   Transfer Bed to and From Chair :   Rehab Maximal assistance   Transfer Toilet :   Total assistance   MCGREEVY, PT, KATHLEEN A - 04/14/2016 11:51 EDT   Functional Mobility Details :   slide board with use of maxi slide   MCGREEVY, PT, KATHLEEN A - 04/14/2016 11:51 EDT   Amb Ability Varied Surf/Distraction Grid   Level Surfaces :   Does not occur   Uneven Surfaces :   Does not occur   Distracting Environments :   Does not occur   Curbs :   Does not occur   Stairs :   Does not occur   Ramp :   Does not occur   MCGREEVY, PT, KATHLEEN A - 04/14/2016 11:51 EDT   Transfer Type :   Transfer board   PT Mobility Reviewed :   Yes   MCGREEVY, PT, KATHLEEN A - 04/14/2016 11:51 EDT   Therapeutic Exercise   Therapeutic Exercise RTF :   Therapeutic Exercise  Exercise 1:  Core strengthening; 2 x 15; While holding ball pt extended arms out in front and then laterally to B sides to increase core stabilty and balance.       Performed Date:  04/12/2016  Exercise 2:  shld retraction x 10.  APT strecth x 5 c 30 sec hold and lat trunk stretch to L x 5. pt performed all ex to assist c trunk and ue strength and rom to assist c cont progression and tol for fxn skilled act and to assist c progression of controled mobility  Performed Date:  04/11/2016  Exercise 3:  tilted back in power WC and reaching forward and grabing the therapists hand 2 sets of 5 reps c the R UE, 10 reps c the L UE active assisted; and then reaching c the L UE across the body 10 reps working on strengthening, coordination, and endurance.       Performed Date:  04/10/2016  Exercise 4:  sitting ankle pumps L LE for increasing ROM, strength, and endurance.       Performed Date:  04/10/2016     LEW ALMETA SAUER A - 04/14/2016 11:51 EDT   Therapeutic Exercise Grid     Exercise 1          Exercise :    Lower extremity strengthening              Position :    Supported sit              Equipment/Device :    Wheelchair              Repetition/Time :    10              Comment :    Pt was instructed in L ankle pumps sitting in w/c for thrombosis prophylaxis and incr strength (unable w R)                MCGREEVY, PT, KATHLEEN A - 04/14/2016 11:51 EDT         Short Term Goals   Bed Mobility Goal Grid     Goal #1          Descriptors :    Roll to right and left              Level :    Moderate assistance              Status :    Progressing, continue                MCGREEVY, PT, KATHLEEN A - 04/14/2016 11:51 EDT         Transfers Goal Grid     Goal #1          Descriptors :    Transfer board              Level :    Moderate assistance              Status :    Progressing, continue                MCGREEVY, PT, KATHLEEN A - 04/14/2016 11:51 EDT         W/C Management Grid     Goal #1  Goal #2  Goal #3  Goal #4    Descriptors :    Power wheelchair mobility indoors   Pressure relief tilt in space (power wheelchair)   Power wheelchair mobility indoors   Pressure relief tilt in space (power wheelchair)     Level :    Minimal assistance   Minimal assistance   Setup, Distant supervision   Modified independence     Status :    Goal met   Goal met   Goal met   Goal met     Date Met :    04/06/2016 EDT   04/06/2016 EDT   04/14/2016 EDT   04/14/2016 EDT       MCGREEVY, PT, KATHLEEN A -  04/14/2016 11:51 EDT  MCGREEVY,  PT, KATHLEEN A - 04/14/2016 11:51 EDT  MCGREEVY, PT, KATHLEEN A - 04/14/2016 11:51 EDT  MCGREEVY, PT, KATHLEEN A - 04/14/2016 11:51 EDT      PT Balance Goal Grid     Goal #1          Descriptor :    Unsupported short sit              Assist Level :    Moderate assistance              Type :    Static sitting              Length of Time (minutes) :    2 minutes              Rationale :    Improve independence with activities of daily living              Status :    Progressing, continue                MCGREEVY, PT, KATHLEEN A - 04/14/2016 11:51 EDT         PT ST Goals Reviewed :   Yes   MCGREEVY, PT, KATHLEEN A - 04/14/2016 11:51 EDT   Assessment   PT Impairments or Limitations :   Abnormal tone, Balance deficits, Bed mobility deficits, Coordination/Proprioception deficits, Endurance deficits, Equipment training, Impaired sensation, Range of motion deficits, Strength deficits, Transfer deficits, Transition deficits, Wheelchair mobility deficits   Barriers to Safe Discharge PT :   Medical diagnosis   Discharge Recommendations :   ELOS: 3 weeks; home with assist and PWC     PT Treatment Recommendations :   Pt has met STG of Mod I w power w/c mobility and pressure reliefs.  Con't to require Max A w bed mobility, sitting balance, and sliding board txf.     MCGREEVY, PT, KATHLEEN A - 04/14/2016 11:51 EDT   Time Spent With Patient   PT Time In :   10:30 EST   PT Time Out :   11:00 EST   PT ADL TRAINING 15 MN :   2 units   PT ADL Training Time :   30 minutes   PT Total Individual Therapy Time :   30 minutes   PT Total Timed Code Treatment Units :   2 units   PT Total Timed Code Tx Minutes :   30 minutes   PT Total Treatment Time Rehab :   30 minutes   MCGREEVY, PT, KATHLEEN A - 04/14/2016 11:51 EDT

## 2016-04-14 NOTE — Progress Notes (Signed)
Functional Indep Measure Scores - Text       Functional Independence Measure Scores Entered On:  04/14/2016 16:10 EDT    Performed On:  04/14/2016 16:10 EDT by Christena DeemHsia,  Erica               Comprehension Score   Mode of Comprehension :   Auditory   Comprehends Complex or Abstract Information Without Prompting or Cueing :   Yes   Understands Complex or Abstract Directions and Conversations :   At all times   Comprehension Indep Measure Interim :   Complete independence   Christena DeemHsia,  Erica - 04/14/2016 16:10 EDT   Expression Score   Expression Mode :   Vocal   Expresses Complex or Abstract Information Without Prompting or Cueing :   Yes   Expresses Complex or Abstract Ideas :   Clearly and fluently at all times   Expression Indep Measure Interim :   Complete independence   Christena DeemHsia,  Erica - 04/14/2016 16:10 EDT   Social Interaction Score   Interacts Appropriately Without Supervision :   Yes   Interacts Appropriately :   At all times   Social Interaction Indep Measure Interim :   Complete independence   Christena DeemHsia,  Erica - 04/14/2016 16:10 EDT   Problem Solving Score   Solves Complex Problems :   Yes   Ability to Solve Complex Problems :   Consistently solves problems independently   Problem Solving Indep Measure Interim :   Complete independence   Christena DeemHsia,  Erica - 04/14/2016 16:10 EDT   Memory Score   Recognizes, Remembers Routines, and Executes Requests Without Prompting :   Yes   Remembers and Executes Requests :   Consistently without need for repetition   Memory Indep Measure Interim :   Complete independence   Christena DeemHsia,  Erica - 04/14/2016 16:10 EDT

## 2016-04-14 NOTE — Progress Notes (Signed)
 Interdisciplinary Team Conference - Text       Interdisciplinary Team Conference PF Entered On:  04/14/2016 16:09 EDT    Performed On:  04/14/2016 16:09 EDT by Huey Frieze               Team Members   Nurse :   GEORGEANNA, RN, CATHERINE   Occupational Therapist :   LIDDIE GILLIE JOSETTE FORBES   Physical Therapist :   DIMITRI ALMETA EVA LELON   Rehab Physician :   MARZETTE LYNWOOD FORBES LIDDIE, OT, KRISTEN E - 04/15/2016 11:26 EDT   Care Manager :   GREG SYLIVA JAYSON GREG SYLIVA C - 04/15/2016 10:32 EDT   Primary Care Manager :   GREG SYLIVA JAYSON Huey Frieze - 04/14/2016 16:09 EDT   Primary Nurse :   Marijean, RN, Tawni LITTIE Marijean, RN, Tawni LITTIE - 04/15/2016 10:02 EDT     Primary OT :   DARRAL OT, MANDI JONELLE DARRAL, OT, MANDI R - 04/15/2016 9:27 EDT     Primary PT :   LORELLA, PT, AMBER R   Primary SLP :   RAYNALDO SLP, HERON LITTIE Huey Frieze - 04/14/2016 16:09 EDT   Team Conference Date :   04/15/2016 EDT   LIDDIE GILLIE JOSETTE E - 04/15/2016 11:26 EDT     Care Management Summary.   Type of Conference :   Interim   Discharge Plan :   home to childs home with assistance from spouse & [facility] services   Support Network :   family= children/spouse   GREG SYLIVA JAYSON - 04/15/2016 10:32 EDT   Nursing Summary.   Bowel Level of Assistance Interim :   Modified independence   Bowel Management :   Incontinent   Bowel Program :   Stool softener   Bowel Movement Last Date :   04/14/2016 EDT   Bladder Level of Assistance Interim :   Total assistance   Bladder Management :   Incontinent   Urinary Elimination Management :   Disposable pad   Tracheostomy Information :   No qualifying data available.       Progress Achieved This Week :   c/o of pain 5/10 in L shoulder. Lidocaine patches and tramadol effective. skin tear R elbow, mepilex applied. Skin tear L leg and knee, mepilex applied. Pt no longer wants anusol supp. hemorrhoids resolved. no other issues at this time.   Nursing Team Notes Current :   Yes   [place], RN,  Tawni LITTIE - 04/15/2016 10:02 EDT   OT Basic ADL   Basic ADL Grid   Eating :   Modified independence   Grooming :   Modified independence   Bathing :   Moderate assistance   UE Dressing :   Moderate assistance   LE Dressing :   Total assistance   Toileting :   Total assistance   Transfer Toilet :   Total assistance   Tub Transfer :   Does not occur   Shower Transfer :   Total assistance   Huey Frieze - 04/14/2016 16:09 EDT   ADL Comments :   ADL 10/6-Pt total A for all transfers. Pt completed bathing with mod A for bottom and B ft. Mod A for UB dressing for R UE and pulling down in back. Total A for LB dressing. Mod I for eating and grooming.  OT ADL Reviewed :   Yes   Huey Frieze - 04/14/2016 16:09 EDT   OT Short Term Goals.   Eating Goal Grid     Goal #1          Activity :    Manage food containers/packages              Assist :    Distant supervision              Status :    Progressing, continue                Huey Frieze - 04/14/2016 16:09 EDT         Grooming Goal Grid     Goal #1          Activity :    Grooming routine              Descriptors :    Supported short sit, Unsupported short sit, Wheelchair              Assist :    Contact guard assistance              Status :    Goal met              Date Met :    04/08/2016 EDT                Huey Frieze - 04/14/2016 16:09 EDT         Upper Body Dressing Short Term Goal Grid     Goal #1  Goal #2        Activity :    Upper extremity dressing   Upper extremity dressing           Assist :    Moderate assistance   Minimal assistance           Status :    Goal met   Initial             Huey Frieze - 04/14/2016 16:09 EDT  Huey Frieze - 04/14/2016 16:09 EDT        Lower Body Dressing Grid     Goal #1          Activity :    Lower extremity dressing              Lower Body Dressing Descriptors :    Supine, Supported short sit, Unsupported short sit              Assist :    Maximal assistance              Status :    Progressing, continue                Huey Frieze  - 04/14/2016 16:09 EDT         Bathing Goal Grid     Goal #2  Goal #1        Activity :    Bathe   Bathe           Descriptors :       Supported short sit, Unsupported short sit, Wheelchair           Assist :    Moderate assistance   Maximal assistance           Status :    Initial   Goal met  Date Met :       04/08/2016 EDT             WATERS, OT, MANDI R - 04/15/2016 9:27 EDT  Huey Frieze - 04/14/2016 16:09 EDT        Toileting and Transfers Goal Grid     Goal #1  Goal #2        Activity :    Toilet transfers   Toileting           Assist :    Maximal assistance   Maximal assistance           Equipment :    Bedside commode              Transfer Equipment :    Drop arm commode, Elevated toilet seat, Grab bars, Sliding board   Drop arm commode, Elevated toilet seat, Grab bars, Sliding board           Status :    Progressing, continue   Progressing, continue             Huey Frieze - 04/14/2016 16:09 EDT  Huey Frieze - 04/14/2016 16:09 EDT        OT Balance Goal Grid     Goal #1          Descriptors :    Unsupported short sit              Type :    Static sitting              Assist :    Contact guard assistance              Length of Time (minutes) :    5 minutes              Rationale :    Improve independence with activities of daily living              Status :    Initial                Huey Frieze - 04/14/2016 16:09 EDT         OT STG Reviewed :   Chaney Huey Frieze - 04/14/2016 16:09 EDT   OT Long Term Goals.   Patient/Caregiver Goals :   To get stronger, to be independent   Huey Frieze - 04/14/2016 16:09 EDT   OT IP Long Term Goals Grid     Long Term Goal 1  Long Term Goal 2        Goal :    Pt will perform simple meal prep activity with min assist to improve indep in ADLs   Pt will improve BUE strength to 3+/5 to improve indep in ADLS           Status :    Initial   Initial             Huey Frieze - 04/14/2016 16:09 EDT  Huey Frieze - 04/14/2016 16:09 EDT        Eating Goal   Eating Goal :   Complete  independence   Grooming Goal :   Modified independence   Bathing Goal :   Minimal contact assistance   Upper Extremity Dressing :   Minimal contact assistance   Lower Body Dressing Goal :   Minimal contact assistance   Toileting Goal :   Minimal contact assistance  Toilet Transfer Goal :   Minimal contact assistance   Huey Frieze - 04/14/2016 16:09 EDT   OT LTG Reconcilation :   Shower goal TBD. Goals may be upgraded pending progress in therapy.   OT Long Term Goals Reviewed :   Yes   Huey Frieze - 04/14/2016 16:09 EDT   Occupational Therapy Summary.   Barriers to Safe Discharge OT :   Complicated medical history, Decreased communication, Limited family support, Limited social support, Medical diagnosis, Progressive nature of disease, Safety awareness, Severity of deficits   Huey Frieze - 04/14/2016 16:09 EDT   Additional Comments DME OT :   Pt is making progress with UE strengthening but is still max-total A for all ADL's and transfers. Can she get off contact px?   [place]AT[place], OT, MANDI R - 04/15/2016 9:27 EDT     OT Team Notes Current :   Yes   Huey Frieze - 04/14/2016 16:09 EDT   PT Mobility   Mobility Grid   Roll Left :   Rehab Maximal assistance   Roll Right :   Rehab Maximal assistance   Roll Supine :   Rehab Maximal assistance   Supine to Sit :   Rehab Maximal assistance   Sit to Supine :   Rehab Maximal assistance   Scooting :   Rehab Total assistance   Transfer Bed to and From Chair :   Rehab Maximal assistance   Transfer Toilet :   Does not occur   [place], PT, AMBER R - 04/15/2016 10:49 EDT   Functional Mobility Details :   .   [place], PT, AMBER R - 04/15/2016 10:49 EDT   Amb Ability Varied Surf/Distraction Grid   Level Surfaces :   Does not occur   Uneven Surfaces :   Does not occur   Distracting Environments :   Does not occur   Curbs :   Does not occur   Stairs :   Does not occur   Ramp :   Does not occur   [place], PT, AMBER R - 04/15/2016 10:49 EDT   Transfer Type :   Transfer board   PT Mobility Reviewed :    Yes   [place], PT, AMBER R - 04/15/2016 10:49 EDT   PT WC Management   Type of Wheelchair :   Power wheelchair   Wheelchair Details :   .   LORELLA KLEIN, AMBER R - 04/15/2016 10:49 EDT   Wheelchair Mobility Grid   Level Surfaces :   Rehab Modified independence   [place], PT, AMBER R - 04/15/2016 10:49 EDT   Wheelchair Mobility Level Distance :   300 ft   [place]ENT[place], PT, AMBER R - 04/15/2016 10:49 EDT   Pressure Relief Grid     Wheelchair Pressure Relief Trial 1          Technique :    Tilt in space, power              Assist :    Setup              Duration (Seconds) :    120 seconds                [place], PT, AMBER R - 04/15/2016 10:49 EDT         Wheelchair Mobility Reviewed :   Chaney LORELLA, PT, AMBER R - 04/15/2016 10:49 EDT   PT Short Term Goals.   Bed Mobility  Goal Grid     Goal #1          Descriptors :    Roll to right and left              Level :    Moderate assistance              Status :    Progressing, continue                [place], PT, AMBER R - 04/15/2016 10:49 EDT         Transfers Goal Grid     Goal #1          Descriptors :    Transfer board              Level :    Moderate assistance              Status :    Progressing, continue                [place], PT, AMBER R - 04/15/2016 10:49 EDT         W/C Management Grid     Goal #1  Goal #2  Goal #3  Goal #4    Descriptors :    Power wheelchair mobility indoors   Pressure relief tilt in space (power wheelchair)   Power wheelchair mobility indoors   Pressure relief tilt in space (power wheelchair)     Level :    Minimal assistance   Minimal assistance   Setup, Distant supervision   Modified independence     Status :    Goal met   Goal met   Goal met   Goal met     Date Met :    04/06/2016 EDT   04/06/2016 EDT   04/14/2016 EDT   04/14/2016 EDT       [place], PT, AMBER R - 04/15/2016 10:49 EDT  BENT[place], PT, AMBER R - 04/15/2016 10:49 EDT  [place]ENT[place], PT, AMBER R - 04/15/2016 10:49 EDT  [place], PT, AMBER R - 04/15/2016 10:49 EDT      PT Balance Goal Grid     Goal #1           Descriptor :    Unsupported short sit              Assist Level :    Moderate assistance              Type :    Static sitting              Length of Time (minutes) :    2 minutes              Rationale :    Improve independence with activities of daily living              Status :    Progressing, continue                [place], PT, AMBER R - 04/15/2016 10:49 EDT         PT STG Reviewed :   Yes   [place], PT, AMBER R - 04/15/2016 10:49 EDT   PT Long Term Goals.   PT Patient,Caregiver Goal :   To get back to being independent   [place], PT, AMBER R - 04/15/2016 10:49 EDT   Outpatient PT Long Term Goals Rehab     Long Term Goal 1  Goal :    Pt will complete car transfer with mod A in 3 weeks              Status :    Progressing, continue                [place], PT, AMBER R - 04/15/2016 10:49 EDT         Mobility Goals Grid   Bed, Chair, Wheelchair Goal :   Minimal contact assistance   Wheelchair Mobility Level Surfaces Goal :   Modified independence   [place], PT, AMBER R - 04/15/2016 10:49 EDT   PT LTG Reconcilation :   LTG to be met in 3 weeks  LTG ongoing   Type of Wheelchair Goal :   Manual wheelchair   Mode of Locomotion Goal :   Wheelchair   [place], PT, AMBER R - 04/15/2016 10:49 EDT   Physical Therapy Summary.   Additional Comments DME PT :   Minimal functional mobility progress this week. Improved balance and upright WC tolerance. Will need assist at home--discussed hiring caregivers to provide assist needed. Any change on bowel/bladder issues? Needs to be getting to toilet instead of bedpan.   PT Team Notes Current :   Yes   [place], PT, AMBER R - 04/15/2016 10:49 EDT   Severity Level.   Comprehension Indep Measure Interim :   Complete independence   Comprehension Mode :   Auditory   Expression Indep Measure Interim :   Complete independence   Expression Mode :   Vocal   Problem Solving Indep Measure Interim :   Complete independence   Memory Indep Measure Interim :   Complete independence   Social  Interaction Indep Measure Interim :   Complete independence   Huey Frieze - 04/14/2016 16:09 EDT   SLP Short Term Goals.   Other SLP Short Term Goal Grid     Goal #1          Other :    Cognitive assessment completed.              Status :    Goal met              Date Met :    04/01/2016 EDT              Comment :    SLP signing off.                [place], OT, KRISTEN E - 04/15/2016 11:26 EDT         SLP STG Reviewed :   Yes   LIDDIE, OT, KRISTEN E - 04/15/2016 11:26 EDT   Speech Therapy Summary.   Barriers to Safe Discharge SLP :   Progressive nature of disease   SLP Progress Note Current :   N/A   LIDDIE, OT, KRISTEN E - 04/15/2016 11:26 EDT   Psychology Summary.   Psychology Progress Notes Current :   N/A   LIDDIE GILLIE JOSETTE FORBES - 04/15/2016 11:26 EDT   Interdisciplinary Discharge Planning.   Discharge Disposition Plan :   unsure. possibly home with spouse and dtr who lives locally or subacute.   [place], OT, JOSETTE FORBES - 04/15/2016 11:32 EDT       Rehab Anticipated Discharge Date :   04/24/2016 EDT     Next Level of Care :   Home Health, Outpatient   Follow-Up Plans :   OT, Physical Therapy: To Evaulate, Treat and Manage  Care of Related Diagnosis   LIDDIE GILLIE JOSETTE FORBES - 04/15/2016 11:26 EDT   Team Conference Discussion RTF :   JOSETTE LIDDIE, OTR/L, served as scribe for team conference 04/15/16.    Cannot get off precautions for 6 months 2* c-diff. PT requesting pt use toilet instead of bedpan. Needs to stay for urodynamics to help with continence, a major barrier. Family considering subacute for pt while spouse is in independent living portion.      [place]ANK[place], OT, KRISTEN E - 04/15/2016 11:32 EDT     Barriers to goals/Reasons for skilled intervention :   Bladder/bowel issues, Decreased endurance, Decreased sitting/standing balance, Decreased strength/ROM, Skin integrity issues   Medical/rehab reason for continued stay :   Anticoagulation, Bowel/bladder, Electrolyte imbalance, Home mobility issue, Infection, Medication  adjustments, Paresis, Pending labs/diagnostics, Skin integrity   HANKLA, OT, KRISTEN E - 04/15/2016 11:26 EDT   Anticipated Therapy Interventions.   PT Estimated Hours per Week :   7.5 hours per week   HANKLA, OT, KRISTEN E - 05/07/2016 15:01 EDT     Therapy Activity Tolerance :   3 hours over 5 days   HANKLA, OT, KRISTEN E - 04/15/2016 11:26 EDT   OT Estimated Hours Per Week :   7.5 hours per week       HANKLA, OT, KRISTEN E - 05/07/2016 15:01 EDT     Rehab Team Goals   Team Mobility Goals Grid     Goal #1          Team Mobility Goals :    Toilet transfers              Team Mobility Assist Level :    With max assist              Team Mobility Goal Status :    Ongoing                HANKLA, OT, KRISTEN E - 04/15/2016 11:26 EDT         Team Mobility Goals 2 Grid     Goal #1          Team Mobility Goals :    Bed and or wheelchair transfer              Team Mobility Assist Level :    With mod assist              Team Mobility Goal Status :    Ongoing                HANKLA, OT, KRISTEN E - 04/15/2016 11:26 EDT         Team Pain Management Goals Grid     Goal #1          Team Pain Management Goals :    Acute pain control              Team Pain Management Goals Status :    Initial                HANKLA, OT, KRISTEN E - 04/15/2016 11:26 EDT         Team Patient/Family Ed Goal Grid     Goal #1          Team Patient/Family Education Goals :    Demonstrates compliance with precautions, Demonstrates knowledge of diagnosis, Participate in family teaching/education              Team Patient/Family Edu Goal Status :  Ongoing                HANKLA, OT, KRISTEN E - 04/15/2016 11:26 EDT         Team Skin Surveillance/Wound Goals Grid     Goal #1          Team Skin Surveillance/Wound Care Goals :    Skin surveillance              Skin Surveillance Wound Care Goal Status :    Ongoing                HANKLA, OT, KRISTEN E - 04/15/2016 11:26 EDT         Team Goal Timed Voiding :   Q4 hours   HANKLA, OT, KRISTEN E - 04/15/2016 11:26 EDT

## 2016-04-14 NOTE — Progress Notes (Signed)
OT Inpatient Daily Documentation - Text       OT Inpatient Daily Documentation Entered On:  04/14/2016 16:14 EDT    Performed On:  04/14/2016 16:10 EDT by Christena DeemHsia,  Erica               Reason for Treatment   *Reason for Referral :   Admitted for MS exacerbation    82 yo female with past medical history of progressive MS diagnosed in 1989, CVA with right sided weakness, hypothyroidism and neurogenic bladder admitted [facility] in [place] on 03/06/16 with recurrent generalized weakness of the upper and lower extremities and poor posture. She has been wheelchair bound for the past few years due to right-sided weakness.    (per pt, she did not have a CVA)     *Chief Complaint :   weakness     Christena DeemHsia,  Erica - 04/14/2016 16:10 EDT   Review/Treatments Provided   OT Goals :   OT Short Term Goals    04/14/2016  Eating Goal #1: Manage food containers/packages; Distant S; Progressing, continue  Upper Body Dressing Goal #2: Upper extremity dressing; Minimal assistance; Initial  Lower Body Dressing Goal #1: Lower extremity dressing; Supine, Supported short sit, Unsupported short sit; Max A; Progressing, continue  Toileting and Transfers Goal #1: Toilet transfers; Max A; Bedside commode; Drop arm commode, Elevated toilet seat, Grab bars, Sliding board; Progressing, continue  Toileting and Transfers Goal #2: Toileting; Max A; Drop arm commode, Elevated toilet seat, Grab bars, Sliding board; Progressing, continue  Balance Goal #1: Unsupported short sit; Static sitting; CGA; 5; Improve independence with activities of daily living; Initial     OT Plan :   Treatment Frequency: Daily Performed By: Carilyn GoodpastureHOBBY, OT, LESLIE M   03/30/2016  Treatment Duration: 3 Performed By: Katherine RoanHOBBY, OT, LESLIE M   03/30/2016  Planned Treatments: Balance training, Basic Activities of Daily Living, Caregiver training, Coordination, Energy conservation training, Equipment training, Group therapy, Mobility training, Patient education, Safety education,  Therapeutic activities, Therapeutic exercises... Performed By: Katherine RoanHOBBY, OT, LESLIE M   03/30/2016     Occupational Therapy Orders :   Occupational Therapy Inpatient Additional Treatment Rehab - 03/30/16 16:40:41 EDT, Balance training, Basic Activities of Daily Living, Caregiver training, Coordination, Energy conservation training, Equipment training, Group therapy, Mobility training, Patient education, Safety education, Therapeutic activitie...  OT FIMS - 03/29/16 19:38:00 EDT, Daily     Pain Present :   No actual or suspected pain   OT Therapeutic Activity,Mobility,Balance :   Yes   Christena DeemHsia,  Erica - 04/14/2016 16:10 EDT   Therapeutic Activities   OT Therapeutic Activities RTF :   Therapeutic Activities  Activity 1:  Pt completed toilet transfer and toileting with total A,       Performed Date:  04/11/2016  Activity 2:  Reaching anteriorly, Other: core strengthening/dynamic sitting balance; Mod A; Sitting EOM, pt anteriorly reached to touch ball to ground using BUEs for 5 reps x 2 sets with Min A for control for trunk flexion and Mod A for trunk extension to come to upright sitting posture.  Task to incr core strength/dynamic sitting balance       Performed Date:  04/10/2016  Activity 3:  Transitional movement; Mod A; Sitting in wheelchair, Supported short sit; Improved stability in muscle group(s), Improved timing, control, and coordination, Increased activation of targeted muscle group(s), Integrated muscular activation into functional activity; pt performed lat scoot transfer with gait belt around hips and sliding board from w/c <>  EOM x2.  Pt's husband performed trasnfers 1x with CGA from OT after ed.       Performed Date:  04/09/2016  Activity 4:  Other: Dynamic sitting balance; Minimal assistance; Supported short sit, Unsupported short sit; Other: Ball/bean bags; Improved stability in muscle group(s), Improved timing, control, and coordination, Increased activation of targeted muscle group(s), Integrated muscular  activation into functional activity; Pt performed anterior reaching to place bean bags into container on table top with occassional assist to maintain balance  when reachign with RUE.  Task to increase dynamic sitting balance needed for greater I with transfers and ADL performance.       Performed Date:  04/09/2016  Activity 5:  Improved stability in muscle group(s), Improved timing, control, and coordination, Increased activation of targeted muscle group(s), Integrated muscular activation into functional activity       Performed Date:  04/09/2016     Christena Deem - 04/14/2016 16:10 EDT   OT Therapeutic Activities Grid     Activity 1          Activities :    Reaching activities              Comments :    Pt completed reaching activity with BUE from seated position in w/c with BUE supports removed. Pt demonstrates significantly better grip in L hand compared to R. Pt practiced reaching and grasping cones using wrist extension to facilitate grasp.                Christena Deem - 04/14/2016 16:10 EDT         Assessment   OT Impairments or Limitations :   Balance deficits, Basic activity of daily living deficits, Coordination deficits, Endurance deficits, Equipment training, IADL deficits, Mobility deficits, Proprioception deficits, Safety awareness deficits, Strength deficits   Barriers to Safe Discharge OT :   Complicated medical history, Decreased communication, Limited family support, Limited social support, Medical diagnosis, Progressive nature of disease, Safety awareness, Severity of deficits   OT Discharge Recommendations :   ELOS 3 weeks then home with spouse and power w/c     OT Treatment Recommendations :   Pt is progressing toward written goals. Pt would benefit from ongoing therapy to increase strength and endurance to maximize safety and independence with ADLs.     Christena Deem - 04/14/2016 16:10 EDT   Time Spent With Patient   OT Time In :   13:00 EST   OT Time Out :   13:30 EST   OT Functional Activities  Minutes :   30 minutes   OT FUNCTIONAL TRNG 15 MIN :   2    OT Total Individual Therapy Time :   30 minutes   OT Total Timed Code Treatment Units :   2 units   OT Total Timed Code Treatment Minutes :   30 minutes   OT Total Treatment Time Rehab :   30 minutes   Christena Deem - 04/14/2016 16:10 EDT

## 2016-04-15 NOTE — Nursing Note (Signed)
Medication Administration Follow Up-Text       Medication Administration Follow Up Entered On:  04/15/2016 21:39 EDT    Performed On:  04/15/2016 21:12 EDT by Alysia PennaErvin, RN, Baldemar LenisLaQuanda      Intervention Information:     lorazepam  Performed by Genoveva IllErvin, RN, LaQuanda on 04/15/2016 20:12:00 EDT       lorazepam,1mg   Oral,insomnia       Medication Effectiveness Evaluation   Medication Administration Reason :   Insomnia   Medication Effective :   Yes   Genoveva IllErvin, RN, LaQuanda - 04/15/2016 21:39 EDT

## 2016-04-15 NOTE — Progress Notes (Signed)
OT Inpatient Daily Documentation - Text       OT Inpatient Daily Documentation Entered On:  04/15/2016 15:04 EDT    Performed On:  04/15/2016 12:30 EDT by WATERS, OT, MANDI R               Reason for Treatment   *Reason for Referral :   Admitted for MS exacerbation    82 yo female with past medical history of progressive MS diagnosed in 1989, CVA with right sided weakness, hypothyroidism and neurogenic bladder admitted to [facility] [place] on 03/06/16 with recurrent generalized weakness of the upper and lower extremities and poor posture. She has been wheelchair bound for the past few years due to right-sided weakness.    (per pt, she did not have a CVA)     *Chief Complaint :   weakness     WATERS, OT, MANDI R - 04/15/2016 14:57 EDT   Review/Treatments Provided   OT Goals :   OT Short Term Goals    04/14/2016  Eating Goal #1: Manage food containers/packages; Distant S; Progressing, continue  Upper Body Dressing Goal #2: Upper extremity dressing; Minimal assistance; Initial  Lower Body Dressing Goal #1: Lower extremity dressing; Supine, Supported short sit, Unsupported short sit; Max A; Progressing, continue  Bathing Goal #2: Bathe; Mod A; Initial  Toileting and Transfers Goal #1: Toilet transfers; Max A; Bedside commode; Drop arm commode, Elevated toilet seat, Grab bars, Sliding board; Progressing, continue  Toileting and Transfers Goal #2: Toileting; Max A; Drop arm commode, Elevated toilet seat, Grab bars, Sliding board; Progressing, continue  Balance Goal #1: Unsupported short sit; Static sitting; CGA; 5; Improve independence with activities of daily living; Initial     OT Plan :   Treatment Frequency: Daily Performed By: Carilyn GoodpastureHOBBY, OT, LESLIE M   03/30/2016  Treatment Duration: 3 Performed By: Katherine RoanHOBBY, OT, LESLIE M   03/30/2016  Planned Treatments: Balance training, Basic Activities of Daily Living, Caregiver training, Coordination, Energy conservation training, Equipment training, Group therapy, Mobility  training, Patient education, Safety education, Therapeutic activities, Therapeutic exercises... Performed By: Katherine RoanHOBBY, OT, LESLIE M   03/30/2016     Occupational Therapy Orders :   Occupational Therapy Inpatient Additional Treatment Rehab - 03/30/16 16:40:41 EDT, Balance training, Basic Activities of Daily Living, Caregiver training, Coordination, Energy conservation training, Equipment training, Group therapy, Mobility training, Patient education, Safety education, Therapeutic activitie...  OT FIMS - 03/29/16 19:38:00 EDT, Daily     Pain Present :   Yes actual or suspected pain   OT Therapeutic Exercise :   Yes   OT Modalities :   Yes   WATERS, OT, MANDI R - 04/15/2016 14:57 EDT   Pain Assessment   Pain Location :   Shoulder   Laterality :   Left   Self Report Pain :   FACES pain scale   WATERS, OT, MANDI R - 04/15/2016 14:57 EDT   Therapeutic Exercise   Therapeutic Exercise RTF :   Therapeutic Exercise  Exercise 1:  Upper extremity strengthening; Unsupported ring sit; Theraband; 15x2; green; Pt performed scap retraction and external rotation seated EOM to increase posterior shoulder stability.       Performed Date:  04/10/2016  Exercise 2:  with L hand and increased time due to fatigue and decreased strength. Placed key shaped pegs in holes with increased difficulty and L hand.       Performed Date:  04/04/2016     [place] OT, [place] - 04/15/2016  14:57 EDT   OT Therapeutic Exercise Grid     Exercise 1          Comments :    Issued, educated, and demonstrated B UE therex with yellow theraband to increase all shld and rotator cuff muscles. Using gravity when unable to use theraband due to pain or decreased strength.                 WATERS, OT, MANDI R - 04/15/2016 14:57 EDT         Modalities   Modalities     Activity 1          Modality :    Electrical stimulation, attended              Body Region :    L shld sublux for pain, L and R trunk ext, R obliques, R wrist flex/ext              Settings :    set on FES               Minutes :    25 minutes              Response :    Positive response to treatment, Tolerated well              Comments  (Comment: Pt tolerated FES bike well with increased ability to keep head and trunk in midline. [WATERS, OT, MANDI R - 04/15/2016 14:57 EDT] )         WATERS, OT, MANDI R - 04/15/2016 14:57 EDT         Assessment   OT Impairments or Limitations :   Balance deficits, Basic activity of daily living deficits, Coordination deficits, Endurance deficits, Equipment training, IADL deficits, Mobility deficits, Proprioception deficits, Safety awareness deficits, Strength deficits   Barriers to Safe Discharge OT :   Complicated medical history, Decreased communication, Limited family support, Limited social support, Medical diagnosis, Progressive nature of disease, Safety awareness, Severity of deficits   OT Discharge Recommendations :   ELOS 3 weeks then home with spouse and power w/c     OT Treatment Recommendations :   Pt continues to req skilled OT services to increase strength, AROM, transfers, mobility, and ADL's.      WATERS, OT, MANDI R - 04/15/2016 14:57 EDT   Time Spent With Patient   OT Time In :   12:30 EST   OT Time Out :   14:00 EST   OT Attended E-Stim Time :   75 minutes   OT Attended E-Stim Units :   5 units   OT Therapeutic Exercise Units :   1 units   OT Therapeutic Exercise Time :   15 minutes   OT Total Individual Therapy Time :   90 minutes   OT Total Timed Code Treatment Units :   6 units   OT Total Timed Code Treatment Minutes :   90 minutes   OT Total Treatment Time Rehab :   90 minutes   WATERS, OT, MANDI R - 04/15/2016 14:57 EDT

## 2016-04-15 NOTE — Nursing Note (Signed)
Medication Administration Follow Up-Text       Medication Administration Follow Up Entered On:  04/15/2016 21:39 EDT    Performed On:  04/15/2016 21:12 EDT by Alysia Penna, RN, Baldemar Lenis      Intervention Information:     tramadol  Performed by Alysia Penna, RN, Baldemar Lenis on 04/15/2016 20:12:00 EDT       tramadol,25mg   Oral,mild pain (1-3) or temp > 100.5 F       Medication Effectiveness Evaluation   Medication Administration Reason :   Pain   Medication Effective :   Yes   Medication Response :   Symptoms improved   Genoveva Ill - 04/15/2016 21:39 EDT

## 2016-04-15 NOTE — Progress Notes (Signed)
Functional Indep Measure Scores - Text       Functional Independence Measure Scores Entered On:  04/15/2016 10:07 EDT    Performed On:  04/15/2016 10:06 EDT by Mitchum, RN, Christina L               Eating Score   Patient's Independence Level With Eating Tasks :   Independent/Modified independence   Independent or Modifications Needed :   Complete independence   Functional Independence Measure Eating :   Complete independence   Mitchum, RN, Christina L - 04/15/2016 10:06 EDT   Toileting Score   Patient's Independence Level with Toileting Tasks :   Does not occur   Toileting :   Does not occur   Mitchum, RN, Christina L - 04/15/2016 10:06 EDT   Bladder Management Score   Patient's Independence Level with Bladder Management Tasks :   Assistance   Type of Assistance Necessary :   Helper changes linen or clothing/cleans up spill, Helper changes diaper or absorbent pad   Functional Independence Measure Bladder Management :   Total assistance   Mitchum, RN, Christina L - 04/15/2016 10:06 EDT   Bowel Management Score   Patient's Independence Level with Bowel Management Tasks :   Assistance   Type of Assistance Necessary :   Helper changes linen or clothing/cleans up spill, Helper changes diaper   Functional Independence Measure Bowel Management :   Total assistance   Mitchum, RN, Elvin SoChristina L - 04/15/2016 10:06 EDT   Transfer Bed/Chair/WC Score   Patient's independence Level with Bed, Chair, Wheelchair Tasks :   Assistance   Type of Assistance Necessary :   Assistance for more than half (patient performs 25% - 49% of tasks), Two helpers needed   Bed, Chair, Wheelchair Transfer :   Total assistance   Mitchum, RN, Elvin SoChristina L - 04/15/2016 10:06 EDT   Transfer Toilet Score   Patient's Independence Level with Transfer Toilet Tasks :   Does not occur   Transfer Toilet :   Does not occur   Mitchum, RN, Christina L - 04/15/2016 10:06 EDT

## 2016-04-15 NOTE — Nursing Note (Signed)
Medication Administration Follow Up-Text       Medication Administration Follow Up Entered On:  04/15/2016 19:49 EDT    Performed On:  04/15/2016 19:48 EDT by Farrel DemarkMitchum, RN, Christina L      Intervention Information:     acetaminophen  Performed by Mitchum, RN, Christina L on 04/15/2016 16:21:00 EDT       acetaminophen,650mg   Oral,mild pain (1-3)       Medication Effectiveness Evaluation   Medication Administration Reason :   Other: headache   Medication Effective :   Yes   Medication Response :   Symptoms improved   Mitchum, RN, Christina L - 04/15/2016 19:48 EDT

## 2016-04-15 NOTE — Nursing Note (Signed)
Medication Administration Follow Up-Text       Medication Administration Follow Up Entered On:  04/15/2016 0:12 EDT    Performed On:  04/14/2016 21:40 EDT by Mayford Knife, RN, MICHELE      Intervention Information:     lorazepam  Performed by Mayford Knife RN, MICHELE on 04/14/2016 20:46:00 EDT       lorazepam,1mg   Oral,insomnia       Medication Effectiveness Evaluation   Medication Administration Reason :   Anxiety, Insomnia   Medication Effective :   Yes   Medication Response :   Symptoms improved   TURNER, RN, MICHELE - 04/15/2016 0:12 EDT

## 2016-04-15 NOTE — Progress Notes (Signed)
Functional Indep Measure Scores - Text       Functional Independence Measure Scores Entered On:  04/15/2016 4:03 EDT    Performed On:  04/15/2016 4:01 EDT by Mayford KnifeURNER, RN, MICHELE               Eating Score   Patient's Independence Level With Eating Tasks :   Does not occur   Functional Independence Measure Eating :   Does not occur   TURNER, RN, MICHELE - 04/15/2016 4:01 EDT   Bladder Management Score   Patient's Independence Level with Bladder Management Tasks :   Assistance   Type of Assistance Necessary :   Helper changes linen or clothing/cleans up spill, Helper changes diaper or absorbent pad, Helper initiates timed voiding schedule   Functional Independence Measure Bladder Management :   Total assistance   TURNER, RN, MICHELE - 04/15/2016 4:01 EDT   Bowel Management Score   Patient's Independence Level with Bowel Management Tasks :   Assistance   Type of Assistance Necessary :   Helper positions and holds bedpan, assists patient to roll on AND off the bedpan, Helper changes linen or clothing/cleans up spill   Functional Independence Measure Bowel Management :   Total assistance   TURNER, RN, MICHELE - 04/15/2016 4:01 EDT

## 2016-04-15 NOTE — Progress Notes (Signed)
Functional Indep Measure Scores - Text       Functional Independence Measure Scores Entered On:  04/15/2016 14:57 EDT    Performed On:  04/15/2016 12:30 EDT by WATERS, OT, MANDI R               Comprehension Score   Mode of Comprehension :   Auditory   Comprehends Complex or Abstract Information Without Prompting or Cueing :   Yes   Understands Complex or Abstract Directions and Conversations :   At all times   Comprehension Indep Measure Interim :   Complete independence   WATERS, OT, MANDI R - 04/15/2016 14:56 EDT   Expression Score   Expression Mode :   Vocal   Expresses Complex or Abstract Information Without Prompting or Cueing :   Yes   Expresses Complex or Abstract Ideas :   Clearly and fluently at all times   Expression Indep Measure Interim :   Complete independence   WATERS, OT, MANDI R - 04/15/2016 14:56 EDT   Social Interaction Score   Interacts Appropriately Without Supervision :   Yes   Interacts Appropriately :   At all times   Social Interaction Indep Measure Interim :   Complete independence   WATERS, OT, MANDI R - 04/15/2016 14:56 EDT   Problem Solving Score   Solves Complex Problems :   Yes   Ability to Solve Complex Problems :   Consistently solves problems independently   Problem Solving Indep Measure Interim :   Complete independence   WATERS, OT, MANDI R - 04/15/2016 14:56 EDT   Memory Score   Recognizes, Remembers Routines, and Executes Requests Without Prompting :   Yes   Remembers and Executes Requests :   Consistently without need for repetition   Memory Indep Measure Interim :   Complete independence   WATERS, OT, MANDI R - 04/15/2016 14:56 EDT

## 2016-04-15 NOTE — Case Communication (Signed)
 CM Discharge Planning Assessment - Text       CM Discharge Planning Ongoing Assessment Entered On:  04/15/2016 12:11 EDT    Performed On:  04/15/2016 12:07 EDT by GREG LLANOS C               Discharge Needs I   Previously Documented Discharge Needs :   DISCHARGE PLAN/NEEDS:  Anticipated Discharge Date: 04/17/2016 - GREG LLANOS BROCKS - 04/11/16 19:05:00  Discharge To, Anticipated: Home with family support, Home with home health - GREG LLANOS BROCKS - 04/11/16 19:05:00  EQUIPMENT/TREATMENT NEEDS:       Previously Documented Benefits Information :   Performed By: Karen Janann NOVAK  - 03/28/16 11:44:00       Anticipated Discharge Date :   04/17/2016 EDT   Anticipated Discharge Time Slot :   1000-1200   Discharge To :   Home with family support, Home with home health   Home Caregiver Name/Relationship :   Christine [name]  daughter- beth   CM Progress Note :   04/01/2016  SW went to speak with Pt several times this day, but she was always indisposed - SW able to meet 1:1 with spouse.  SW presented the [facility]- Summary.  Pt's spouse does not feel the anticipated D/C date will be met, as far as, Pt making proposed goals.  Spouse would like to see Pt make it to as close to [place] with transfers as possible.  Additionally, Spouse would like to hold off presenting D/C date, due to potential of upsetting Pt.  Pt seems to be struggling with loss of independence and ability.  Please note that Pt has been in/out of hospitals since June.  Next, she has not gotten therapy in the past 3-4 weeks.  Spouse concerned about endurance and muscle loss.  Spouse requested assistance with release of information from Dr. Arlyss in [facility]rolog[place] in [place]  --> send clinical(s) to Dr. Dewane with [facility] (faxed releases).  SW will continue to assist/prn.  vcd     04/08/2016  SW went to relay ITC-Summary to Pt. Pt requested that I return when she has her husbanc.  SW will follow up & continue to assist/prn.     04/09/2016  SW  went to meet with Pt & her spouse.  Family would like to explore sub-acute facilities that have a community with level of services & one that has a golf course close by for spouse.  SW will put together local regressive/progressive communities in the [place] area.  SW will continue to assist/prn.  vcd    04/11/2016  SW was informed by Dr. Stanly that Dr. Brendan will need to see Pt in his office.  SW called Dr. Brendan - Urology:  [facility] [phone]) and spoke with [place] who recieved info, but relayed that she would have [place], RN call SW back.  SW will continue to assist/prn. vcd    04/15/2016  @ 12:05 SW called Dr. Jennine' office this day to inquire about appointment.  Was informed that Pt has appt on 10/12 @ 1:45, then @ 2:15 for the Dynamic with the PA.  SW will relay info to MD & Pt/spouse & assist with transport.  vcd     [name],  [name] - 04/15/2016 12:07 EDT

## 2016-04-15 NOTE — Nursing Note (Signed)
Medication Administration Follow Up-Text       Medication Administration Follow Up Entered On:  04/15/2016 16:41 EDT    Performed On:  04/15/2016 16:41 EDT by Farrel DemarkMitchum, RN, Christina L      Intervention Information:     tramadol  Performed by Mitchum, RN, Christina L on 04/15/2016 14:08:00 EDT       tramadol,25mg   Oral,mild pain (1-3) or temp > 100.5 F       Medication Effectiveness Evaluation   Medication Administration Reason :   Pain   Medication Effective :   Yes   Medication Response :   Symptoms improved   Mitchum, RN, Christina L - 04/15/2016 16:41 EDT

## 2016-04-16 NOTE — Progress Notes (Signed)
Functional Indep Measure Scores - Text       Functional Independence Measure Scores Entered On:  04/16/2016 15:01 EDT    Performed On:  04/16/2016 15:00 EDT by Kevan NyGATES, PTA, Melynda RippleLEANNE M               Transfer Bed/Chair/WC Score   Patient's independence Level with Bed, Chair, Wheelchair Tasks :   Assistance   Type of Assistance Necessary :   Assistance for more than half (patient performs 25% - 49% of tasks)   Bed, Chair, Wheelchair Transfer :   Maximal assistance   Alger MemosGATES, PTA, LEANNE M - 04/16/2016 15:00 EDT   Comprehension Score   Mode of Comprehension :   Auditory   Comprehends Complex or Abstract Information Without Prompting or Cueing :   Yes   Understands Complex or Abstract Directions and Conversations :   At all times   Comprehension Indep Measure Interim :   Complete independence   GATES, PTA, Melynda RippleLEANNE M - 04/16/2016 15:00 EDT   Expression Score   Expression Mode :   Vocal   Expresses Complex or Abstract Information Without Prompting or Cueing :   Yes   Expresses Complex or Abstract Ideas :   Clearly and fluently at all times   Expression Indep Measure Interim :   Complete independence   Alger MemosGATES, PTA, LEANNE M - 04/16/2016 15:00 EDT   Social Interaction Score   Interacts Appropriately Without Supervision :   Yes   Interacts Appropriately :   At all times   Social Interaction Indep Measure Interim :   Complete independence   Alger MemosGATES, PTA, LEANNE M - 04/16/2016 15:00 EDT   Problem Solving Score   Solves Complex Problems :   Yes   Ability to Solve Complex Problems :   Consistently solves problems independently   Problem Solving Indep Measure Interim :   Complete independence   GATES, PTA, Melynda RippleLEANNE M - 04/16/2016 15:00 EDT   Memory Score   Recognizes, Remembers Routines, and Executes Requests Without Prompting :   Yes   Remembers and Executes Requests :   Consistently without need for repetition   Memory Indep Measure Interim :   Complete independence   Alger MemosGATES, PTA, LEANNE M - 04/16/2016 15:00 EDT

## 2016-04-16 NOTE — Progress Notes (Signed)
PT Inpatient Daily Documentation - Text       PT Inpatient Daily Documentation Entered On:  04/16/2016 15:50 EDT    Performed On:  04/15/2016 10:00 EDT by Eliseo Gum, PT, AMBER R               Reason for Treatment   *Reason for Referral :   Progressive MS (dx 1989)  new weakness following UTI and C-diff    3 hrs/day     *Chief Complaint :   weakness bilat LE's, tightness R ankle/calf     BENTON, PT, AMBER R - 04/16/2016 15:44 EDT   Review/Treatments Provided   PT Goals :   PT Short Term Goals    04/16/2016  Bed Mobility Goal #1: Roll to right and left; Mod A; Progressing, continue  Transfer Goal #1: Transfer board; Mod A; Progressing, continue  Balance Goal #1: Unsupported short sit; Mod A; Static sitting; 2; Improve independence with activities of daily living; Progressing, continue     PT Plan :   Treatment Frequency:  Daily (modified)   Performed By: Eliseo Gum, PT, AMBER R  03/30/2016 09:30  Treatment Duration: 3 Performed By: Carney Living, AMBER R  03/30/2016 09:30  Planned Treatments: Balance training, Basic activities of daily living, Bed mobility training, Caregiver training, Electrical stimulation, Equipment training, Functional training, Manual therapy, Neuromuscular reeducation, Pain management, Posture/Body mechanics training,... Performed By: Carney Living, AMBER R  03/30/2016 09:30     Short Term Goals Reviewed :   Yes   Physical Therapy Orders :   Physical Therapy Inpatient Additional Treatment Rehab - 03/30/16 11:39:51 EDT, Balance training, Basic activities of daily living, Bed mobility training, Caregiver training, Electrical stimulation, Equipment training, Functional training, Manual therapy, Neuromuscular reeducation, Pain management, Posture/...  PT FIMS - 03/29/16 19:38:00 EDT, Daily     Pain Present :   No actual or suspected pain   PT Therapeutic Activity,Mobility,Balance :   Yes   BENTON, PT, AMBER R - 04/16/2016 15:44 EDT   Therapeutic Activities/Mobility/Balance   Functional Activity :   Therapeutic  Activities  Activity 1:  Bed mobility; pt supione , pt on rolling tech, pt on need to lift head and reach. pt working on rolling L. pt perfomred x 5 bout c Rle felx and assist by PTA a hips. pt performing to work  on progression of bed mob skills. pt max assist c sup <>sit tx due to weekness.       Performed Date:  04/16/2016  Activity 2:  Transfer training; Max A; Increased activation of targeted muscle group(s); pt c tx wc <>mat towradrs her L c use of slide board. pt c ed on board placment however placed c mod assist. pt performed wc<>mat tx x 2 reps c maxassist. pt c ed for head hip, progression of movment and assist at trunk due to weekness       Performed Date:  04/16/2016  Activity 3:  pt and husband participating in tx ed c roper staff. staff member able to assit in tx and tx set up c slide board. pt ed on assist c self directing care during tx ed on assisting roper staff memper c tx. both husband and wife ed on need to self direct tx       Performed Date:  04/16/2016  Activity 4:  and act. however at end of session was infomred by [facility] OT that roper staff member would need to look in to use of gurney for medical porgedure vs a  tx to chair.       Performed Date:  04/16/2016     Carney Living, AMBER R - 04/16/2016 15:44 EDT   PT Therapeutic Activities Grid     Activity 1  Activity 2        Activity :    Dynamic sitting balance              Assist :    Setup              Response :    Demonstrated positive response to treatment, Improved stability in muscle group(s), Improved timing, control, and coordination, Increased activation of targeted muscle group(s)              Comment :    ant/post WS without UE support with close SPV for safety. Progressed to throwing/catching ball with B UE to challenge dynamic sitting balance. Ant propped in short sitting with tenodesis preserved WB through B UE with increased ant WS, progressed (cont)   (cont) to full trunk flexion and return to full upright sitting with use of  inhalation to facilitate trunk ext. Rest breaks throughout tx 2* decr endurance. Lateral transfer in full foward trunk flexion position with maxislide to decr friction with mod A.             BENTON, PT, AMBER R - 04/16/2016 15:44 EDT  BENTON, PT, AMBER R - 04/16/2016 15:44 EDT        Reassess Mobility :   Yes   BENTON, PT, AMBER R - 04/16/2016 15:44 EDT   PT Mobility   Mobility Grid   Roll Left :   Rehab Maximal assistance   Roll Right :   Rehab Maximal assistance   Roll Supine :   Rehab Maximal assistance   Supine to Sit :   Rehab Maximal assistance   Sit to Supine :   Rehab Maximal assistance   Scooting :   Rehab Total assistance   Transfer Bed to and From Chair :   Rehab Maximal assistance   Transfer Toilet :   Total assistance   BENTON, PT, AMBER R - 04/16/2016 15:44 EDT   Functional Mobility Details :   .   BENTON, PT, AMBER R - 04/16/2016 15:44 EDT   Amb Ability Varied Surf/Distraction Grid   Level Surfaces :   Does not occur   Uneven Surfaces :   Does not occur   Distracting Environments :   Does not occur   Curbs :   Does not occur   Stairs :   Does not occur   Ramp :   Does not occur   BENTON, PT, AMBER R - 04/16/2016 15:44 EDT   Transfer Type :   Transfer board   PT Mobility Reviewed :   Yes   BENTON, PT, AMBER R - 04/16/2016 15:44 EDT   Short Term Goals   Bed Mobility Goal Grid     Goal #1          Descriptors :    Roll to right and left              Level :    Moderate assistance              Status :    Progressing, continue                BENTON, PT, AMBER R - 04/16/2016 15:44 EDT         Transfers Goal  Grid     Goal #1          Descriptors :    Fish farm manager              Level :    Moderate assistance              Status :    Progressing, continue                BENTON, PT, AMBER R - 04/16/2016 15:44 EDT         W/C Management Grid     Goal #1  Goal #2  Goal #3  Goal #4    Descriptors :    Power wheelchair mobility indoors   Pressure relief tilt in space (power wheelchair)   Power wheelchair  mobility indoors   Pressure relief tilt in space (power wheelchair)     Level :    Minimal assistance   Minimal assistance   Setup, Distant supervision   Modified independence     Status :    Goal met   Goal met   Goal met   Goal met     Date Met :    04/06/2016 EDT   04/06/2016 EDT   04/14/2016 EDT   04/14/2016 EDT       Eliseo Gum, PT, AMBER R - 04/16/2016 15:44 EDT  BENTON, PT, AMBER R - 04/16/2016 15:44 EDT  BENTON, PT, AMBER R - 04/16/2016 15:44 EDT  BENTON, PT, AMBER R - 04/16/2016 15:44 EDT      PT Balance Goal Grid     Goal #1          Descriptor :    Unsupported short sit              Assist Level :    Moderate assistance              Type :    Static sitting              Length of Time (minutes) :    2 minutes              Rationale :    Improve independence with activities of daily living              Status :    Progressing, continue                BENTON, PT, AMBER R - 04/16/2016 15:44 EDT         PT ST Goals Reviewed :   Yes   BENTON, PT, AMBER R - 04/16/2016 15:44 EDT   Assessment   PT Impairments or Limitations :   Abnormal tone, Balance deficits, Bed mobility deficits, Coordination/Proprioception deficits, Endurance deficits, Equipment training, Impaired sensation, Pain limiting function, Range of motion deficits, Strength deficits, Transfer deficits, Transition deficits, Wheelchair mobility deficits   Barriers to Safe Discharge PT :   Medical diagnosis   Discharge Recommendations :   ELOS: 3 weeks; home with assist and PWC     PT Treatment Recommendations :   Pt is progressing with functional mobility and balance, however continues to demonstrate tetraparesis and decr endurance the prevents indep with functional transfers. She will cont to benefit from skilled PT to address above impairments and progress indep with functional mobility and tranfers prior to DC     Ehrhardt, PT, AMBER R - 04/16/2016 15:44 EDT   Time Spent With Patient   PT Time In :  10:00 EST   PT Time Out :   11:30 EST   BENTON, PT, AMBER R  - 04/16/2016 15:44 EDT   PT ADL TRAINING 15 MN :   0 units     PT ADL Training Time :   0 minutes     PT NEUROMUSC RE-EDUC 15 MIN :   0 units     PT Neuromuscular Reeducation Time :   0 minutes     PT Total Individual Therapy Time :   0 minutes     PT Total Timed Code Treatment Units :   0 units     PT Total Timed Code Tx Minutes :   0 minutes     PT Total Treatment Time Rehab :   0 minutes   BENTON, PT, AMBER R - 04/16/2016 15:57 EDT

## 2016-04-16 NOTE — Case Communication (Signed)
 CM Discharge Planning Assessment - Text       CM Discharge Planning Ongoing Assessment Entered On:  04/16/2016 9:53 EDT    Performed On:  04/16/2016 9:46 EDT by GREG LLANOS C               Discharge Needs I   Previously Documented Discharge Needs :   DISCHARGE PLAN/NEEDS:  Anticipated Discharge Date: 04/17/2016 - GREG LLANOS BROCKS - 04/15/16 12:07:00  Discharge To, Anticipated: Home with family support, Home with home health - GREG LLANOS BROCKS - 04/15/16 12:07:00  EQUIPMENT/TREATMENT NEEDS:       Previously Documented Benefits Information :   Performed By: Karen Janann NOVAK  - 03/28/16 11:44:00       Anticipated Discharge Date :   04/17/2016 EDT   Anticipated Discharge Time Slot :   1000-1200   Discharge To :   Home with family support   Home Caregiver Name/Relationship :   Alfred-spouse   CM Progress Note :   04/01/2016  SW went to speak with Pt several times this day, but she was always indisposed - SW able to meet 1:1 with spouse.  SW presented the [facility]- Summary.  Pt's spouse does not feel the anticipated D/C date will be met, as far as, Pt making proposed goals.  Spouse would like to see Pt make it to as close to [place] with transfers as possible.  Additionally, Spouse would like to hold off presenting D/C date, due to potential of upsetting Pt.  Pt seems to be struggling with loss of independence and ability.  Please note that Pt has been in/out of hospitals since June.  Next, she has not gotten therapy in the past 3-4 weeks.  Spouse concerned about endurance and muscle loss.  Spouse requested assistance with release of information from Dr. Arlyss in Urologist in [place]  --> send clinical(s) to Dr. Dewane with [facility] (faxed releases).  SW will continue to assist/prn.  vcd     04/08/2016  SW went to relay ITC-Summary to Pt. Pt requested that I return when she has her husbanc.  SW will follow up & continue to assist/prn.     04/09/2016  SW went to meet with Pt & her spouse.  Family would  like to explore sub-acute facilities that have a community with level of services & one that has a golf course close by for spouse.  SW will put together local regressive/progressive communities in the [place] area.  SW will continue to assist/prn.  vcd    04/11/2016  SW was informed by Dr. Stanly that Dr. Brendan will need to see Pt in his office.  SW called Dr. Brendan - Urology:  [facility] [phone]) and spoke with Dorthea who recieved info, but relayed that she would have [place], RN call SW back.  SW will continue to assist/prn. vcd    04/15/2016  @ 12:05 SW called Dr. Jennine' office this day to inquire about appointment.  Was informed that Pt has appt on 10/12 @ 1:45, then @ 2:15 for the Dynamic with the PA.  SW will relay info to MD & Pt/spouse & assist with transport.  vcd    04/15/2016  SW met with Pt & spouse to relay ITC-Summary and time/date for urology appt.  SW will need to set up transport with PWC, otherwise, she will not be able to move through the office.  D/C date as changed to 10/18.  Pt's spouse has  been visiting facilities on the list provided, but still has not made a decision.  Pt stated that she would cross that bridge later. Pt is wanting to concentrate on going to Dr. Lawanda office to get a plan for her bowel/bladder function(s).  Next TC will be held on 10/16.  SW will continue to assist.      04/16/2016 @9 :30a SW called this morning to [facility] to inquire about transport via PWC to MD's office.  Jason with [facility] relayed that there is only one tranport co. in the area that would assist with PWC's.  SW faxed info this day as to ensure transport for 04/17/2016  SW will follow up & continue to assist.  vcd              DEANTONIO,  VELIA C - 04/16/2016 9:46 EDT

## 2016-04-16 NOTE — Progress Notes (Signed)
Functional Indep Measure Scores - Text       Functional Independence Measure Scores Entered On:  04/16/2016 10:02 EDT    Performed On:  04/16/2016 7:30 EDT by WATERS, OT, MANDI R               Eating Score   Patient's Independence Level With Eating Tasks :   Independent/Modified independence   Independent or Modifications Needed :   Complete independence   Functional Independence Measure Eating :   Complete independence   WATERS, OT, MANDI R - 04/16/2016 10:00 EDT   Grooming Score   Patient's Independence Level with Grooming Tasks :   Independent/Modified independence   InM Grooming Indep or Modifications :   Complete independence   Grooming :   Complete independence   WATERS, OT, MANDI R - 04/16/2016 10:00 EDT   Bathing Score   Patient's Independence Level with Bathing Tasks :   Assistance   Type of Assistance Necessary :   Assistance with bathing body parts   Body Parts Assessed :   All body surfaces   Tasks Requiring Assistance :   Buttocks   Functional Independence Measure Bathing :   Minimal contact assistance   WATERS, OT, MANDI R - 04/16/2016 10:00 EDT   Upper Body Dressing Score   Patient's Independence Level with Upper Body Dressing Tasks :   Assistance   Type of Assistance Necessary :   Assistance with dressing tasks   Tasks Assessed :   Thread/Unthread right sleeve, Thread/Unthread left sleeve, Pull/Remove head through neckline, Pull/Remove over trunk   Tasks Requiring Assistance :   Pull/Remove over trunk   UE Dressing :   Minimal contact assistance   WATERS, OT, MANDI R - 04/16/2016 10:00 EDT   Lower Body Dressing Score   Patient's independence Level with Lower Body Dressing Tasks :   Assistance   Type of Assistance Necessary :   Assistance with dressing tasks   Tasks Assessed :   All dressing tasks   Tasks Requiring Assistance :   All dressing tasks   LE Dressing :   Total assistance   WATERS, OT, MANDI R - 04/16/2016 10:00 EDT   Toileting Score   Patient's Independence Level with Toileting Tasks :    Assistance   Type of Assistance Necessary :   Assistance with toileting tasks   Amount of Assistance Needed :   Adjusting clothing before toileting, Adjusting clothing after toileting, Cleansing perineal area   Toileting :   Total assistance   WATERS, OT, MANDI R - 04/16/2016 10:00 EDT   Transfer Bed/Chair/WC Score   Patient's independence Level with Bed, Chair, Wheelchair Tasks :   Assistance   Type of Assistance Necessary :   Total assist (patient performs less than 25%)   Bed, Chair, Wheelchair Transfer :   Total assistance   WATERS, OT, MANDI R - 04/16/2016 10:00 EDT   Transfer Toilet Score   Patient's Independence Level with Transfer Toilet Tasks :   Assistance   Amount of Assistance Necessary :   Total assist (patient performs less than 25%)   Transfer Toilet :   Total assistance   WATERS, OT, MANDI R - 04/16/2016 10:00 EDT   Transfer Shower Score   Patient's independence Level with Transfer Shower Tasks :   Assistance   Type of Assistance Necessary :   Total assist (patient performs less than 25%)   Shower Transfer :   Total assistance   WATERS, OT, MANDI R -  04/16/2016 10:00 EDT   Comprehension Score   Mode of Comprehension :   Auditory   Comprehends Complex or Abstract Information Without Prompting or Cueing :   Yes   Understands Complex or Abstract Directions and Conversations :   At all times   Comprehension Indep Measure Interim :   Complete independence   WATERS, OT, MANDI R - 04/16/2016 10:00 EDT   Expression Score   Expression Mode :   Vocal   Expresses Complex or Abstract Information Without Prompting or Cueing :   Yes   Expresses Complex or Abstract Ideas :   Clearly and fluently at all times   Expression Indep Measure Interim :   Complete independence   WATERS, OT, MANDI R - 04/16/2016 10:00 EDT   Social Interaction Score   Interacts Appropriately Without Supervision :   Yes   Interacts Appropriately :   At all times   Social Interaction Indep Measure Interim :   Complete independence   WATERS, OT,  MANDI R - 04/16/2016 10:00 EDT   Problem Solving Score   Solves Complex Problems :   Yes   Ability to Solve Complex Problems :   Consistently solves problems independently   Problem Solving Indep Measure Interim :   Complete independence   WATERS, OT, MANDI R - 04/16/2016 10:00 EDT   Memory Score   Recognizes, Remembers Routines, and Executes Requests Without Prompting :   Yes   Remembers and Executes Requests :   Consistently without need for repetition   Memory Indep Measure Interim :   Complete independence   WATERS, OT, MANDI R - 04/16/2016 10:00 EDT

## 2016-04-16 NOTE — Nursing Note (Signed)
Medication Administration Follow Up-Text       Medication Administration Follow Up Entered On:  04/16/2016 23:06 EDT    Performed On:  04/16/2016 23:06 EDT by Clide CliffBeddoe, RN, Cyndia Diverachel K      Intervention Information:     tramadol  Performed by Clide CliffBeddoe, RN, Cyndia Diverachel K on 04/16/2016 20:52:00 EDT       tramadol,25mg   Oral,mild pain (1-3) or temp > 100.5 F       Medication Effectiveness Evaluation   Medication Administration Reason :   Pain   Medication Effective :   Yes   Medication Response :   Symptoms improved   Clide CliffBeddoe, RCyndia Diver, Rachel K - 04/16/2016 23:06 EDT

## 2016-04-16 NOTE — Nursing Note (Signed)
Medication Administration Follow Up-Text       Medication Administration Follow Up Entered On:  04/16/2016 23:06 EDT    Performed On:  04/16/2016 23:06 EDT by Clide CliffBeddoe, RN, Cyndia Diverachel K      Intervention Information:     lorazepam  Performed by Clide CliffBeddoe, RN, Cyndia Diverachel K on 04/16/2016 20:52:00 EDT       lorazepam,1mg   Oral,insomnia       Medication Effectiveness Evaluation   Medication Administration Reason :   Anxiety   Medication Effective :   Yes   Medication Response :   Symptoms improved   Clide CliffBeddoe, RCyndia Diver, Rachel K - 04/16/2016 23:06 EDT

## 2016-04-16 NOTE — Progress Notes (Signed)
OT Inpatient Daily Documentation - Text       OT Inpatient Daily Documentation Entered On:  04/16/2016 10:10 EDT    Performed On:  04/16/2016 7:30 EDT by WATERS, OT, MANDI R               Reason for Treatment   *Reason for Referral :   Admitted for MS exacerbation    82 yo female with past medical history of progressive MS diagnosed in 1989, CVA with right sided weakness, hypothyroidism and neurogenic bladder admitted to [facility] in [place], [place] on 03/06/16 with recurrent generalized weakness of the upper and lower extremities and poor posture. She has been wheelchair bound for the past few years due to right-sided weakness.    (per pt, she did not have a CVA)     *Chief Complaint :   weakness     WATERS, OT, MANDI R - 04/16/2016 10:02 EDT   Review/Treatments Provided   OT Goals :   OT Short Term Goals    04/14/2016  Eating Goal #1: Manage food containers/packages; Distant S; Progressing, continue  Upper Body Dressing Goal #2: Upper extremity dressing; Minimal assistance; Initial  Lower Body Dressing Goal #1: Lower extremity dressing; Supine, Supported short sit, Unsupported short sit; Max A; Progressing, continue  Bathing Goal #2: Bathe; Mod A; Initial  Toileting and Transfers Goal #1: Toilet transfers; Max A; Bedside commode; Drop arm commode, Elevated toilet seat, Grab bars, Sliding board; Progressing, continue  Toileting and Transfers Goal #2: Toileting; Max A; Drop arm commode, Elevated toilet seat, Grab bars, Sliding board; Progressing, continue  Balance Goal #1: Unsupported short sit; Static sitting; CGA; 5; Improve independence with activities of daily living; Initial     OT Plan :   Treatment Frequency: Daily Performed By: Carilyn Goodpasture M   03/30/2016  Treatment Duration: 3 Performed By: Katherine Roan   03/30/2016  Planned Treatments: Balance training, Basic Activities of Daily Living, Caregiver training, Coordination, Energy conservation training, Equipment training, Group therapy, Mobility  training, Patient education, Safety education, Therapeutic activities, Therapeutic exercises... Performed By: Katherine Roan   03/30/2016     Occupational Therapy Orders :   Occupational Therapy Inpatient Additional Treatment Rehab - 03/30/16 16:40:41 EDT, Balance training, Basic Activities of Daily Living, Caregiver training, Coordination, Energy conservation training, Equipment training, Group therapy, Mobility training, Patient education, Safety education, Therapeutic activitie...  OT FIMS - 03/29/16 19:38:00 EDT, Daily     Pain Present :   No actual or suspected pain   OT Therapeutic Activity,Mobility,Balance :   Yes   Taping/Bandaging/  Strapping :   Yes   WATERS, OT, MANDI R - 04/16/2016 10:02 EDT   Therapeutic Activities   OT Therapeutic Activities RTF :   Therapeutic Activities  Activity 1:  Reaching activities; Pt completed reaching activity with BUE from seated position in w/c with BUE supports removed. Pt demonstrates significantly better grip in L hand compared to R. Pt practiced reaching and grasping cones using wrist extension to facilitate grasp.       Performed Date:  04/14/2016  Activity 2:  Reaching anteriorly, Other: core strengthening/dynamic sitting balance; Mod A; Sitting EOM, pt anteriorly reached to touch ball to ground using BUEs for 5 reps x 2 sets with Min A for control for trunk flexion and Mod A for trunk extension to come to upright sitting posture.  Task to incr core strength/dynamic sitting balance       Performed Date:  04/10/2016  Activity 3:  Transitional movement; Mod A; Sitting in wheelchair, Supported short sit; Improved stability in muscle group(s), Improved timing, control, and coordination, Increased activation of targeted muscle group(s), Integrated muscular activation into functional activity; pt performed lat scoot transfer with gait belt around hips and sliding board from w/c <> EOM x2.  Pt's husband performed trasnfers 1x with CGA from OT after ed.       Performed  Date:  04/09/2016  Activity 4:  Other: Dynamic sitting balance; Minimal assistance; Supported short sit, Unsupported short sit; Other: Ball/bean bags; Improved stability in muscle group(s), Improved timing, control, and coordination, Increased activation of targeted muscle group(s), Integrated muscular activation into functional activity; Pt performed anterior reaching to place bean bags into container on table top with occassional assist to maintain balance  when reachign with RUE.  Task to increase dynamic sitting balance needed for greater I with transfers and ADL performance.       Performed Date:  04/09/2016  Activity 5:  Improved stability in muscle group(s), Improved timing, control, and coordination, Increased activation of targeted muscle group(s), Integrated muscular activation into functional activity       Performed Date:  04/09/2016     Reassess Activities of Daily Living :   Yes   WATERS, OT, MANDI R - 04/16/2016 10:02 EDT   OT Basic ADL   Basic ADL Grid   Eating :   Modified independence   Grooming :   Modified independence   Bathing :   Minimal contact assistance   UE Dressing :   Minimal contact assistance   LE Dressing :   Total assistance   Toileting :   Total assistance   Transfer Toilet :   Total assistance   Tub Transfer :   Does not occur   Shower Transfer :   Total assistance   WATERS, OT, MANDI R - 04/16/2016 10:02 EDT   ADL Comments :   ADL 10/10-Pt total A for all transfers and bed mobility. Pt completed bathing with min A for bottom used LHS for B ft. Min A for UB dressing for pulling down in back. Pt educated on using sink counter to prop B elbows on in order to pull shirt over her head. Total A for LB dressing and toileting. Pt educated on using reacher and was able to thread toes in R ft 1st but req total A to lift R ft to get over heal and to stretch clothing in order to place L ft in. Pt able to pull clothing over knees.  Mod I for eating and grooming.      OT ADL Reviewed :   Yes    WATERS, OT, MANDI R - 04/16/2016 10:02 EDT   Taping/Bandaging/Strapping   Taping/Bandaging/Strapping Grid     Activity 1          Technique :    Adhesive taping              Rationale :    Pain management              Region :    Left shoulder              Tape Type :    Kinesiotape              Response :    Tolerated well              Comments  (Comment: Kinesiotape applied to L shld to assist with  decreasing pain.  [WATERS, OT, MANDI R - 04/16/2016 10:02 EDT] )         WATERS, OT, MANDI R - 04/16/2016 10:02 EDT         Assessment   OT Impairments or Limitations :   Balance deficits, Basic activity of daily living deficits, Coordination deficits, Endurance deficits, Equipment training, IADL deficits, Mobility deficits, Proprioception deficits, Safety awareness deficits, Strength deficits   Barriers to Safe Discharge OT :   Complicated medical history, Decreased communication, Limited family support, Limited social support, Medical diagnosis, Progressive nature of disease, Safety awareness, Severity of deficits   OT Discharge Recommendations :   ELOS 3 weeks then home with spouse and power w/c     OT Treatment Recommendations :   Pt continues to req skilled OT services to increase strength, AROM, mobility, standing abililty to assist with dressing, toileting, and mobility. OT applied Kinesiotape to L shld to assist with decreasing pain.      WATERS, OT, MANDI R - 04/16/2016 10:02 EDT   Time Spent With Patient   OT Time In :   7:30 EST   OT Time Out :   9:00 EST   OT ADL TRAINING 15 MIN :   6    OT ADL Training Minutes :   90 minutes   OT Total Individual Therapy Time :   90 minutes   OT Total Timed Code Treatment Units :   6 units   OT Total Timed Code Treatment Minutes :   90 minutes   OT Total Treatment Time Rehab :   90 minutes   WATERS, OT, MANDI R - 04/16/2016 10:02 EDT

## 2016-04-16 NOTE — Progress Notes (Signed)
Functional Indep Measure Scores - Text       Functional Independence Measure Scores Entered On:  04/16/2016 3:56 EDT    Performed On:  04/16/2016 3:56 EDT by Laural BenesJOHNSON, RN, AMY L               Eating Score   Patient's Independence Level With Eating Tasks :   Setup/Supervision   Supervision or Setup Needed :   Open containers, Pour liquid into containers, Prepare food for eating   Functional Independence Measure Eating :   Supervision or setup   JOHNSON, RN, AMY L - 04/16/2016 3:56 EDT   Bladder Management Score   Patient's Independence Level with Bladder Management Tasks :   Assistance   Type of Assistance Necessary :   Helper changes linen or clothing/cleans up spill   Functional Independence Measure Bladder Management :   Total assistance   JOHNSON, RN, AMY L - 04/16/2016 3:56 EDT   Bowel Management Score   Patient's Independence Level with Bowel Management Tasks :   Assistance   Type of Assistance Necessary :   Helper changes linen or clothing/cleans up spill   Functional Independence Measure Bowel Management :   Total assistance   JOHNSON, RN, AMY L - 04/16/2016 3:56 EDT

## 2016-04-16 NOTE — Progress Notes (Signed)
PT Inpatient Daily Documentation - Text       PT Inpatient Daily Documentation Entered On:  04/16/2016 15:13 EDT    Performed On:  04/16/2016 10:00 EDT by Kevan Ny, PTA, Melynda Ripple               Reason for Treatment   Subjective Statement :   pt agrees to sesson , pt ret to room c husbnad     *Reason for Referral :   Progressive MS (dx 1989)  new weakness following UTI and C-diff    3 hrs/day     *Chief Complaint :   weakness bilat LE's, tightness R ankle/calf     GATES, PTA, Melynda Ripple - 04/16/2016 15:37 EDT   Review/Treatments Provided   PT Goals :   PT Short Term Goals    04/14/2016  Bed Mobility Goal #1: Roll to right and left; Mod A; Progressing, continue  Transfer Goal #1: Transfer board; Mod A; Progressing, continue  Balance Goal #1: Unsupported short sit; Mod A; Static sitting; 2; Improve independence with activities of daily living; Progressing, continue     PT Plan :   Treatment Frequency:  Daily (modified)   Performed By: Eliseo Gum, PT, AMBER R  03/30/2016 09:30  Treatment Duration: 3 Performed By: Carney Living, AMBER R  03/30/2016 09:30  Planned Treatments: Balance training, Basic activities of daily living, Bed mobility training, Caregiver training, Electrical stimulation, Equipment training, Functional training, Manual therapy, Neuromuscular reeducation, Pain management, Posture/Body mechanics training,... Performed By: Carney Living, AMBER R  03/30/2016 09:30     Short Term Goals Reviewed :   Yes   Physical Therapy Orders :   Physical Therapy Inpatient Additional Treatment Rehab - 03/30/16 11:39:51 EDT, Balance training, Basic activities of daily living, Bed mobility training, Caregiver training, Electrical stimulation, Equipment training, Functional training, Manual therapy, Neuromuscular reeducation, Pain management, Posture/...  PT FIMS - 03/29/16 19:38:00 EDT, Daily     Pain Present :   Yes actual or suspected pain   PT Therapeutic Activity,Mobility,Balance :   Yes   PT Therapeutic Exercise :   Yes   GATES, PTA,  LEANNE M - 04/16/2016 15:37 EDT   Pain Assessment   Duration :   shld pain.   Self Report Pain :   FACES pain scale   GATES, Lillia Mountain - 04/16/2016 15:37 EDT   Therapeutic Activities/Mobility/Balance   Functional Activity :   Therapeutic Activities  Activity 1:  Bed mobility; Max A; Other: rolling, supine to sit EOB; Demonstrated positive response to treatment; Waffle mattress deflated for firmer support surface, still needing Max A w bed mobility.       Performed Date:  04/14/2016  Activity 2:  Transfer training; Max A; Supported short sit; Sliding board; Demonstrated positive response to treatment; bed>w/c w sliding board still requires Max A 2* decr balance, strength       Performed Date:  04/14/2016  Activity 3:  Balance; CGA; pt sitting up on EOB working on twisting at the waist looking from side to side, then shifting weight from side to side, and then raising B hands clasped and raising hands up/down 15 reps working on sitting balance, strength, and endurance.       Performed Date:  04/10/2016  Activity 4:  Transitional movement; lateral propped on L elbow to encourage appropriate trunk righting  and improve trunk ROM to L sidebending. Balance on L propped elbow with close SPV and VC for use of head/shoulders to control balance position.  Performed Date:  04/02/2016     Alger Memos - 04/16/2016 15:37 EDT   PT Therapeutic Activities Grid     Activity 3  Activity 4  Activity 1  Activity 2    Activity :          Bed mobility   Transfer training     Assist :             Maximal assistance     Response :             Increased activation of targeted muscle group(s)     Comment :    pt and husband participating in tx ed c roper staff. staff member able to assit in tx and tx set up c slide board. pt ed on assist c self directing care during tx ed on assisting roper staff memper c tx. both husband and wife ed on need to self direct tx    and act. however at end of session was infomred by [facility] OT  that roper staff member would need to look in to use of gurney for medical porgedure vs a tx to chair.    pt supione , pt on rolling tech, pt on need to lift head and reach. pt working on rolling L. pt perfomred x 5 bout c Rle felx and assist by PTA a hips. pt performing to work  on progression of bed mob skills. pt max assist c sup <>sit tx due to weekness.   pt c tx wc <>mat towradrs her L c use of slide board. pt c ed on board placment however placed c mod assist. pt performed wc<>mat tx x 2 reps c max\mod assist. pt c ed for head hip, progression of movment and assist at trunk due to weekness       GATES, Lillia Mountain - 04/16/2016 15:27 EDT  GATES, PTA, Melynda Ripple - 04/16/2016 15:27 EDT  GATES, PTA, Melynda Ripple - 04/16/2016 15:37 EDT  GATES, Lillia Mountain - 04/16/2016 15:37 EDT      Reassess Mobility :   Yes   GATES, PTA, Melynda Ripple - 04/16/2016 15:37 EDT   PT Mobility   PT Mobility Reviewed :   Yes   GATES, PTA, Alesia Banda M - 04/16/2016 15:27 EDT   GATES, PTA, Melynda Ripple - 04/16/2016 15:27 EDT   Mobility Grid   Roll Left :   Rehab Maximal assistance   Roll Right :   Rehab Maximal assistance   Roll Supine :   Rehab Maximal assistance   Supine to Sit :   Rehab Maximal assistance   Sit to Supine :   Rehab Maximal assistance   Scooting :   Rehab Total assistance   Transfer Bed to and From Chair :   Rehab Maximal assistance   Transfer Toilet :   Total assistance   Alger Memos - 04/16/2016 15:37 EDT   Functional Mobility Details :   Alger Memos - 04/16/2016 15:37 EDT   Amb Ability Varied Surf/Distraction Grid   Level Surfaces :   Does not occur   Uneven Surfaces :   Does not occur   Distracting Environments :   Does not occur   Curbs :   Does not occur   Stairs :   Does not occur   Ramp :   Does not occur   Alger Memos - 04/16/2016 15:37 EDT   Transfer Type :  Transfer board   Alger Memos - 04/16/2016 15:37 EDT   Therapeutic Exercise   Therapeutic Exercise RTF :   Therapeutic  Exercise  Exercise 1:  Lower extremity strengthening; Supported sit; Wheelchair; 10; Pt was instructed in L ankle pumps sitting in w/c for thrombosis prophylaxis and incr strength (unable w R)       Performed Date:  04/14/2016  Exercise 2:  shld retraction x 10.  APT strecth x 5 c 30 sec hold and lat trunk stretch to L x 5. pt performed all ex to assist c trunk and ue strength and rom to assist c cont progression and tol for fxn skilled act and to assist c progression of controled mobility       Performed Date:  04/11/2016  Exercise 3:  tilted back in power WC and reaching forward and grabing the therapists hand 2 sets of 5 reps c the R UE, 10 reps c the L UE active assisted; and then reaching c the L UE across the body 10 reps working on strengthening, coordination, and endurance.       Performed Date:  04/10/2016  Exercise 4:  sitting ankle pumps L LE for increasing ROM, strength, and endurance.       Performed Date:  04/10/2016     Alger Memos - 04/16/2016 15:37 EDT   Therapeutic Exercise Grid     Exercise 1          Comment :    sitting LLe ex c aarom hip fex, hip ext ankle act , and saq c arom \\aarom  and greaded limited manual resistance as tol to asist c inc strength pt c arom  x2 reps for jt mobilty to RLE                Alger Memos - 04/16/2016 15:37 EDT         Short Term Goals   Bed Mobility Goal Grid     Goal #1          Descriptors :    Roll to right and left              Level :    Moderate assistance              Status :    Progressing, continue                Alger Memos - 04/16/2016 15:37 EDT         Transfers Goal Grid     Goal #1          Descriptors :    Transfer board              Level :    Moderate assistance              Status :    Progressing, continue                GATES, PTA, Melynda Ripple - 04/16/2016 15:37 EDT         W/C Management Grid     Goal #1  Goal #2  Goal #3  Goal #4    Descriptors :    Power wheelchair mobility indoors   Pressure relief tilt in space  (power wheelchair)   Power wheelchair mobility indoors   Pressure relief tilt in space (power wheelchair)     Level :    Minimal assistance   Minimal assistance  Setup, Distant supervision   Modified independence     Status :    Goal met   Goal met   Goal met   Goal met     Date Met :    04/06/2016 EDT   04/06/2016 EDT   04/14/2016 EDT   04/14/2016 EDT       GATES, PTA, Alesia Banda M - 04/16/2016 15:37 EDT  GATES, PTA, Melynda Ripple - 04/16/2016 15:37 EDT  GATES, PTA, Melynda Ripple - 04/16/2016 15:37 EDT  GATES, PTA, Melynda Ripple - 04/16/2016 15:37 EDT      PT Balance Goal Grid     Goal #1          Descriptor :    Unsupported short sit              Assist Level :    Moderate assistance              Type :    Static sitting              Length of Time (minutes) :    2 minutes              Rationale :    Improve independence with activities of daily living              Status :    Progressing, continue                GATES, PTA, Melynda Ripple - 04/16/2016 15:37 EDT         PT ST Goals Reviewed :   Yes   GATES, PTA, Melynda Ripple - 04/16/2016 15:37 EDT   Assessment   PT Impairments or Limitations :   Abnormal tone, Balance deficits, Bed mobility deficits, Coordination/Proprioception deficits, Endurance deficits, Equipment training, Impaired sensation, Pain limiting function, Range of motion deficits, Strength deficits, Transfer deficits, Transition deficits, Wheelchair mobility deficits   Barriers to Safe Discharge PT :   Medical diagnosis   Discharge Recommendations :   ELOS: 3 weeks; home with assist and PWC     PT Treatment Recommendations :   pt and husband working on self directing care and teach back act.  pt cont to sruggle c head hip movment patterns. pt progressng trunk bal and ue placmetn for tx and bal. pt will cont to benfit from skilled pt maxmize ed and fam ed for safey c ret to home      GATES, PTA, Melynda Ripple - 04/16/2016 15:27 EDT   Time Spent With Patient   PT Time In :   10:00 EST   PT Time Out :   11:30 EST   PT ADL TRAINING 15 MN :    6 units   PT ADL Training Time :   90 minutes   PT Total Individual Therapy Time :   90 minutes   PT Total Timed Code Treatment Units :   6 units   PT Total Timed Code Tx Minutes :   90 minutes   PT Total Treatment Time Rehab :   90 minutes   GATES, PTA, Melynda Ripple - 04/16/2016 15:27 EDT

## 2016-04-16 NOTE — Progress Notes (Signed)
Functional Indep Measure Scores - Text       Functional Independence Measure Scores Entered On:  04/16/2016 20:17 EDT    Performed On:  04/16/2016 20:16 EDT by Katrinka BlazingSMITH, RN, KRISTINA K               Eating Score   Patient's Independence Level With Eating Tasks :   Setup/Supervision   Supervision or Setup Needed :   Cut food, Open containers, Prepare food for eating   Functional Independence Measure Eating :   Supervision or setup   [place]ITH, RN, Marzella SchleinKRISTINA K - 04/16/2016 20:16 EDT   Toileting Score   Patient's Independence Level with Toileting Tasks :   Assistance   Type of Assistance Necessary :   Assistance with toileting tasks   Amount of Assistance Needed :   Adjusting clothing before toileting, Adjusting clothing after toileting, Cleansing perineal area   Toileting :   Total assistance   [place], RN, Marzella SchleinKRISTINA K - 04/16/2016 20:16 EDT   Bladder Management Score   Patient's Independence Level with Bladder Management Tasks :   Assistance   Type of Assistance Necessary :   Helper changes diaper or absorbent pad   Functional Independence Measure Bladder Management :   Total assistance   [place]ITH, RN, Marzella SchleinKRISTINA K - 04/16/2016 20:16 EDT   Bowel Management Score   Patient's Independence Level with Bowel Management Tasks :   Assistance   Type of Assistance Necessary :   Helper positions and holds bedpan, assists patient to roll on AND off the bedpan, Helper changes linen or clothing/cleans up spill   Functional Independence Measure Bowel Management :   Total assistance   [place], RN, Marzella SchleinKRISTINA K - 04/16/2016 20:16 EDT   Transfer Bed/Chair/WC Score   Patient's independence Level with Bed, Chair, Wheelchair Tasks :   Assistance   Type of Assistance Necessary :   Lifting two legs, Lifting and lowering patient, Total assist (patient performs less than 25%)   Bed, Chair, Wheelchair Transfer :   Total assistance   Cheral MarkerSMITH, RN, KRISTINA K - 04/16/2016 20:16 EDT

## 2016-04-17 NOTE — Progress Notes (Signed)
Functional Indep Measure Scores - Text       Functional Independence Measure Scores Entered On:  04/17/2016 14:10 EDT    Performed On:  04/17/2016 14:09 EDT by Katrinka BlazingSMITH, RN, KRISTINA K               Eating Score   Patient's Independence Level With Eating Tasks :   Setup/Supervision   Supervision or Setup Needed :   Cut food, Open containers, Pour liquid into containers, Prepare food for eating   Functional Independence Measure Eating :   Supervision or setup   [place], RN, Marzella SchleinKRISTINA K - 04/17/2016 14:09 EDT   Toileting Score   Patient's Independence Level with Toileting Tasks :   Assistance   Type of Assistance Necessary :   Assistance with toileting tasks   Amount of Assistance Needed :   Adjusting clothing before toileting, Adjusting clothing after toileting, Cleansing perineal area   Toileting :   Total assistance   [place]ITH, [place], KRISTINA K - 04/17/2016 14:09 EDT   Bladder Management Score   Patient's Independence Level with Bladder Management Tasks :   Assistance   Type of Assistance Necessary :   Helper positions and holds bedpan, assists patient to roll on AND off the bedpan   Functional Independence Measure Bladder Management :   Total assistance   [place], RN, Marzella SchleinKRISTINA K - 04/17/2016 14:09 EDT   Bowel Management Score   Patient's Independence Level with Bowel Management Tasks :   Assistance   Type of Assistance Necessary :   Helper inserts suppository, Helper positions and holds bedpan, assists patient to roll on AND off the bedpan   Functional Independence Measure Bowel Management :   Total assistance   [place]ITH, RN, Marzella SchleinKRISTINA K - 04/17/2016 14:09 EDT   Transfer Bed/Chair/WC Score   Patient's independence Level with Bed, Chair, Wheelchair Tasks :   Assistance   Type of Assistance Necessary :   Lifting two legs, Total assist (patient performs less than 25%)   Bed, Chair, Wheelchair Transfer :   Total assistance   Cheral MarkerSMITH, RN, KRISTINA K - 04/17/2016 14:09 EDT

## 2016-04-17 NOTE — Nursing Note (Signed)
Medication Administration Follow Up-Text       Medication Administration Follow Up Entered On:  04/17/2016 22:00 EDT    Performed On:  04/17/2016 22:00 EDT by Wallace CullensGRAY, RN, HEATHER D      Intervention Information:     lorazepam  Performed by Wallace CullensGRAY, RN, HEATHER D on 04/17/2016 20:59:00 EDT       lorazepam,1mg   Oral,insomnia       Medication Effectiveness Evaluation   Medication Administration Reason :   Anxiety   Medication Effective :   Yes   Medication Response :   Symptoms improved, Continue to observe for symptoms   Wallace CullensGRAY, RN, HEATHER D - 04/17/2016 22:00 EDT

## 2016-04-17 NOTE — Progress Notes (Signed)
Functional Indep Measure Scores - Text       Functional Independence Measure Scores Entered On:  04/17/2016 5:46 EDT    Performed On:  04/17/2016 5:44 EDT by Clide Cliff, RN, Cyndia Diver               Eating Score   Patient's Independence Level With Eating Tasks :   Setup/Supervision   Supervision or Setup Needed :   Open containers, Pour liquid into containers, Prepare food for eating   Functional Independence Measure Eating :   Supervision or setup   Clide Cliff RN, Cyndia Diver - 04/17/2016 5:44 EDT   Toileting Score   Patient's Independence Level with Toileting Tasks :   Assistance   Type of Assistance Necessary :   Assistance with toileting tasks   Amount of Assistance Needed :   Adjusting clothing before toileting, Adjusting clothing after toileting, Cleansing perineal area   Toileting :   Total assistance   Bonnita Levan - 04/17/2016 5:44 EDT   Bladder Management Score   Patient's Independence Level with Bladder Management Tasks :   Assistance   Type of Assistance Necessary :   Helper positions and holds bedpan, assists patient to roll on AND off the bedpan   Functional Independence Measure Bladder Management :   Total assistance   Bonnita Levan - 04/17/2016 5:44 EDT   Bowel Management Score   Patient's Independence Level with Bowel Management Tasks :   Assistance   Type of Assistance Necessary :   Helper positions and holds bedpan, assists patient to roll on AND off the bedpan, Helper changes linen or clothing/cleans up spill, Helper changes diaper   Functional Independence Measure Bowel Management :   Total assistance   Bonnita Levan - 04/17/2016 5:44 EDT

## 2016-04-17 NOTE — Nursing Note (Signed)
Medication Administration Follow Up-Text       Medication Administration Follow Up Entered On:  04/17/2016 1:18 EDT    Performed On:  04/17/2016 1:18 EDT by Clide CliffBeddoe, RN, Cyndia Diverachel K      Intervention Information:     bisacodyl  Performed by Clide CliffBeddoe, RN, Cyndia Diverachel K on 04/17/2016 00:02:00 EDT       bisacodyl,10mg   PR,constipation       Medication Effectiveness Evaluation   Medication Administration Reason :   Constipation   Medication Effective :   Yes   Medication Response :   Symptoms improved   Clide CliffBeddoe, RN, Cyndia DiverRachel K - 04/17/2016 1:18 EDT

## 2016-04-18 NOTE — Progress Notes (Signed)
Functional Indep Measure Scores - Text       Functional Independence Measure Scores Entered On:  04/18/2016 23:16 EDT    Performed On:  04/18/2016 23:15 EDT by Wyvonna PlumHASELDEN, RN, DEBORAH L               Eating Score   Patient's Independence Level With Eating Tasks :   Independent/Modified independence   Independent or Modifications Needed :   Complete independence   Functional Independence Measure Eating :   Complete independence   HASELDEN, RN, DEBORAH L - 04/18/2016 23:15 EDT   Bladder Management Score   Patient's Independence Level with Bladder Management Tasks :   Assistance   Type of Assistance Necessary :   Helper positions and holds bedpan, assists patient to roll on AND off the bedpan, Helper initiates timed voiding schedule   Functional Independence Measure Bladder Management :   Total assistance   HASELDEN, RN, DEBORAH L - 04/18/2016 23:15 EDT   Bowel Management Score   Patient's Independence Level with Bowel Management Tasks :   Independent/Modified independence   InM Bowel Mgmt Indep or Modifications :   Requires medication at least once a week   Functional Independence Measure Bowel Management :   Modified independence   HASELDEN, RN, DEBORAH L - 04/18/2016 23:15 EDT   Transfer Bed/Chair/WC Score   Patient's independence Level with Bed, Chair, Wheelchair Tasks :   Assistance   Type of Assistance Necessary :   Two helpers needed   Bed, Chair, Wheelchair Transfer :   Total assistance   HASELDEN, RN, DEBORAH L - 04/18/2016 23:15 EDT

## 2016-04-18 NOTE — Progress Notes (Signed)
OT Inpatient Daily Documentation - Text       OT Inpatient Daily Documentation Entered On:  04/18/2016 12:15 EDT    Performed On:  04/17/2016 8:30 EDT by WATERS, OT, MANDI R               Reason for Treatment   *Reason for Referral :   Admitted for MS exacerbation    82 yo female with past medical history of progressive MS diagnosed in 1989, CVA with right sided weakness, hypothyroidism and neurogenic bladder admitted to [facility] in [place], [place] on 03/06/16 with recurrent generalized weakness of the upper and lower extremities and poor posture. She has been wheelchair bound for the past few years due to right-sided weakness.    (per pt, she did not have a CVA)       *Chief Complaint :   weakness     WATERS, OT, MANDI R - 04/19/2016 10:03 EDT     Review/Treatments Provided   OT Goals :   OT Short Term Goals    04/14/2016  Eating Goal #1: Manage food containers/packages; Distant S; Progressing, continue  Upper Body Dressing Goal #2: Upper extremity dressing; Minimal assistance; Initial  Lower Body Dressing Goal #1: Lower extremity dressing; Supine, Supported short sit, Unsupported short sit; Max A; Progressing, continue  Bathing Goal #2: Bathe; Mod A; Initial  Toileting and Transfers Goal #1: Toilet transfers; Max A; Bedside commode; Drop arm commode, Elevated toilet seat, Grab bars, Sliding board; Progressing, continue  Toileting and Transfers Goal #2: Toileting; Max A; Drop arm commode, Elevated toilet seat, Grab bars, Sliding board; Progressing, continue  Balance Goal #1: Unsupported short sit; Static sitting; CGA; 5; Improve independence with activities of daily living; Initial       OT Plan :   Treatment Frequency: Daily Performed By: Carilyn Goodpasture M   03/30/2016  Treatment Duration: 3 Performed By: Katherine Roan   03/30/2016  Planned Treatments: Balance training, Basic Activities of Daily Living, Caregiver training, Coordination, Energy conservation training, Equipment training, Group therapy,  Mobility training, Patient education, Safety education, Therapeutic activities, Therapeutic exercises... Performed By: Katherine Roan   03/30/2016       Occupational Therapy Orders :   Occupational Therapy Medical Hold - 04/18/16 12:00:00 EDT, Stop date 04/18/16 12:00:00 EDT, for appointment at urologist's office this afternoon  Occupational Therapy Inpatient Additional Treatment Rehab - 03/30/16 16:40:41 EDT, Balance training, Basic Activities of Daily Living, Caregiver training, Coordination, Energy conservation training, Equipment training, Group therapy, Mobility training, Patient education, Safety education, Therapeutic activitie...  OT FIMS - 03/29/16 19:38:00 EDT, Daily       Pain Present :   Yes actual or suspected pain     OT Therapeutic Activity,Mobility,Balance :   Yes     OT Modalities :   Yes     Taping/Bandaging/  Strapping :   Yes   WATERS, OT, MANDI R - 04/19/2016 10:03 EDT     Pain Assessment   Pain Location :   Shoulder     Laterality :   Left     Self Report Pain :   FACES pain scale   WATERS, OT, MANDI R - 04/19/2016 10:03 EDT     Therapeutic Activities   OT Therapeutic Activities RTF :   Therapeutic Activities  Activity 1:  Reaching activities; Pt completed reaching activity with BUE from seated position in w/c with BUE supports removed. Pt demonstrates significantly better grip in L hand compared  to R. Pt practiced reaching and grasping cones using wrist extension to facilitate grasp.       Performed Date:  04/14/2016  Activity 2:  Reaching anteriorly, Other: core strengthening/dynamic sitting balance; Mod A; Sitting EOM, pt anteriorly reached to touch ball to ground using BUEs for 5 reps x 2 sets with Min A for control for trunk flexion and Mod A for trunk extension to come to upright sitting posture.  Task to incr core strength/dynamic sitting balance       Performed Date:  04/10/2016  Activity 3:  Transitional movement; Mod A; Sitting in wheelchair, Supported short sit; Improved  stability in muscle group(s), Improved timing, control, and coordination, Increased activation of targeted muscle group(s), Integrated muscular activation into functional activity; pt performed lat scoot transfer with gait belt around hips and sliding board from w/c <> EOM x2.  Pt's husband performed trasnfers 1x with CGA from OT after ed.       Performed Date:  04/09/2016  Activity 4:  Other: Dynamic sitting balance; Minimal assistance; Supported short sit, Unsupported short sit; Other: Ball/bean bags; Improved stability in muscle group(s), Improved timing, control, and coordination, Increased activation of targeted muscle group(s), Integrated muscular activation into functional activity; Pt performed anterior reaching to place bean bags into container on table top with occassional assist to maintain balance  when reachign with RUE.  Task to increase dynamic sitting balance needed for greater I with transfers and ADL performance.       Performed Date:  04/09/2016  Activity 5:  Improved stability in muscle group(s), Improved timing, control, and coordination, Increased activation of targeted muscle group(s), Integrated muscular activation into functional activity       Performed Date:  04/09/2016     Byrd Hesselbach, OT, MANDI R - 04/19/2016 10:03 EDT     OT Therapeutic Activities Grid     Activity 1          Comments :    While seated EOM, pt completed upright sitting with mod A. E-stim placed to B erectors, R obliques, and R tricep to increase upright posture in static sitting. Pt completed 3x10 reps of contract/release to increase strength. WATERS, OT, MANDI R - 04/19/2016 10:03 EDT                WATERS, OT, MANDI R - 04/18/2016 12:09 EDT         Modalities   Modalities     Activity 1          Modality :    Electrical stimulation, attended WATERS, OT, MANDI R - 04/19/2016 10:03 EDT              Body Region :    see theract WATERS, OT, MANDI R - 04/19/2016 10:03 EDT              Response :    Tolerated well WATERS, OT,  MANDI R - 04/19/2016 10:03 EDT                WATERS, OT, MANDI R - 04/18/2016 12:09 EDT         Taping/Bandaging/Strapping   Taping/Bandaging/Strapping Grid     Activity 1          Technique :    Adhesive taping WATERS, OT, MANDI R - 04/19/2016 10:03 EDT              Rationale :    Pain management WATERS, OT, MANDI R - 04/19/2016 10:03 EDT  Region :    Left shoulder, Other: B sides of neck WATERS, OT, MANDI R - 04/19/2016 10:03 EDT              Tape Type :    Kinesiotape WATERS, OT, MANDI R - 04/19/2016 10:03 EDT              Response :    Tolerated well WATERS, OT, MANDI R - 04/19/2016 10:03 EDT              Comments  (Comment: OT applied Kinesiotape to R traps to facilitate, L traps to inhibit, and L shld posterior correction with scap retraction.  [WATERS, OT, MANDI R - 04/19/2016 10:03 EDT] )         WATERS, OT, MANDI R - 04/18/2016 12:09 EDT         Assessment   OT Impairments or Limitations :   Balance deficits, Basic activity of daily living deficits, Coordination deficits, Endurance deficits, Equipment training, IADL deficits, Mobility deficits, Proprioception deficits, Safety awareness deficits, Strength deficits     Barriers to Safe Discharge OT :   Complicated medical history, Decreased communication, Limited family support, Limited social support, Medical diagnosis, Progressive nature of disease, Safety awareness, Severity of deficits     OT Discharge Recommendations :   ELOS 3 weeks then home with spouse and power w/c       OT Treatment Recommendations :   Pt continues to req skilled OT services to increase strength, endurance, AROM, mobility, and transfers.      WATERS, OT, MANDI R - 04/19/2016 10:03 EDT     Time Spent With Patient   OT Time In :   8:30 EST     OT Time Out :   10:00 EST     OT Neuromuscular Reeducation Units :   6 units     OT Neuromuscular Reeducation Time :   90 minutes     OT Total Individual Therapy Time :   90 minutes     OT Total Timed Code Treatment Units :   6  units     OT Total Timed Code Treatment Minutes :   90 minutes     OT Total Treatment Time Rehab :   90 minutes   WATERS, OT, MANDI R - 04/19/2016 10:03 EDT

## 2016-04-18 NOTE — Progress Notes (Signed)
Functional Indep Measure Scores - Text       Functional Independence Measure Scores Entered On:  04/18/2016 16:54 EDT    Performed On:  04/18/2016 16:52 EDT by Katrinka BlazingSMITH, RN, KRISTINA K               Eating Score   Patient's Independence Level With Eating Tasks :   Setup/Supervision   Supervision or Setup Needed :   Cut food, Open containers, Prepare food for eating   Functional Independence Measure Eating :   Supervision or setup   TennilleSMITH, RN, Marzella SchleinKRISTINA K - 04/18/2016 16:52 EDT   Toileting Score   Patient's Independence Level with Toileting Tasks :   Assistance   Type of Assistance Necessary :   Assistance with toileting tasks   Amount of Assistance Needed :   Adjusting clothing before toileting, Adjusting clothing after toileting, Cleansing perineal area   Toileting :   Total assistance   Blue RidgeSMITH, RN, Marzella SchleinKRISTINA K - 04/18/2016 16:52 EDT   Bladder Management Score   Patient's Independence Level with Bladder Management Tasks :   Assistance   Type of Assistance Necessary :   Helper positions and holds bedpan, assists patient to roll on AND off the bedpan, Helper changes diaper or absorbent pad   Functional Independence Measure Bladder Management :   Total assistance   ThrustonSMITH, RN, Marzella SchleinKRISTINA K - 04/18/2016 16:52 EDT   Bowel Management Score   Patient's Independence Level with Bowel Management Tasks :   Assistance   Type of Assistance Necessary :   Helper positions and holds bedpan, assists patient to roll on AND off the bedpan, Helper changes diaper   Functional Independence Measure Bowel Management :   Total assistance   BruniSMITH, RN, Marzella SchleinKRISTINA K - 04/18/2016 16:52 EDT   Transfer Bed/Chair/WC Score   Patient's independence Level with Bed, Chair, Wheelchair Tasks :   Assistance   Type of Assistance Necessary :   Lifting and lowering patient, Total assist (patient performs less than 25%)   Bed, Chair, Wheelchair Transfer :   Total assistance   Cheral MarkerSMITH, RN, KRISTINA K - 04/18/2016 16:52 EDT

## 2016-04-18 NOTE — Progress Notes (Signed)
Functional Indep Measure Scores - Text       Functional Independence Measure Scores Entered On:  04/18/2016 12:16 EDT    Performed On:  04/18/2016 8:30 EDT by WATERS, OT, MANDI R               Comprehension Score   Mode of Comprehension :   Auditory   Comprehends Complex or Abstract Information Without Prompting or Cueing :   Yes   Understands Complex or Abstract Directions and Conversations :   At all times   Comprehension Indep Measure Interim :   Complete independence   WATERS, OT, MANDI R - 04/18/2016 12:15 EDT   Expression Score   Expression Mode :   Vocal   Expresses Complex or Abstract Information Without Prompting or Cueing :   Yes   Expresses Complex or Abstract Ideas :   Clearly and fluently at all times   Expression Indep Measure Interim :   Complete independence   WATERS, OT, MANDI R - 04/18/2016 12:15 EDT   Social Interaction Score   Interacts Appropriately Without Supervision :   Yes   Interacts Appropriately :   At all times   Social Interaction Indep Measure Interim :   Complete independence   WATERS, OT, MANDI R - 04/18/2016 12:15 EDT   Problem Solving Score   Solves Complex Problems :   Yes   Ability to Solve Complex Problems :   Consistently solves problems independently   Problem Solving Indep Measure Interim :   Complete independence   WATERS, OT, MANDI R - 04/18/2016 12:15 EDT   Memory Score   Recognizes, Remembers Routines, and Executes Requests Without Prompting :   Yes   Remembers and Executes Requests :   Consistently without need for repetition   Memory Indep Measure Interim :   Complete independence   WATERS, OT, MANDI R - 04/18/2016 12:15 EDT

## 2016-04-18 NOTE — Progress Notes (Signed)
OT Inpatient Daily Documentation - Text       OT Inpatient Daily Documentation Entered On:  04/18/2016 12:24 EDT    Performed On:  04/18/2016 8:30 EDT by WATERS, OT, MANDI R               Reason for Treatment   *Reason for Referral :   Admitted for MS exacerbation    82 yo female with past medical history of progressive MS diagnosed in 1989, CVA with right sided weakness, hypothyroidism and neurogenic bladder admitted [facility] [place] on 03[place]06/16 with recurrent generalized weakness of the upper and lower extremities and poor posture. She has been wheelchair bound for the past few years due to right-sided weakness.    (per pt, she did not have a CVA)     *Chief Complaint :   weakness     WATERS, OT, MANDI R - 04/18/2016 12:16 EDT   Review/Treatments Provided   OT Goals :   OT Short Term Goals    04/14/2016  Eating Goal #1: Manage food containers/packages; Distant S; Progressing, continue  Upper Body Dressing Goal #2: Upper extremity dressing; Minimal assistance; Initial  Lower Body Dressing Goal #1: Lower extremity dressing; Supine, Supported short sit, Unsupported short sit; Max A; Progressing, continue  Bathing Goal #2: Bathe; Mod A; Initial  Toileting and Transfers Goal #1: Toilet transfers; Max A; Bedside commode; Drop arm commode, Elevated toilet seat, Grab bars, Sliding board; Progressing, continue  Toileting and Transfers Goal #2: Toileting; Max A; Drop arm commode, Elevated toilet seat, Grab bars, Sliding board; Progressing, continue  Balance Goal #1: Unsupported short sit; Static sitting; CGA; 5; Improve independence with activities of daily living; Initial     OT Plan :   Treatment Frequency: Daily Performed By: Carilyn GoodpastureHOBBY, OT, LESLIE M   03/30/2016  Treatment Duration: 3 Performed By: Katherine RoanHOBBY, OT, LESLIE M   03/30/2016  Planned Treatments: Balance training, Basic Activities of Daily Living, Caregiver training, Coordination, Energy conservation training, Equipment training, Group therapy, Mobility  training, Patient education, Safety education, Therapeutic activities, Therapeutic exercises... Performed By: Katherine RoanHOBBY, OT, LESLIE M   03/30/2016     Occupational Therapy Orders :   Occupational Therapy Medical Hold - 04/18/16 12:00:00 EDT, Stop date 04/18/16 12:00:00 EDT, for appointment at urologist's office this afternoon  Occupational Therapy Inpatient Additional Treatment Rehab - 03/30/16 16:40:41 EDT, Balance training, Basic Activities of Daily Living, Caregiver training, Coordination, Energy conservation training, Equipment training, Group therapy, Mobility training, Patient education, Safety education, Therapeutic activitie...  OT FIMS - 03/29/16 19:38:00 EDT, Daily     Pain Present :   Yes actual or suspected pain   OT Therapeutic Exercise :   Yes   Taping/Bandaging/  Strapping :   Yes   WATERS, OT, MANDI R - 04/18/2016 12:16 EDT   Pain Assessment   Pain Location :   Shoulder   Laterality :   Left   Self Report Pain :   FACES pain scale   WATERS, OT, MANDI R - 04/18/2016 12:16 EDT   Therapeutic Exercise   Therapeutic Exercise RTF :   Therapeutic Exercise  Exercise 1:  Issued, educated, and demonstrated B UE therex with yellow theraband to increase all shld and rotator cuff muscles. Using gravity when unable to use theraband due to pain or decreased strength.       Performed Date:  04/15/2016  Exercise 2:  with L hand and increased time due to fatigue and decreased strength. Placed key  shaped pegs in holes with increased difficulty and L hand.       Performed Date:  04/04/2016     WATERS, OT, [place] R - 04/18/2016 12:16 EDT   OT Therapeutic Exercise Grid     Exercise 1          Comments :    Pt completed inset peg board using tongs with L hand and no tongs with R hand and increased difficulty and time. Pt completed scap retrat therex picking up pegs and placing in bucket. Pt req max vc's to keep head in neutral position.                 WATERS, OT, MANDI R - 04/18/2016 12:16 EDT          Taping/Bandaging/Strapping   Taping/Bandaging/Strapping Grid     Activity 1          Technique :    Adhesive taping              Rationale :    Pain management              Region :    Left shoulder              Tape Type :    Kinesiotape              Response :    Tolerated well              Comments  (Comment: OT applied Kinesiotape to L shld post  and scap retraction and shld sublux.  [WATERS, OT, MANDI R - 04/18/2016 12:16 EDT] )         WATERS, OT, MANDI R - 04/18/2016 12:16 EDT         Assessment   OT Impairments or Limitations :   Balance deficits, Basic activity of daily living deficits, Coordination deficits, Endurance deficits, Equipment training, IADL deficits, Mobility deficits, Proprioception deficits, Safety awareness deficits, Strength deficits   Barriers to Safe Discharge OT :   Complicated medical history, Decreased communication, Limited family support, Limited social support, Medical diagnosis, Progressive nature of disease, Safety awareness, Severity of deficits   OT Discharge Recommendations :   ELOS 3 weeks then home with spouse and power w/c     OT Treatment Recommendations :   Pt continues to req skilled OT service to increase strength, AROM, endurance, transfers, mobility, and ADL's.     WATERS, OT, MANDI R - 04/18/2016 12:16 EDT   Time Spent With Patient   OT Time In :   8:30 EST   OT Time Out :   10:00 EST   OT Neuromuscular Reeducation Units :   6 units   OT Neuromuscular Reeducation Time :   90 minutes   OT Total Individual Therapy Time :   90 minutes   OT Total Timed Code Treatment Units :   6 units   OT Total Timed Code Treatment Minutes :   90 minutes   OT Total Treatment Time Rehab :   90 minutes   WATERS, OT, MANDI R - 04/18/2016 12:16 EDT

## 2016-04-18 NOTE — Progress Notes (Signed)
Functional Indep Measure Scores - Text       Functional Independence Measure Scores Entered On:  04/18/2016 12:09 EDT    Performed On:  04/17/2016 8:30 EDT by WATERS, OT, MANDI R               Comprehension Score   Mode of Comprehension :   Auditory     Comprehends Complex or Abstract Information Without Prompting or Cueing :   Yes     Understands Complex or Abstract Directions and Conversations :   At all times     Comprehension Indep Measure Interim :   Complete independence   WATERS, OT, MANDI R - 04/19/2016 10:03 EDT     Expression Score   Expression Mode :   Vocal     Expresses Complex or Abstract Information Without Prompting or Cueing :   Yes     Expresses Complex or Abstract Ideas :   Clearly and fluently at all times     Expression Indep Measure Interim :   Complete independence   WATERS, OT, MANDI R - 04/19/2016 10:03 EDT     Social Interaction Score   Interacts Appropriately Without Supervision :   Yes     Interacts Appropriately :   At all times     Social Interaction Indep Measure Interim :   Complete independence   WATERS, OT, MANDI R - 04/19/2016 10:03 EDT     Problem Solving Score   Solves Complex Problems :   Yes     Ability to Solve Complex Problems :   Consistently solves problems independently     Problem Solving Indep Measure Interim :   Complete independence   WATERS, OT, MANDI R - 04/19/2016 10:03 EDT     Memory Score   Recognizes, Remembers Routines, and Executes Requests Without Prompting :   Yes     Remembers and Executes Requests :   Consistently without need for repetition     Memory Indep Measure Interim :   Complete independence   WATERS, OT, MANDI R - 04/19/2016 10:03 EDT

## 2016-04-18 NOTE — Progress Notes (Signed)
PT Inpatient Daily Documentation - Text       PT Inpatient Daily Documentation Entered On:  04/18/2016 10:26 EDT    Performed On:  04/17/2016 10:00 EDT by Eliseo Gum, PT, AMBER R               Reason for Treatment   *Reason for Referral :   Progressive MS (dx 1989)  new weakness following UTI and C-diff    3 hrs/day     *Chief Complaint :   weakness bilat LE's, tightness R ankle/calf     BENTON, PT, AMBER R - 04/18/2016 10:21 EDT   Review/Treatments Provided   PT Goals :   PT Short Term Goals    04/16/2016  Bed Mobility Goal #1: Roll to right and left; Mod A; Progressing, continue  Transfer Goal #1: Transfer board; Mod A; Progressing, continue  Balance Goal #1: Unsupported short sit; Mod A; Static sitting; 2; Improve independence with activities of daily living; Progressing, continue     PT Plan :   Treatment Frequency:  Daily (modified)   Performed By: Eliseo Gum, PT, AMBER R  03/30/2016 09:30  Treatment Duration: 3 Performed By: Carney Living, AMBER R  03/30/2016 09:30  Planned Treatments: Balance training, Basic activities of daily living, Bed mobility training, Caregiver training, Electrical stimulation, Equipment training, Functional training, Manual therapy, Neuromuscular reeducation, Pain management, Posture/Body mechanics training,... Performed By: Carney Living, AMBER R  03/30/2016 09:30     Short Term Goals Reviewed :   Yes   Physical Therapy Orders :   Physical Therapy Medical Hold - 04/18/16 12:00:00 EDT, Stop date 04/18/16 12:00:00 EDT, for appt. at urologist's office this afternoon  Physical Therapy Inpatient Additional Treatment Rehab - 03/30/16 11:39:51 EDT, Balance training, Basic activities of daily living, Bed mobility training, Caregiver training, Electrical stimulation, Equipment training, Functional training, Manual therapy, Neuromuscular reeducation, Pain management, Posture/...  PT FIMS - 03/29/16 19:38:00 EDT, Daily     Pain Present :   No actual or suspected pain   BENTON, PT, AMBER R - 04/18/2016 10:21  EDT   Short Term Goals   Bed Mobility Goal Grid     Goal #1          Descriptors :    Roll to right and left              Level :    Moderate assistance              Status :    Progressing, continue                BENTON, PT, AMBER R - 04/18/2016 10:21 EDT         Transfers Goal Grid     Goal #1          Descriptors :    Transfer board              Level :    Moderate assistance              Status :    Progressing, continue                BENTON, PT, AMBER R - 04/18/2016 10:21 EDT         W/C Management Grid     Goal #1  Goal #2  Goal #3  Goal #4    Descriptors :    Power wheelchair mobility indoors   Pressure relief tilt in space (power wheelchair)   Power wheelchair  mobility indoors   Pressure relief tilt in space (power wheelchair)     Level :    Minimal assistance   Minimal assistance   Setup, Distant supervision   Modified independence     Status :    Goal met   Goal met   Goal met   Goal met     Date Met :    04/06/2016 EDT   04/06/2016 EDT   04/14/2016 EDT   04/14/2016 EDT       BENTON, PT, AMBER R - 04/18/2016 10:21 EDT  BENTON, PT, AMBER R - 04/18/2016 10:21 EDT  BENTON, PT, AMBER R - 04/18/2016 10:21 EDT  BENTON, PT, AMBER R - 04/18/2016 10:21 EDT      PT Balance Goal Grid     Goal #1          Descriptor :    Unsupported short sit              Assist Level :    Moderate assistance              Type :    Static sitting              Length of Time (minutes) :    2 minutes              Rationale :    Improve independence with activities of daily living              Status :    Progressing, continue                BENTON, PT, AMBER R - 04/18/2016 10:21 EDT         PT ST Goals Reviewed :   Yes   BENTON, PT, AMBER R - 04/18/2016 10:21 EDT   Assessment   PT Impairments or Limitations :   Abnormal tone, Balance deficits, Bed mobility deficits, Coordination/Proprioception deficits, Endurance deficits, Equipment training, Impaired sensation, Pain limiting function, Range of motion deficits, Strength deficits, Transfer  deficits, Transition deficits, Wheelchair mobility deficits   Barriers to Safe Discharge PT :   Medical diagnosis   Discharge Recommendations :   ELOS: 3 weeks; home with assist and PWC     PT Treatment Recommendations :   PT session spent edu patient and family on dependent transfer training skills for MD appt tomororw. After speaking with MD office and verbally discussed set up of equipment, do not recommend transfer to equipment due to total assist transfer and poor set up ability due to confines of room. She will benefit from skilled PT to address above impairments and progress indep with functional mobility and transfers.      BENTON, PT, AMBER R - 04/18/2016 10:21 EDT   Time Spent With Patient   PT Time In :   10:00 EST   PT Time Out :   11:30 EST   PT Therapeutic Exercise Units :   0 units   PT Therapeutic Exercise Time :   0 minutes   PT Total Individual Therapy Time :   0 minutes   PT Total Timed Code Treatment Units :   0 units   PT Total Timed Code Tx Minutes :   0 minutes   PT Total Treatment Time Rehab :   0 minutes   BENTON, PT, AMBER R - 04/18/2016 10:21 EDT   Additional Information   Additional Information PT :   Lengthy discussion with MD office re:  set up of room for procedure scheduled tomorrow. It was determined that room is too small for any type of wheelchair to fit into procedure room. MD office plan to lift pt from [facility] in doorway and carry to procedure table. Voiced concerns for safety of patient and staff. Plan to proceed with procedure. Edu pt and family on proper 2 person dependent lift technique with video instruction to decrease risk of falls and injury during transfers.      BENTON, PT, AMBER R - 04/18/2016 10:21 EDT

## 2016-04-18 NOTE — Progress Notes (Signed)
Functional Indep Measure Scores - Text       Functional Independence Measure Scores Entered On:  04/18/2016 12:21 EDT    Performed On:  04/18/2016 12:19 EDT by Beverely RisenGATES, PTA, Melynda RippleLEANNE M               Transfer Bed/Chair/WC Score   Patient's independence Level with Bed, Chair, Wheelchair Tasks :   Assistance   Type of Assistance Necessary :   Assistance for more than half (patient performs 25% - 49% of tasks)   Bed, Chair, Wheelchair Transfer :   Maximal assistance   Alger MemosGATES, PTA, LEANNE M - 04/18/2016 12:19 EDT   Comprehension Score   Mode of Comprehension :   Auditory   Comprehends Complex or Abstract Information Without Prompting or Cueing :   Yes   Understands Complex or Abstract Directions and Conversations :   At all times   Comprehension Indep Measure Interim :   Complete independence   GATES, PTA, Melynda RippleLEANNE M - 04/18/2016 12:19 EDT   Expression Score   Expression Mode :   Vocal   Expresses Complex or Abstract Information Without Prompting or Cueing :   Yes   Expresses Complex or Abstract Ideas :   Clearly and fluently at all times   Expression Indep Measure Interim :   Complete independence   Alger MemosGATES, PTA, LEANNE M - 04/18/2016 12:19 EDT   Social Interaction Score   Interacts Appropriately Without Supervision :   Yes   Interacts Appropriately :   At all times   Social Interaction Indep Measure Interim :   Complete independence   Alger MemosGATES, PTA, LEANNE M - 04/18/2016 12:19 EDT   Problem Solving Score   Solves Complex Problems :   Yes   Ability to Solve Complex Problems :   Consistently solves problems independently   Problem Solving Indep Measure Interim :   Complete independence   Alger MemosGATES, PTA, LEANNE M - 04/18/2016 12:19 EDT

## 2016-04-18 NOTE — Progress Notes (Signed)
 PT Inpatient Daily Documentation - Text       PT Inpatient Daily Documentation Entered On:  04/18/2016 12:29 EDT    Performed On:  04/18/2016 10:00 EDT by DELICE, PTA, LEANNE M               Reason for Treatment   Subjective Statement :   pt handed off to PT from OT, pt set up for tx out of wc. pt then states need for bathroom.  at end of session. pt ret to room and handed off to PCT      *Reason for Referral :   Progressive MS (dx 1989)  new weakness following UTI and C-diff    3 hrs/day     *Chief Complaint :   weakness bilat LE's, tightness R ankle/calf     GATES, PTA, LEANNE M - 04/18/2016 13:02 EDT   Review/Treatments Provided   PT Goals :   PT Short Term Goals    04/17/2016  Bed Mobility Goal #1: Roll to right and left; Mod A; Progressing, continue  Transfer Goal #1: Transfer board; Mod A; Progressing, continue  Balance Goal #1: Unsupported short sit; Mod A; Static sitting; 2; Improve independence with activities of daily living; Progressing, continue     PT Plan :   Treatment Frequency:  Daily (modified)   Performed By: LORELLA, PT, AMBER R  03/30/2016 09:30  Treatment Duration: 3 Performed By: LORELLA KLEIN, AMBER R  03/30/2016 09:30  Planned Treatments: Balance training, Basic activities of daily living, Bed mobility training, Caregiver training, Electrical stimulation, Equipment training, Functional training, Manual therapy, Neuromuscular reeducation, Pain management, Posture/Body mechanics training,... Performed By: LORELLA KLEIN, AMBER R  03/30/2016 09:30     Short Term Goals Reviewed :   Yes   Physical Therapy Orders :   Physical Therapy Medical Hold - 04/18/16 12:00:00 EDT, Stop date 04/18/16 12:00:00 EDT, for appt. at urologist's office this afternoon  Physical Therapy Inpatient Additional Treatment Rehab - 03/30/16 11:39:51 EDT, Balance training, Basic activities of daily living, Bed mobility training, Caregiver training, Electrical stimulation, Equipment training, Functional training, Manual therapy,  Neuromuscular reeducation, Pain management, Posture/...  PT FIMS - 03/29/16 19:38:00 EDT, Daily     Pain Present :   Yes actual or suspected pain   PT Therapeutic Activity,Mobility,Balance :   Yes   GATES, PTA, LEANNE M - 04/18/2016 13:02 EDT   Pain Assessment   Duration :   shld pain   Self Report Pain :   FACES pain scale   GATES, PTA, LEANNE M - 04/18/2016 13:02 EDT   Therapeutic Activities/Mobility/Balance   Reassess Mobility :   Yes   GATES, PTA, LEANNE M - 04/18/2016 12:56 EDT   Functional Activity :   Therapeutic Activities  Activity 1:  Bed mobility; pt supione , pt on rolling tech, pt on need to lift head and reach. pt working on rolling L. pt perfomred x 5 bout c Rle felx and assist by PTA a hips. pt performing to work  on progression of bed mob skills. pt max assist c sup <>sit tx due to weekness.       Performed Date:  04/16/2016  Activity 2:  Transfer training; Max A; Increased activation of targeted muscle group(s); pt c tx wc <>mat towradrs her L c use of slide board. pt c ed on board placment however placed c mod assist. pt performed wc<>mat tx x 2 reps c maxassist. pt c ed for head hip, progression of movment and  assist at trunk due to weekness       Performed Date:  04/16/2016  Activity 3:  pt and husband participating in tx ed c roper staff. staff member able to assit in tx and tx set up c slide board. pt ed on assist c self directing care during tx ed on assisting roper staff memper c tx. both husband and wife ed on need to self direct tx       Performed Date:  04/16/2016  Activity 4:  and act. however at end of session was infomred by [facility] OT that roper staff member would need to look in to use of gurney for medical porgedure vs a tx to chair.       Performed Date:  04/16/2016     GATES, PTA, LEANNE M - 04/18/2016 13:02 EDT   PT Therapeutic Activities Grid     Activity 1  Activity 2  Activity 3  Activity 4    Activity :    Transfer training, Other: toilet \\shower  chiar   Balance   Dynamic  standing balance        Assist :       Moderate assistance           Equipment :    Sliding board, Other: shower chiar      Other: balloone        Response :    Demonstrated positive response to treatment, Tolerated well   Required rest breaks, Tolerated well   Decreased endurance, Increased activation of targeted muscle group(s), Required rest breaks, Tolerated well        Comment :    pt c max assist of one, however due to clothes mgt pt total assist for toilet tx on \\off  shower chiar. pt c mod vc for leaning R on wc and L bed to assist c clothes mgt, and to assist c safe pt posistioning.   while on shower chiar. pt working on [employer] trunk up right posistiong to alow doffing of pants and shirt and donning of dress for procedure being perfomed latter in day. pt c some assist comming up to sit after side bending once fatigued.   pt sitting in PWC c back rest reclined away to dec pt pt support, pt alos c arm rest removed on R side 2'to being placed along mat. pt worke don ball act, c vc for bue use. pt able to catch bal at center and near center of body. pt unable to perfomred   catches off center due to due due ue rom, poor righting reactions due to dec trunk strenth. pt working on act to [employer], strength and sitting bal       GATES, JOSETTA DAMIEN HERO - 04/18/2016 13:02 EDT  GATES, PTA, DAMIEN HERO - 04/18/2016 13:02 EDT  GATES, PTA, DAMIEN HERO - 04/18/2016 13:02 EDT  GATES, PTA, DAMIEN HERO - 04/18/2016 13:02 EDT      PT Mobility   Mobility Grid   Roll Left :   Rehab Maximal assistance   Roll Right :   Rehab Maximal assistance   Roll Supine :   Rehab Maximal assistance   Supine to Sit :   Rehab Maximal assistance   Sit to Supine :   Rehab Maximal assistance   Scooting :   Rehab Total assistance   Transfer Bed to and From Chair :   Rehab Maximal assistance   Transfer Toilet :   Total assistance  GATES, PTA, LEANNE M - 04/18/2016 12:56 EDT   Functional Mobility Details :   .   GATES, PTA, LEANNE M -  04/18/2016 12:56 EDT   Amb Ability Varied Surf/Distraction Grid   Level Surfaces :   Does not occur   Uneven Surfaces :   Does not occur   Distracting Environments :   Does not occur   Curbs :   Does not occur   Stairs :   Does not occur   Ramp :   Does not occur   GATES, PTA, LEANNE M - 04/18/2016 12:56 EDT   Transfer Type :   Transfer board   PT Mobility Reviewed :   Yes   DELICE JOSETTA DAMIEN CHRISTELLA - 04/18/2016 12:56 EDT   Short Term Goals   Bed Mobility Goal Grid     Goal #1          Descriptors :    Roll to right and left              Level :    Moderate assistance              Status :    Progressing, continue                GATES, PTA, LEANNE M - 04/18/2016 13:02 EDT         Transfers Goal Grid     Goal #1          Descriptors :    Transfer board              Level :    Moderate assistance              Status :    Progressing, continue                GATES, PTA, LEANNE M - 04/18/2016 13:02 EDT         W/C Management Grid     Goal #1  Goal #2  Goal #3  Goal #4    Descriptors :    Power wheelchair mobility indoors   Pressure relief tilt in space (power wheelchair)   Power wheelchair mobility indoors   Pressure relief tilt in space (power wheelchair)     Level :    Minimal assistance   Minimal assistance   Setup, Distant supervision   Modified independence     Status :    Goal met   Goal met   Goal met   Goal met     Date Met :    04/06/2016 EDT   04/06/2016 EDT   04/14/2016 EDT   04/14/2016 EDT       GATES, PTA, DAMIEN M - 04/18/2016 13:02 EDT  GATES, PTA, DAMIEN CHRISTELLA - 04/18/2016 13:02 EDT  GATES, PTA, DAMIEN CHRISTELLA - 04/18/2016 13:02 EDT  GATES, PTA, DAMIEN CHRISTELLA - 04/18/2016 13:02 EDT      PT Balance Goal Grid     Goal #1          Descriptor :    Unsupported short sit              Assist Level :    Moderate assistance              Type :    Static sitting              Length of Time (minutes) :    2 minutes  Rationale :    Improve independence with activities of daily living              Status :    Progressing,  continue                GATES, PTA, LEANNE M - 04/18/2016 13:02 EDT         PT ST Goals Reviewed :   Yes   GATES, PTA, DAMIEN HERO - 04/18/2016 13:02 EDT   Assessment   PT Impairments or Limitations :   Abnormal tone, Balance deficits, Bed mobility deficits, Coordination/Proprioception deficits, Endurance deficits, Equipment training, Impaired sensation, Pain limiting function, Range of motion deficits, Strength deficits, Transfer deficits, Transition deficits, Wheelchair mobility deficits   Barriers to Safe Discharge PT :   Medical diagnosis   Discharge Recommendations :   ELOS: 3 weeks; home with assist and PWC     PT Treatment Recommendations :   pt with good effot c all act.  pt however cont to fatigue quickly and require max asist for all mob. pt will cont to benfit from skilled pt to maxmize strength and enduraen and to maxmize ed for both pt and spouse for safe handling and ed.      GATES, PTA, LEANNE M - 04/18/2016 12:56 EDT   Time Spent With Patient   PT Time In :   10:00 EST   PT Time Out :   11:30 EST   PT ADL TRAINING 15 MN :   6 units   PT ADL Training Time :   90 minutes   PT Total Individual Therapy Time :   90 minutes   PT Total Timed Code Treatment Units :   6 units   PT Total Timed Code Tx Minutes :   90 minutes   PT Total Treatment Time Rehab :   90 minutes   GATES, PTA, DAMIEN HERO - 04/18/2016 12:56 EDT

## 2016-04-19 NOTE — Progress Notes (Signed)
PT Inpatient Daily Documentation - Text       PT Inpatient Daily Documentation Entered On:  04/19/2016 12:00 EDT    Performed On:  04/19/2016 8:30 EDT by Eliseo Gum, PT, AMBER R               Reason for Treatment   *Reason for Referral :   Progressive MS (dx 1989)  new weakness following UTI and C-diff    3 hrs/day     *Chief Complaint :   weakness bilat LE's, tightness R ankle/calf     BENTON, PT, AMBER R - 04/19/2016 11:54 EDT   Review/Treatments Provided   PT Goals :   PT Short Term Goals    04/18/2016  Bed Mobility Goal #1: Roll to right and left; Mod A; Progressing, continue  Transfer Goal #1: Transfer board; Mod A; Progressing, continue  Balance Goal #1: Unsupported short sit; Mod A; Static sitting; 2; Improve independence with activities of daily living; Progressing, continue     PT Plan :   Treatment Frequency:  Daily (modified)   Performed By: Eliseo Gum, PT, AMBER R  03/30/2016 09:30  Treatment Duration: 3 Performed By: Carney Living, AMBER R  03/30/2016 09:30  Planned Treatments: Balance training, Basic activities of daily living, Bed mobility training, Caregiver training, Electrical stimulation, Equipment training, Functional training, Manual therapy, Neuromuscular reeducation, Pain management, Posture/Body mechanics training,... Performed By: Carney Living, AMBER R  03/30/2016 09:30     Short Term Goals Reviewed :   Yes   Physical Therapy Orders :   Physical Therapy Medical Hold - 04/18/16 12:00:00 EDT, Stop date 04/18/16 12:00:00 EDT, for appt. at urologist's office this afternoon  Physical Therapy Inpatient Additional Treatment Rehab - 03/30/16 11:39:51 EDT, Balance training, Basic activities of daily living, Bed mobility training, Caregiver training, Electrical stimulation, Equipment training, Functional training, Manual therapy, Neuromuscular reeducation, Pain management, Posture/...  PT FIMS - 03/29/16 19:38:00 EDT, Daily     Pain Present :   No actual or suspected pain   PT Therapeutic Activity,Mobility,Balance  :   Yes   PT Therapeutic Exercise :   Yes   BENTON, PT, AMBER R - 04/19/2016 11:54 EDT   Therapeutic Activities/Mobility/Balance   Functional Activity :   Therapeutic Activities  Activity 1:  Transfer training, Other: toilet chiar; Sliding board, Other: shower chiar; Demonstrated positive response to treatment, Tolerated well; pt c max assist of one, however due to clothes mgt pt total assist for toilet tx on shower chiar. pt c mod vc for leaning R on wc and L bed to assist c clothes mgt, and to assist c safe pt posistioning.       Performed Date:  04/18/2016  Activity 2:  Balance; Mod A; Required rest breaks, Tolerated well; while on shower chiar. pt working on [employer] trunk up right posistiong to alow doffing of pants and shirt and donning of dress for procedure being perfomed latter in day. pt c some assist comming up to sit after side bending once fatigued.       Performed Date:  04/18/2016  Activity 3:  Dynamic standing balance; Other: balloone; Decreased endurance, Increased activation of targeted muscle group(s), Required rest breaks, Tolerated well; pt sitting in PWC c back rest reclined away to dec pt pt support, pt alos c arm rest removed on R side 2'to being placed along mat. pt worke don ball act, c vc for bue use. pt able to catch bal at center and near center of body. pt  unable to perfomred       Performed Date:  04/18/2016  Activity 4:  catches off center due to due due ue rom, poor righting reactions due to dec trunk strenth. pt working on act to Social worker, strength and sitting bal       Performed Date:  04/18/2016     Eliseo Gum, PT, AMBER R - 04/19/2016 11:54 EDT   PT Therapeutic Activities Grid     Activity 1  Activity 2        Activity :    Transfer training   Transitional movement           Assist :    Moderate assistance   Maximal assistance           Position :       Prone on elbows           Response :    Improved stability in muscle group(s), Improved timing, control, and  coordination, Increased activation of targeted muscle group(s)   Demonstrated positive response to treatment, Improved stability in muscle group(s), Improved timing, control, and coordination, Increased activation of targeted muscle group(s)           Comment :    SB t/f with mod A in full trunk flex position. Assist for hand placement and lateral hip movement. Maxislide under pt for decr friction. Sit to supine with mod A for B LE assist.    transistion from L sidelying to prone on pillow then prone on elbows with max A for unweighting of chest and UE placement. Once in prone on elbows pt able to sustain with SPV x 30-60 sec.              BENTON, PT, AMBER R - 04/19/2016 11:54 EDT  BENTON, PT, AMBER R - 04/19/2016 11:54 EDT        Reassess Mobility :   Yes   BENTON, PT, AMBER R - 04/19/2016 11:54 EDT   PT Mobility   Mobility Grid   Roll Left :   Rehab Moderate assistance   Roll Prone :   Rehab Maximal assistance   Sit to Supine :   Rehab Moderate assistance   Transfer Bed to and From Chair :   Rehab Moderate assistance   BENTON, PT, AMBER R - 04/19/2016 11:54 EDT   Functional Mobility Details :   .   BENTON, PT, AMBER R - 04/19/2016 11:54 EDT   Amb Ability Varied Surf/Distraction Grid   Level Surfaces :   Does not occur   Uneven Surfaces :   Does not occur   Distracting Environments :   Does not occur   Curbs :   Does not occur   Stairs :   Does not occur   Ramp :   Does not occur   BENTON, PT, AMBER R - 04/19/2016 11:54 EDT   Transfer Type :   Transfer board   PT Mobility Reviewed :   Yes   BENTON, PT, AMBER R - 04/19/2016 11:54 EDT   Therapeutic Exercise   Therapeutic Exercise RTF :   Therapeutic Exercise  Exercise 1:  sitting LLe ex c aarom hip fex, hip ext ankle act , and saq c arom and greaded limited manual resistance as tol to asist c inc strength pt c arom  x2 reps for jt mobilty to RLE       Performed Date:  04/16/2016  Exercise 2:  shld retraction x 10.  APT  strecth x 5 c 30 sec hold and lat trunk stretch  to L x 5. pt performed all ex to assist c trunk and ue strength and rom to assist c cont progression and tol for fxn skilled act and to assist c progression of controled mobility       Performed Date:  04/11/2016  Exercise 3:  tilted back in power WC and reaching forward and grabing the therapists hand 2 sets of 5 reps c the R UE, 10 reps c the L UE active assisted; and then reaching c the L UE across the body 10 reps working on strengthening, coordination, and endurance.       Performed Date:  04/10/2016  Exercise 4:  sitting ankle pumps L LE for increasing ROM, strength, and endurance.       Performed Date:  04/10/2016     Eliseo GumBENTON, PT, AMBER R - 04/19/2016 11:54 EDT   Therapeutic Exercise Grid     Exercise 1          Exercise :    Scapular strengthening              Position :    Prone on elbows              Repetition/Time :    5 x 10              Comment :    scap retraction, cervical extension and serratus lift while prone on elbows for improved stength with functional mobility and transfers. Rest breaks between each exercise and set due to decr endurance.                 BENTON, PT, AMBER R - 04/19/2016 11:54 EDT         Short Term Goals   Bed Mobility Goal Grid     Goal #1          Descriptors :    Roll to right and left              Level :    Moderate assistance              Status :    Progressing, continue                BENTON, PT, AMBER R - 04/19/2016 11:54 EDT         Transfers Goal Grid     Goal #1          Descriptors :    Transfer board              Level :    Moderate assistance              Status :    Progressing, continue                BENTON, PT, AMBER R - 04/19/2016 11:54 EDT         W/C Management Grid     Goal #1  Goal #2  Goal #3  Goal #4    Descriptors :    Power wheelchair mobility indoors   Pressure relief tilt in space (power wheelchair)   Power wheelchair mobility indoors   Pressure relief tilt in space (power wheelchair)     Level :    Minimal assistance   Minimal assistance   Setup,  Distant supervision   Modified independence     Status :    Goal met   Goal  met   Goal met   Goal met     Date Met :    04/06/2016 EDT   04/06/2016 EDT   04/14/2016 EDT   04/14/2016 EDT       Eliseo Gum, PT, AMBER R - 04/19/2016 11:54 EDT  BENTON, PT, AMBER R - 04/19/2016 11:54 EDT  BENTON, PT, AMBER R - 04/19/2016 11:54 EDT  BENTON, PT, AMBER R - 04/19/2016 11:54 EDT      PT Balance Goal Grid     Goal #1          Descriptor :    Unsupported short sit              Assist Level :    Moderate assistance              Type :    Static sitting              Length of Time (minutes) :    2 minutes              Rationale :    Improve independence with activities of daily living              Status :    Progressing, continue                BENTON, PT, AMBER R - 04/19/2016 11:54 EDT         PT ST Goals Reviewed :   Yes   BENTON, PT, AMBER R - 04/19/2016 11:54 EDT   Assessment   PT Impairments or Limitations :   Abnormal tone, Balance deficits, Bed mobility deficits, Coordination/Proprioception deficits, Endurance deficits, Equipment training, Impaired sensation, Pain limiting function, Range of motion deficits, Strength deficits, Transfer deficits, Transition deficits, Wheelchair mobility deficits   Barriers to Safe Discharge PT :   Medical diagnosis   Discharge Recommendations :   ELOS: 3 weeks; home with assist and PWC     PT Treatment Recommendations :   Pt demos improved strength in prone position and ability to assist with SB transfers in full trunk flexion today. She will cont to benefit from skilled PT to address above impairments and progress indep with functional mobiltiy and transfers prior to DC home.      BENTON, PT, AMBER R - 04/19/2016 11:54 EDT   Time Spent With Patient   PT Time In :   8:30 EST   PT Time Out :   9:30 EST   PT Therapeutic Exercise Units :   2 units   PT Therapeutic Exercise Time :   30 minutes   PT ADL TRAINING 15 MN :   1 units   PT ADL Training Time :   15 minutes   PT NEUROMUSC RE-EDUC 15 MIN :   1  units   PT Neuromuscular Reeducation Time :   15 minutes   PT Total Individual Therapy Time :   60 minutes   PT Total Timed Code Treatment Units :   4 units   PT Total Timed Code Tx Minutes :   60 minutes   PT Total Treatment Time Rehab :   60 minutes   BENTON, PT, AMBER R - 04/19/2016 11:54 EDT   Additional Information   Additional Information PT :   Prone lying on prone pillow x 30 min outside of PT tx time for improved anterior ROM.      BENTON, PT, AMBER R - 04/19/2016 11:54 EDT

## 2016-04-19 NOTE — Progress Notes (Signed)
Functional Indep Measure Scores - Text       Functional Independence Measure Scores Entered On:  04/19/2016 16:59 EDT    Performed On:  04/19/2016 16:58 EDT by Katrinka BlazingSMITH, RN, KRISTINA K               Eating Score   Patient's Independence Level With Eating Tasks :   Setup/Supervision   Supervision or Setup Needed :   Open containers, Prepare food for eating   Functional Independence Measure Eating :   Supervision or setup   Viera EastSMITH, RN, Marzella SchleinKRISTINA K - 04/19/2016 16:58 EDT   Toileting Score   Patient's Independence Level with Toileting Tasks :   Assistance   Type of Assistance Necessary :   Assistance with toileting tasks   Amount of Assistance Needed :   Adjusting clothing before toileting, Adjusting clothing after toileting, Cleansing perineal area   Toileting :   Total assistance   [place], RN, Marzella SchleinKRISTINA K - 04/19/2016 16:58 EDT   Bladder Management Score   Patient's Independence Level with Bladder Management Tasks :   Assistance   Type of Assistance Necessary :   Helper positions and holds bedpan, assists patient to roll on AND off the bedpan, Helper changes diaper or absorbent pad   Functional Independence Measure Bladder Management :   Total assistance   [place], RN, Marzella SchleinKRISTINA K - 04/19/2016 16:58 EDT   Bowel Management Score   Patient's Independence Level with Bowel Management Tasks :   Assistance   Type of Assistance Necessary :   Helper positions and holds bedpan, assists patient to roll on AND off the bedpan   Functional Independence Measure Bowel Management :   Total assistance   [place], RN, Marzella SchleinKRISTINA K - 04/19/2016 16:58 EDT   Transfer Bed/Chair/WC Score   Patient's independence Level with Bed, Chair, Wheelchair Tasks :   Assistance   Type of Assistance Necessary :   Lifting two legs, Lifting and lowering patient, Two helpers needed, Total assist (patient performs less than 25%)   Bed, Chair, Wheelchair Transfer :   Total assistance   Cheral MarkerSMITH, RN, KRISTINA K - 04/19/2016 16:58 EDT

## 2016-04-19 NOTE — Progress Notes (Signed)
OT Inpatient Daily Documentation - Text       OT Inpatient Daily Documentation Entered On:  04/19/2016 12:31 EDT    Performed On:  04/19/2016 10:00 EDT by WATERS, OT, MANDI R               Reason for Treatment   *Reason for Referral :   Admitted for MS exacerbation    82 yo female with past medical history of progressive MS diagnosed in 1989, CVA with right sided weakness, hypothyroidism and neurogenic bladder [facility] [place] with recurrent generalized weakness of the upper and lower extremities and poor posture. She has been wheelchair bound for the past few years due to right-sided weakness.    (per pt, she did not have a CVA)     *Chief Complaint :   weakness     WATERS, OT, MANDI R - 04/19/2016 12:12 EDT   Review/Treatments Provided   OT Goals :   OT Short Term Goals    04/14/2016  Eating Goal #1: Manage food containers/packages; Distant S; Progressing, continue  Upper Body Dressing Goal #2: Upper extremity dressing; Minimal assistance; Initial  Lower Body Dressing Goal #1: Lower extremity dressing; Supine, Supported short sit, Unsupported short sit; Max A; Progressing, continue  Bathing Goal #2: Bathe; Mod A; Initial  Toileting and Transfers Goal #1: Toilet transfers; Max A; Bedside commode; Drop arm commode, Elevated toilet seat, Grab bars, Sliding board; Progressing, continue  Toileting and Transfers Goal #2: Toileting; Max A; Drop arm commode, Elevated toilet seat, Grab bars, Sliding board; Progressing, continue  Balance Goal #1: Unsupported short sit; Static sitting; CGA; 5; Improve independence with activities of daily living; Initial     OT Plan :   Treatment Frequency: Daily Performed By: Carilyn GoodpastureHOBBY, OT, LESLIE M   03/30/2016  Treatment Duration: 3 Performed By: Katherine RoanHOBBY, OT, LESLIE M   03/30/2016  Planned Treatments: Balance training, Basic Activities of Daily Living, Caregiver training, Coordination, Energy conservation training, Equipment training, Group therapy,  Mobility training, Patient education, Safety education, Therapeutic activities, Therapeutic exercises... Performed By: Katherine RoanHOBBY, OT, LESLIE M   03/30/2016     Occupational Therapy Orders :   Occupational Therapy Medical Hold - 04/18/16 12:00:00 EDT, Stop date 04/18/16 12:00:00 EDT, for appointment at urologist's office this afternoon  Occupational Therapy Inpatient Additional Treatment Rehab - 03/30/16 16:40:41 EDT, Balance training, Basic Activities of Daily Living, Caregiver training, Coordination, Energy conservation training, Equipment training, Group therapy, Mobility training, Patient education, Safety education, Therapeutic activitie...  OT FIMS - 03/29/16 19:38:00 EDT, Daily     Pain Present :   Yes actual or suspected pain   OT Therapeutic Activity,Mobility,Balance :   Yes   WATERS, OT, MANDI R - 04/19/2016 12:12 EDT   Pain Assessment   Pain Location :   Shoulder   Laterality :   Left   Self Report Pain :   FACES pain scale   WATERS, OT, MANDI R - 04/19/2016 12:12 EDT   Therapeutic Activities   OT Therapeutic Activities RTF :   Therapeutic Activities  Activity 1:  While seated EOM, pt completed upright sitting with mod A. E-stim placed to B erectors, R obliques, and R tricep to increase upright posture in static sitting. Pt completed 3x10 reps of contract/release to increase strength. (modified)       Performed Date:  04/17/2016  Activity 2:  Reaching anteriorly, Other: core strengthening/dynamic sitting balance; Mod A; Sitting EOM, pt anteriorly reached to touch  ball to ground using BUEs for 5 reps x 2 sets with Min A for control for trunk flexion and Mod A for trunk extension to come to upright sitting posture.  Task to incr core strength/dynamic sitting balance       Performed Date:  04/10/2016  Activity 3:  Transitional movement; Mod A; Sitting in wheelchair, Supported short sit; Improved stability in muscle group(s), Improved timing, control, and coordination, Increased activation of targeted muscle  group(s), Integrated muscular activation into functional activity; pt performed lat scoot transfer with gait belt around hips and sliding board from w/c <> EOM x2.  Pt's husband performed trasnfers 1x with CGA from OT after ed.       Performed Date:  04/09/2016  Activity 4:  Other: Dynamic sitting balance; Minimal assistance; Supported short sit, Unsupported short sit; Other: Ball/bean bags; Improved stability in muscle group(s), Improved timing, control, and coordination, Increased activation of targeted muscle group(s), Integrated muscular activation into functional activity; Pt performed anterior reaching to place bean bags into container on table top with occassional assist to maintain balance  when reachign with RUE.  Task to increase dynamic sitting balance needed for greater I with transfers and ADL performance.       Performed Date:  04/09/2016  Activity 5:  Improved stability in muscle group(s), Improved timing, control, and coordination, Increased activation of targeted muscle group(s), Integrated muscular activation into functional activity       Performed Date:  04/09/2016     WATERS, OT, MANDI R - 04/19/2016 12:12 EDT   OT Therapeutic Activities Grid     Activity 1          Comments :    Pt completed rolling on mat, supine to sit on mat with total A. Pt completed elbow prop to up hand prop with total A but pt did demonstrate trace muscle activation in B UE's. Pt sat EOM with distance S. Pt transferred EOM>w/c with S.                 WATERS, OT, MANDI R - 04/19/2016 12:12 EDT         Assessment   OT Impairments or Limitations :   Balance deficits, Basic activity of daily living deficits, Coordination deficits, Endurance deficits, Equipment training, IADL deficits, Mobility deficits, Proprioception deficits, Safety awareness deficits, Strength deficits   Barriers to Safe Discharge OT :   Complicated medical history, Decreased communication, Limited family support, Limited social support, Medical  diagnosis, Progressive nature of disease, Safety awareness, Severity of deficits   OT Discharge Recommendations :   ELOS 3 weeks then home with spouse and power w/c     OT Treatment Recommendations :   Pt continues to req skilled OT services to increase strength, transfers, ADL's, mobility, and endurance.      WATERS, OT, MANDI R - 04/19/2016 12:12 EDT   Time Spent With Patient   OT Time In :   10:00 EST   OT Time Out :   11:00 EST   OT Therapeutic Exercise Units :   4 units   OT Therapeutic Exercise Time :   60 minutes   OT Total Individual Therapy Time :   60 minutes   OT Total Timed Code Treatment Units :   4 units   OT Total Timed Code Treatment Minutes :   60 minutes   OT Total Treatment Time Rehab :   60 minutes   WATERS, OT, MANDI R - 04/19/2016 12:12 EDT

## 2016-04-19 NOTE — Progress Notes (Signed)
OT Inpatient Daily Documentation - Text       OT Inpatient Daily Documentation Entered On:  04/19/2016 15:28 EDT    Performed On:  04/19/2016 15:23 EDT by Christena Deem               Reason for Treatment   *Reason for Referral :   Admitted for MS exacerbation    82 yo female with past medical history of progressive MS diagnosed in 1989, CVA with right sided weakness, hypothyroidism and neurogenic bladder admitted to [facility] in [place], [place] on 03/06/16 with recurrent generalized weakness of the upper and lower extremities and poor posture. She has been wheelchair bound for the past few years due to right-sided weakness.    (per pt, she did not have a CVA)     *Chief Complaint :   weakness     Christena Deem - 04/19/2016 15:23 EDT   Review/Treatments Provided   OT Goals :   OT Short Term Goals    04/14/2016  Eating Goal #1: Manage food containers/packages; Distant S; Progressing, continue  Upper Body Dressing Goal #2: Upper extremity dressing; Minimal assistance; Initial  Lower Body Dressing Goal #1: Lower extremity dressing; Supine, Supported short sit, Unsupported short sit; Max A; Progressing, continue  Bathing Goal #2: Bathe; Mod A; Initial  Toileting and Transfers Goal #1: Toilet transfers; Max A; Bedside commode; Drop arm commode, Elevated toilet seat, Grab bars, Sliding board; Progressing, continue  Toileting and Transfers Goal #2: Toileting; Max A; Drop arm commode, Elevated toilet seat, Grab bars, Sliding board; Progressing, continue  Balance Goal #1: Unsupported short sit; Static sitting; CGA; 5; Improve independence with activities of daily living; Initial     OT Plan :   Treatment Frequency: Daily Performed By: Carilyn Goodpasture M   03/30/2016  Treatment Duration: 3 Performed By: Katherine Roan   03/30/2016  Planned Treatments: Balance training, Basic Activities of Daily Living, Caregiver training, Coordination, Energy conservation training, Equipment training, Group therapy, Mobility training,  Patient education, Safety education, Therapeutic activities, Therapeutic exercises... Performed By: Katherine Roan   03/30/2016     Occupational Therapy Orders :   Occupational Therapy Medical Hold - 04/18/16 12:00:00 EDT, Stop date 04/18/16 12:00:00 EDT, for appointment at urologist's office this afternoon  Occupational Therapy Inpatient Additional Treatment Rehab - 03/30/16 16:40:41 EDT, Balance training, Basic Activities of Daily Living, Caregiver training, Coordination, Energy conservation training, Equipment training, Group therapy, Mobility training, Patient education, Safety education, Therapeutic activitie...  OT FIMS - 03/29/16 19:38:00 EDT, Daily     Pain Present :   Yes actual or suspected pain   OT Therapeutic Activity,Mobility,Balance :   Yes   Christena Deem - 04/19/2016 15:23 EDT   Pain Assessment   Pain Location :   Shoulder   Laterality :   Left   Self Report Pain :   FACES pain scale   Christena Deem - 04/19/2016 15:23 EDT   Therapeutic Activities   OT Therapeutic Activities RTF :   Therapeutic Activities  Activity 1:  Pt completed rolling on mat, supine to sit on mat with total A. Pt completed elbow prop to up hand prop with total A but pt did demonstrate trace muscle activation in B UE's. Pt sat EOM with distance S. Pt transferred EOM>w/c with S.       Performed Date:  04/19/2016  Activity 2:  Reaching anteriorly, Other: core strengthening/dynamic sitting balance; Mod A; Sitting EOM, pt  anteriorly reached to touch ball to ground using BUEs for 5 reps x 2 sets with Min A for control for trunk flexion and Mod A for trunk extension to come to upright sitting posture.  Task to incr core strength/dynamic sitting balance       Performed Date:  04/10/2016  Activity 3:  Transitional movement; Mod A; Sitting in wheelchair, Supported short sit; Improved stability in muscle group(s), Improved timing, control, and coordination, Increased activation of targeted muscle group(s), Integrated muscular activation  into functional activity; pt performed lat scoot transfer with gait belt around hips and sliding board from w/c <> EOM x2.  Pt's husband performed trasnfers 1x with CGA from OT after ed.       Performed Date:  04/09/2016  Activity 4:  Other: Dynamic sitting balance; Minimal assistance; Supported short sit, Unsupported short sit; Other: Ball/bean bags; Improved stability in muscle group(s), Improved timing, control, and coordination, Increased activation of targeted muscle group(s), Integrated muscular activation into functional activity; Pt performed anterior reaching to place bean bags into container on table top with occassional assist to maintain balance  when reachign with RUE.  Task to increase dynamic sitting balance needed for greater I with transfers and ADL performance.       Performed Date:  04/09/2016  Activity 5:  Improved stability in muscle group(s), Improved timing, control, and coordination, Increased activation of targeted muscle group(s), Integrated muscular activation into functional activity       Performed Date:  04/09/2016     Christena Deem - 04/19/2016 15:23 EDT   OT Therapeutic Activities Grid     Activity 1          Activities :    Other: FES Bike              Comments :    Pt tolerated well. Electrodes placed on  B erectors, R obliques, R triceps, R wrist flexors and R wrist extensors. 30 min total with intervals of increased power/acceleration, up to power level 1.5 to 4.0 maintained for 30 seconds.                Christena Deem - 04/19/2016 15:23 EDT         Assessment   OT Impairments or Limitations :   Balance deficits, Basic activity of daily living deficits, Coordination deficits, Endurance deficits, Equipment training, IADL deficits, Mobility deficits, Proprioception deficits, Safety awareness deficits, Strength deficits   Barriers to Safe Discharge OT :   Complicated medical history, Decreased communication, Limited family support, Limited social support, Medical diagnosis, Progressive  nature of disease, Safety awareness, Severity of deficits   OT Discharge Recommendations :   ELOS 3 weeks then home with spouse and power w/c     OT Treatment Recommendations :   Pt tolerated FES bike well. Pt limited by rotator cuff pain in L shoulder.      Christena Deem - 04/19/2016 15:23 EDT   Time Spent With Patient   OT Time In :   14:00 EST   OT Time Out :   15:00 EST   OT Functional Activities Minutes :   60 minutes   OT FUNCTIONAL TRNG 15 MIN :   4    OT Total Individual Therapy Time :   60 minutes   OT Total Timed Code Treatment Units :   4 units   OT Total Timed Code Treatment Minutes :   60 minutes   OT Total Treatment Time Rehab :  60 minutes   Christena Deem - 04/19/2016 15:23 EDT

## 2016-04-19 NOTE — Progress Notes (Signed)
Functional Indep Measure Scores - Text       Functional Independence Measure Scores Entered On:  04/19/2016 12:10 EDT    Performed On:  04/19/2016 10:00 EDT by WATERS, OT, MANDI R               Comprehension Score   Mode of Comprehension :   Auditory   Comprehends Complex or Abstract Information Without Prompting or Cueing :   Yes   Understands Complex or Abstract Directions and Conversations :   At all times   Comprehension Indep Measure Interim :   Complete independence   WATERS, OT, MANDI R - 04/19/2016 12:09 EDT   Expression Score   Expression Mode :   Vocal   Expresses Complex or Abstract Information Without Prompting or Cueing :   Yes   Expresses Complex or Abstract Ideas :   Clearly and fluently at all times   Expression Indep Measure Interim :   Complete independence   WATERS, OT, MANDI R - 04/19/2016 12:09 EDT   Social Interaction Score   Interacts Appropriately Without Supervision :   Yes   Interacts Appropriately :   At all times   Social Interaction Indep Measure Interim :   Complete independence   WATERS, OT, MANDI R - 04/19/2016 12:09 EDT   Problem Solving Score   Solves Complex Problems :   Yes   Ability to Solve Complex Problems :   Consistently solves problems independently   Problem Solving Indep Measure Interim :   Complete independence   WATERS, OT, MANDI R - 04/19/2016 12:09 EDT   Memory Score   Recognizes, Remembers Routines, and Executes Requests Without Prompting :   Yes   Remembers and Executes Requests :   Consistently without need for repetition   Memory Indep Measure Interim :   Complete independence   WATERS, OT, MANDI R - 04/19/2016 12:09 EDT

## 2016-04-20 NOTE — Progress Notes (Signed)
Functional Indep Measure Scores - Text       Functional Independence Measure Scores Entered On:  04/20/2016 15:33 EDT    Performed On:  04/20/2016 15:32 EDT by Eliseo GumBENTON, OT, ANN K               Comprehension Score   Mode of Comprehension :   Auditory   Comprehends Complex or Abstract Information Without Prompting or Cueing :   Yes   Understands Complex or Abstract Directions and Conversations :   At all times   Comprehension Indep Measure Interim :   Complete independence   BENTON, OT, ANN K - 04/20/2016 15:32 EDT   Expression Score   Expression Mode :   Vocal   Expresses Complex or Abstract Information Without Prompting or Cueing :   Yes   Expresses Complex or Abstract Ideas :   Clearly and fluently at all times   Expression Indep Measure Interim :   Complete independence   BENTON, OT, ANN K - 04/20/2016 15:32 EDT   Social Interaction Score   Interacts Appropriately Without Supervision :   Yes   Interacts Appropriately :   At all times   Social Interaction Indep Measure Interim :   Complete independence   BENTON, OT, ANN K - 04/20/2016 15:32 EDT   Problem Solving Score   Solves Complex Problems :   Yes   Ability to Solve Complex Problems :   Consistently solves problems independently   Problem Solving Indep Measure Interim :   Complete independence   BENTON, OT, ANN K - 04/20/2016 15:32 EDT   Memory Score   Recognizes, Remembers Routines, and Executes Requests Without Prompting :   Yes   Remembers and Executes Requests :   Consistently without need for repetition   Memory Indep Measure Interim :   Complete independence   BENTON, OT, ANN K - 04/20/2016 15:32 EDT

## 2016-04-20 NOTE — Nursing Note (Signed)
Medication Administration Follow Up-Text       Medication Administration Follow Up Entered On:  04/20/2016 0:23 EDT    Performed On:  04/20/2016 0:23 EDT by Marguerita MerlesLemacks,  Lisa-RN      Intervention Information:     lorazepam  Performed by Marguerita MerlesLemacks,  Lisa-RN on 04/19/2016 21:51:00 EDT       lorazepam,1mg   Oral,insomnia       Medication Effectiveness Evaluation   Medication Administration Reason :   Anxiety   Medication Effective :   Yes   Medication Response :   Symptoms improved   Lemacks,  Lisa-RN - 04/20/2016 0:23 EDT

## 2016-04-20 NOTE — Nursing Note (Signed)
Medication Administration Follow Up-Text       Medication Administration Follow Up Entered On:  04/20/2016 0:24 EDT    Performed On:  04/20/2016 0:23 EDT by Marguerita MerlesLemacks,  Lisa-RN      Intervention Information:     tramadol  Performed by Marguerita MerlesLemacks,  Lisa-RN on 04/19/2016 21:51:00 EDT       tramadol,25mg   Oral,mild pain (1-3) or temp > 100.5 F       Medication Effectiveness Evaluation   Medication Administration Reason :   Pain   Medication Effective :   Yes   Medication Response :   Symptoms improved   Lemacks,  Lisa-RN - 04/20/2016 0:23 EDT

## 2016-04-20 NOTE — Progress Notes (Signed)
PT Inpatient Daily Documentation - Text       PT Inpatient Daily Documentation Entered On:  04/20/2016 12:11 EDT    Performed On:  04/20/2016 8:30 EDT by Eliseo Gum, PT, AMBER R               Reason for Treatment   *Reason for Referral :   Progressive MS (dx 1989)  new weakness following UTI and C-diff    3 hrs/day     *Chief Complaint :   weakness bilat LE's, tightness R ankle/calf     BENTON, PT, AMBER R - 04/20/2016 12:09 EDT   Review/Treatments Provided   PT Goals :   PT Short Term Goals    04/19/2016  Bed Mobility Goal #1: Roll to right and left; Mod A; Progressing, continue  Transfer Goal #1: Transfer board; Mod A; Progressing, continue  Balance Goal #1: Unsupported short sit; Mod A; Static sitting; 2; Improve independence with activities of daily living; Progressing, continue     PT Plan :   Treatment Frequency:  Daily (modified)   Performed By: Eliseo Gum, PT, AMBER R  03/30/2016 09:30  Treatment Duration: 3 Performed By: Carney Living, AMBER R  03/30/2016 09:30  Planned Treatments: Balance training, Basic activities of daily living, Bed mobility training, Caregiver training, Electrical stimulation, Equipment training, Functional training, Manual therapy, Neuromuscular reeducation, Pain management, Posture/Body mechanics training,... Performed By: Carney Living, AMBER R  03/30/2016 09:30     Short Term Goals Reviewed :   Yes   Physical Therapy Orders :   Physical Therapy Medical Hold - 04/18/16 12:00:00 EDT, Stop date 04/18/16 12:00:00 EDT, for appt. at urologist's office this afternoon  Physical Therapy Inpatient Additional Treatment Rehab - 03/30/16 11:39:51 EDT, Balance training, Basic activities of daily living, Bed mobility training, Caregiver training, Electrical stimulation, Equipment training, Functional training, Manual therapy, Neuromuscular reeducation, Pain management, Posture/...  PT FIMS - 03/29/16 19:38:00 EDT, Daily     Pain Present :   No actual or suspected pain   PT Therapeutic Exercise :   Yes    BENTON, PT, AMBER R - 04/20/2016 12:09 EDT   Therapeutic Exercise   Therapeutic Exercise RTF :   Therapeutic Exercise  Exercise 1:  Scapular strengthening; Prone on elbows; 5 x 10; scap retraction, cervical extension and serratus lift while prone on elbows for improved stength with functional mobility and transfers. Rest breaks between each exercise and set due to decr endurance.       Performed Date:  04/19/2016  Exercise 2:  shld retraction x 10.  APT strecth x 5 c 30 sec hold and lat trunk stretch to L x 5. pt performed all ex to assist c trunk and ue strength and rom to assist c cont progression and tol for fxn skilled act and to assist c progression of controled mobility       Performed Date:  04/11/2016  Exercise 3:  tilted back in power WC and reaching forward and grabing the therapists hand 2 sets of 5 reps c the R UE, 10 reps c the L UE active assisted; and then reaching c the L UE across the body 10 reps working on strengthening, coordination, and endurance.       Performed Date:  04/10/2016  Exercise 4:  sitting ankle pumps L LE for increasing ROM, strength, and endurance.       Performed Date:  04/10/2016     Carney Living, AMBER R - 04/20/2016 12:09 EDT   Therapeutic Exercise Grid  Exercise 1          Exercise :    Scapular strengthening              Position :    Supported sit              Repetition/Time :    3 x 10              Comment :    scap retraction, depression, upper trap stretch to increase strength and ROM with functional mobility and transfers.                 BENTON, PT, AMBER R - 04/20/2016 12:09 EDT         Short Term Goals   Bed Mobility Goal Grid     Goal #1          Descriptors :    Roll to right and left              Level :    Moderate assistance              Status :    Progressing, continue                BENTON, PT, AMBER R - 04/20/2016 12:09 EDT         Transfers Goal Grid     Goal #1          Descriptors :    Transfer board              Level :    Moderate assistance               Status :    Progressing, continue                BENTON, PT, AMBER R - 04/20/2016 12:09 EDT         W/C Management Grid     Goal #1  Goal #2  Goal #3  Goal #4    Descriptors :    Power wheelchair mobility indoors   Pressure relief tilt in space (power wheelchair)   Power wheelchair mobility indoors   Pressure relief tilt in space (power wheelchair)     Level :    Minimal assistance   Minimal assistance   Setup, Distant supervision   Modified independence     Status :    Goal met   Goal met   Goal met   Goal met     Date Met :    04/06/2016 EDT   04/06/2016 EDT   04/14/2016 EDT   04/14/2016 EDT       BENTON, PT, AMBER R - 04/20/2016 12:09 EDT  BENTON, PT, AMBER R - 04/20/2016 12:09 EDT  BENTON, PT, AMBER R - 04/20/2016 12:09 EDT  BENTON, PT, AMBER R - 04/20/2016 12:09 EDT      PT Balance Goal Grid     Goal #1          Descriptor :    Unsupported short sit              Assist Level :    Moderate assistance              Type :    Static sitting              Length of Time (minutes) :    2 minutes  Rationale :    Improve independence with activities of daily living              Status :    Progressing, continue                BENTON, PT, AMBER R - 04/20/2016 12:09 EDT         PT ST Goals Reviewed :   Silvio ClaymanYes   BENTON, PT, AMBER R - 04/20/2016 12:09 EDT   Education   Home Caregiver Name/Relationship :   Tonye PearsonAlfred-spouse   BENTON, PT, AMBER R - 04/20/2016 12:09 EDT   Physical Therapy Education Grid   Bed to Chair Transfers :   Trenton GammonVerbalizes understanding, Demonstrates, Needs further teaching, Needs practice/supervision   Physical Therapy Plan of Care :   Verbalizes understanding, Demonstrates, Needs further teaching, Needs practice/supervision   BENTON, PT, AMBER R - 04/20/2016 12:09 EDT   PT Additional Education :   EDU on DC plan, assist needed at home and functional progress     BENTON, PT, AMBER R - 04/20/2016 12:09 EDT   Assessment   PT Impairments or Limitations :   Abnormal tone, Balance deficits, Bed mobility  deficits, Coordination/Proprioception deficits, Endurance deficits, Equipment training, Impaired sensation, Pain limiting function, Range of motion deficits, Strength deficits, Transfer deficits, Transition deficits, Wheelchair mobility deficits   Barriers to Safe Discharge PT :   Medical diagnosis   Discharge Recommendations :   ELOS: 3 weeks; home with assist and PWC     PT Treatment Recommendations :   Pt will cont to benefit from skilled PT to address above impairments and progress indep with functional mobility and transfers prior to DC     BENTON, PT, AMBER R - 04/20/2016 12:09 EDT   Time Spent With Patient   PT Time In :   8:30 EST   PT Time Out :   9:00 EST   PT Therapeutic Exercise Units :   2 units   PT Therapeutic Exercise Time :   30 minutes   PT Total Individual Therapy Time :   30 minutes   PT Total Timed Code Treatment Units :   2 units   PT Total Timed Code Tx Minutes :   30 minutes   PT Total Treatment Time Rehab :   30 minutes   BENTON, PT, AMBER R - 04/20/2016 12:09 EDT

## 2016-04-20 NOTE — Progress Notes (Signed)
OT Inpatient Daily Documentation - Text       OT Inpatient Daily Documentation Entered On:  04/20/2016 15:42 EDT    Performed On:  04/20/2016 15:33 EDT by Eliseo GumBENTON, OT, ANN K               Reason for Treatment   *Reason for Referral :   Admitted for MS exacerbation    82 yo female with past medical history of progressive MS diagnosed in 1989, CVA with right sided weakness, hypothyroidism and neurogenic bladder [facility] [place] on 03/06/16 with recurrent generalized weakness of the upper and lower extremities and poor posture. She has been wheelchair bound for the past few years due to right-sided weakness.    (per pt, she did not have a CVA)     *Chief Complaint :   weakness     BENTON, OT, ANN K - 04/20/2016 15:33 EDT   Review/Treatments Provided   OT Goals :   OT Short Term Goals    04/14/2016  Eating Goal #1: Manage food containers/packages; Distant S; Progressing, continue  Upper Body Dressing Goal #2: Upper extremity dressing; Minimal assistance; Initial  Lower Body Dressing Goal #1: Lower extremity dressing; Supine, Supported short sit, Unsupported short sit; Max A; Progressing, continue  Bathing Goal #2: Bathe; Mod A; Initial  Toileting and Transfers Goal #1: Toilet transfers; Max A; Bedside commode; Drop arm commode, Elevated toilet seat, Grab bars, Sliding board; Progressing, continue  Toileting and Transfers Goal #2: Toileting; Max A; Drop arm commode, Elevated toilet seat, Grab bars, Sliding board; Progressing, continue  Balance Goal #1: Unsupported short sit; Static sitting; CGA; 5; Improve independence with activities of daily living; Initial     OT Plan :   Treatment Frequency: Daily Performed By: Carilyn GoodpastureHOBBY, OT, LESLIE M   03/30/2016  Treatment Duration: 3 Performed By: Katherine RoanHOBBY, OT, LESLIE M   03/30/2016  Planned Treatments: Balance training, Basic Activities of Daily Living, Caregiver training, Coordination, Energy conservation training, Equipment training, Group therapy, Mobility  training, Patient education, Safety education, Therapeutic activities, Therapeutic exercises... Performed By: Katherine RoanHOBBY, OT, LESLIE M   03/30/2016     Occupational Therapy Orders :   Occupational Therapy Medical Hold - 04/18/16 12:00:00 EDT, Stop date 04/18/16 12:00:00 EDT, for appointment at urologist's office this afternoon  Occupational Therapy Inpatient Additional Treatment Rehab - 03/30/16 16:40:41 EDT, Balance training, Basic Activities of Daily Living, Caregiver training, Coordination, Energy conservation training, Equipment training, Group therapy, Mobility training, Patient education, Safety education, Therapeutic activitie...  OT FIMS - 03/29/16 19:38:00 EDT, Daily     Pain Present :   No actual or suspected pain   OT Therapeutic Exercise :   Yes   BENTON, OT, ANN K - 04/20/2016 15:33 EDT   Therapeutic Exercise   Therapeutic Exercise RTF :   Therapeutic Exercise  Exercise 1:  Pt completed inset peg board using tongs with L hand and no tongs with R hand and increased difficulty and time. Pt completed scap retrat therex picking up pegs and placing in bucket. Pt req max vc's to keep head in neutral position.       Performed Date:  04/18/2016  Exercise 2:  with L hand and increased time due to fatigue and decreased strength. Placed key shaped pegs in holes with increased difficulty and L hand.       Performed Date:  04/04/2016     Wyonia HoughBENTON, OT, ANN K - 04/20/2016 15:33 EDT   OT Therapeutic Exercise  Grid     Exercise 1  Exercise 2        Exercise :    Upper extremity active range   Upper extremity strengthening           Comments :    Active exercises reaching forward for both UE range and core balance, both hands together protracting and extending UEs at shoulder level.   Gentle resistive exercises for right hand for working on increasing extension.             BENTON, OT, ANN K - 04/20/2016 15:33 EDT  BENTON, OT, ANN K - 04/20/2016 15:33 EDT        Assessment   OT Impairments or Limitations :   Balance deficits,  Basic activity of daily living deficits, Coordination deficits, Endurance deficits, Equipment training, IADL deficits, Mobility deficits, Proprioception deficits, Safety awareness deficits, Strength deficits   Barriers to Safe Discharge OT :   Complicated medical history, Decreased communication, Limited family support, Limited social support, Medical diagnosis, Progressive nature of disease, Safety awareness, Severity of deficits   OT Discharge Recommendations :   ELOS 3 weeks then home with spouse and power w/c     OT Treatment Recommendations :   Tolerated session well. Right hand weak but able to use as gross stabilizer from pushing down but not grasp.      BENTON, OT, ANN K - 04/20/2016 15:43 EDT   Time Spent With Patient   OT Time In :   13:15 EST   OT Time Out :   13:30 EST   OT Therapeutic Exercise Units :   1 units   OT Therapeutic Exercise Time :   15 minutes   OT Total Individual Therapy Time :   15 minutes   OT Total Timed Code Treatment Units :   1 units   OT Total Timed Code Treatment Minutes :   15 minutes   OT Total Treatment Time Rehab :   15 minutes   BENTON, OT, ANN K - 04/20/2016 15:33 EDT   OT Units Cancelled Missed     OT Units Lost #1          Amount :    1               Reason :    Other: Started late and therapist had group time and unable to extend patient. Arranged with pt to see during lunch tomorrow for shower.                BENTON, OT, ANN K - 04/20/2016 15:33 EDT         OT Minutes Cancelled Missed Grid     OT Minutes Lost #1          Amount :    15               Reason :    Conflicting appointment  (Comment: Therapist started late and had group.  Patient didn't want to make up time.   Eliseo Gum, OT, ANN K - 04/20/2016 15:43 EDT] )               Eliseo Gum, OT, ANN K - 04/20/2016 15:33 EDT

## 2016-04-21 NOTE — Progress Notes (Signed)
PT Inpatient Daily Documentation - Text       PT Inpatient Daily Documentation Entered On:  04/21/2016 12:10 EDT    Performed On:  04/21/2016 9:00 EDT by Kevan Ny, PTA, Melynda Ripple               Reason for Treatment   Subjective Statement :   pt compeating ADL's c nsg , pt handed off to nsg for tioleting c TA  In to assist at end of sessoin     *Reason for Referral :   Progressive MS (dx 1989)  new weakness following UTI and C-diff    3 hrs/day     *Chief Complaint :   weakness bilat LE's, tightness R ankle/calf     GATES, PTA, Melynda Ripple - 04/21/2016 12:04 EDT   Review/Treatments Provided   PT Goals :   PT Short Term Goals    04/20/2016  Bed Mobility Goal #1: Roll to right and left; Mod A; Progressing, continue  Transfer Goal #1: Transfer board; Mod A; Progressing, continue  Balance Goal #1: Unsupported short sit; Mod A; Static sitting; 2; Improve independence with activities of daily living; Progressing, continue     PT Plan :   Treatment Frequency:  Daily (modified)   Performed By: Eliseo Gum, PT, AMBER R  03/30/2016 09:30  Treatment Duration: 3 Performed By: Carney Living, AMBER R  03/30/2016 09:30  Planned Treatments: Balance training, Basic activities of daily living, Bed mobility training, Caregiver training, Electrical stimulation, Equipment training, Functional training, Manual therapy, Neuromuscular reeducation, Pain management, Posture/Body mechanics training,... Performed By: Carney Living, AMBER R  03/30/2016 09:30     Short Term Goals Reviewed :   Yes   Physical Therapy Orders :   Physical Therapy Medical Hold - 04/18/16 12:00:00 EDT, Stop date 04/18/16 12:00:00 EDT, for appt. at urologist's office this afternoon  PT FIMS - 03/29/16 19:38:00 EDT, Daily     Pain Present :   No actual or suspected pain   PT Therapeutic Exercise :   Yes   Alger Memos - 04/21/2016 12:04 EDT   Therapeutic Activities/Mobility/Balance   Functional Activity :   Therapeutic Activities  Activity 1:  Transfer training; Mod A; Improved  stability in muscle group(s), Improved timing, control, and coordination, Increased activation of targeted muscle group(s); SB t/f with mod A in full trunk flex position. Assist for hand placement and lateral hip movement. Maxislide under pt for decr friction. Sit to supine with mod A for B LE assist.       Performed Date:  04/19/2016  Activity 2:  Transitional movement; Max A; Prone on elbows; Demonstrated positive response to treatment, Improved stability in muscle group(s), Improved timing, control, and coordination, Increased activation of targeted muscle group(s); transistion from L sidelying to prone on pillow then prone on elbows with max A for unweighting of chest and UE placement. Once in prone on elbows pt able to sustain with SPV x 30-60 sec.       Performed Date:  04/19/2016  Activity 3:  Dynamic standing balance; Other: balloone; Decreased endurance, Increased activation of targeted muscle group(s), Required rest breaks, Tolerated well; pt sitting in PWC c back rest reclined away to dec pt pt support, pt alos c arm rest removed on R side 2'to being placed along mat. pt worke don ball act, c vc for bue use. pt able to catch bal at center and near center of body. pt unable to perfomred  Performed Date:  04/18/2016  Activity 4:  catches off center due to due due ue rom, poor righting reactions due to dec trunk strenth. pt working on act to Social workerinc trunk enduracen, strength and sitting bal       Performed Date:  04/18/2016     Alger MemosGATES, PTA, LEANNE M - 04/21/2016 12:04 EDT   Therapeutic Exercise   Therapeutic Exercise RTF :   Therapeutic Exercise  Exercise 1:  Scapular strengthening; Supported sit; 3 x 10; scap retraction, depression, upper trap stretch to increase strength and ROM with functional mobility and transfers.       Performed Date:  04/20/2016  Exercise 2:  shld retraction x 10.  APT strecth x 5 c 30 sec hold and lat trunk stretch to L x 5. pt performed all ex to assist c trunk and ue strength and  rom to assist c cont progression and tol for fxn skilled act and to assist c progression of controled mobility       Performed Date:  04/11/2016  Exercise 3:  tilted back in power WC and reaching forward and grabing the therapists hand 2 sets of 5 reps c the R UE, 10 reps c the L UE active assisted; and then reaching c the L UE across the body 10 reps working on strengthening, coordination, and endurance.       Performed Date:  04/10/2016  Exercise 4:  sitting ankle pumps L LE for increasing ROM, strength, and endurance.       Performed Date:  04/10/2016     Alger MemosGATES, PTA, LEANNE M - 04/21/2016 12:04 EDT   Therapeutic Exercise Grid     Exercise 1  Exercise 2  Exercise 3      Comment :    pt c wc back reclined to promote inc use of trunk and abd mm's. pt worked on forward flex x 10 reaching for floor, and leaning back against reclined back rest and comming back up to sit  2 x 10. pt c use of ue to assit c ret to sit. pt perfomred Lat lean    to R on to mat c wc arm rest removed. pt hooking LUE on to leg and performing R forarm wt bearing to sit x 2 x 5. pt c chair turned to alow safem act L forarm wt bearing to sit 2 x 5. pt c less rom to L due to [facility] tear. pt however perfomferd act   act well all act perfomed to inc trunk strength and sititng bal          GATES, PTA, Melynda RippleLEANNE M - 04/21/2016 12:04 EDT  GATES, PTA, Melynda RippleLEANNE M - 04/21/2016 12:04 EDT  GATES, PTA, Melynda RippleLEANNE M - 04/21/2016 12:04 EDT       Short Term Goals   Bed Mobility Goal Grid     Goal #1          Descriptors :    Roll to right and left              Level :    Moderate assistance              Status :    Progressing, continue                Alger MemosGATES, PTA, LEANNE M - 04/21/2016 12:04 EDT         Transfers Goal Grid     Goal #1  Descriptors :    Transfer board              Level :    Moderate assistance              Status :    Progressing, continue                GATES, PTA, Melynda Ripple - 04/21/2016 12:04 EDT         W/C Management Grid     Goal #1  Goal  #2  Goal #3  Goal #4    Descriptors :    Power wheelchair mobility indoors   Pressure relief tilt in space (power wheelchair)   Power wheelchair mobility indoors   Pressure relief tilt in space (power wheelchair)     Level :    Minimal assistance   Minimal assistance   Setup, Distant supervision   Modified independence     Status :    Goal met   Goal met   Goal met   Goal met     Date Met :    04/06/2016 EDT   04/06/2016 EDT   04/14/2016 EDT   04/14/2016 EDT       GATES, PTA, Alesia Banda M - 04/21/2016 12:04 EDT  GATES, PTA, Melynda Ripple - 04/21/2016 12:04 EDT  GATES, PTA, Melynda Ripple - 04/21/2016 12:04 EDT  GATES, PTA, Melynda Ripple - 04/21/2016 12:04 EDT      PT Balance Goal Grid     Goal #1          Descriptor :    Unsupported short sit              Assist Level :    Moderate assistance              Type :    Static sitting              Length of Time (minutes) :    2 minutes              Rationale :    Improve independence with activities of daily living              Status :    Progressing, continue                GATES, PTA, Melynda Ripple - 04/21/2016 12:04 EDT         PT ST Goals Reviewed :   Yes   GATES, PTA, Melynda Ripple - 04/21/2016 12:04 EDT   Assessment   PT Impairments or Limitations :   Abnormal tone, Balance deficits, Bed mobility deficits, Coordination/Proprioception deficits, Endurance deficits, Equipment training, Impaired sensation, Pain limiting function, Range of motion deficits, Strength deficits, Transfer deficits, Transition deficits, Wheelchair mobility deficits   Barriers to Safe Discharge PT :   Medical diagnosis   Discharge Recommendations :   ELOS: 3 weeks; home with assist and PWC     PT Treatment Recommendations :   pt getting more stable in turnk and showing inc strength. pt however cont to have over all dec bal and weekness. pt will cont to benfit form skilled PT to maxmize fame ed and strength for safe ret to home .      Alger Memos - 04/21/2016 12:04 EDT   Time Spent With Patient   PT Time In :   9:00  EST   PT Time Out :   9:30 EST   PT ADL  TRAINING 15 MN :   2 units   PT ADL Training Time :   30 minutes   PT Total Individual Therapy Time :   30 minutes   PT Total Timed Code Treatment Units :   2 units   PT Total Timed Code Tx Minutes :   30 minutes   PT Total Treatment Time Rehab :   30 minutes   GATES, PTA, Melynda Ripple - 04/21/2016 12:04 EDT

## 2016-04-21 NOTE — Progress Notes (Signed)
Functional Indep Measure Scores - Text       Functional Independence Measure Scores Entered On:  04/21/2016 12:11 EDT    Performed On:  04/21/2016 12:10 EDT by Kevan Ny PTA, Melynda Ripple               Comprehension Score   Mode of Comprehension :   Auditory   Comprehends Complex or Abstract Information Without Prompting or Cueing :   Yes   Understands Complex or Abstract Directions and Conversations :   Mild difficulty   Comprehension Indep Measure Interim :   Modified independence   GATES, PTA, Melynda Ripple - 04/21/2016 12:10 EDT   Expression Score   Expression Mode :   Vocal   Expresses Complex or Abstract Information Without Prompting or Cueing :   Yes   Expresses Complex or Abstract Ideas :   Clearly and fluently at all times   Expression Indep Measure Interim :   Complete independence   Alger Memos - 04/21/2016 12:10 EDT   Social Interaction Score   Interacts Appropriately Without Supervision :   Yes   Interacts Appropriately :   At all times   Social Interaction Indep Measure Interim :   Complete independence   GATES, Lillia Mountain - 04/21/2016 12:10 EDT   Problem Solving Score   Solves Complex Problems :   No   Ability to Solve Routine Problems :   Standby, 90 percent, requires prompting 10 percent or less   Problem Solving Indep Measure Interim :   Standby prompting   Alger Memos - 04/21/2016 12:10 EDT   Memory Score   Recognizes, Remembers Routines, and Executes Requests Without Prompting :   Yes   Remembers and Executes Requests :   Mild difficulty   Memory Indep Measure Interim :   Modified independence   Alger Memos - 04/21/2016 12:10 EDT

## 2016-04-21 NOTE — Progress Notes (Signed)
OT Inpatient Daily Documentation - Text       OT Inpatient Daily Documentation Entered On:  04/21/2016 16:26 EDT    Performed On:  04/21/2016 16:22 EDT by Eliseo Gum, OT, ANN K               Reason for Treatment   *Reason for Referral :   Admitted for MS exacerbation    82 yo female with past medical history of progressive MS diagnosed in 1989, CVA with right sided weakness, hypothyroidism and neurogenic bladder admitted to [facility] in [place], [place] on 03/06/16 with recurrent generalized weakness of the upper and lower extremities and poor posture. She has been wheelchair bound for the past few years due to right-sided weakness.    (per pt, she did not have a CVA)     *Chief Complaint :   weakness     BENTON, OT, ANN K - 04/21/2016 16:22 EDT   Review/Treatments Provided   OT Goals :   OT Short Term Goals    04/14/2016  Eating Goal #1: Manage food containers/packages; Distant S; Progressing, continue  Upper Body Dressing Goal #2: Upper extremity dressing; Minimal assistance; Initial  Lower Body Dressing Goal #1: Lower extremity dressing; Supine, Supported short sit, Unsupported short sit; Max A; Progressing, continue  Bathing Goal #2: Bathe; Mod A; Initial  Toileting and Transfers Goal #1: Toilet transfers; Max A; Bedside commode; Drop arm commode, Elevated toilet seat, Grab bars, Sliding board; Progressing, continue  Toileting and Transfers Goal #2: Toileting; Max A; Drop arm commode, Elevated toilet seat, Grab bars, Sliding board; Progressing, continue  Balance Goal #1: Unsupported short sit; Static sitting; CGA; 5; Improve independence with activities of daily living; Initial     OT Plan :   Treatment Frequency: Daily Performed By: Carilyn Goodpasture M   03/30/2016  Treatment Duration: 3 Performed By: Katherine Roan   03/30/2016  Planned Treatments: Balance training, Basic Activities of Daily Living, Caregiver training, Coordination, Energy conservation training, Equipment training, Group therapy, Mobility  training, Patient education, Safety education, Therapeutic activities, Therapeutic exercises... Performed By: Katherine Roan   03/30/2016     Occupational Therapy Orders :   Occupational Therapy Medical Hold - 04/18/16 12:00:00 EDT, Stop date 04/18/16 12:00:00 EDT, for appointment at urologist's office this afternoon  OT FIMS - 03/29/16 19:38:00 EDT, Daily     Pain Present :   No actual or suspected pain   OT Therapeutic Activity,Mobility,Balance :   Yes   BENTON, OT, ANN K - 04/21/2016 16:22 EDT   Therapeutic Activities   OT Therapeutic Activities RTF :   Therapeutic Activities  Activity 1:  Other: FES Bike; Pt tolerated well. Electrodes placed on  B erectors, R obliques, R triceps, R wrist flexors and R wrist extensors. 30 min total with intervals of increased power/acceleration, up to power level 1.5 to 4.0 maintained for 30 seconds.       Performed Date:  04/19/2016  Activity 2:  Reaching anteriorly, Other: core strengthening/dynamic sitting balance; Mod A; Sitting EOM, pt anteriorly reached to touch ball to ground using BUEs for 5 reps x 2 sets with Min A for control for trunk flexion and Mod A for trunk extension to come to upright sitting posture.  Task to incr core strength/dynamic sitting balance       Performed Date:  04/10/2016  Activity 3:  Transitional movement; Mod A; Sitting in wheelchair, Supported short sit; Improved stability in muscle group(s), Improved timing, control,  and coordination, Increased activation of targeted muscle group(s), Integrated muscular activation into functional activity; pt performed lat scoot transfer with gait belt around hips and sliding board from w/c <> EOM x2.  Pt's husband performed trasnfers 1x with CGA from OT after ed.       Performed Date:  04/09/2016  Activity 4:  Other: Dynamic sitting balance; Minimal assistance; Supported short sit, Unsupported short sit; Other: Ball/bean bags; Improved stability in muscle group(s), Improved timing, control, and  coordination, Increased activation of targeted muscle group(s), Integrated muscular activation into functional activity; Pt performed anterior reaching to place bean bags into container on table top with occassional assist to maintain balance  when reachign with RUE.  Task to increase dynamic sitting balance needed for greater I with transfers and ADL performance.       Performed Date:  04/09/2016  Activity 5:  Improved stability in muscle group(s), Improved timing, control, and coordination, Increased activation of targeted muscle group(s), Integrated muscular activation into functional activity       Performed Date:  04/09/2016     Reassess Activities of Daily Living :   Yes   BENTON, OT, ANN K - 04/21/2016 16:22 EDT   OT Basic ADL   Basic ADL Grid   Eating :   Modified independence   Grooming :   Modified independence   Bathing :   Moderate assistance   UE Dressing :   Moderate assistance   LE Dressing :   Total assistance   Tub Transfer :   Does not occur   Shower Transfer :   Total assistance   BENTON, OT, ANN K - 04/21/2016 16:22 EDT   ADL Comments :   ADL 10/15-Pt total A for all transfers to shower chair from power chair and then into shower.  Pt completed bathing with modA for bottom and both feet today. Mod assist  for UB dressing for pulling down in back and bra .  Total A for LB dressing.       BENTON, OT, ANN K - 04/21/2016 16:22 EDT   Assessment   OT Impairments or Limitations :   Balance deficits, Basic activity of daily living deficits, Coordination deficits, Endurance deficits, Equipment training, IADL deficits, Mobility deficits, Proprioception deficits, Safety awareness deficits, Strength deficits   Barriers to Safe Discharge OT :   Complicated medical history, Decreased communication, Limited family support, Limited social support, Medical diagnosis, Progressive nature of disease, Safety awareness, Severity of deficits   OT Discharge Recommendations :   ELOS 3 weeks then home with spouse and  power w/c     OT Treatment Recommendations :   Patient doing well directing care and participating as she can.  Needs encouragement to perform more parts of activities as able.      BENTON, OT, ANN K - 04/21/2016 16:22 EDT   Time Spent With Patient   OT Time In :   11:45 EST   OT Time Out :   12:15 EST   OT ADL TRAINING 15 MIN :   2    OT ADL Training Minutes :   30 minutes   OT Total Individual Therapy Time :   30 minutes   OT Total Timed Code Treatment Units :   2 units   OT Total Timed Code Treatment Minutes :   30 minutes   OT Total Treatment Time Rehab :   30 minutes   BENTON, OT, ANN K - 04/21/2016 16:22 EDT

## 2016-04-21 NOTE — Progress Notes (Signed)
Functional Indep Measure Scores - Text       Functional Independence Measure Scores Entered On:  04/21/2016 16:22 EDT    Performed On:  04/21/2016 16:19 EDT by Eliseo Gum, OT, ANN K               Bathing Score   Patient's Independence Level with Bathing Tasks :   Assistance   Type of Assistance Necessary :   Assistance with bathing body parts   Body Parts Assessed :   All body surfaces   Tasks Requiring Assistance :   Arm, left, Buttocks, Lower leg and foot, left, Lower leg and foot, right   Functional Independence Measure Bathing :   Moderate assistance   BENTON, OT, ANN K - 04/21/2016 16:19 EDT   Upper Body Dressing Score   Patient's Independence Level with Upper Body Dressing Tasks :   Assistance   Type of Assistance Necessary :   Assistance with dressing tasks   Tasks Assessed :   Thread/Unthread right sleeve, Thread/Unthread left sleeve, Pull/Remove head through neckline, Pull/Remove over trunk, Hook/Unhook bra, Thread/Unthread right bra strap, Thread/Unthread left bra strap   Tasks Requiring Assistance :   Pull/Remove head through neckline, Pull/Remove over trunk, Hook/Unhook bra   UE Dressing :   Moderate assistance   BENTON, OT, ANN K - 04/21/2016 16:19 EDT   Lower Body Dressing Score   Patient's independence Level with Lower Body Dressing Tasks :   Assistance   Type of Assistance Necessary :   Requires assistance of 2 people   LE Dressing :   Total assistance   BENTON, OT, ANN K - 04/21/2016 16:19 EDT   Transfer Shower Score   Patient's independence Level with Transfer Shower Tasks :   Assistance   Type of Assistance Necessary :   Two helpers needed   Shower Transfer :   Total assistance   BENTON, OT, ANN K - 04/21/2016 16:19 EDT   Comprehension Score   Mode of Comprehension :   Auditory   Comprehends Complex or Abstract Information Without Prompting or Cueing :   Yes   Understands Complex or Abstract Directions and Conversations :   Requires extra time   Comprehension Indep Measure Interim :   Modified  independence   BENTON, OT, ANN K - 04/21/2016 16:19 EDT   Expression Score   Expression Mode :   Vocal   Expresses Complex or Abstract Information Without Prompting or Cueing :   Yes   Expresses Complex or Abstract Ideas :   Clearly and fluently at all times   Expression Indep Measure Interim :   Complete independence   BENTON, OT, ANN K - 04/21/2016 16:19 EDT   Social Interaction Score   Interacts Appropriately Without Supervision :   Yes   Interacts Appropriately :   Requires more than reasonable time to make decisions   Social Interaction Indep Measure Interim :   Modified independence   BENTON, OT, ANN K - 04/21/2016 16:19 EDT   Problem Solving Score   Solves Complex Problems :   No   Ability to Solve Routine Problems :   Standby, 90 percent, requires prompting 10 percent or less   Problem Solving Indep Measure Interim :   Standby prompting   BENTON, OT, ANN K - 04/21/2016 16:19 EDT   Memory Score   Recognizes, Remembers Routines, and Executes Requests Without Prompting :   Yes   Remembers and Executes Requests :   Mild difficulty   Memory  Indep Measure Interim :   Modified independence   BENTON, OT, ANN K - 04/21/2016 16:19 EDT

## 2016-04-21 NOTE — Nursing Note (Signed)
Medication Administration Follow Up-Text       Medication Administration Follow Up Entered On:  04/21/2016 4:19 EDT    Performed On:  04/21/2016 4:19 EDT by DAVIS, RN, BRIAN F      Intervention Information:     lorazepam  Performed by Earlene PlaterAVIS, RN, BRIAN F on 04/20/2016 22:37:00 EDT       lorazepam,1mg   Oral,insomnia       Medication Effectiveness Evaluation   Medication Administration Reason :   Insomnia   Medication Effective :   Yes   Medication Response :   Symptoms improved   DAVIS, RN, BRIAN F - 04/21/2016 4:19 EDT

## 2016-04-22 NOTE — Progress Notes (Signed)
 Interdisciplinary Team Conference - Text       Interdisciplinary Team Conference PF Entered On:  04/22/2016 10:17 EDT    Performed On:  04/22/2016 10:16 EDT by RAYMUND, RN, DEBRA A               Team Members   Care Manager :   GREG SYLIVA BROCKS   Nurse :   GEORGEANNA RN, CATHERINE   Occupational Therapist :   DARRAL, OT, MANDI R   Physical Therapist :   SHONA ALMETA CAROLYN CHRISTELLA   Rehab Physician :   MARZETTE LYNWOOD FORBES SHONA, PT, CAROLYN CHRISTELLA - 04/22/2016 11:52 EDT   Primary Care Manager :   GREG SYLIVA BROCKS   Primary Nurse :   RAYMUND RN, DEBRA A   Primary OT :   WATERS, OT, MANDI R   Primary PT :   [place], PT, AMBER R   Primary SLP :   RAYNALDO SLP, BARBARA L   Team Conference Date :   04/22/2016 EDT   RAYMUND, RN, DEBRA A - 04/22/2016 10:16 EDT   Nursing Summary.   Progress Achieved This Week :   hemorrhoids   TUELL, RN, DEBRA A - 04/22/2016 10:53 EDT   Bowel Level of Assistance Interim :   Modified independence   Bowel Management :   Incontinent   Bowel Program :   Stool softener   TUELL, RN, DEBRA A - 04/22/2016 10:16 EDT   Bowel Movement Last Date :   04/22/2016 EDT   TUELL, RN, DEBRA A - 04/22/2016 10:53 EDT     Bladder Level of Assistance Interim :   Total assistance   Bladder Management :   Incontinent   Urinary Elimination Management :   Disposable brief   Tracheostomy Information :   No qualifying data available.       Nursing Team Notes Current :   Yes   TUELL, RN, DEBRA A - 04/22/2016 10:16 EDT   OT Basic ADL   Basic ADL Grid   Eating :   Modified independence   Grooming :   Modified independence   Bathing :   Moderate assistance   UE Dressing :   Moderate assistance   LE Dressing :   Total assistance   Toileting :   Total assistance   Transfer Toilet :   Total assistance   Tub Transfer :   Does not occur   Shower Transfer :   Total assistance   WATERS, OT, MANDI R - 04/22/2016 10:18 EDT   ADL Comments :   ADL 10/15-Pt total A for all transfers to shower chair from power chair and then into shower.  Pt  completed bathing with modA for bottom and both feet today. Mod assist  for UB dressing for pulling down in back and bra .  Total A for LB dressing.       OT ADL Reviewed :   Yes   WATERS, OT, MANDI R - 04/22/2016 10:18 EDT   OT Short Term Goals.   Eating Goal Grid     Goal #1          Activity :    Manage food containers/packages              Assist :    Distant supervision              Status :    Goal met  Date Met :    04/09/2016 EDT                WATERS, OT, MANDI R - 04/22/2016 10:18 EDT         Grooming Goal Grid     Goal #1          Activity :    Grooming routine              Descriptors :    Supported short sit, Unsupported short sit, Wheelchair              Assist :    Contact guard assistance              Status :    Goal met              Date Met :    04/08/2016 EDT                WATERS, OT, MANDI R - 04/22/2016 10:18 EDT         Upper Body Dressing Short Term Goal Grid     Goal #1  Goal #2        Activity :    Upper extremity dressing   Upper extremity dressing           Assist :    Moderate assistance   Minimal assistance           Status :    Goal met   Progressing, continue             WATERS, OT, MANDI R - 04/22/2016 10:18 EDT  WATERS, OT, MANDI R - 04/22/2016 10:18 EDT        Lower Body Dressing Grid     Goal #1          Activity :    Lower extremity dressing              Lower Body Dressing Descriptors :    Supine, Supported short sit, Unsupported short sit              Assist :    Maximal assistance              Status :    Progressing, continue                WATERS, OT, MANDI R - 04/22/2016 10:18 EDT         Bathing Goal Grid     Goal #1  Goal #2        Activity :    Bathe   Bathe           Descriptors :    Supported short sit, Unsupported short sit, Wheelchair              Assist :    Maximal assistance   Moderate assistance           Status :    Goal met   Progressing, continue           Date Met :    04/08/2016 EDT                WATERS, OT, MANDI R - 04/22/2016 10:18 EDT  WATERS,  OT, MANDI R - 04/22/2016 10:18 EDT        Toileting and Transfers Goal Grid     Goal #1  Goal #2        Activity :  Toilet transfers   Toileting           Assist :    Maximal assistance   Maximal assistance           Equipment :    Bedside commode              Transfer Equipment :    Drop arm commode, Elevated toilet seat, Grab bars, Sliding board   Drop arm commode, Elevated toilet seat, Grab bars, Sliding board           Status :    Progressing, continue   Progressing, continue             WATERS, OT, MANDI R - 04/22/2016 10:18 EDT  WATERS, OT, MANDI R - 04/22/2016 10:18 EDT        OT Balance Goal Grid     Goal #1          Descriptors :    Unsupported short sit              Type :    Static sitting              Assist :    Contact guard assistance              Length of Time (minutes) :    5 minutes              Rationale :    Improve independence with activities of daily living              Status :    Initial                WATERS, OT, MANDI R - 04/22/2016 10:18 EDT         OT STG Reviewed :   Yes   WATERS, OT, MANDI R - 04/22/2016 10:18 EDT   OT Long Term Goals.   OT Long Term Goals Reviewed BETHA Chaney BUSTLE, OT, KRISTEN E - 04/22/2016 11:51 EDT   Patient/Caregiver Goals :   To get stronger, to be independent   WATERS, OT, MANDI R - 04/22/2016 10:18 EDT   OT IP Long Term Goals Grid     Long Term Goal 1  Long Term Goal 2        Goal :    Pt will perform simple meal prep activity with min assist to improve indep in ADLs   Pt will improve BUE strength to 3+/5 to improve indep in ADLS           Status :    Progressing, continue   Progressing, continue             WATERS, OT, MANDI R - 04/22/2016 10:18 EDT  WATERS, OT, MANDI R - 04/22/2016 10:18 EDT        Eating Goal   Eating Goal :   Complete independence   Grooming Goal :   Modified independence   Bathing Goal :   Minimal contact assistance   Upper Extremity Dressing :   Minimal contact assistance   Lower Body Dressing Goal :   Maximal assistance   Toileting  Goal :   Maximal assistance   Toilet Transfer Goal :   Maximal assistance   WATERS, OT, MANDI R - 04/22/2016 10:18 EDT   OT LTG Reconcilation :   Revised goals this date.   WATERS, OT, MANDI R - 04/22/2016 10:18 EDT   Occupational  Therapy Summary.   Barriers to Safe Discharge OT :   Complicated medical history, Decreased communication, Limited family support, Limited social support, Medical diagnosis, Progressive nature of disease, Safety awareness, Severity of deficits   Additional Comments DME OT :   Family is looking into SNF and might have found one. Pt wants to be out by early next week at the latest. Feels therapy is great NSG is having trouble. Has new bruse on R arm from NSG pulling it to turn her over the weekend. Frusterated with constant BMs.   OT Team Notes Current :   Yes   WATERS, OT, MANDI R - 04/22/2016 10:18 EDT   PT Mobility   Mobility Grid   Roll Left :   Rehab Moderate assistance   Roll Right :   Rehab Maximal assistance   Roll Prone :   Rehab Maximal assistance   Roll Supine :   Rehab Maximal assistance   Supine to Sit :   Rehab Maximal assistance   Sit to Supine :   Rehab Moderate assistance   Scooting :   Rehab Total assistance   Transfer Bed to and From Chair :   Rehab Moderate assistance   Transfer Toilet :   Total assistance   SHONA ALMETA CAROLYN CHRISTELLA - 04/22/2016 11:51 EDT   Functional Mobility Details :   .   SHONA ALMETA CAROLYN CHRISTELLA - 04/22/2016 11:51 EDT   Amb Ability Varied Surf/Distraction Grid   Level Surfaces :   Does not occur   Uneven Surfaces :   Does not occur   Distracting Environments :   Does not occur   Curbs :   Does not occur   Stairs :   Does not occur   Ramp :   Does not occur   HALL, PT, KAITLYN M - 04/22/2016 11:51 EDT   Transfer Type :   Transfer board   PT Mobility Reviewed :   Yes   HALL, PT, KAITLYN M - 04/22/2016 11:51 EDT   PT WC Management   Type of Wheelchair :   Power wheelchair   Wheelchair Details :   .   SHONA ALMETA CAROLYN CHRISTELLA - 04/22/2016 11:51 EDT   Wheelchair  Mobility Grid   Level Surfaces :   Rehab Modified independence   HALL, PT, KAITLYN M - 04/22/2016 11:51 EDT   Wheelchair Mobility Level Distance :   300 ft   [place], PT, KAITLYN M - 04/22/2016 11:51 EDT   Pressure Relief Grid     Wheelchair Pressure Relief Trial 1          Technique :    Tilt in space, power              Assist :    Setup              Duration (Seconds) :    120 seconds                HALL, PT, KAITLYN M - 04/22/2016 11:51 EDT         Wheelchair Mobility Reviewed :   Chaney SHONA, PT, KAITLYN M - 04/22/2016 11:51 EDT   PT Short Term Goals.   Bed Mobility Goal Grid     Goal #1          Descriptors :    Roll to right and left              Level :  Moderate assistance              Status :    Progressing, continue                [place], PT, KAITLYN M - 04/22/2016 11:51 EDT         Transfers Goal Grid     Goal #1          Descriptors :    Transfer board              Level :    Moderate assistance              Status :    Progressing, continue                HALL, PT, KAITLYN M - 04/22/2016 11:51 EDT         W/C Management Grid     Goal #1  Goal #2  Goal #3  Goal #4    Descriptors :    Power wheelchair mobility indoors   Pressure relief tilt in space (power wheelchair)   Power wheelchair mobility indoors   Pressure relief tilt in space (power wheelchair)     Level :    Minimal assistance   Minimal assistance   Setup, Distant supervision   Modified independence     Status :    Goal met   Goal met   Goal met   Goal met     Date Met :    04/06/2016 EDT   04/06/2016 EDT   04/14/2016 EDT   04/14/2016 EDT       SHONA, PT, KAITLYN M - 04/22/2016 11:51 EDT  SHONA, PT, KAITLYN M - 04/22/2016 11:51 EDT  SHONA, PT, KAITLYN M - 04/22/2016 11:51 EDT  HALL, PT, KAITLYN M - 04/22/2016 11:51 EDT      PT Balance Goal Grid     Goal #1          Descriptor :    Unsupported short sit              Assist Level :    Moderate assistance              Type :    Static sitting              Length of Time (minutes) :    2 minutes               Rationale :    Improve independence with activities of daily living              Status :    Progressing, continue                HALL, PT, KAITLYN M - 04/22/2016 11:51 EDT         PT STG Reviewed :   Chaney SHONA, PT, KAITLYN M - 04/22/2016 11:51 EDT   PT Long Term Goals.   PT Patient,Caregiver Goal :   To get back to being independent   [place], PT, KAITLYN M - 04/22/2016 11:51 EDT   Outpatient PT Long Term Goals Rehab     Long Term Goal 1          Goal :    Pt will complete car transfer with mod A in 3 weeks              Status :    Progressing, continue  [place], PT, KAITLYN M - 04/22/2016 11:51 EDT         Mobility Goals Grid   Bed, Chair, Wheelchair Goal :   Minimal contact assistance   Toilet Transfer Goal :   Maximal assistance   Wheelchair Mobility Level Surfaces Goal :   Modified independence   HALL, PT, KAITLYN M - 04/22/2016 11:51 EDT   PT LTG Reconcilation :   LTG to be met in 3 weeks  LTG ongoing   Type of Wheelchair Goal :   Manual wheelchair   Mode of Locomotion Goal :   Wheelchair   [place], PT, KAITLYN M - 04/22/2016 11:51 EDT   Physical Therapy Summary.   Additional Comments DME PT :   Minimal functional mobility progress this week. Improved balance and upright WC tolerance. Will need assist at home--discussed hiring caregivers to provide assist needed. Any change on bowel/bladder issues? Needs to be getting to toilet instead of bedpan.   PT Team Notes Current :   Yes   [place], PT, KAITLYN M - 04/22/2016 11:51 EDT   Severity Level.   Comprehension Indep Measure Interim :   Modified independence   Comprehension Mode :   Auditory   Expression Indep Measure Interim :   Complete independence   Expression Mode :   Vocal   Problem Solving Indep Measure Interim :   Standby prompting   Memory Indep Measure Interim :   Modified independence   Social Interaction Indep Measure Interim :   Modified independence   WATERS, OT, MANDI R - 04/22/2016 10:18 EDT   SLP Short Term Goals.   Other SLP Short Term  Goal Grid     Goal #1          Other :    Cognitive assessment completed.              Status :    Goal met              Date Met :    04/01/2016 EDT              Comment :    SLP signing off.                [place], PT, KAITLYN M - 04/22/2016 11:52 EDT         SLP STG Reviewed BETHA Chaney HURST, PT, KAITLYN M - 04/22/2016 11:52 EDT   Speech Therapy Summary.   Barriers to Safe Discharge SLP :   Progressive nature of disease   SLP Progress Note Current :   N/A   HALL, PT, KAITLYN M - 04/22/2016 11:52 EDT   Psychology Summary.   Psychology Progress Notes Current :   N/A   HALL, PT, KAITLYN M - 04/22/2016 11:52 EDT   Interdisciplinary Discharge Planning.   Discharge Disposition Plan :   subacute     Rehab Anticipated Discharge Date :   04/24/2016 EDT   Next Level of Care :   Home Health, Outpatient   Follow-Up Plans :   OT, Physical Therapy: To Evaulate, Treat and Manage Care of Related Diagnosis   Team Conference Discussion RTF :   Kaitlyn Hall, PT, DPT served as scribe this date    pt reported to OT that she is ready to d/c early next week to subacute, she cont to be frustrated about bowel issues, NSG to transfer to toilet instead of bedpan, SW to set up family MTG  Barriers to goals/Reasons for skilled intervention :   Bladder/bowel issues, Decreased endurance, Decreased sitting/standing balance, Decreased strength/ROM, Skin integrity issues   Medical/rehab reason for continued stay :   Anticoagulation, Bowel/bladder, Electrolyte imbalance, Home mobility issue, Infection, Medication adjustments, Paresis, Pending labs/diagnostics, Skin integrity   HALL, PT, KAITLYN M - 04/22/2016 11:52 EDT   Anticipated Therapy Interventions.   PT Estimated Hours per Week :   7.5 hours per week   HALL, PT, KAITLYN M - 04/26/2016 14:32 EDT     Therapy Activity Tolerance :   3 hours over 5 days   HALL, PT, KAITLYN M - 04/22/2016 11:52 EDT   OT Estimated Hours Per Week :   7.5 hours per week       HALL, PT, KAITLYN M - 04/26/2016 14:32 EDT      Rehab Team Goals   Team Mobility Goals Grid     Goal #1          Team Mobility Goals :    Toilet transfers              Team Mobility Assist Level :    With max assist              Team Mobility Goal Status :    Ongoing                HALL, PT, KAITLYN M - 04/22/2016 11:52 EDT         Team Mobility Goals 2 Grid     Goal #1          Team Mobility Goals :    Bed and or wheelchair transfer              Team Mobility Assist Level :    With mod assist              Team Mobility Goal Status :    Ongoing                HALL, PT, KAITLYN M - 04/22/2016 11:52 EDT         Team Pain Management Goals Grid     Goal #1          Team Pain Management Goals :    Acute pain control              Team Pain Management Goals Status :    Ongoing                HALL, PT, KAITLYN M - 04/22/2016 11:52 EDT         Team Patient/Family Ed Goal Grid     Goal #1          Team Patient/Family Education Goals :    Demonstrates compliance with precautions, Demonstrates knowledge of diagnosis, Participate in family teaching/education              Team Patient/Family Edu Goal Status :    Ongoing                HALL, PT, KAITLYN M - 04/22/2016 11:52 EDT         Team Skin Surveillance/Wound Goals Grid     Goal #1          Team Skin Surveillance/Wound Care Goals :    Skin surveillance              Skin Surveillance Wound Care Goal Status :    Ongoing  SHONA ALMETA CAROLYN CHRISTELLA - 04/22/2016 11:52 EDT         Team Goal Timed Voiding :   Q4 hours   [place], PT, KAITLYN M - 04/22/2016 11:52 EDT

## 2016-04-22 NOTE — Progress Notes (Signed)
Interdisciplinary Team Conference - Text       Interdisciplinary Team Conference PF Entered On:  04/22/2016 10:18 EDT    Performed On:  04/22/2016 10:08 EDT by Byrd Hesselbach, OT, MANDI R               Team Members   Primary Care Manager :   Julien Girt   Primary Nurse :   Farrel Demark, RN, Elvin So   Primary OT :   WATERS, OT, MANDI R   Primary PT :   Eliseo Gum, PT, AMBER R   Primary SLP :   Hilda Blades SLP, BARBARA L   Team Conference Date :   04/15/2016 EDT   WATERS, OT, MANDI R - 04/22/2016 10:08 EDT   OT Basic ADL   Basic ADL Grid   Eating :   Modified independence   Grooming :   Modified independence   Bathing :   Moderate assistance   UE Dressing :   Moderate assistance   LE Dressing :   Total assistance   Toileting :   Total assistance   Transfer Toilet :   Total assistance   Tub Transfer :   Does not occur   Shower Transfer :   Total assistance   WATERS, OT, MANDI R - 04/22/2016 10:08 EDT   ADL Comments :   ADL 10/15-Pt total A for all transfers to shower chair from power chair and then into shower.  Pt completed bathing with modA for bottom and both feet today. Mod assist  for UB dressing for pulling down in back and bra .  Total A for LB dressing.       OT ADL Reviewed :   Yes   WATERS, OT, MANDI R - 04/22/2016 10:08 EDT   OT Short Term Goals.   Eating Goal Grid     Goal #1          Activity :    Manage food containers/packages              Assist :    Distant supervision              Status :    Goal met              Date Met :    04/09/2016 EDT                WATERS, OT, MANDI R - 04/22/2016 10:08 EDT         Grooming Goal Grid     Goal #1          Activity :    Grooming routine              Descriptors :    Supported short sit, Unsupported short sit, Wheelchair              Assist :    Contact guard assistance              Status :    Goal met              Date Met :    04/08/2016 EDT                WATERS, OT, MANDI R - 04/22/2016 10:08 EDT         Upper Body Dressing Short Term Goal Grid     Goal #1  Goal #2         Activity :  Upper extremity dressing   Upper extremity dressing           Assist :    Moderate assistance   Minimal assistance           Status :    Goal met   Progressing, continue             WATERS, OT, MANDI R - 04/22/2016 10:08 EDT  WATERS, OT, MANDI R - 04/22/2016 10:08 EDT        Lower Body Dressing Grid     Goal #1          Activity :    Lower extremity dressing              Lower Body Dressing Descriptors :    Supine, Supported short sit, Unsupported short sit              Assist :    Maximal assistance              Status :    Progressing, continue                WATERS, OT, MANDI R - 04/22/2016 10:08 EDT         Bathing Goal Grid     Goal #1  Goal #2        Activity :    Bathe   Bathe           Descriptors :    Supported short sit, Unsupported short sit, Wheelchair              Assist :    Maximal assistance   Moderate assistance           Status :    Goal met   Progressing, continue           Date Met :    04/08/2016 EDT                WATERS, OT, MANDI R - 04/22/2016 10:08 EDT  WATERS, OT, MANDI R - 04/22/2016 10:08 EDT        Toileting and Transfers Goal Grid     Goal #1  Goal #2        Activity :    Toilet transfers   Toileting           Assist :    Maximal assistance   Maximal assistance           Equipment :    Bedside commode              Transfer Equipment :    Drop arm commode, Elevated toilet seat, Grab bars, Sliding board   Drop arm commode, Elevated toilet seat, Grab bars, Sliding board           Status :    Progressing, continue   Progressing, continue             WATERS, OT, MANDI R - 04/22/2016 10:08 EDT  WATERS, OT, MANDI R - 04/22/2016 10:08 EDT        OT Balance Goal Grid     Goal #1          Descriptors :    Unsupported short sit              Type :    Static sitting              Assist :    Contact guard assistance  Length of Time (minutes) :    5 minutes              Rationale :    Improve independence with activities of daily living              Status :    Initial                 WATERS, OT, MANDI R - 04/22/2016 10:08 EDT         OT STG Reviewed :   Yes   WATERS, OT, MANDI R - 04/22/2016 10:08 EDT   OT Long Term Goals.   Patient/Caregiver Goals :   To get stronger, to be independent   WATERS, OT, MANDI R - 04/22/2016 10:08 EDT   OT IP Long Term Goals Grid     Long Term Goal 1  Long Term Goal 2        Goal :    Pt will perform simple meal prep activity with min assist to improve indep in ADLs   Pt will improve BUE strength to 3+/5 to improve indep in ADLS           Status :    Progressing, continue   Progressing, continue             WATERS, OT, MANDI R - 04/22/2016 10:08 EDT  WATERS, OT, MANDI R - 04/22/2016 10:08 EDT        Eating Goal   Eating Goal :   Complete independence   Grooming Goal :   Modified independence   Bathing Goal :   Minimal contact assistance   Upper Extremity Dressing :   Minimal contact assistance   Lower Body Dressing Goal :   Maximal assistance   Toileting Goal :   Maximal assistance   Toilet Transfer Goal :   Maximal assistance   WATERS, OT, MANDI R - 04/22/2016 10:08 EDT   OT LTG Reconcilation :   Revised goals this date.    OT Long Term Goals Reviewed :   Yes   WATERS, OT, MANDI R - 04/22/2016 10:08 EDT   Occupational Therapy Summary.   Barriers to Safe Discharge OT :   Complicated medical history, Decreased communication, Limited family support, Limited social support, Medical diagnosis, Progressive nature of disease, Safety awareness, Severity of deficits   Additional Comments DME OT :   Family is looking into SNF and might have found one. Pt wants to be out by early next week at the latest. Feels therapy is great NSG is having trouble. Has new bruse on R arm from NSG pulling it to turn her over the weekend. Frusterated with constant BMs.   OT Team Notes Current :   Yes   WATERS, OT, MANDI R - 04/22/2016 10:08 EDT   Severity Level.   Comprehension Indep Measure Interim :   Modified independence   Comprehension Mode :   Auditory   Expression Indep  Measure Interim :   Complete independence   Expression Mode :   Vocal   Problem Solving Indep Measure Interim :   Standby prompting   Memory Indep Measure Interim :   Modified independence   Social Interaction Indep Measure Interim :   Modified independence   WATERS, OT, MANDI R - 04/22/2016 10:08 EDT

## 2016-04-22 NOTE — Progress Notes (Signed)
Functional Indep Measure Scores - Text       Functional Independence Measure Scores Entered On:  04/22/2016 16:39 EDT    Performed On:  04/22/2016 15:00 EDT by Regenia SkeeterICOSTANZO, PT, CRISTINA A               Walk/Wheelchair Score   Mode of Locomotion Goal :   Wheelchair   Type of Wheelchair Goal :   Power wheelchair   Mode of Locomotion on Discharge :   Wheelchair   Assessed Locomotion in a Wheelchair :   Yes   Distance Traveled in a Wheelchair :   200 ft   Amount of Assistance Necessary :   No assistance necessary   Modified Independence Qualifiers :   Patient can independently operate a manual or motorized wheelchair for a minimum of 150 feet; turns around; maneuvers the chair to a table, bed, toilet; negotiates at least a 3 percent grade; and maneuvers on rugs and over door sills   Functional Independence Measure Wheelchair :   Modified independence   DICOSTANZO, PT, CRISTINA A - 04/22/2016 16:39 EDT   Comprehension Score   Mode of Comprehension :   Auditory   Comprehends Complex or Abstract Information Without Prompting or Cueing :   Yes   Understands Complex or Abstract Directions and Conversations :   At all times   Comprehension Indep Measure Interim :   Complete independence   DICOSTANZO, PT, CRISTINA A - 04/22/2016 16:39 EDT   Expression Score   Expression Mode :   Vocal   Expresses Complex or Abstract Information Without Prompting or Cueing :   Yes   Expresses Complex or Abstract Ideas :   Clearly and fluently at all times   Expression Indep Measure Interim :   Complete independence   DICOSTANZO, PT, CRISTINA A - 04/22/2016 16:39 EDT   Social Interaction Score   Interacts Appropriately Without Supervision :   Yes   Interacts Appropriately :   At all times   Social Interaction Indep Measure Interim :   Complete independence   DICOSTANZO, PT, CRISTINA A - 04/22/2016 16:39 EDT   Problem Solving Score   Solves Complex Problems :   Yes   Ability to Solve Complex Problems :   Requires more than reasonable time to make  decisions   Problem Solving Indep Measure Interim :   Modified independence   DICOSTANZO, PT, CRISTINA A - 04/22/2016 16:39 EDT   Memory Score   Recognizes, Remembers Routines, and Executes Requests Without Prompting :   Yes   Remembers and Executes Requests :   Mild difficulty   Memory Indep Measure Interim :   Modified independence   DICOSTANZO, PT, CRISTINA A - 04/22/2016 16:39 EDT

## 2016-04-22 NOTE — Progress Notes (Signed)
PT Inpatient Daily Documentation - Text       PT Inpatient Daily Documentation Entered On:  04/22/2016 16:47 EDT    Performed On:  04/22/2016 15:00 EDT by Regenia Skeeter, PT, CRISTINA A               Reason for Treatment   Subjective Statement :   Pt agreeable to PT trx. Pt remained seated in PWC c seatbelt donned c all needs met after trx.     *Reason for Referral :   Progressive MS (dx 1989)  new weakness following UTI and C-diff    3 hrs/day     *Chief Complaint :   weakness bilat LE's, tightness R ankle/calf     DICOSTANZO, PT, CRISTINA A - 04/22/2016 16:40 EDT   Review/Treatments Provided   PT Goals :   PT Short Term Goals    04/22/2016  Bed Mobility Goal #1: Roll to right and left; Mod A; Progressing, continue  Transfer Goal #1: Transfer board; Mod A; Progressing, continue  Balance Goal #1: Unsupported short sit; Mod A; Static sitting; 2; Improve independence with activities of daily living; Progressing, continue     PT Plan :   Treatment Frequency:  Daily (modified)   Performed By: Eliseo Gum, PT, AMBER R  03/30/2016 09:30  Treatment Duration: 3 Performed By: Carney Living, AMBER R  03/30/2016 09:30  Planned Treatments: Balance training, Basic activities of daily living, Bed mobility training, Caregiver training, Electrical stimulation, Equipment training, Functional training, Manual therapy, Neuromuscular reeducation, Pain management, Posture/Body mechanics training,... Performed By: Carney Living, AMBER R  03/30/2016 09:30     Short Term Goals Reviewed :   Yes   Physical Therapy Orders :   Physical Therapy Medical Hold - 04/18/16 12:00:00 EDT, Stop date 04/18/16 12:00:00 EDT, for appt. at urologist's office this afternoon  PT FIMS - 03/29/16 19:38:00 EDT, Daily     Pain Present :   Yes actual or suspected pain   PT Therapeutic Activity,Mobility,Balance :   Yes   PT Therapeutic Exercise :   Yes   DICOSTANZO, PT, CRISTINA A - 04/22/2016 16:40 EDT   Pain Assessment   Pain Location :   Shoulder   Laterality :   Left   Self  Report Pain :   FACES pain scale   DICOSTANZO, PT, CRISTINA A - 04/22/2016 16:40 EDT   Therapeutic Activities/Mobility/Balance   Functional Activity :   Therapeutic Activities  Activity 1:  Transfer training; Mod A; Improved stability in muscle group(s), Improved timing, control, and coordination, Increased activation of targeted muscle group(s); SB t/f with mod A in full trunk flex position. Assist for hand placement and lateral hip movement. Maxislide under pt for decr friction. Sit to supine with mod A for B LE assist.       Performed Date:  04/19/2016  Activity 2:  Transitional movement; Max A; Prone on elbows; Demonstrated positive response to treatment, Improved stability in muscle group(s), Improved timing, control, and coordination, Increased activation of targeted muscle group(s); transistion from L sidelying to prone on pillow then prone on elbows with max A for unweighting of chest and UE placement. Once in prone on elbows pt able to sustain with SPV x 30-60 sec.       Performed Date:  04/19/2016  Activity 3:  Dynamic standing balance; Other: balloone; Decreased endurance, Increased activation of targeted muscle group(s), Required rest breaks, Tolerated well; pt sitting in PWC c back rest reclined away to dec pt pt support, pt  alos c arm rest removed on R side 2'to being placed along mat. pt worke don ball act, c vc for bue use. pt able to catch bal at center and near center of body. pt unable to perfomred       Performed Date:  04/18/2016  Activity 4:  catches off center due to due due ue rom, poor righting reactions due to dec trunk strenth. pt working on act to [employer] worker[employer] trunk enduracen, strength and sitting bal       Performed Date:  04/18/2016     Regenia SkeeterDICOSTANZO, PT, CRISTINA A - 04/22/2016 16:40 EDT   PT Therapeutic Activities Grid     Activity 1  Activity 2  Activity 3  Activity 4    Activity :    Transfer training      Functional mobility        Assist :    Moderate assistance, Maximal assistance      Close  supervision, Minimal assistance        Equipment :    Sliding board              Comment :    Pt transfered PWC<>Nustep c maxA for lifting and PWC<>mat x2 (1x level surface, 1x unlevel surface) c modA at level surface and maxA at uneven surface c totalA to place transfer board c pt assisting c lateral weight shift and lift LE c B UE. Pt required   assist 2* to dec strength, ROM, and balance   Pt short sit at edge of bed x4045min for balance/strengthening. Multiple trials midline ot R/L sideprop on elbow c up to minA at tricep for inc ext to obtain midline due to weakness. ball tossing in midline and slightly outside BOS to R/L c 2nd person guard   posteriorly and max tacitle cues for upright posture and ant pevlic tilit, trunk rotation c ball in B UE c OP at end range for inc ROM       DICOSTANZO, PT, CRISTINA A - 04/22/2016 16:40 EDT  Regenia SkeeterICOSTANZO, PT, CRISTINA A - 04/22/2016 16:40 EDT  Regenia SkeeterICOSTANZO, PT, CRISTINA A - 04/22/2016 16:40 EDT  Regenia SkeeterICOSTANZO, PT, CRISTINA A - 04/22/2016 16:40 EDT      Reassess Mobility :   Yes   DICOSTANZO, PT, CRISTINA A - 04/22/2016 16:40 EDT   PT Mobility   Mobility Grid   Roll Left :   Rehab Moderate assistance   Roll Right :   Rehab Maximal assistance   Roll Prone :   Rehab Maximal assistance   Roll Supine :   Rehab Maximal assistance   Supine to Sit :   Rehab Maximal assistance   Sit to Supine :   Rehab Moderate assistance   Scooting :   Rehab Total assistance   Transfer Bed to and From Chair :   Rehab Moderate assistance   Transfer Toilet :   Total assistance   DICOSTANZO, PT, CRISTINA A - 04/22/2016 16:40 EDT   Functional Mobility Details :   .   Regenia SkeeterICOSTANZO, PT, CRISTINA A - 04/22/2016 16:40 EDT   Amb Ability Varied Surf/Distraction Grid   Level Surfaces :   Does not occur   Uneven Surfaces :   Does not occur   Distracting Environments :   Does not occur   Curbs :   Does not occur   Stairs :   Does not occur   Ramp :   Does not occur   DICOSTANZO, PT, CRISTINA A - 04/22/2016  16:40 EDT    Transfer Type :   Transfer board   PT Mobility Reviewed :   Yes   DICOSTANZO, PT, CRISTINA A - 04/22/2016 16:40 EDT   Therapeutic Exercise   Therapeutic Exercise RTF :   Therapeutic Exercise  Exercise 1:  pt c wc back reclined to promote inc use of trunk and abd mm's. pt worked on forward flex x 10 reaching for floor, and leaning back against reclined back rest and comming back up to sit  2 x 10. pt c use of ue to assit c ret to sit. pt perfomred Lat lean       Performed Date:  04/21/2016  Exercise 2:  to R on to mat c wc arm rest removed. pt hooking LUE on to leg and performing R forarm wt bearing to sit x 2 x 5. pt c chair turned to alow safem act L forarm wt bearing to sit 2 x 5. pt c less rom to L due to [facility] of RTC tear. pt however perfomferd act       Performed Date:  04/21/2016  Exercise 3:  act well all act perfomed to inc trunk strength and sititng bal       Performed Date:  04/21/2016  Exercise 4:  sitting ankle pumps L LE for increasing ROM, strength, and endurance.       Performed Date:  04/10/2016     Griselda Miner, CRISTINA A - 04/22/2016 16:40 EDT   Therapeutic Exercise Grid     Exercise 1  Exercise 2        Exercise :    Lower extremity strengthening              Position :    Supported sit              Equipment/Device :    NuStep              Comment :    Pt requesting trialing Nustep (see transfer in ther act) pt required R hand with ace wrap to maintain grip 2* to dec grip strength c B LE positioned with therband 3 trials: 4 min, 1:37 sec, and 2:30 sec to fatigue c rest breaks 2-5 minutes c palpable cont   ractions at B quads L>R             DICOSTANZO, PT, CRISTINA A - 04/22/2016 16:40 EDT  DICOSTANZO, PT, CRISTINA A - 04/22/2016 16:40 EDT        Short Term Goals   Bed Mobility Goal Grid     Goal #1          Descriptors :    Roll to right and left              Level :    Moderate assistance              Status :    Progressing, continue                DICOSTANZO, PT, CRISTINA A - 04/22/2016  16:40 EDT         Transfers Goal Grid     Goal #1          Descriptors :    Transfer board              Level :    Moderate assistance              Status :  Progressing, continue                DICOSTANZO, PT, CRISTINA A - 04/22/2016 16:40 EDT         W/C Management Grid     Goal #1  Goal #2  Goal #3  Goal #4    Descriptors :    Power wheelchair mobility indoors   Pressure relief tilt in space (power wheelchair)   Power wheelchair mobility indoors   Pressure relief tilt in space (power wheelchair)     Level :    Minimal assistance   Minimal assistance   Setup, Distant supervision   Modified independence     Status :    Goal met   Goal met   Goal met   Goal met     Date Met :    04/06/2016 EDT   04/06/2016 EDT   04/14/2016 EDT   04/14/2016 EDT       Regenia Skeeter, PT, CRISTINA A - 04/22/2016 16:40 EDT  Regenia Skeeter, PT, CRISTINA A - 04/22/2016 16:40 EDT  Regenia Skeeter, PT, CRISTINA A - 04/22/2016 16:40 EDT  DICOSTANZO, PT, CRISTINA A - 04/22/2016 16:40 EDT      PT Balance Goal Grid     Goal #1          Descriptor :    Unsupported short sit              Assist Level :    Moderate assistance              Type :    Static sitting              Length of Time (minutes) :    2 minutes              Rationale :    Improve independence with activities of daily living              Status :    Progressing, continue                DICOSTANZO, PT, CRISTINA A - 04/22/2016 16:40 EDT         PT ST Goals Reviewed :   Jeanie Sewer, PT, CRISTINA A - 04/22/2016 16:40 EDT   Education   Home Caregiver Name/Relationship :   Boykin Peek, PT, CRISTINA A - 04/22/2016 16:40 EDT   Physical Therapy Education Grid   Bed to Chair Transfers :   TEFL teacher understanding, Needs further teaching, Needs practice/supervision   Exercise Program :   Verbalizes understanding, Needs further teaching, Needs practice/supervision   DICOSTANZO, PT, CRISTINA A - 04/22/2016 16:40 EDT   Assessment   PT Impairments or Limitations :   Abnormal tone,  Balance deficits, Bed mobility deficits, Coordination/Proprioception deficits, Endurance deficits, Equipment training, Impaired sensation, Pain limiting function, Range of motion deficits, Strength deficits, Transfer deficits, Transition deficits, Wheelchair mobility deficits   Barriers to Safe Discharge PT :   Medical diagnosis   Discharge Recommendations :   ELOS: 3 weeks; home with assist and PWC     PT Treatment Recommendations :   Pt will continue to benefit from skilled PT to address deficits and progress towards goals to maximize function and increase independence.     DICOSTANZO, PT, CRISTINA A - 04/22/2016 16:40 EDT   Time Spent With Patient   PT Time In :   15:00 EST   PT Time Out :   16:30 EST   PT  Therapeutic Exercise Units :   4 units   PT Therapeutic Exercise Time :   60 minutes   PT ADL TRAINING 15 MN :   2 units   PT ADL Training Time :   30 minutes   PT Total Individual Therapy Time :   90 minutes   PT Total Timed Code Treatment Units :   6 units   PT Total Timed Code Tx Minutes :   90 minutes   PT Total Treatment Time Rehab :   90 minutes   DICOSTANZO, PT, CRISTINA A - 04/22/2016 16:40 EDT

## 2016-04-22 NOTE — Nursing Note (Signed)
Medication Administration Follow Up-Text       Medication Administration Follow Up Entered On:  04/22/2016 0:30 EDT    Performed On:  04/22/2016 0:30 EDT by Florentina JennyALLION, RN, KELLY A      Intervention Information:     lorazepam  Performed by Florentina JennyALLION, RN, KELLY A on 04/21/2016 23:23:00 EDT       lorazepam,1mg   Oral,insomnia       Medication Effectiveness Evaluation   Medication Administration Reason :   Agitation, Insomnia   Medication Effective :   Yes   Medication Response :   Symptoms improved   CALLION, RN, KELLY A - 04/22/2016 0:30 EDT

## 2016-04-22 NOTE — Progress Notes (Signed)
Functional Indep Measure Scores - Text       Functional Independence Measure Scores Entered On:  04/22/2016 12:29 EDT    Performed On:  04/22/2016 9:00 EDT by WATERS, OT, MANDI R               Bathing Score   Patient's Independence Level with Bathing Tasks :   Assistance   Type of Assistance Necessary :   Assistance with bathing body parts   Body Parts Assessed :   All body surfaces   Tasks Requiring Assistance :   Lower leg and foot, left, Lower leg and foot, right, Perineal area, Upper leg, left, Upper leg, right   Functional Independence Measure Bathing :   Moderate assistance   WATERS, OT, MANDI R - 04/22/2016 12:27 EDT   Upper Body Dressing Score   Patient's Independence Level with Upper Body Dressing Tasks :   Assistance   Type of Assistance Necessary :   Assistance with dressing tasks   Tasks Assessed :   Thread/Unthread right sleeve, Thread/Unthread left sleeve, Pull/Remove head through neckline, Pull/Remove over trunk   Tasks Requiring Assistance :   Pull/Remove head through neckline, Pull/Remove over trunk   UE Dressing :   Moderate assistance   WATERS, OT, MANDI R - 04/22/2016 12:27 EDT   Lower Body Dressing Score   Patient's independence Level with Lower Body Dressing Tasks :   Assistance   Type of Assistance Necessary :   Assistance with dressing tasks   Tasks Assessed :   All dressing tasks   Tasks Requiring Assistance :   All dressing tasks   LE Dressing :   Total assistance   WATERS, OT, MANDI R - 04/22/2016 12:27 EDT   Toileting Score   Patient's Independence Level with Toileting Tasks :   Assistance   Type of Assistance Necessary :   Assistance with toileting tasks   Amount of Assistance Needed :   Adjusting clothing before toileting, Adjusting clothing after toileting, Cleansing perineal area   Toileting :   Total assistance   WATERS, OT, MANDI R - 04/22/2016 12:27 EDT   Transfer Bed/Chair/WC Score   Patient's independence Level with Bed, Chair, Wheelchair Tasks :   Assistance   Type of Assistance  Necessary :   Assistance for more than half (patient performs 25% - 49% of tasks)   Bed, Chair, Wheelchair Transfer :   Maximal assistance   [place]amar BeachWATERS, OT, MANDI R - 04/22/2016 12:27 EDT   Transfer Toilet Score   Patient's Independence Level with Transfer Toilet Tasks :   Assistance   Amount of Assistance Necessary :   Assistance for more than half (patient performs 25% - 49% of tasks), Total assist (patient performs less than 25%)   Transfer Toilet :   Total assistance   WATERS, OT, MANDI R - 04/22/2016 12:27 EDT   Comprehension Score   Mode of Comprehension :   Auditory   Comprehends Complex or Abstract Information Without Prompting or Cueing :   Yes   Understands Complex or Abstract Directions and Conversations :   At all times   Comprehension Indep Measure Interim :   Complete independence   WATERS, OT, MANDI R - 04/22/2016 12:27 EDT   Expression Score   Expression Mode :   Vocal   Expresses Complex or Abstract Information Without Prompting or Cueing :   Yes   Expresses Complex or Abstract Ideas :   Clearly and fluently at all times   Expression  Indep Measure Interim :   Complete independence   WATERS, OT, MANDI R - 04/22/2016 12:27 EDT   Social Interaction Score   Interacts Appropriately Without Supervision :   Yes   Interacts Appropriately :   At all times   Social Interaction Indep Measure Interim :   Complete independence   WATERS, OT, MANDI R - 04/22/2016 12:27 EDT   Problem Solving Score   Solves Complex Problems :   Yes   Ability to Solve Complex Problems :   Consistently solves problems independently   Problem Solving Indep Measure Interim :   Complete independence   WATERS, OT, MANDI R - 04/22/2016 12:27 EDT   Memory Score   Recognizes, Remembers Routines, and Executes Requests Without Prompting :   Yes   Remembers and Executes Requests :   Consistently without need for repetition   Memory Indep Measure Interim :   Complete independence   WATERS, OT, MANDI R - 04/22/2016 12:27 EDT

## 2016-04-22 NOTE — Nursing Note (Signed)
Medication Administration Follow Up-Text       Medication Administration Follow Up Entered On:  04/22/2016 23:25 EDT    Performed On:  04/22/2016 21:29 EDT by Bonne DoloresMadore, RN, Madlyn FrankelAllison M      Intervention Information:     lorazepam  Performed by Bonne DoloresMadore, RN, Madlyn FrankelAllison M on 04/22/2016 20:29:00 EDT       lorazepam,1mg   Oral,insomnia       Medication Effectiveness Evaluation   Medication Administration Reason :   Insomnia   Medication Effective :   Yes   Bonne DoloresMadore, RMadlyn Frankel, Allison M - 04/22/2016 23:25 EDT

## 2016-04-22 NOTE — Progress Notes (Signed)
OT Inpatient Daily Documentation - Text       OT Inpatient Daily Documentation Entered On:  04/22/2016 12:33 EDT    Performed On:  04/22/2016 9:00 EDT by WATERS, OT, MANDI R               Reason for Treatment   *Reason for Referral :   Admitted for MS exacerbation    82 yo female with past medical history of progressive MS diagnosed in 1989, CVA with right sided weakness, hypothyroidism and neurogenic bladder admitted to [facility] in [place], [place] on 03/06/16 with recurrent generalized weakness of the upper and lower extremities and poor posture. She has been wheelchair bound for the past few years due to right-sided weakness.    (per pt, she did not have a CVA)     *Chief Complaint :   weakness     WATERS, OT, MANDI R - 04/22/2016 12:30 EDT   Review/Treatments Provided   OT Goals :   OT Short Term Goals    04/22/2016  Upper Body Dressing Goal #2: Upper extremity dressing; Minimal assistance; Progressing, continue  Lower Body Dressing Goal #1: Lower extremity dressing; Supine, Supported short sit, Unsupported short sit; Max A; Progressing, continue  Bathing Goal #2: Bathe; Mod A; Progressing, continue  Toileting and Transfers Goal #1: Toilet transfers; Max A; Bedside commode; Drop arm commode, Elevated toilet seat, Grab bars, Sliding board; Progressing, continue  Toileting and Transfers Goal #2: Toileting; Max A; Drop arm commode, Elevated toilet seat, Grab bars, Sliding board; Progressing, continue  Balance Goal #1: Unsupported short sit; Static sitting; CGA; 5; Improve independence with activities of daily living; Initial     OT Plan :   Treatment Frequency: Daily Performed By: Carilyn Goodpasture M   03/30/2016  Treatment Duration: 3 Performed By: Katherine Roan   03/30/2016  Planned Treatments: Balance training, Basic Activities of Daily Living, Caregiver training, Coordination, Energy conservation training, Equipment training, Group therapy, Mobility training, Patient education, Safety education,  Therapeutic activities, Therapeutic exercises... Performed By: Katherine Roan   03/30/2016     Occupational Therapy Orders :   Occupational Therapy Medical Hold - 04/18/16 12:00:00 EDT, Stop date 04/18/16 12:00:00 EDT, for appointment at urologist's office this afternoon  OT FIMS - 03/29/16 19:38:00 EDT, Daily     Pain Present :   No actual or suspected pain   OT Therapeutic Activity,Mobility,Balance :   Yes   OT Therapeutic Exercise :   Yes   WATERS, OT, MANDI R - 04/22/2016 12:30 EDT   Therapeutic Activities   OT Therapeutic Activities RTF :   Therapeutic Activities  Activity 1:  Other: FES Bike; Pt tolerated well. Electrodes placed on  B erectors, R obliques, R triceps, R wrist flexors and R wrist extensors. 30 min total with intervals of increased power/acceleration, up to power level 1.5 to 4.0 maintained for 30 seconds.       Performed Date:  04/19/2016  Activity 2:  Reaching anteriorly, Other: core strengthening/dynamic sitting balance; Mod A; Sitting EOM, pt anteriorly reached to touch ball to ground using BUEs for 5 reps x 2 sets with Min A for control for trunk flexion and Mod A for trunk extension to come to upright sitting posture.  Task to incr core strength/dynamic sitting balance       Performed Date:  04/10/2016  Activity 3:  Transitional movement; Mod A; Sitting in wheelchair, Supported short sit; Improved stability in muscle group(s), Improved timing, control,  and coordination, Increased activation of targeted muscle group(s), Integrated muscular activation into functional activity; pt performed lat scoot transfer with gait belt around hips and sliding board from w/c <> EOM x2.  Pt's husband performed trasnfers 1x with CGA from OT after ed.       Performed Date:  04/09/2016  Activity 4:  Other: Dynamic sitting balance; Minimal assistance; Supported short sit, Unsupported short sit; Other: Ball/bean bags; Improved stability in muscle group(s), Improved timing, control, and coordination,  Increased activation of targeted muscle group(s), Integrated muscular activation into functional activity; Pt performed anterior reaching to place bean bags into container on table top with occassional assist to maintain balance  when reachign with RUE.  Task to increase dynamic sitting balance needed for greater I with transfers and ADL performance.       Performed Date:  04/09/2016  Activity 5:  Improved stability in muscle group(s), Improved timing, control, and coordination, Increased activation of targeted muscle group(s), Integrated muscular activation into functional activity       Performed Date:  04/09/2016     Reassess Activities of Daily Living :   Yes   WATERS, OT, MANDI R - 04/22/2016 12:30 EDT   OT Basic ADL   Basic ADL Grid   Eating :   Modified independence   Grooming :   Modified independence   Bathing :   Moderate assistance   UE Dressing :   Moderate assistance   LE Dressing :   Total assistance   Toileting :   Total assistance   Transfer Toilet :   Total assistance   Tub Transfer :   Does not occur   Shower Transfer :   Total assistance   WATERS, OT, MANDI R - 04/22/2016 12:30 EDT   ADL Comments :   ADL 10/16-Pt total A for all transfers to toilet seat from bed, toilet seat to power w/c.  Pt completed bathing with mod A for bottom and both feet today. Mod A for UB dressing for pulling down in back and bra .  Total A for LB dressing and toileting.      OT ADL Reviewed :   Yes   WATERS, OT, MANDI R - 04/22/2016 12:30 EDT   Therapeutic Exercise   Therapeutic Exercise RTF :   Therapeutic Exercise  Exercise 1:  Upper extremity active range; Active exercises reaching forward for both UE range and core balance, both hands together protracting and extending UEs at shoulder level.       Performed Date:  04/20/2016  Exercise 2:  Upper extremity strengthening; Gentle resistive exercises for right hand for working on increasing extension.       Performed Date:  04/20/2016     WATERS, OT, MANDI R - 04/22/2016  12:30 EDT   OT Therapeutic Exercise Grid     Exercise 1          Comments :    Pt pulled 10 items out of yellow theraputty using B hands and increased time.                 WATERS, OT, MANDI R - 04/22/2016 12:30 EDT         Assessment   OT Impairments or Limitations :   Balance deficits, Basic activity of daily living deficits, Coordination deficits, Endurance deficits, Equipment training, IADL deficits, Mobility deficits, Proprioception deficits, Safety awareness deficits, Strength deficits   Barriers to Safe Discharge OT :   Complicated medical history, Decreased communication, Limited  family support, Limited social support, Medical diagnosis, Progressive nature of disease, Safety awareness, Severity of deficits   OT Discharge Recommendations :   ELOS 3 weeks then home with spouse and power w/c     OT Treatment Recommendations :   Pt continues to req skilled OT services to increase strength, AROM, endurance, ADL's, and mobility.     WATERS, OT, MANDI R - 04/22/2016 12:30 EDT   Time Spent With Patient   OT Time In :   9:00 EST   OT Time Out :   10:30 EST   OT ADL TRAINING 15 MIN :   5    OT ADL Training Minutes :   75 minutes   OT Therapeutic Exercise Units :   1 units   OT Therapeutic Exercise Time :   15 minutes   OT Total Individual Therapy Time :   90 minutes   OT Total Timed Code Treatment Units :   6 units   OT Total Timed Code Treatment Minutes :   90 minutes   OT Total Treatment Time Rehab :   90 minutes   WATERS, OT, MANDI R - 04/22/2016 12:30 EDT

## 2016-04-23 NOTE — Progress Notes (Signed)
 PT Inpatient Discharge Exam -Text       PT Inpatient Discharge Exam Entered On:  04/23/2016 13:39 EDT    Performed On:  04/23/2016 8:30 EDT by LORELLA, PT, AMBER R               Reason for Treatment   *Reason for Referral :   Progressive MS (dx 1989)  new weakness following UTI and C-diff    3 hrs/day     *Chief Complaint :   weakness bilat LE's, tightness R ankle/calf     BENTON, PT, AMBER R - 04/23/2016 13:31 EDT   Home Environment   Living Environment :   Home Environment  *ADL:  Assist needed  Performed By:  JEANNETT, SLP, POWELL L 03/30/2016  *Mobility:  Independent  Performed By:  JEANNETT, SLP, HEATHER L 03/30/2016  Lives In:  Single level home  Performed By:  JEANNETT, SLP, POWELL L 03/30/2016  Lives With:  Family, Spouse  Performed By:  JEANNETT, SLP, POWELL L 03/30/2016  Living Situation:  Home with family support  Performed By:  JEANNETT EARL POWELL LITTIE 03/30/2016  Patient's Responsibilities:  Leisure/Play/Hobbies, Personal ADL, Social participation  Performed By:  JEANNETT EARL POWELL LITTIE 03/30/2016  Kitchen:  1st floor  Performed By:  LORELLA, PT, AMBER R 03/30/2016  Laundry:  1st floor  Performed By:  LORELLA, PT, AMBER R 03/30/2016  Primary Bathroom:  1st floor  Performed By:  LORELLA KLEIN, AMBER R 03/30/2016  Primary Bedroom:  1st floor  Performed By:  LORELLA KLEIN, AMBER R 03/30/2016  *Cognitive-Communication Skills:  Independent  Performed By:  DELIAH GILLIE SONNY CHRISTELLA 03/30/2016  *Instrumental ADL:  Assist needed  Performed By:  DELIAH GILLIE SONNY CHRISTELLA 03/30/2016  Detail Areas of Responsibilities:  lives in [place] with husband in [place] . Has a power w/c. Does not drive. Has shower chair and BSC.  Performed By:  HOBBY, OT, LESLIE M 03/30/2016  Devices/Equipment at Home:  Commode (3 in 1) BSC, Grab bars, Other: shower chair  Performed By:  DELIAH GILLIE SONNY CHRISTELLA 03/30/2016  Prior Accessibility Options:  Bathroom modifications  Performed By:  HOBBY, OT, LESLIE M 03/30/2016  Professional Skilled Services:  Occupational Therapy,  Physical Therapy  Performed By:  LAMON, RN, MARIA NORLIS 03/29/2016     Living Situation :   Home with family support   Lives With :   Family, Spouse   Lives In :   Single level home   Prior Accessibility Options :   Bathroom modifications   BENTON, PT, AMBER R - 04/23/2016 13:31 EDT   Home Setup   Primary Bedroom :   1st floor   Primary Bathroom :   1st floor   Kitchen :   1st floor   Laundry :   1st floor   [place], PT, AMBER R - 04/23/2016 13:31 EDT   Patient's Responsibilities :   Leisure/Play/Hobbies, Personal ADL, Social participation   Detail Areas of Responsibilities :   lives in [place] with husband in [place] . Has a power w/c. Does not drive. Has shower chair and BSC.   LORELLA, PT, AMBER R - 04/23/2016 13:31 EDT   Home Environment II   Living Environment :   Home Environment  Devices/Equipment at Home:  Wheelchair  Performed By:  LAMON, RN, MARIA NORLIS 03/29/2016  Living Situation:  Home with family support  Performed By:  LAMON PEAK, MARIA NORLIS 03/29/2016  Professional Skilled Services:  Occupational Therapy, Physical Therapy  Performed By:  LAMON, RN,  MARIA NORLIS 03/29/2016  Kitchen:  1st floor  Performed By:  Karen Janann NOVAK 03/28/2016  Laundry:  1st floor  Performed By:  Karen Janann B 03/28/2016  Lives In:  Single level home  Performed By:  Karen Janann NOVAK 03/28/2016  Lives With:  Family, Spouse  Performed By:  Karen Janann B 03/28/2016  Patient's Responsibilities:  Leisure/Play/Hobbies, Personal ADL, Social participation  Performed By:  Karen Janann NOVAK 03/28/2016  Primary Bathroom:  1st floor  Performed By:  Karen Janann B 03/28/2016  Primary Bedroom:  1st floor  Performed By:  Karen Janann B 03/28/2016     Devices/Equipment :   Commode (3 in 1) BSC, Grab bars, Other: shower chair   BENTON, PT, AMBER R - 04/23/2016 13:31 EDT   Prior Functional Status   ADL :   Assist needed   Mobility :   Independent   Instrumental ADL :   Assist needed   Cognitive-Communication Skills :   Independent    LORELLA, PT, AMBER R - 04/23/2016 13:31 EDT   Additional Information :   mobility independent from PWC level   BENTON, PT, AMBER R - 04/23/2016 13:31 EDT   LE ROM/Strength   Left Lower Extremity Strength Grid   Hip Flexion :   2-   Hip Extension :   2-   Hip Abduction :   2-   Knee Flexion :   2   Knee Extension :   3   Ankle Dorsiflexion :   3   Ankle Plantarflexion :   3   BENTON, PT, AMBER R - 04/23/2016 13:31 EDT   Right Lower Extremity Strength Grid   Hip Flexion :   0   Hip Extension :   1   Hip Abduction :   0   Knee Flexion :   2-   Knee Extension :   2-   Ankle Dorsiflexion :   1   Ankle Plantarflexion :   1   BENTON, PT, AMBER R - 04/23/2016 13:31 EDT   UE ROM/Strength   Lt Upper Extremity Strength :   Other: hx of rotator cuff tear   BENTON, PT, AMBER R - 04/23/2016 13:31 EDT   Left Upper Extremity Strength Grid   Shoulder Flexion :   2-   Shoulder Extension :   2-   Elbow Flexion :   3+   Elbow Extension :   3+   Wrist Flexion :   4   Wrist Extension :   4   Finger Flexion :   3+   [place], PT, AMBER R - 04/23/2016 13:31 EDT   Rt Upper Extremity Strength :   Other: hx of ulnar nerve surgery   BENTON, PT, AMBER R - 04/23/2016 13:31 EDT   Right Upper Extremity Strength Grid   Shoulder Flexion :   1   Shoulder Extension :   1   Elbow Flexion :   2   Elbow Extension :   2   Wrist Flexion :   2   Wrist Extension :   2   Finger Flexion :   2-   [place]ENT[place], PT, AMBER R - 04/23/2016 13:31 EDT   Sensation   Left Upper Extremity Sensation   Light Touch :   Impaired   Sharp/Dull :   Impaired   BENTON, PT, AMBER R - 04/23/2016 13:31 EDT   Right Upper Extremity Sensation  Light Touch :   Impaired   Sharp/Dull :   Impaired   BENTON, PT, AMBER R - 04/23/2016 13:31 EDT   Impact of Impaired UE Sensation :   numbness/tingling in BUE   BENTON, PT, AMBER R - 04/23/2016 13:31 EDT   Left Lower Extremity Sensation   Light Touch :   Impaired   Sharp/Dull :   Impaired   BENTON, PT, AMBER R - 04/23/2016 13:31 EDT   Right Lower  Extremity Sensation   Light Touch :   Impaired   Sharp/Dull :   Impaired   BENTON, PT, AMBER R - 04/23/2016 13:31 EDT   Sitting Balance   Static Sitting Balance Assessment Grid   Sits Without UE Support :   Setup   Sits With One UE Support :   Setup   Sits With Two UE Support :   Setup   BENTON, PT, AMBER R - 04/23/2016 13:31 EDT   Sitting Surface Evaluated Upon :   Bed   BENTON, PT, AMBER R - 04/23/2016 13:31 EDT   Dynamic Sitting Balance Assessment Grid   Anterior Shift :   Able   Posterior Shift :   Able   Lateral to the Left Shift :   Able   Lateral to the Right Shift :   Able   BENTON, PT, AMBER R - 04/23/2016 13:31 EDT   Righting Reactions Grid   Left Protective Reactions :   Delayed   Right Protective Reactions :   Delayed   Left Righting Reactions :   Delayed   Right Righting Reactions :   Delayed   BENTON, PT, AMBER R - 04/23/2016 13:31 EDT   PT Mobility   Mobility Grid   Roll Left :   Rehab Moderate assistance   Roll Right :   Rehab Moderate assistance   Roll Supine :   Rehab Maximal assistance   Supine to Sit :   Rehab Maximal assistance   Sit to Supine :   Rehab Moderate assistance   Scooting :   Rehab Maximal assistance   Transfer Bed to and From Chair :   Rehab Maximal assistance   Transfer Toilet :   Total assistance   Car Transfer :   Rehab Total assistance   BENTON, PT, AMBER R - 04/23/2016 13:31 EDT   Functional Mobility Details :   .   BENTON, PT, AMBER R - 04/23/2016 13:31 EDT   Amb Ability Varied Surf/Distraction Grid   Level Surfaces :   Does not occur   Uneven Surfaces :   Does not occur   Distracting Environments :   Does not occur   Curbs :   Does not occur   Stairs :   Does not occur   Ramp :   Does not occur   BENTON, PT, AMBER R - 04/23/2016 13:31 EDT   Transfer Type :   Transfer board   PT Mobility Reviewed :   Yes   BENTON, PT, AMBER R - 04/23/2016 13:31 EDT   PT WC Management   Type of Wheelchair :   Power wheelchair   Wheelchair Details :   .   LORELLA KLEIN, AMBER R - 04/23/2016 13:31  EDT   Wheelchair Mobility Grid   Level Surfaces :   Rehab Modified independence   [place], PT, AMBER R - 04/23/2016 13:31 EDT   Wheelchair Mobility Level Distance :   200 ft   [place], PT, AMBER R - 04/23/2016 13:31  EDT   Pressure Relief Grid     Wheelchair Pressure Relief Trial 1          Technique :    Tilt in space, power              Assist :    Setup              Duration (Seconds) :    120 seconds                BENTON, PT, AMBER R - 04/23/2016 13:31 EDT         Wheelchair Mobility Reviewed :   Yes   BENTON, PT, AMBER R - 04/23/2016 13:31 EDT   Assessment   PT Impairments or Limitations :   Abnormal tone, Balance deficits, Bed mobility deficits, Coordination/Proprioception deficits, Endurance deficits, Equipment training, Impaired sensation, Pain limiting function, Range of motion deficits, Strength deficits, Transfer deficits, Transition deficits, Wheelchair mobility deficits   Barriers to Safe Discharge PT :   Medical diagnosis   Discharge Recommendations :   ELOS: 3 weeks; home with assist and PWC     PT Treatment Recommendations :   Pt is 82 year old female with MS and new onset weakness following UTI and c-diff. She has progressed with balance and endurance during IP rehab stay, but continues to require max A for transfers using sliding board to [facility] at this time. She will cont to benefit from skilled PT following DC to progress indep with functional transfers.      BENTON, PT, AMBER R - 04/23/2016 13:31 EDT   Short Term Goals   Bed Mobility Goal Grid     Goal #1          Descriptors :    Roll to right and left              Level :    Moderate assistance              Status :    Goal met                BENTON, PT, AMBER R - 04/23/2016 13:31 EDT         Transfers Goal Grid     Goal #1          Descriptors :    Transfer board              Level :    Moderate assistance              Status :    Not met                BENTON, PT, AMBER R - 04/23/2016 13:31 EDT         W/C Management Grid     Goal #1  Goal #2   Goal #3  Goal #4    Descriptors :    Power wheelchair mobility indoors   Pressure relief tilt in space (power wheelchair)   Power wheelchair mobility indoors   Pressure relief tilt in space (power wheelchair)     Level :    Minimal assistance   Minimal assistance   Setup, Distant supervision   Modified independence     Status :    Goal met   Goal met   Goal met   Goal met     Date Met :    04/06/2016 EDT   04/06/2016 EDT   04/14/2016 EDT  04/14/2016 EDT       BENTON, PT, AMBER R - 04/23/2016 13:31 EDT  LORELLA, PT, AMBER R - 04/23/2016 13:31 EDT  LORELLA, PT, AMBER R - 04/23/2016 13:31 EDT  BENTON, PT, AMBER R - 04/23/2016 13:31 EDT      PT Balance Goal Grid     Goal #1          Descriptor :    Unsupported short sit              Assist Level :    Moderate assistance              Type :    Static sitting              Length of Time (minutes) :    2 minutes              Rationale :    Improve independence with activities of daily living              Status :    Goal met                BENTON, PT, AMBER R - 04/23/2016 13:31 EDT         PT ST Goals Reviewed :   Chaney LORELLA, PT, AMBER R - 04/23/2016 13:31 EDT   Long Term Goals   PT Patient,Caregiver Goal :   To get back to being independent   BENTON, PT, AMBER R - 04/23/2016 13:31 EDT   Outpatient PT Long Term Goals Rehab     Long Term Goal 1          Goal :    Pt will complete car transfer with mod A in 3 weeks              Status :    Not met                BENTON, PT, AMBER R - 04/23/2016 13:31 EDT         Mobility Goals Grid   Bed, Chair, Wheelchair Goal :   Minimal contact assistance   (Comment: NOT MET [BENTON, PT, AMBER R - 04/23/2016 13:31 EDT] )   Wheelchair Mobility Level Surfaces Goal :   Modified independence   (Comment: MET [BENTON, PT, AMBER R - 04/23/2016 13:31 EDT] )   LORELLA, PT, AMBER R - 04/23/2016 13:31 EDT   PT LTG Reconcilation :   LTG to be met in 3 weeks  LTG ongoing   Type of Wheelchair Goal :   Power wheelchair   Mode of Locomotion Goal :    Wheelchair   PT LT Goals Reviewed :   Yes   BENTON, PT, AMBER R - 04/23/2016 13:31 EDT   Plan   Frequency :   Daily   Duration :   3    PT Duration Unit Rehab :   Weeks   Treatments Planned :   Balance training, Basic activities of daily living, Bed mobility training, Caregiver training, Electrical stimulation, Equipment training, Functional training, Manual therapy, Neuromuscular reeducation, Pain management, Posture/Body mechanics training, Patient education, Soft tissue massage/MFR, Therapeutic activities, Therapeutic exercises, Wheelchair assessment and management   Treatment Plan/Goals Established With Patient/Caregiver :   Yes   Other PT Treatment Provided :   car transfer training completedwith husbandand daughther max A SB   Evaluation Complete :   Yes   BENTON, PT, AMBER R - 04/23/2016 13:31 EDT  Time Spent With Patient   PT Time In :   8:30 EST   PT Time Out :   9:30 EST   PT ADL TRAINING 15 MN :   4 units   PT ADL Training Time :   60 minutes   PT Total Individual Therapy Time :   60 minutes   PT Total Timed Code Treatment Units :   4 units   PT Total Timed Code Tx Minutes :   60 minutes   PT Total Treatment Time Rehab :   60 minutes   BENTON, PT, AMBER R - 04/23/2016 13:31 EDT   Section GG: Discharge Mobility Functional Abilities   GG0170 Roll Left and Right :   Partial/Moderate assistance - Helper does LESS THAN HALF the effort. Helper lifts, holds or supports trunk or limbs, but provides less than half the effort. - 03   GG0170 Sit to Lying :   Partial/Moderate assistance - Helper does LESS THAN HALF the effort. Helper lifts, holds or supports trunk or limbs, but provides less than half the effort. - 03   GG0170 Lying to Sitting on Side of Bed :   Substantial/Maximal assistance - Helper does MORE THAN HALF the effort. Helper lifts or holds trunk or limbs and provides more than half the effort. - 02   GG0170 Sit to Stand :   Dependent - Helper does ALL of the effort. Patient/Resident does none of the  effort to complete the activity or the assistance of 2 or more helpers is required for the patient/resident to complete the activity. - 01   GG0170 Chair,Bed to Chair Transfer :   Substantial/Maximal assistance - Helper does MORE THAN HALF the effort. Helper lifts or holds trunk or limbs and provides more than half the effort. - 02   GG0170 Car Transfer :   Dependent - Helper does ALL of the effort. Patient/Resident does none of the effort to complete the activity or the assistance of 2 or more helpers is required for the patient/resident to complete the activity. - 01   GG0170 H3 Patient Walk :   No   BENTON, PT, AMBER R - 04/23/2016 13:31 EDT   Care Tool Section GG: Mobility Continued   GG0170 Patient Use Wheelchair,Scooter :   Yes   GG0170 Wheel 50 Feet with Two Turns :   Independent - Patient/Resident completes the activities by him/herself, with or without an assistive device, with no assistance from a helper. - 06   GG0170 Type Wheelchair,Scooter Use 54ft :   Motorized wheelchair   GG0170 Wheel 150 feet :   Independent - Patient/Resident completes the activities by him/herself, with or without an assistive device, with no assistance from a helper. - 06   GG0170 Type Wheelchair,Scooter Use 137ft :   Motorized wheelchair   [place], PT, AMBER R - 04/23/2016 13:31 EDT
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
## 2016-04-23 NOTE — Progress Notes (Signed)
Functional Indep Measure Scores - Text       Functional Independence Measure Scores Entered On:  04/23/2016 16:14 EDT    Performed On:  04/23/2016 8:30 EDT by Eliseo GumBENTON, PT, AMBER R               Transfer Bed/Chair/WC Score   Patient's independence Level with Bed, Chair, Wheelchair Tasks :   Assistance   Type of Assistance Necessary :   Assistance for more than half (patient performs 25% - 49% of tasks)   Bed, Chair, Wheelchair Transfer :   Maximal assistance   LauderhillBENTON, PT, AMBER R - 04/23/2016 16:14 EDT   Walk/Wheelchair Score   Mode of Locomotion Goal :   Wheelchair   Type of Wheelchair Goal :   Power wheelchair   Mode of Locomotion on Discharge :   Wheelchair   Assessed Locomotion in a Wheelchair :   Yes   Distance Traveled in a Wheelchair :   200 ft   Amount of Assistance Necessary :   No assistance necessary   Modified Independence Qualifiers :   Patient can independently operate a manual or motorized wheelchair for a minimum of 150 feet; turns around; maneuvers the chair to a table, bed, toilet; negotiates at least a 3 percent grade; and maneuvers on rugs and over door sills   Functional Independence Measure Wheelchair :   Modified independence   ReidlandBENTON, PT, AMBER R - 04/23/2016 16:14 EDT

## 2016-04-23 NOTE — Nursing Note (Signed)
Medication Administration Follow Up-Text       Medication Administration Follow Up Entered On:  04/23/2016 15:45 EDT    Performed On:  04/23/2016 15:45 EDT by Shaune PascalPARHAM, RN, Currie ParisKAYLA M      Intervention Information:     acetaminophen  Performed by Dillon BjorkUELL, RN, DEBRA A on 04/23/2016 12:22:00 EDT       acetaminophen,650mg   Oral,mild pain (1-3)       Medication Effectiveness Evaluation   Medication Administration Reason :   Pain   Medication Effective :   Yes   Medication Response :   Symptoms improved   PARHAM, RN, Currie ParisKAYLA M - 04/23/2016 15:45 EDT

## 2016-04-23 NOTE — Progress Notes (Signed)
OT Inpatient Daily Documentation - Text       OT Inpatient Daily Documentation Entered On:  04/23/2016 12:50 EDT    Performed On:  04/23/2016 10:30 EDT by WATERS, OT, MANDI R               Reason for Treatment   *Reason for Referral :   Admitted for MS exacerbation    82 yo female with past medical history of progressive MS diagnosed in 1989, CVA with right sided weakness, hypothyroidism and neurogenic bladder admitted to [facility] in [place], [place] on 03/06/16 with recurrent generalized weakness of the upper and lower extremities and poor posture. She has been wheelchair bound for the past few years due to right-sided weakness.    (per pt, she did not have a CVA)     *Chief Complaint :   weakness     WATERS, OT, MANDI R - 04/23/2016 12:47 EDT   Review/Treatments Provided   OT Goals :   OT Short Term Goals    04/22/2016  Upper Body Dressing Goal #2: Upper extremity dressing; Minimal assistance; Progressing, continue  Lower Body Dressing Goal #1: Lower extremity dressing; Supine, Supported short sit, Unsupported short sit; Max A; Progressing, continue  Bathing Goal #2: Bathe; Mod A; Progressing, continue  Toileting and Transfers Goal #1: Toilet transfers; Max A; Bedside commode; Drop arm commode, Elevated toilet seat, Grab bars, Sliding board; Progressing, continue  Toileting and Transfers Goal #2: Toileting; Max A; Drop arm commode, Elevated toilet seat, Grab bars, Sliding board; Progressing, continue  Balance Goal #1: Unsupported short sit; Static sitting; CGA; 5; Improve independence with activities of daily living; Initial     OT Plan :   Treatment Frequency: Daily Performed By: Carilyn Goodpasture M   03/30/2016  Treatment Duration: 3 Performed By: Katherine Roan   03/30/2016  Planned Treatments: Balance training, Basic Activities of Daily Living, Caregiver training, Coordination, Energy conservation training, Equipment training, Group therapy, Mobility training, Patient education, Safety education,  Therapeutic activities, Therapeutic exercises... Performed By: Katherine Roan   03/30/2016     Occupational Therapy Orders :   Occupational Therapy Medical Hold - 04/18/16 12:00:00 EDT, Stop date 04/18/16 12:00:00 EDT, for appointment at urologist's office this afternoon  OT FIMS - 03/29/16 19:38:00 EDT, Daily     Pain Present :   No actual or suspected pain   OT Therapeutic Exercise :   Yes   WATERS, OT, MANDI R - 04/23/2016 12:47 EDT   Therapeutic Exercise   Therapeutic Exercise RTF :   Therapeutic Exercise  Exercise 1:  Pt pulled 10 items out of yellow theraputty using B hands and increased time.       Performed Date:  04/22/2016  Exercise 2:  Upper extremity strengthening; Gentle resistive exercises for right hand for working on increasing extension.       Performed Date:  04/20/2016     WATERS, OT, MANDI R - 04/23/2016 12:47 EDT   WATERS, OT, MANDI R - 04/23/2016 12:47 EDT   OT Therapeutic Exercise Grid     Exercise 2  Exercise 1        Comments :    Pt completed 3:30 on the Nustep with mod A to increase strength and endurance.    Pt pulled 10 coins out of yellow theraputty using B hands, put in with L hand. Flipped [employer] with R hand and estim for wrist ext to assist. Increased difficulty with R hand. Placed rings  on angled reaching tree leaning forward using B hands with S.             WATERS, OT, MANDI R - 04/23/2016 15:16 EDT  WATERS, OT, MANDI R - 04/23/2016 12:47 EDT        Assessment   OT Impairments or Limitations :   Balance deficits, Basic activity of daily living deficits, Coordination deficits, Endurance deficits, Equipment training, IADL deficits, Mobility deficits, Proprioception deficits, Safety awareness deficits, Strength deficits   Barriers to Safe Discharge OT :   Complicated medical history, Decreased communication, Limited family support, Limited social support, Medical diagnosis, Progressive nature of disease, Safety awareness, Severity of deficits   OT Discharge Recommendations :    ELOS 3 weeks then home with spouse and power w/c     OT Treatment Recommendations :   Pt continues to req skilled OT services to increase strength, endurance, transfers, functional grasp and release, and mobility.      WATERS, OT, MANDI R - 04/23/2016 12:47 EDT   Time Spent With Patient   OT Time In 2 :   14:45 EST   OT Time Out 2 :   15:00 EST   OT Concurrent Therapeutic Exercise Time :   15 minutes   OT Total Concurrent Therapy Time :   15 minutes   WATERS, OT, MANDI R - 04/23/2016 15:16 EDT   OT Time In :   10:30 EST   OT Time Out :   11:30 EST   OT Therapeutic Exercise Units :   4 units   OT Therapeutic Exercise Time :   60 minutes   OT Total Individual Therapy Time :   60 minutes   OT Total Timed Code Treatment Units :   4 units   OT Total Timed Code Treatment Minutes :   60 minutes   OT Total Treatment Time Rehab :   60 minutes   WATERS, OT, MANDI R - 04/23/2016 12:47 EDT

## 2016-04-23 NOTE — Nursing Note (Signed)
Medication Administration Follow Up-Text       Medication Administration Follow Up Entered On:  04/23/2016 23:23 EDT    Performed On:  04/23/2016 23:23 EDT by Milus BanisterOUSE, LPN, Shaune PollackJOANN F      Intervention Information:     lorazepam  Performed by Milus BanisterOUSE, LPN, Shaune PollackJOANN F on 04/23/2016 21:46:00 EDT       lorazepam,1mg   Oral,insomnia       Medication Effectiveness Evaluation   Medication Administration Reason :   Insomnia   Medication Response :   Symptoms improved   ROUSE, LPN, Shaune PollackJOANN F - [id number]/17/2017 23:23 EDT

## 2016-04-23 NOTE — Progress Notes (Signed)
PT Inpatient Daily Documentation - Text       PT Inpatient Daily Documentation Entered On:  04/23/2016 15:29 EDT    Performed On:  04/23/2016 12:30 EDT by Kevan Ny PTA, Melynda Ripple               Reason for Treatment   Subjective Statement :   pt husband and dtr in for fam ed on car tx from manual wc.     *Reason for Referral :   Progressive MS (dx 1989)  new weakness following UTI and C-diff    3 hrs/day     *Chief Complaint :   weakness bilat LE's, tightness R ankle/calf     Christine [name], Christine [name] - 04/23/2016 15:22 EDT   Review/Treatments Provided   PT Goals :   PT Short Term Goals    04/23/2016  Transfer Goal #1: Transfer board; Mod A; Not met     PT Plan :   Treatment Frequency:  Daily (modified)   Performed By: Eliseo Gum, PT, AMBER R  04/23/2016 08:30  Treatment Duration: 3 Performed By: Eliseo Gum, PT, AMBER R  04/23/2016 08:30  Planned Treatments: Balance training, Basic activities of daily living, Bed mobility training, Caregiver training, Electrical stimulation, Equipment training, Functional training, Manual therapy, Neuromuscular reeducation, Pain management, Posture/Body mechanics training,... Performed By: Carney Living, AMBER R  04/23/2016 08:30     Short Term Goals Reviewed :   Yes   Physical Therapy Orders :   Physical Therapy Inpatient Additional Treatment Rehab - 04/23/16 13:39:24 EDT, Balance training, Basic activities of daily living, Bed mobility training, Caregiver training, Electrical stimulation, Equipment training, Functional training, Manual therapy, Neuromuscular reeducation, Pain management, Posture/...  Physical Therapy Medical Hold - 04/18/16 12:00:00 EDT, Stop date 04/18/16 12:00:00 EDT, for appt. at urologist's office this afternoon  PT FIMS - 03/29/16 19:38:00 EDT, Daily     Pain Present :   No actual or suspected pain   PT Therapeutic Activity,Mobility,Balance :   Yes   Alger Memos - 04/23/2016 15:22 EDT   Therapeutic Activities/Mobility/Balance   Functional Activity :   Therapeutic  Activities  Activity 1:  Transfer training; Mod A, Max A; Sliding board; Pt transfered PWC<>Nustep c maxA for lifting and PWC<>mat x2 (1x level surface, 1x unlevel surface) c modA at level surface and maxA at uneven surface c totalA to place transfer board c pt assisting c lateral weight shift and lift LE c B UE. Pt required       Performed Date:  04/22/2016  Activity 2:  assist 2* to dec strength, ROM, and balance       Performed Date:  04/22/2016  Activity 3:  Functional mobility; Close S, Minimal assistance; Pt short sit at edge of bed x11min for balance/strengthening. Multiple trials midline ot R/L sideprop on elbow c up to minA at tricep for inc ext to obtain midline due to weakness. ball tossing in midline and slightly outside BOS to R/L c 2nd person guard       Performed Date:  04/22/2016  Activity 4:  posteriorly and max tacitle cues for upright posture and ant pevlic tilit, trunk rotation c ball in B UE c OP at end range for inc ROM       Performed Date:  04/22/2016     Alger Memos - 04/23/2016 15:22 EDT   PT Therapeutic Activities Grid     Activity 1  Activity 2        Activity :  Transfer training              Assist :    Total assistance              Equipment :    Sliding board, Other: car              Response :    Demonstrated positive response to treatment, Tolerated well              Comment :    pt Dtr and husband in requsting training on car tx via slide board from standard wc. pt max assit for tx, pt fam ed on total assist tx due to inc sfaey c use of 2 people. pt dtr adn husband both performed tx in <>out of car. pt's fam ed on hand placment ,   head hip relatioinship, safey c set up , sequence of movment and problem solving safey concerns.             Alger Memos - 04/23/2016 15:22 EDT  Christine [name], PTA, Melynda Ripple - 04/23/2016 15:22 EDT        Reassess Mobility :   Yes   Christine [name], PTA, Melynda Ripple - 04/23/2016 15:22 EDT   PT Mobility   Mobility Grid   Roll Left :   Rehab Moderate  assistance   Roll Right :   Rehab Moderate assistance   Roll Prone :   Rehab Maximal assistance   Roll Supine :   Rehab Maximal assistance   Supine to Sit :   Rehab Maximal assistance   Sit to Supine :   Rehab Moderate assistance   Scooting :   Rehab Maximal assistance   Transfer Bed to and From Chair :   Rehab Maximal assistance   Transfer Toilet :   Total assistance   Car Transfer :   Rehab Total assistance   Alger Memos - 04/23/2016 15:22 EDT   Functional Mobility Details :   Alger Memos - 04/23/2016 15:22 EDT   Amb Ability Varied Surf/Distraction Grid   Level Surfaces :   Does not occur   Uneven Surfaces :   Does not occur   Distracting Environments :   Does not occur   Curbs :   Does not occur   Stairs :   Does not occur   Ramp :   Does not occur   Alger Memos - 04/23/2016 15:22 EDT   Transfer Type :   Transfer board   PT Mobility Reviewed :   Yes   Alger Memos - 04/23/2016 15:22 EDT   Short Term Goals   Bed Mobility Goal Grid     Goal #1          Descriptors :    Roll to right and left              Level :    Moderate assistance              Status :    Goal met                Alger Memos - 04/23/2016 15:22 EDT         Transfers Goal Grid     Goal #1          Descriptors :    Fish farm manager  Level :    Moderate assistance              Status :    Not met                Alger Memos - 04/23/2016 15:22 EDT         W/C Management Grid     Goal #1  Goal #2  Goal #3  Goal #4    Descriptors :    Power wheelchair mobility indoors   Pressure relief tilt in space (power wheelchair)   Power wheelchair mobility indoors   Pressure relief tilt in space (power wheelchair)     Level :    Minimal assistance   Minimal assistance   Setup, Distant supervision   Modified independence     Status :    Goal met   Goal met   Goal met   Goal met     Date Met :    04/06/2016 EDT   04/06/2016 EDT   04/14/2016 EDT   04/14/2016 EDT       Christine [name], PTA, Alesia Banda M - 04/23/2016  15:22 EDT  Christine [name], PTA, Melynda Ripple - 04/23/2016 15:22 EDT  Christine [name], PTA, Melynda Ripple - 04/23/2016 15:22 EDT  Christine [name], PTA, Melynda Ripple - 04/23/2016 15:22 EDT      PT Balance Goal Grid     Goal #1          Descriptor :    Unsupported short sit              Assist Level :    Moderate assistance              Type :    Static sitting              Length of Time (minutes) :    2 minutes              Rationale :    Improve independence with activities of daily living              Status :    Goal met                Christine [name], Christine [name] - 04/23/2016 15:22 EDT         PT ST Goals Reviewed :   Yes   Christine [name], PTA, Melynda Ripple - 04/23/2016 15:22 EDT   Assessment   PT Impairments or Limitations :   Abnormal tone, Balance deficits, Bed mobility deficits, Coordination/Proprioception deficits, Endurance deficits, Equipment training, Impaired sensation, Pain limiting function, Range of motion deficits, Strength deficits, Transfer deficits, Transition deficits, Wheelchair mobility deficits   Barriers to Safe Discharge PT :   Medical diagnosis   Discharge Recommendations :   ELOS: 3 weeks; home with assist and PWC     PT Treatment Recommendations :   pt drt and husband able to perform car tx from standard wc c vc for body mechanics and safey for pt and fam, Tx performed at total assist level. pt will cont to benfit form on going ed and therapy after DC     Christine [name], PTA, Melynda Ripple - 04/23/2016 15:22 EDT   Time Spent With Patient   PT Time In :   12:30 EST   PT Time Out :   13:15 EST   PT ADL TRAINING 15 MN :   3 units   PT ADL Training Time :   45 minutes  PT Total Individual Therapy Time :   45 minutes   PT Total Timed Code Treatment Units :   3 units   PT Total Timed Code Tx Minutes :   45 minutes   PT Total Treatment Time Rehab :   45 minutes   Christine [name], PTA, Melynda RippleLEANNE M - 04/23/2016 15:22 EDT

## 2016-04-23 NOTE — Progress Notes (Signed)
Functional Indep Measure Scores - Text       Functional Independence Measure Scores Entered On:  04/23/2016 12:46 EDT    Performed On:  04/23/2016 10:30 EDT by WATERS, OT, MANDI R               Comprehension Score   Mode of Comprehension :   Auditory   Comprehends Complex or Abstract Information Without Prompting or Cueing :   Yes   Understands Complex or Abstract Directions and Conversations :   At all times   Comprehension Indep Measure Interim :   Complete independence   WATERS, OT, MANDI R - 04/23/2016 12:46 EDT   Expression Score   Expression Mode :   Vocal   Expresses Complex or Abstract Information Without Prompting or Cueing :   Yes   Expresses Complex or Abstract Ideas :   Clearly and fluently at all times   Expression Indep Measure Interim :   Complete independence   WATERS, OT, MANDI R - 04/23/2016 12:46 EDT   Social Interaction Score   Interacts Appropriately Without Supervision :   Yes   Interacts Appropriately :   At all times   Social Interaction Indep Measure Interim :   Complete independence   WATERS, OT, MANDI R - 04/23/2016 12:46 EDT   Problem Solving Score   Solves Complex Problems :   Yes   Ability to Solve Complex Problems :   Consistently solves problems independently   Problem Solving Indep Measure Interim :   Complete independence   WATERS, OT, MANDI R - 04/23/2016 12:46 EDT   Memory Score   Recognizes, Remembers Routines, and Executes Requests Without Prompting :   Yes   Remembers and Executes Requests :   Consistently without need for repetition   Memory Indep Measure Interim :   Complete independence   WATERS, OT, MANDI R - 04/23/2016 12:46 EDT

## 2016-04-23 NOTE — Case Communication (Signed)
 CM Discharge Planning Assessment - Text       CM Discharge Planning Ongoing Assessment Entered On:  04/23/2016 17:48 EDT    Performed On:  04/23/2016 17:42 EDT by GREG LLANOS C               Discharge Needs I   Previously Documented Discharge Needs :   DISCHARGE PLAN/NEEDS:  Anticipated Discharge Date: 04/17/2016 - GREG LLANOS BROCKS - 04/16/16 09:46:00  Discharge To, Anticipated: Home with family support - GREG LLANOS BROCKS - 04/16/16 09:46:00  EQUIPMENT/TREATMENT NEEDS:       Previously Documented Benefits Information :   Performed By: Karen Janann NOVAK  - 03/28/16 11:44:00       Anticipated Discharge Date :   04/17/2016 EDT   Anticipated Discharge Time Slot :   1000-1200   Discharge To :   Home with family support   Home Caregiver Name/Relationship :   Alfred-spouse   CM Progress Note :   04/01/2016  SW went to speak with Pt several times this day, but she was always indisposed - SW able to meet 1:1 with spouse.  SW presented the [facility]- Summary.  Pt's spouse does not feel the anticipated D/C date will be met, as far as, Pt making proposed goals.  Spouse would like to see Pt make it to as close to [place] with transfers as possible.  Additionally, Spouse would like to hold off presenting D/C date, due to potential of upsetting Pt.  Pt seems to be struggling with loss of independence and ability.  Please note that Pt has been in/out of hospitals since June.  Next, she has not gotten therapy in the past 3-4 weeks.  Spouse concerned about endurance and muscle loss.  Spouse requested assistance with release of information from Dr. Arlyss in Urologist in [place]  --> send clinical(s) to Dr. Dewane with [facility] (faxed releases).  SW will continue to assist/prn.  vcd     04/08/2016  SW went to relay ITC-Summary to Pt. Pt requested that I return when she has her husbanc.  SW will follow up & continue to assist/prn.     04/09/2016  SW went to meet with Pt & her spouse.  Family would like to explore  sub-acute facilities that have a community with level of services & one that has a golf course close by for spouse.  SW will put together local regressive/progressive communities in the [place] area.  SW will continue to assist/prn.  vcd    04/11/2016  SW was informed by Dr. Stanly that Dr. Brendan will need to see Pt in his office.  SW called Dr. Brendan - Urology:  [facility] [phone]) and spoke with Dorthea who recieved info, but relayed that she would have [place], RN call SW back.  SW will continue to assist/prn. vcd    04/15/2016  @ 12:05 SW called Dr. Jennine' office this day to inquire about appointment.  Was informed that Pt has appt on 10/12 @ 1:45, then @ 2:15 for the Dynamic with the PA.  SW will relay info to MD & Pt/spouse & assist with transport.  vcd    04/15/2016  SW met with Pt & spouse to relay ITC-Summary and time/date for urology appt.  SW will need to set up transport with PWC, otherwise, she will not be able to move through the office.  D/C date as changed to 10/18.  Pt's spouse has been visiting facilities on  the list provided, but still has not made a decision.  Pt stated that she would cross that bridge later. Pt is wanting to concentrate on going to Dr. Lawanda office to get a plan for her bowel/bladder function(s).  Next TC will be held on 10/16.  SW will continue to assist.      04/16/2016 @9 :30a SW called this morning to [facility] to inquire about transport via PWC to MD's office.  Jason with [facility] relayed that there is only one tranport co. in the area that would assist with PWC's.  SW faxed info this day as to ensure transport for 04/17/2016  SW will follow up & continue to assist.  vcd     10/16 & 17/2017  SW has met with Pt and her spouse several times in the last two days.  Pt & family has decided on [facility].  During this time, [facility] was concerned about cost of Pt's MS medication.  SW was able to arrange for [facility] rep to meet with Pt & spouse.  All arrangements complete for  transfer on 04/24/2016.  Next [facility] will transfer power wc to [facility].  MD completed order for chest x-ray to r/o TB for sub-acute placement.  SW will make transport arrangements via stretcher.  SW will continue to assist/prn.  vcd             GREG SYLIVA BROCKS - 04/23/2016 17:42 EDT   Discharge Planning   Discharge Arrangements :       Patient Post-Acute Information    Patient Name: Christine [name], Christine [name]  MRN: [id number]  FIN: [id number]  Gender: Female  DOB: [birth year]  Age:  82 Years        *** No Post-Acute Placement(s) Listed ***          *** No Post-Acute Service(s) Listed [facility] Services :   [facility] [phone]) for continued skilled nursing and therapy services  [facility] [phone]) for Power Wheelchair     GREG SYLIVA BROCKS - 04/23/2016 17:42 EDT   Readmission Report   Readmission Report :   High   Is the patient age 80 or older :   Yes   Clinically complex :   Multiple co-morbidities   GREG SYLIVA BROCKS - 04/23/2016 17:42 EDT

## 2016-04-24 NOTE — Progress Notes (Signed)
PT Inpatient Discharge Exam -Text       PT Inpatient Discharge Exam Entered On:  04/24/2016 12:22 EDT    Performed On:  04/24/2016 12:14 EDT by Marisa Sprinkles, PT, Magdalene Molly               Reason for Treatment   *Reason for Referral :   Progressive MS (dx 1989)  new weakness following UTI and C-diff    3 hrs/day     *Chief Complaint :   weakness bilat LE's, tightness R ankle/calf     Marisa Sprinkles, PT, Manuel Garcia C - 04/24/2016 12:14 EDT   Sitting Balance   Static Sitting Balance Assessment Grid   Sits Without UE Support :   Setup   Sits With One UE Support :   Setup   Sits With Two UE Support :   Nolberto Hanlon, PT, Kaitlin C - 04/24/2016 12:14 EDT   Sitting Surface Evaluated Upon :   Mat   Patient Accepts Perturbations :   Yes   Marisa Sprinkles, PT, Kaitlin C - 04/24/2016 12:14 EDT   Dynamic Sitting Balance Assessment Grid   Anterior Shift :   Able   Posterior Shift :   Able   Lateral to the Left Shift :   Able   Lateral to the Right Shift :   [place] Ku, PT, Kaitlin C - 04/24/2016 12:14 EDT   Righting Reactions Grid   Left Protective Reactions :   Delayed   Right Protective Reactions :   Delayed   Left Righting Reactions :   Delayed   Right Righting Reactions :   Delayed   Marisa Sprinkles, PT, Kaitlin C - 04/24/2016 12:14 EDT   Standing Balance   Static Standing Balance   Stands Without UE Support :   Does not occur   Stands with One UE Support :   Does not occur   Stands with Two UE Support :   Does not occur   [place], PT, [place] C - 04/24/2016 12:14 EDT   PT Mobility   Mobility Grid   Roll Left :   Rehab Moderate assistance   Roll Right :   Rehab Moderate assistance   Roll Prone :   Rehab Maximal assistance   Roll Supine :   Rehab Maximal assistance   Supine to Sit :   Rehab Maximal assistance   Sit to Supine :   Rehab Moderate assistance   Scooting :   Rehab Maximal assistance   Transfer Bed to and From Chair :   Rehab Maximal assistance   Transfer Toilet :   Total assistance   Car Transfer :   Rehab Total assistance   Maude Leriche -  04/24/2016 12:14 EDT   Functional Mobility Details :   .   Marisa Sprinkles, PT, Wal[place] Laine C - 04/24/2016 12:14 EDT   Amb Ability Varied Surf/Distraction Grid   Level Surfaces :   Does not occur   Uneven Surfaces :   Does not occur   Distracting Environments :   Does not occur   Curbs :   Does not occur   Stairs :   Does not occur   Ramp :   Does not occur   Marisa Sprinkles, PT, [place] C - 04/24/2016 12:14 EDT   Transfer Type :   Transfer board   PT Mobility Reviewed :   Yes   Marisa Sprinkles, PT, Kaitlin C - 04/24/2016 12:14 EDT   PT WC Management   Type  of Wheelchair :   Power wheelchair   Wheelchair Details :   .   Maude Leriche - 04/24/2016 12:14 EDT   Wheelchair Mobility Grid   Level Surfaces :   Rehab Modified independence   Maude Leriche - 04/24/2016 12:14 EDT   Wheelchair Mobility Level Distance :   300 ft   [place], [place], [place] C - 04/24/2016 12:14 EDT   Pressure Relief Grid     Wheelchair Pressure Relief Trial 1          Technique :    Tilt in space, power              Assist :    Setup              Duration (Seconds) :    120 seconds                Maude Leriche - 04/24/2016 12:14 EDT         Wheelchair Mobility Reviewed :   Yes   Marisa Sprinkles, PT, Magdalene Molly - 04/24/2016 12:14 EDT   Education   Home Caregiver Name/Relationship :   Ilda Basset, PTMagdalene Molly - 04/24/2016 12:14 EDT   Physical Therapy Education Grid   Exercise Program :   [employer] understanding, Demonstrates, Needs further teaching, Needs practice/supervision   [place], PT, [place] C - 04/24/2016 12:14 EDT   Assessment   PT Impairments or Limitations :   Abnormal tone, Balance deficits, Bed mobility deficits, Coordination/Proprioception deficits, Endurance deficits, Equipment training, Impaired sensation, Pain limiting function, Range of motion deficits, Strength deficits, Transfer deficits, Transition deficits, Wheelchair mobility deficits   Barriers to Safe Discharge PT :   Medical diagnosis   Discharge Recommendations :   ELOS: 3 weeks; home with  assist and PWC     PT Treatment Recommendations :   Pt has made improvements in static and dynamic sitting balance accepting perturbations without UE support, able to reach for objects  on the floor and facilitate slideboard transfer wtih full trunk flexion to decrease burden of care upon d/c to home with elderly husband.  Pt to use power w/c for mobility at mod I level.      Marisa Sprinkles, PT, [place] C - 04/24/2016 12:14 EDT   Short Term Goals   Bed Mobility Goal Grid     Goal #1          Descriptors :    Roll to right and left              Level :    Moderate assistance              Status :    Goal met                Maude Leriche - 04/24/2016 12:14 EDT         Transfers Goal Grid     Goal #1          Descriptors :    Transfer board              Level :    Moderate assistance              Status :    Not met                Maude Leriche - 04/24/2016 12:14 EDT         W/C Management Grid  Goal #1  Goal #2  Goal #3  Goal #4    Descriptors :    Power wheelchair mobility indoors   Pressure relief tilt in space (power wheelchair)   Power wheelchair mobility indoors   Pressure relief tilt in space (power wheelchair)     Level :    Minimal assistance   Minimal assistance   Setup, Distant supervision   Modified independence     Status :    Goal met   Goal met   Goal met   Goal met     Date Met :    04/06/2016 EDT   04/06/2016 EDT   04/14/2016 EDT   04/14/2016 EDT       Marisa Sprinkles, PT, Kaitlin C - 04/24/2016 12:14 EDT  Marisa Sprinkles, PT, Kaitlin C - 04/24/2016 12:14 EDT  Marisa Sprinkles, PT, Kaitlin C - 04/24/2016 12:14 EDT  Marisa Sprinkles, PT, Kaitlin C - 04/24/2016 12:14 EDT      PT Balance Goal Grid     Goal #1          Descriptor :    Unsupported short sit              Assist Level :    Moderate assistance              Type :    Static sitting              Length of Time (minutes) :    2 minutes              Rationale :    Improve independence with activities of daily living              Status :    Goal met                Marisa Sprinkles, PT, Magdalene Molly  - 04/24/2016 12:14 EDT         PT ST Goals Reviewed :   Yes   Marisa Sprinkles, PT, Magdalene Molly - 04/24/2016 12:14 EDT   Long Term Goals   PT Patient,Caregiver Goal :   "To get back to being independent"   Marisa Sprinkles, [place], Magdalene Molly - 04/24/2016 12:14 EDT   Outpatient PT Long Term Goals Rehab     Long Term Goal 1          Goal :    Pt will complete car transfer with mod A in 3 weeks              Status :    Not met                Maude Leriche - 04/24/2016 12:14 EDT         Mobility Goals Grid   Bed, Chair, Wheelchair Goal :   Minimal contact assistance   (Comment: not met, modA [[place], PT, Kaitlin C - 04/24/2016 12:14 EDT] )   Wheelchair Mobility Level Surfaces Goal :   Modified independence   (Comment: met [[place], PT, Kaitlin C - 04/24/2016 12:14 EDT] )   Marisa Sprinkles, PT, Kaitlin C - 04/24/2016 12:14 EDT   PT LTG Reconcilation :   LTG to be met in 3 weeks  LTG ongoing   Type of Wheelchair Goal :   Power wheelchair   Mode of Locomotion Goal :   Wheelchair   PT LT Goals Reviewed :   Yes   Marisa Sprinkles, PT, Magdalene Molly - 04/24/2016 12:14 EDT   Plan  Frequency :   Daily   Duration :   3    PT Duration Unit Rehab :   Weeks   Treatments Planned :   Balance training, Basic activities of daily living, Bed mobility training, Caregiver training, Electrical stimulation, Equipment training, Functional training, Manual therapy, Neuromuscular reeducation, Pain management, Posture/Body mechanics training, Patient education, Soft tissue massage/MFR, Therapeutic activities, Therapeutic exercises, Wheelchair assessment and management   Treatment Plan/Goals Established With Patient/Caregiver :   Yes   Evaluation Complete :   Yes   Marisa Sprinkles, PT, Magdalene Molly - 04/24/2016 12:14 EDT   Time Spent With Patient   PT Time In :   9:00 EST   PT Time Out :   10:30 EST   PT Therapeutic Exercise Units :   3 units   PT Therapeutic Exercise Time :   45 minutes   PT ADL TRAINING 15 MN :   1 units   PT ADL Training Time :   15 minutes   PT NEUROMUSC RE-EDUC 15 MIN :   2 units    PT Neuromuscular Reeducation Time :   30 minutes   PT Total Individual Therapy Time :   90 minutes   PT Total Timed Code Treatment Units :   6 units   PT Total Timed Code Tx Minutes :   90 minutes   PT Total Treatment Time Rehab :   90 minutes   Marisa Sprinkles, PT, [place] C - 04/24/2016 12:14 EDT   Section GG: Discharge Mobility Functional Abilities   GG0170 Roll Left and Right :   Partial/Moderate assistance - Helper does LESS THAN HALF the effort. Helper lifts, holds or supports trunk or limbs, but provides less than half the effort. - 03   GG0170 Sit to Lying :   Partial/Moderate assistance - Helper does LESS THAN HALF the effort. Helper lifts, holds or supports trunk or limbs, but provides less than half the effort. - 03   GG0170 Lying to Sitting on Side of Bed :   Substantial/Maximal assistance - Helper does MORE THAN HALF the effort. Helper lifts or holds trunk or limbs and provides more than half the effort. - 02   GG0170 Sit to Stand :   Not applicable - 09   GG0170 Chair,Bed to Chair Transfer :   Substantial/Maximal assistance - Helper does MORE THAN HALF the effort. Helper lifts or holds trunk or limbs and provides more than half the effort. - 02   GG0170 Toilet Transfer :   Substantial/Maximal assistance - Helper does MORE THAN HALF the effort. Helper lifts or holds trunk or limbs and provides more than half the effort. - 02   GG0170 Car Transfer :   Substantial/Maximal assistance - Helper does MORE THAN HALF the effort. Helper lifts or holds trunk or limbs and provides more than half the effort. - 02   GG0170 H3 Patient Walk :   No   Maude Leriche - 04/24/2016 12:14 EDT   Care Tool Section GG: Mobility Continued   GG0170 Patient Use Wheelchair,Scooter :   Yes   GG0170 Wheel 50 Feet with Two Turns :   Independent - Patient/Resident completes the activities by him/herself, with or without an assistive device, with no assistance from a helper. - 06   GG0170 Type Wheelchair,Scooter Use 13ft :   Motorized  wheelchair   GG0170 Wheel 150 feet :   Independent - Patient/Resident completes the activities by him/herself, with or without an assistive device, with  no assistance from a helper. - 06   GG0170 Type Wheelchair,Scooter Use 111ft :   Motorized wheelchair   [place], PT, [place] C - 04/24/2016 12:14 EDT

## 2016-04-24 NOTE — Case Communication (Signed)
Care Management Discharge Plan - Text       Care Management Discharge Plan Entered On:  04/24/2016 10:03 EDT    Performed On:  04/24/2016 10:03 EDT by Jerl MinaEANTONIO,  VELIA C               Care Management Discharge Plan   Care Management Discharge Planning Note :   [facility] [phone]) for continued skilled nursing and therapy services  [facility] [phone]) for Power Wheelchair   CM Barriers to Safe Discharge :   Complicated medical history, Medical diagnosis, Progressive nature of disease   Julien GirtDEANTONIO,  VELIA C - 04/24/2016 10:03 EDT   Discharge IRF-PAI   Discharge to Living Setting :   Skilled nursing facility   Julien GirtDEANTONIO,  VELIA C - 04/24/2016 10:03 EDT

## 2016-04-24 NOTE — Progress Notes (Signed)
Functional Indep Measure Scores - Text       Functional Independence Measure Scores Entered On:  04/24/2016 8:35 EDT    Performed On:  04/24/2016 7:30 EDT by WATERS, OT, MANDI R               Eating Score   Patient's Independence Level With Eating Tasks :   Independent/Modified independence   Independent or Modifications Needed :   Complete independence   Functional Independence Measure Eating :   Complete independence   WATERS, OT, MANDI R - 04/24/2016 8:28 EDT   Grooming Score   Patient's Independence Level with Grooming Tasks :   Independent/Modified independence   InM Grooming Indep or Modifications :   Needs more than reasonable time   Grooming :   Modified independence   WATERS, OT, MANDI R - 04/24/2016 8:28 EDT   Bathing Score   Patient's Independence Level with Bathing Tasks :   Assistance   Type of Assistance Necessary :   Assistance with bathing body parts   Body Parts Assessed :   All body surfaces   Tasks Requiring Assistance :   Buttocks   Functional Independence Measure Bathing :   Minimal contact assistance   WATERS, OT, MANDI R - 04/24/2016 8:28 EDT   Upper Body Dressing Score   Patient's Independence Level with Upper Body Dressing Tasks :   Assistance   Type of Assistance Necessary :   Assistance with dressing tasks   Tasks Assessed :   Thread/Unthread right sleeve, Thread/Unthread left sleeve, Pull/Remove head through neckline, Pull/Remove over trunk, Hook/Unhook bra, Thread/Unthread right bra strap, Thread/Unthread left bra strap   Tasks Requiring Assistance :   Pull/Remove over trunk, Hook/Unhook bra   UE Dressing :   Moderate assistance   WATERS, OT, MANDI R - 04/24/2016 8:28 EDT   Lower Body Dressing Score   Patient's independence Level with Lower Body Dressing Tasks :   Assistance   Type of Assistance Necessary :   Assistance with dressing tasks   Tasks Assessed :   All dressing tasks   Tasks Requiring Assistance :   All dressing tasks   LE Dressing :   Total assistance   WATERS, OT, MANDI R -  04/24/2016 8:28 EDT   Toileting Score   Patient's Independence Level with Toileting Tasks :   Assistance   Type of Assistance Necessary :   Assistance with toileting tasks   Amount of Assistance Needed :   Adjusting clothing before toileting, Adjusting clothing after toileting, Cleansing perineal area   Toileting :   Total assistance   WATERS, OT, MANDI R - 04/24/2016 8:28 EDT   Transfer Bed/Chair/WC Score   Patient's independence Level with Bed, Chair, Wheelchair Tasks :   Assistance   Type of Assistance Necessary :   Total assist (patient performs less than 25%)   Bed, Chair, Wheelchair Transfer :   Total assistance   WATERS, OT, MANDI R - 04/24/2016 8:28 EDT   Transfer Toilet Score   Patient's Independence Level with Transfer Toilet Tasks :   Assistance   Amount of Assistance Necessary :   Total assist (patient performs less than 25%)   Transfer Toilet :   Total assistance   WATERS, OT, MANDI R - 04/24/2016 8:28 EDT   Transfer Shower Score   Patient's independence Level with Transfer Shower Tasks :   Assistance   Type of Assistance Necessary :   Total assist (patient performs less than 25%)  Shower Transfer :   Total assistance   WATERS, OT, MANDI R - 04/24/2016 8:28 EDT   Comprehension Score   Mode of Comprehension :   Auditory   Comprehends Complex or Abstract Information Without Prompting or Cueing :   Yes   Understands Complex or Abstract Directions and Conversations :   At all times   Comprehension Indep Measure Interim :   Complete independence   WATERS, OT, MANDI R - 04/24/2016 8:28 EDT   Expression Score   Expression Mode :   Vocal   Expresses Complex or Abstract Information Without Prompting or Cueing :   Yes   Expresses Complex or Abstract Ideas :   Clearly and fluently at all times   Expression Indep Measure Interim :   Complete independence   WATERS, OT, MANDI R - 04/24/2016 8:28 EDT   Social Interaction Score   Interacts Appropriately Without Supervision :   Yes   Interacts Appropriately :   At all  times   Social Interaction Indep Measure Interim :   Complete independence   WATERS, OT, MANDI R - 04/24/2016 8:28 EDT   Problem Solving Score   Solves Complex Problems :   Yes   Ability to Solve Complex Problems :   Consistently solves problems independently   Problem Solving Indep Measure Interim :   Complete independence   WATERS, OT, MANDI R - 04/24/2016 8:28 EDT   Memory Score   Recognizes, Remembers Routines, and Executes Requests Without Prompting :   Yes   Remembers and Executes Requests :   Consistently without need for repetition   Memory Indep Measure Interim :   Complete independence   WATERS, OT, MANDI R - 04/24/2016 8:28 EDT

## 2016-04-24 NOTE — Case Communication (Signed)
 CM Discharge Planning Assessment - Text       CM Discharge Planning Ongoing Assessment Entered On:  04/24/2016 10:02 EDT    Performed On:  04/24/2016 10:01 EDT by GREG LLANOS [name]               Discharge Needs I   Previously Documented Discharge Needs :   DISCHARGE PLAN/NEEDS:  Anticipated Discharge Date: 04/17/2016 - GREG LLANOS BROCKS - 04/23/16 17:42:00  Discharge To, Anticipated: Home with family support - GREG LLANOS BROCKS - 04/23/16 17:42:00  EQUIPMENT/TREATMENT NEEDS:       Previously Documented Benefits Information :   Performed By: Karen Janann NOVAK  - 03/28/16 11:44:00       Anticipated Discharge Date :   04/17/2016 EDT   Anticipated Discharge Time Slot :   1000-1200   Discharge To :   Home with family support   Home Caregiver Name/Relationship :   Alfred-spouse   CM Progress Note :   04/01/2016  SW went to speak with Pt several times this day, but she was always indisposed - SW able to meet 1:1 with spouse.  SW presented the [facility]- Summary.  Pt's spouse does not feel the anticipated D/[name] date will be met, as far as, Pt making proposed goals.  Spouse would like to see Pt make it to as close to Mod [place] with transfers as possible.  Additionally, Spouse would like to hold off presenting D/[name] date, due to potential of upsetting Pt.  Pt seems to be struggling with loss of independence and ability.  Please note that Pt has been in/out of hospitals since June.  Next, she has not gotten therapy in the past 3-4 weeks.  Spouse concerned about endurance and muscle loss.  Spouse requested assistance with release of information from Dr. Arlyss in Urologist in [place]  --> send clinical(s) to Dr. Dewane with [facility] (faxed releases).  SW will continue to assist/prn.  vcd     04/08/2016  SW went to relay ITC-Summary to Pt. Pt requested that I return when she has her husbanc.  SW will follow up & continue to assist/prn.     04/09/2016  SW went to meet with Pt & her spouse.  Family would like to explore  sub-acute facilities that have a community with level of services & one that has a golf course close by for spouse.  SW will put together local regressive/progressive communities in the [place] area.  SW will continue to assist/prn.  vcd    04/11/2016  SW was informed by Dr. Stanly that Dr. Brendan will need to see Pt in his office.  SW called Dr. Brendan - Urology:  [facility] [phone]) and spoke with [place] who recieved info, but relayed that she would have [place], RN call SW back.  SW will continue to assist/prn. vcd    04/15/2016  @ 12:05 SW called Dr. Jennine' office this day to inquire about appointment.  Was informed that Pt has appt on 10/12 @ 1:45, then @ 2:15 for the Dynamic with the PA.  SW will relay info to MD & Pt/spouse & assist with transport.  vcd    04/15/2016  SW met with Pt & spouse to relay ITC-Summary and time/date for urology appt.  SW will need to set up transport with PWC, otherwise, she will not be able to move through the office.  D/[name] date as changed to 10/18.  Pt's spouse has been visiting facilities on  the list provided, but still has not made a decision.  Pt stated that she would cross that bridge later. Pt is wanting to concentrate on going to Dr. Lawanda office to get a plan for her bowel/bladder function(s).  Next TC will be held on 10/16.  SW will continue to assist.      04/16/2016 @9 :30a SW called this morning to [facility] to inquire about transport via PWC to MD's office.  Jason with [facility] relayed that there is only one tranport co. in the area that would assist with PWC's.  SW faxed info this day as to ensure transport for 04/17/2016  SW will follow up & continue to assist.  vcd     10/16 & 17/2017  SW has met with Pt and her spouse several times in the last two days.  Pt & family has decided on [facility].  During this time, HWA was concerned about cost of Pt's MS medication.  SW was able to arrange for [facility] rep to meet with Pt & spouse.  All arrangements complete for  transfer on 04/24/2016.  Next [facility] will transfer power wc to [facility].  MD completed order for chest x-ray to r/o TB for sub-acute placement.  SW will make transport arrangements via stretcher.  SW will continue to assist/prn.  vcd             GREG SYLIVA BROCKS - 04/24/2016 10:01 EDT   Discharge Planning   Discharge Arrangements :       Patient Post-Acute Information    Patient Name: Christine [name], Christine [name]  MRN: [id number]  FIN: [id number]  Gender: Female  DOB: [birth year]  Age:  82 Years        *** No Post-Acute Placement(s) Listed ***          *** No Post-Acute Service(s) Listed [facility] Services :   [facility] [phone]) for continued skilled nursing and therapy services  [facility] [phone]) for Power Wheelchair     Discharge Options Discussed with Patient :   Nursing home   Interventions Performed :   Other: Chest x-ray   Discharge Plan Discussion :   Discussed with patient, Discussed with family/caregiver, Patient agrees with plan, Family agrees with plan   Discharge Medicare Message Date/Time :   04/22/2016 10:04 EDT   GREG SYLIVA [name] - 04/24/2016 10:01 EDT   Readmission Report   Readmission Report :   High   Is the patient age 36 or older :   Yes   Clinically complex :   Multiple co-morbidities   DEANTONIO,  Christine [name] - 04/24/2016 10:01 EDT

## 2016-04-24 NOTE — Care Plan (Signed)
 ED Christine [name]     Patient Education Materials Follows:

## 2016-04-24 NOTE — Progress Notes (Signed)
Functional Indep Measure Scores - Text       Functional Independence Measure Scores Entered On:  04/26/2016 14:51 EDT    Performed On:  04/24/2016 4:30 EDT by Marisa SprinklesKRAFT, RN, ANN MARIE D               Bladder Management Score   Patient's Independence Level with Bladder Management Tasks :   Assistance   Type of Assistance Necessary :   Helper positions and holds bedpan, assists patient to roll on AND off the bedpan, Helper changes linen or clothing/cleans up spill, Two helpers needed   Functional Independence Measure Bladder Management :   Total assistance   Bladder Management Score Comments :   info per Nelida MeuseJ Rouse, LPN   KRAFT, RN, ANN MARIE D - 04/26/2016 14:49 EDT   Bowel Management Score   Patient's Independence Level with Bowel Management Tasks :   Assistance   Type of Assistance Necessary :   Helper positions and holds bedpan, assists patient to roll on AND off the bedpan, Helper changes linen or clothing/cleans up spill, Two helpers needed   Functional Independence Measure Bowel Management :   Total assistance   Bowel Management Score Comments :   info per Nelida MeuseJ Rouse, LPN   KRAFT, RN, ANN MARIE D - 04/26/2016 14:49 EDT   Transfer Bed/Chair/WC Score   Patient's independence Level with Bed, Chair, Wheelchair Tasks :   Assistance   Type of Assistance Necessary :   Lifting and lowering patient, Two helpers needed, Total assist (patient performs less than 25%)   Bed, Chair, Wheelchair Transfer :   Total assistance   Bed/Chair/Wheelchair Score Comments :   info per Nelida MeuseJ Rouse, LPN   KRAFT, RN, ANN MARIE D - 04/26/2016 14:49 EDT

## 2016-04-24 NOTE — Care Plan (Signed)
 ED Lita Mains     Patient Education Materials Follows:

## 2016-04-24 NOTE — Case Communication (Signed)
 CM Discharge Planning Assessment - Text       CM Discharge Planning Ongoing Assessment Entered On:  04/24/2016 10:03 EDT    Performed On:  04/24/2016 10:02 EDT by GREG LLANOS C               Discharge Needs I   Previously Documented Discharge Needs :   DISCHARGE PLAN/NEEDS:  Anticipated Discharge Date: 04/17/2016 - GREG LLANOS BROCKS - 04/24/16 10:01:00  Discharge To, Anticipated: Home with family support - GREG LLANOS BROCKS - 04/24/16 10:01:00  EQUIPMENT/TREATMENT NEEDS:       Previously Documented Benefits Information :   Performed By: Karen Janann NOVAK  - 03/28/16 11:44:00       Anticipated Discharge Date :   04/17/2016 EDT   Anticipated Discharge Time Slot :   1000-1200   Discharge To :   Home with family support   Home Caregiver Name/Relationship :   Alfred-spouse   CM Progress Note :   04/01/2016  SW went to speak with Pt several times this day, but she was always indisposed - SW able to meet 1:1 with spouse.  SW presented the [facility]- Summary.  Pt's spouse does not feel the anticipated D/C date will be met, as far as, Pt making proposed goals.  Spouse would like to see Pt make it to as close to [place] with transfers as possible.  Additionally, Spouse would like to hold off presenting D/C date, due to potential of upsetting Pt.  Pt seems to be struggling with loss of independence and ability.  Please note that Pt has been in/out of hospitals since June.  Next, she has not gotten therapy in the past 3-4 weeks.  Spouse concerned about endurance and muscle loss.  Spouse requested assistance with release of information from Dr. Arlyss in Urologist in [place]  --> send clinical(s) to Dr. Dewane with [facility] (faxed releases).  SW will continue to assist/prn.  vcd     04/08/2016  SW went to relay ITC-Summary to Pt. Pt requested that I return when she has her husbanc.  SW will follow up & continue to assist/prn.     04/09/2016  SW went to meet with Pt & her spouse.  Family would like to explore  sub-acute facilities that have a community with level of services & one that has a golf course close by for spouse.  SW will put together local regressive/progressive communities in the [place] area.  SW will continue to assist/prn.  vcd    04/11/2016  SW was informed by Dr. Stanly that Dr. Brendan will need to see Pt in his office.  SW called Dr. Brendan - Urology:  [facility] [phone]) and spoke with Dorthea who recieved info, but relayed that she would have [place]et, RN call SW back.  SW will continue to assist/prn. vcd    04/15/2016  @ 12:05 SW called Dr. Jennine' office this day to inquire about appointment.  Was informed that Pt has appt on 10/12 @ 1:45, then @ 2:15 for the Dynamic with the PA.  SW will relay info to MD & Pt/spouse & assist with transport.  vcd    04/15/2016  SW met with Pt & spouse to relay ITC-Summary and time/date for urology appt.  SW will need to set up transport with [facility], otherwise, she will not be able to move through the office.  D/C date as changed to 10/18.  Pt's spouse has been visiting facilities on  the list provided, but still has not made a decision.  Pt stated that she would cross that bridge later. Pt is wanting to concentrate on going to Dr. Lawanda office to get a plan for her bowel/bladder function(s).  Next TC will be held on 10/16.  SW will continue to assist.      04/16/2016 @9 :30a SW called this morning to [facility] to inquire about transport via PWC to MD's office.  Jason with [facility] relayed that there is only one tranport co. in the area that would assist with PWC's.  SW faxed info this day as to ensure transport for 04/17/2016  SW will follow up & continue to assist.  vcd     10/16 & 17/2017  SW has met with Pt and her spouse several times in the last two days.  Pt & family has decided on [facility].  During this time, [facility] was concerned about cost of Pt's MS medication.  SW was able to arrange for [facility] rep to meet with Pt & spouse.  All arrangements complete for  transfer on 04/24/2016.  Next [facility] will transfer power wc to [facility].  MD completed order for chest x-ray to r/o TB for sub-acute placement.  SW will make transport arrangements via stretcher.  SW will continue to assist/prn.  vcd             GREG SYLIVA BROCKS - 04/24/2016 10:02 EDT   Discharge Planning   Discharge Arrangements :       Patient Post-Acute Information    Patient Name: Christine [name], Christine [name]  MRN: [id number]  FIN: [id number]  Gender: Female  DOB: [birth year]  Age:  82 Years        *** No Post-Acute Placement(s) Listed ***          *** No Post-Acute Service(s) Listed [facility] Services :   [facility] [phone]) for continued skilled nursing and therapy services   [facility] [phone]) for Power Wheelchair     Discharge Options Discussed with Patient :   Nursing home   Interventions Performed :   Other: Chest x-ray   Discharge Plan Discussion :   Discussed with patient, Discussed with family/caregiver, Patient agrees with plan, Family agrees with plan   Discharge Medicare Message Date/Time :   04/22/2016 10:04 EDT   GREG SYLIVA C - 04/24/2016 10:02 EDT

## 2016-04-24 NOTE — Discharge Summary (Signed)
 Inpatient Patient Summary               [facility]  [address]  [place], [place] [address]  [phone]  Patient Discharge Instructions     Name: Christine [name], Christine [name]  Current Date: 04/24/2016 13:38:37  DOB: 11-13-1933 MRN: [id number] FIN: [id number]  Patient Address: [address] [place] [facility] [address]  Patient Phone: [phone]  Primary Care Provider:  Name: Pcp,  Unknown  Phone:    Immunizations Provided:       Discharge Diagnosis: 1:Multiple sclerosis; 2:Neurogenic bladder; 3:Chronic venous insufficiency; 4:Hypothyroidism; 5:UTI (urinary tract infection); 6:C. difficile colitis; 7:Ileus; 8:Chronic constipation; Insomnia; Urge incontinence of urine  Discharged To: TO, ANTICIPATED%>Home with family support  Home Treatments: TREATMENTS, ANTICIPATED%>  Devices/Equipment: EQUIPMENT REHAB%>Commode (3 in 1) BSC, Grab bars, Other: shower chair  Post Hospital Services: HOSPITAL SERVICES%>[facility] [phone]) for continued skilled nursing and therapy services    [facility] [phone]) for Power Wheelchair      Professional Skilled Services: SKILLED SERVICES%>Occupational Therapy, Physical Therapy  Special Services and Community Resources:               SERV AND COMM RES, ANTICIPATED%>  Mode of Discharge Transportation: TRANSPORTATION%>  Discharge Orders         Discharge Patient 04/24/16 6:33:00 EDT, Discharge Long Term Care         Comment:      Medications   During the course of your visit, your medication list was updated with the most current information. The details of those changes are reflected below:         New Medications  Other Medications  bisacodyl (bisacodyl 10 mg rectal suppository) 1 Suppositories Per rectum (in the rectum) every day as needed constipation.  Last Dose:____________________  fosfomycin (fosfomycin 3 g oral powder for reconstitution) 1 Packets Oral (given by mouth) every 3 days.  Last Dose:____________________  fosfomycin (fosfomycin 3 g oral  powder for reconstitution) 1 Packets Oral (given by mouth) every 72 hours. Give 1 packet by mouth on 04/27/16 and again on 04/30/16..  Last Dose:____________________  hydrocortisone topical (Anusol-HC 25 mg rectal suppository) 1 Suppositories Per rectum (in the rectum) 2 times a day as needed hemorrhoid discomfort.  Last Dose:____________________  hydrocortisone topical (hydrocortisone 1% topical cream) 1 Application Topical (on the skin) 2 times a day.  Last Dose:____________________  lactobacillus acidophilus (Bacid) 1 Tabs Oral (given by mouth) 2 times a day.  Last Dose:____________________  lidocaine topical (lidocaine 5% topical film) Topical (on the skin) every 24 hours as needed mild pain (1-3).  Last Dose:____________________  LORazepam (LORazepam 1 mg oral tablet) 1 Tabs Oral (given by mouth) At Bedtime (Once a Day) as needed insomnia.  Last Dose:____________________  vancomycin 125 Milligram Oral (given by mouth) 2 times a day.  Last Dose:____________________  Medications That Were Updated - Follow Current Instructions  Other Medications  Current polyethylene glycol 3350 (MiraLax) 34 Gram Oral (given by mouth) every day.  Last Dose:____________________    Medications that have not changed  Other Medications  acetaminophen (Tylenol 325 mg oral tablet) 2 Tabs Oral (given by mouth) every 4 hours as needed mild pain (1-3).  Last Dose:____________________  acetaminophen (Tylenol) 500 Milligram Oral (given by mouth) At Bedtime (Once a Day)., MAX DAILY DOSE OF ACETAMINOPHEN = 3000 MG  Last Dose:____________________  aspirin (aspirin 81 mg oral delayed release tablet) 1 Tabs Oral (given by mouth) every day.  Last Dose:____________________  baclofen (baclofen 10 mg oral tablet)  0.5 Tabs Oral (given by mouth) 2 times a day.  Last Dose:____________________  calcium carbonate (calcium carbonate 500 mg (200 mg elemental calcium) oral tablet, chewable) 1 Tabs Chewed every 6 hours as needed indigestion.,  NOTE 1250MG  OF  CALCIUM CARBONATE IS EQUIVALENT TO 500MG  OF ELEMENTAL CALCIUM  Last Dose:____________________  cholecalciferol (Vitamin D3 1000 intl units oral tablet) 2 Tabs Oral (given by mouth) every day.  Last Dose:____________________  conjugated estrogens topical (conjugated estrogens 0.625 mg/g vaginal cream with applicator) 1 g VAG SUN and THU.  Last Dose:____________________  dalfampridine (Ampyra 10 mg oral tablet, extended release) 1 Tabs Oral (given by mouth) every 12 hours.  Last Dose:____________________  gabapentin (gabapentin 300 mg oral capsule) 1 Capsules Oral (given by mouth) 3 times a day.  Last Dose:____________________  heparin (heparin 5000 units/mL injectable solution) Subcutaneous (under the skin) every 8 hours.  Last Dose:____________________  levothyroxine 0.05 Milligram Oral (given by mouth) every day.  Last Dose:____________________  lidocaine-hydrocortisone topical (lidocaine-hydrocortisone 3%-0.5% rectal cream) 1 Application Topical (on the skin) 2 times a day.  Last Dose:____________________  melatonin (melatonin 3 mg oral tablet) 1 Tabs Oral (given by mouth) Once a Day (at bedtime) as needed for insomnia.  Last Dose:____________________  methenamine (methenamine hippurate 1 g oral tablet) 1 Tabs Oral (given by mouth) 2 times a day for 7 Days.  Last Dose:____________________  ondansetron (Zofran 4 mg oral tablet) 1 Tabs Oral (given by mouth) every 6 hours as needed as needed for nausea/vomiting.  Last Dose:____________________  oxybutynin (oxybutynin 5 mg/24 hours oral tablet, extended release) 5 Milligram Oral (given by mouth) every day.  Last Dose:____________________  pantoprazole (pantoprazole 40 mg oral delayed release tablet) 1 Tabs Oral (given by mouth) every day.  Last Dose:____________________  rosuvastatin (rosuvastatin 20 mg oral tablet) 1 Tabs Oral (given by mouth) every day.  Last Dose:____________________  saccharomyces boulardii lyo (saccharomyces boulardii lyo 250 mg oral capsule) 1  Capsules Oral (given by mouth) 2 times a day as needed for loose stool.  Last Dose:____________________  senna (Senna 8.6 mg oral tablet) 2 Tabs Oral (given by mouth) Once a Day (at bedtime) as needed.  Last Dose:____________________  simethicone (simethicone 125 mg oral tablet, chewable) 1 Tabs Chewed 3 times a day.  Last Dose:____________________  traMADol (traMADol 50 mg oral tablet) 0.5 Tabs Oral (given by mouth) every 6 hours as needed.  Last Dose:____________________         [facility] would like to thank you for allowing us  to assist you with your healthcare needs. The following includes patient education materials and information regarding your injury/illness.     Quigley, Galadriel has been given the following list of follow-up instructions, prescriptions, and patient education materials:  Follow-up Instructions:             With: Address: When:   HALINA NORRIS [address]  [place], [place]  [address]  [phone]    Comments:   On Oct. 26th at 9:45 AM.       With: Address: When:   FOLLOW UP WITH PRIMARY CARE IN [place]  DR. PAVAN ANAND AND DR. MATHEW BAKER NEUROLOGIST 1-2 WEEKS AFTER DISCHARGE FROM [facility]                    It is important to always keep an active list of medications available so that you can share with other providers and manage your medications appropriately. As an additional courtesy, we are also providing you with  your final active medications list that you can keep with you.           acetaminophen (Tylenol 325 mg oral tablet) 2 Tabs Oral (given by mouth) every 4 hours as needed mild pain (1-3).  acetaminophen (Tylenol) 500 Milligram Oral (given by mouth) At Bedtime (Once a Day)., MAX DAILY DOSE OF ACETAMINOPHEN = 3000 MG  aspirin (aspirin 81 mg oral delayed release tablet) 1 Tabs Oral (given by mouth) every day.  baclofen (baclofen 10 mg oral tablet) 0.5 Tabs Oral (given by mouth) 2 times a day.  bisacodyl (bisacodyl 10 mg rectal  suppository) 1 Suppositories Per rectum (in the rectum) every day as needed constipation.  calcium carbonate (calcium carbonate 500 mg (200 mg elemental calcium) oral tablet, chewable) 1 Tabs Chewed every 6 hours as needed indigestion.,  NOTE 1250MG  OF CALCIUM CARBONATE IS EQUIVALENT TO 500MG  OF ELEMENTAL CALCIUM  cholecalciferol (Vitamin D3 1000 intl units oral tablet) 2 Tabs Oral (given by mouth) every day.  conjugated estrogens topical (conjugated estrogens 0.625 mg/g vaginal cream with applicator) 1 g VAG SUN and THU.  dalfampridine (Ampyra 10 mg oral tablet, extended release) 1 Tabs Oral (given by mouth) every 12 hours.  fosfomycin (fosfomycin 3 g oral powder for reconstitution) 1 Packets Oral (given by mouth) every 3 days.  fosfomycin (fosfomycin 3 g oral powder for reconstitution) 1 Packets Oral (given by mouth) every 72 hours. Give 1 packet by mouth on 04/27/16 and again on 04/30/16.SABRA  gabapentin (gabapentin 300 mg oral capsule) 1 Capsules Oral (given by mouth) 3 times a day.  heparin (heparin 5000 units/mL injectable solution) Subcutaneous (under the skin) every 8 hours.  hydrocortisone topical (Anusol-HC 25 mg rectal suppository) 1 Suppositories Per rectum (in the rectum) 2 times a day as needed hemorrhoid discomfort.  hydrocortisone topical (hydrocortisone 1% topical cream) 1 Application Topical (on the skin) 2 times a day.  lactobacillus acidophilus (Bacid) 1 Tabs Oral (given by mouth) 2 times a day.  levothyroxine 0.05 Milligram Oral (given by mouth) every day.  lidocaine topical (lidocaine 5% topical film) Topical (on the skin) every 24 hours as needed mild pain (1-3).  lidocaine-hydrocortisone topical (lidocaine-hydrocortisone 3%-0.5% rectal cream) 1 Application Topical (on the skin) 2 times a day.  LORazepam (LORazepam 1 mg oral tablet) 1 Tabs Oral (given by mouth) At Bedtime (Once a Day) as needed insomnia.  melatonin (melatonin 3 mg oral tablet) 1 Tabs Oral (given by mouth) Once a Day (at  bedtime) as needed for insomnia.  methenamine (methenamine hippurate 1 g oral tablet) 1 Tabs Oral (given by mouth) 2 times a day for 7 Days.  ondansetron (Zofran 4 mg oral tablet) 1 Tabs Oral (given by mouth) every 6 hours as needed as needed for nausea/vomiting.  oxybutynin (oxybutynin 5 mg/24 hours oral tablet, extended release) 5 Milligram Oral (given by mouth) every day.  pantoprazole (pantoprazole 40 mg oral delayed release tablet) 1 Tabs Oral (given by mouth) every day.  polyethylene glycol 3350 (MiraLax) 34 Gram Oral (given by mouth) every day.  rosuvastatin (rosuvastatin 20 mg oral tablet) 1 Tabs Oral (given by mouth) every day.  saccharomyces boulardii lyo (saccharomyces boulardii lyo 250 mg oral capsule) 1 Capsules Oral (given by mouth) 2 times a day as needed for loose stool.  senna (Senna 8.6 mg oral tablet) 2 Tabs Oral (given by mouth) Once a Day (at bedtime) as needed.  simethicone (simethicone 125 mg oral tablet, chewable) 1 Tabs Chewed 3 times a day.  traMADol (traMADol 50 mg  oral tablet) 0.5 Tabs Oral (given by mouth) every 6 hours as needed.  vancomycin 125 Milligram Oral (given by mouth) 2 times a day.      Take only the medications listed above. Contact your doctor prior to taking any medications not on this list.        Discharge instructions, if any, will display below     Instructions for Diet: INSTRUCTIONS FOR DIET%>   Instructions for Supplements: SUPPLEMENT INSTRUCTIONS%>   Instructions for Activity: INSTRUCTIONS FOR ACTIVITY%>   Instructions for Wound Care: INSTRUCTIONS FOR WOUND CARE%>     Medication leaflets, if any, will display below     Patient education materials, if any, will display below           IS IT A STROKE? Act FAST and Check for these signs:    FACE                         Does the face look uneven?    ARM                         Does one arm drift down?    SPEECH                    Does their speech sound strange?    TIME                         Call 9-1-1 at any sign of  stroke  Heart Attack Signs  Chest discomfort: Most heart attacks involve discomfort in the center of the chest and lasts more than a few minutes, or goes away and comes back. It can feel like uncomfortable pressure, squeezing, fullness or pain.  Discomfort in upper body: Symptoms can include pain or discomfort in one or both arms, back, neck, jaw or stomach.  Shortness of breath: With or without discomfort.  Other signs: Breaking out in a cold sweat, nausea, or lightheaded.  Remember, MINUTES DO MATTER. If you experience any of these heart attack warning signs, call 9-1-1 to get immediate medical attention!     ---------------------------------------------------------------------------------------------------------------------  [facility] allows you to manage your health, view your test results, and retrieve your discharge documents from your hospital stay securely and conveniently from your computer.  To begin the enrollment process, visit [email or web address] Click on "Sign up now" under [facility].   Yes - Patient/Family/Caregiver demonstrates understanding of instructions given        ______________________________                                 ___________________    Patient/Family/ Caregiver Signature                                                           Date/Time     ______________________________                                 ___________________    Provider Signature  Date/Time

## 2016-04-24 NOTE — Progress Notes (Signed)
Functional Indep Measure Scores - Text       Functional Independence Measure Scores Entered On:  04/24/2016 12:24 EDT    Performed On:  04/24/2016 12:23 EDT by Marisa SprinklesKraft, PT, Magdalene MollyKaitlin C               Transfer Bed/Chair/WC Score   Patient's independence Level with Bed, Chair, Wheelchair Tasks :   Assistance   Type of Assistance Necessary :   Assistance for more than half (patient performs 25% - 49% of tasks)   Bed, Chair, Wheelchair Transfer :   Maximal assistance   LoyalKraft, [place]PT, [place]Kaitlin C - 04/24/2016 12:23 EDT   Walk/Wheelchair Score   Mode of Locomotion Goal :   Wheelchair   Type of Wheelchair Goal :   Power wheelchair   Mode of Locomotion on Discharge :   Wheelchair   Patient Ambulates :   Does not occur   Functional Independence Measure Walk :   Does not occur   Assessed Locomotion in a Wheelchair :   Yes   Distance Traveled in a Wheelchair :   300 ft   Amount of Assistance Necessary :   No assistance necessary   Modified Independence Qualifiers :   Patient can independently operate a manual or motorized wheelchair for a minimum of 150 feet; turns around; maneuvers the chair to a table, bed, toilet; negotiates at least a 3 percent grade; and maneuvers on rugs and over door sills   Functional Independence Measure Wheelchair :   Modified independence   [place]Kraft, Lanier EnsignT, Kaitlin C - 04/24/2016 12:23 EDT   Stairs Score   Stairs Assessed :   Does not occur   Functional Independence Measure Stairs :   Does not occur   Marisa SprinklesKraft, PT, [place] ParkKaitlin C - 04/24/2016 12:23 EDT   Comprehension Score   Mode of Comprehension :   Auditory   Comprehends Complex or Abstract Information Without Prompting or Cueing :   Yes   Understands Complex or Abstract Directions and Conversations :   At all times   Comprehension Indep Measure Interim :   Complete independence   Marisa SprinklesKraft, PT, Magdalene MollyKaitlin C - 04/24/2016 12:23 EDT   Expression Score   Expression Mode :   Vocal   Expresses Complex or Abstract Information Without Prompting or Cueing :   Yes   Expresses Complex or  Abstract Ideas :   Clearly and fluently at all times   Expression Indep Measure Interim :   Complete independence   Maude LericheKraft, PT, Kaitlin C - 04/24/2016 12:23 EDT   Social Interaction Score   Interacts Appropriately Without Supervision :   Yes   Interacts Appropriately :   At all times   Social Interaction Indep Measure Interim :   Complete independence   Maude LericheKraft, PT, Kaitlin C - 04/24/2016 12:23 EDT   Problem Solving Score   Solves Complex Problems :   Yes   Ability to Solve Complex Problems :   Consistently solves problems independently   Problem Solving Indep Measure Interim :   Complete independence   Maude LericheKraft, PT, Kaitlin C - 04/24/2016 12:23 EDT   Memory Score   Recognizes, Remembers Routines, and Executes Requests Without Prompting :   Yes   Remembers and Executes Requests :   Consistently without need for repetition   Memory Indep Measure Interim :   Complete independence   Maude LericheKraft, PT, Kaitlin C - 04/24/2016 12:23 EDT

## 2016-04-24 NOTE — Nursing Note (Signed)
Nursing Discharge Summary - Text       Nursing Discharge Summary Entered On:  04/24/2016 15:28 EDT    Performed On:  04/24/2016 15:27 EDT by Tylene FantasiaBUNTON-WOODS, RN, REBECC               DC Information   Discharge To, Anticipated :   Acute Rehab   Devices/Equipment :   Wheelchair - Power   Mode of Discharge :   Wheelchair   Transportation :   [facility]   BUNTON-WOODS, RN, [facility] - 04/24/2016 15:27 EDT   Education   Responsible Learner(s) :   Living Situation: Home with family support        Performed by: Bobbye CharlestonWATERS, OT, Kem KaysMANDI R - 04/24/2016 07:30  Discharge To: Home with family support        Performed by: Julien GirtEANTONIO,  VELIA C - 04/24/2016 10:02  Home Caregiver Name/Relationship: Alfred-spouse        Performed by: Maude LericheKraft, PT, Kaitlin C - 04/24/2016 12:14     Home Caregiver Present for Session :   Yes   Barriers To Learning :   None evident   Teaching Method :   Explanation, Printed materials   Tylene FantasiaBUNTON-WOODS, RN, [facility] - 04/24/2016 15:27 EDT   Post-Hospital Education Adult Grid   Plan of Care :   Verbalizes understanding   BUNTON-WOODS, RN, [facility] - 04/24/2016 15:27 EDT   Medication Education Adult Grid   Med Generic/Brand Name, Purpose, Action :   Verbalizes understanding   Med Preadministration Procedures :   Bristol-Myers SquibbVerbalizes understanding   Med Teacher, musicpecial Administration, Storage :   Verbalizes understanding   BUNTON-WOODS, RN, [facility] - 04/24/2016 15:27 EDT   Additional Learner(s) Present :   Spouse   BUNTON-WOODS, RN, [facility] - 04/24/2016 15:27 EDT

## 2016-04-24 NOTE — Discharge Summary (Signed)
 Inpatient Clinical Summary             [facility]  Post-Acute Care Transfer Instructions  PERSON INFORMATION   Name: Christine [name], Christine [name]   MRN: [id number]    FIN#: [id number]   PHYSICIANS  Admitting Physician: MARZETTE LYNWOOD BRAVO  Attending Physician: MARZETTE LYNWOOD BRAVO   PCP: Pcp,  Unknown  Discharge Diagnosis: 1:Multiple sclerosis; 2:Neurogenic bladder; 3:Chronic venous insufficiency; 4:Hypothyroidism; 5:UTI (urinary tract infection); 6:C. difficile colitis; 7:Ileus; 8:Chronic constipation; Insomnia; Urge incontinence of urine  Comment:       PATIENT EDUCATION INFORMATION  Instructions:               Medication Leaflets:               Follow-up:                          With: Address: When:   Christine [name] [address]  [place], [place]  [address]  [phone]    Comments:   On Oct. 26th at 9:45 AM.       With: Address: When:   FOLLOW UP WITH PRIMARY CARE IN [place]  DR. PAVAN ANAND AND DR. MATHEW BAKER NEUROLOGIST 1-2 WEEKS AFTER DISCHARGE FROM [facility]                             MEDICATION LIST  Medication Reconciliation at Discharge:         New Medications  Other Medications  bisacodyl (bisacodyl 10 mg rectal suppository) 1 Suppositories Per rectum (in the rectum) every day as needed constipation.  Last Dose:____________________  fosfomycin (fosfomycin 3 g oral powder for reconstitution) 1 Packets Oral (given by mouth) every 3 days.  Last Dose:____________________  fosfomycin (fosfomycin 3 g oral powder for reconstitution) 1 Packets Oral (given by mouth) every 72 hours. Give 1 packet by mouth on 04/27/16 and again on 04/30/16..  Last Dose:____________________  hydrocortisone topical (Anusol-HC 25 mg rectal suppository) 1 Suppositories Per rectum (in the rectum) 2 times a day as needed hemorrhoid discomfort.  Last Dose:____________________  hydrocortisone topical (hydrocortisone 1% topical cream) 1 Application Topical (on the skin) 2 times a  day.  Last Dose:____________________  lactobacillus acidophilus (Bacid) 1 Tabs Oral (given by mouth) 2 times a day.  Last Dose:____________________  lidocaine topical (lidocaine 5% topical film) Topical (on the skin) every 24 hours as needed mild pain (1-3).  Last Dose:____________________  LORazepam (LORazepam 1 mg oral tablet) 1 Tabs Oral (given by mouth) At Bedtime (Once a Day) as needed insomnia.  Last Dose:____________________  vancomycin 125 Milligram Oral (given by mouth) 2 times a day.  Last Dose:____________________  Medications That Were Updated - Follow Current Instructions  Other Medications  Current polyethylene glycol 3350 (MiraLax) 34 Gram Oral (given by mouth) every day.  Last Dose:____________________    Medications that have not changed  Other Medications  acetaminophen (Tylenol 325 mg oral tablet) 2 Tabs Oral (given by mouth) every 4 hours as needed mild pain (1-3).  Last Dose:____________________  acetaminophen (Tylenol) 500 Milligram Oral (given by mouth) At Bedtime (Once a Day)., MAX DAILY DOSE OF ACETAMINOPHEN = 3000 MG  Last Dose:____________________  aspirin (aspirin 81 mg oral delayed release tablet) 1 Tabs Oral (given by mouth) every day.  Last Dose:____________________  baclofen (baclofen 10 mg oral tablet) 0.5 Tabs Oral (given by mouth) 2 times a day.  Last Dose:____________________  calcium carbonate (calcium carbonate 500 mg (200 mg elemental calcium) oral tablet, chewable) 1 Tabs Chewed every 6 hours as needed indigestion.,  NOTE 1250MG  OF CALCIUM CARBONATE IS EQUIVALENT TO 500MG  OF ELEMENTAL CALCIUM  Last Dose:____________________  cholecalciferol (Vitamin D3 1000 intl units oral tablet) 2 Tabs Oral (given by mouth) every day.  Last Dose:____________________  conjugated estrogens topical (conjugated estrogens 0.625 mg/g vaginal cream with applicator) 1 g VAG SUN and THU.  Last Dose:____________________  dalfampridine (Ampyra 10 mg oral tablet, extended release) 1 Tabs Oral (given  by mouth) every 12 hours.  Last Dose:____________________  gabapentin (gabapentin 300 mg oral capsule) 1 Capsules Oral (given by mouth) 3 times a day.  Last Dose:____________________  heparin (heparin 5000 units/mL injectable solution) Subcutaneous (under the skin) every 8 hours.  Last Dose:____________________  levothyroxine 0.05 Milligram Oral (given by mouth) every day.  Last Dose:____________________  lidocaine-hydrocortisone topical (lidocaine-hydrocortisone 3%-0.5% rectal cream) 1 Application Topical (on the skin) 2 times a day.  Last Dose:____________________  melatonin (melatonin 3 mg oral tablet) 1 Tabs Oral (given by mouth) Once a Day (at bedtime) as needed for insomnia.  Last Dose:____________________  methenamine (methenamine hippurate 1 g oral tablet) 1 Tabs Oral (given by mouth) 2 times a day for 7 Days.  Last Dose:____________________  ondansetron (Zofran 4 mg oral tablet) 1 Tabs Oral (given by mouth) every 6 hours as needed as needed for nausea/vomiting.  Last Dose:____________________  oxybutynin (oxybutynin 5 mg/24 hours oral tablet, extended release) 5 Milligram Oral (given by mouth) every day.  Last Dose:____________________  pantoprazole (pantoprazole 40 mg oral delayed release tablet) 1 Tabs Oral (given by mouth) every day.  Last Dose:____________________  rosuvastatin (rosuvastatin 20 mg oral tablet) 1 Tabs Oral (given by mouth) every day.  Last Dose:____________________  saccharomyces boulardii lyo (saccharomyces boulardii lyo 250 mg oral capsule) 1 Capsules Oral (given by mouth) 2 times a day as needed for loose stool.  Last Dose:____________________  senna (Senna 8.6 mg oral tablet) 2 Tabs Oral (given by mouth) Once a Day (at bedtime) as needed.  Last Dose:____________________  simethicone (simethicone 125 mg oral tablet, chewable) 1 Tabs Chewed 3 times a day.  Last Dose:____________________  traMADol (traMADol 50 mg oral tablet) 0.5 Tabs Oral (given by mouth) every 6 hours as needed.  Last  Dose:____________________         Patient's Final Home Medication List Upon Discharge:          acetaminophen (Tylenol 325 mg oral tablet) 2 Tabs Oral (given by mouth) every 4 hours as needed mild pain (1-3).  acetaminophen (Tylenol) 500 Milligram Oral (given by mouth) At Bedtime (Once a Day)., MAX DAILY DOSE OF ACETAMINOPHEN = 3000 MG  aspirin (aspirin 81 mg oral delayed release tablet) 1 Tabs Oral (given by mouth) every day.  baclofen (baclofen 10 mg oral tablet) 0.5 Tabs Oral (given by mouth) 2 times a day.  bisacodyl (bisacodyl 10 mg rectal suppository) 1 Suppositories Per rectum (in the rectum) every day as needed constipation.  calcium carbonate (calcium carbonate 500 mg (200 mg elemental calcium) oral tablet, chewable) 1 Tabs Chewed every 6 hours as needed indigestion.,  NOTE 1250MG  OF CALCIUM CARBONATE IS EQUIVALENT TO 500MG  OF ELEMENTAL CALCIUM  cholecalciferol (Vitamin D3 1000 intl units oral tablet) 2 Tabs Oral (given by mouth) every day.  conjugated estrogens topical (conjugated estrogens 0.625 mg/g vaginal cream with applicator) 1 g VAG SUN and THU.  dalfampridine (Ampyra 10 mg oral tablet, extended release) 1 Tabs Oral (given by  mouth) every 12 hours.  fosfomycin (fosfomycin 3 g oral powder for reconstitution) 1 Packets Oral (given by mouth) every 3 days.  fosfomycin (fosfomycin 3 g oral powder for reconstitution) 1 Packets Oral (given by mouth) every 72 hours. Give 1 packet by mouth on 04/27/16 and again on 04/30/16.SABRA  gabapentin (gabapentin 300 mg oral capsule) 1 Capsules Oral (given by mouth) 3 times a day.  heparin (heparin 5000 units/mL injectable solution) Subcutaneous (under the skin) every 8 hours.  hydrocortisone topical (Anusol-HC 25 mg rectal suppository) 1 Suppositories Per rectum (in the rectum) 2 times a day as needed hemorrhoid discomfort.  hydrocortisone topical (hydrocortisone 1% topical cream) 1 Application Topical (on the skin) 2 times a day.  lactobacillus acidophilus (Bacid) 1  Tabs Oral (given by mouth) 2 times a day.  levothyroxine 0.05 Milligram Oral (given by mouth) every day.  lidocaine topical (lidocaine 5% topical film) Topical (on the skin) every 24 hours as needed mild pain (1-3).  lidocaine-hydrocortisone topical (lidocaine-hydrocortisone 3%-0.5% rectal cream) 1 Application Topical (on the skin) 2 times a day.  LORazepam (LORazepam 1 mg oral tablet) 1 Tabs Oral (given by mouth) At Bedtime (Once a Day) as needed insomnia.  melatonin (melatonin 3 mg oral tablet) 1 Tabs Oral (given by mouth) Once a Day (at bedtime) as needed for insomnia.  methenamine (methenamine hippurate 1 g oral tablet) 1 Tabs Oral (given by mouth) 2 times a day for 7 Days.  ondansetron (Zofran 4 mg oral tablet) 1 Tabs Oral (given by mouth) every 6 hours as needed as needed for nausea/vomiting.  oxybutynin (oxybutynin 5 mg/24 hours oral tablet, extended release) 5 Milligram Oral (given by mouth) every day.  pantoprazole (pantoprazole 40 mg oral delayed release tablet) 1 Tabs Oral (given by mouth) every day.  polyethylene glycol 3350 (MiraLax) 34 Gram Oral (given by mouth) every day.  rosuvastatin (rosuvastatin 20 mg oral tablet) 1 Tabs Oral (given by mouth) every day.  saccharomyces boulardii lyo (saccharomyces boulardii lyo 250 mg oral capsule) 1 Capsules Oral (given by mouth) 2 times a day as needed for loose stool.  senna (Senna 8.6 mg oral tablet) 2 Tabs Oral (given by mouth) Once a Day (at bedtime) as needed.  simethicone (simethicone 125 mg oral tablet, chewable) 1 Tabs Chewed 3 times a day.  traMADol (traMADol 50 mg oral tablet) 0.5 Tabs Oral (given by mouth) every 6 hours as needed.  vancomycin 125 Milligram Oral (given by mouth) 2 times a day.         Comment:       ORDERS         Order Name Order Details   Discharge Patient 04/24/16 6:33:00 EDT, Discharge Long Term Care

## 2016-04-24 NOTE — Progress Notes (Signed)
OT Inpatient Discharge Evaluation -Text       OT Inpatient Discharge Evaluation Entered On:  04/24/2016 8:47 EDT    Performed On:  04/24/2016 7:30 EDT by WATERS, OT, MANDI R               Reason for Treatment   *Reason for Referral :   Admitted for MS exacerbation    82 yo female with past medical history of progressive MS diagnosed in 1989, CVA with right sided weakness, hypothyroidism and neurogenic bladder admitted to [facility] in [place], [place] on 03/06/16 with recurrent generalized weakness of the upper and lower extremities and poor posture. She has been wheelchair bound for the past few years due to right-sided weakness.    (per pt, she did not have a CVA)     *Chief Complaint :   weakness     WATERS, OT, MANDI R - 04/24/2016 8:35 EDT   Home Environment   Living Environment :   Home Environment  *ADL:  Assist needed  Performed By:  Carney Living, AMBER R 04/23/2016  *Cognitive-Communication Skills:  Independent  Performed By:  Carney Living, AMBER R 04/23/2016  *Instrumental ADL:  Assist needed  Performed By:  Carney Living, AMBER R 04/23/2016  *Mobility:  Independent  Performed By:  Carney Living, AMBER R 04/23/2016  Detail Areas of Responsibilities:  lives in [place] with husband in [place]. Has a power w/c. Does not drive. Has shower chair and BSC.  Performed By:  Eliseo Gum, PT, AMBER R 04/23/2016  Devices/Equipment at Home:  Commode (3 in 1) BSC, Grab bars, Other: shower chair  Performed By:  Eliseo Gum, PT, AMBER R 04/23/2016  Kitchen:  1st floor  Performed By:  Carney Living, AMBER R 04/23/2016  Laundry:  1st floor  Performed By:  Carney Living, AMBER R 04/23/2016  Lives In:  Single level home  Performed By:  Carney Living, AMBER R 04/23/2016  Lives With:  Family, Spouse  Performed By:  Carney Living, AMBER R 04/23/2016  Living Situation:  Home with family support  Performed By:  Carney Living, AMBER R 04/23/2016  Patient's Responsibilities:  Leisure/Play/Hobbies, Personal ADL, Social participation  Performed By:  Carney Living, AMBER R  04/23/2016  Primary Bathroom:  1st floor  Performed By:  Carney Living, AMBER R 04/23/2016  Primary Bedroom:  1st floor  Performed By:  Carney Living, AMBER R 04/23/2016  Prior Accessibility Options:  Bathroom modifications  Performed By:  Carney Living, AMBER R 04/23/2016  Professional Skilled Services:  Occupational Therapy, Physical Therapy  Performed By:  Elmer Ramp, RN, MARIA NORLIS 03/29/2016     Living Situation :   Home with family support   Lives With :   Family, Spouse   Lives In :   Single level home   [place], OT, MANDI R - 04/24/2016 8:35 EDT   Home Setup Grid   Primary Bedroom :   1st floor   Primary Bathroom :   1st floor   Kitchen :   1st floor   Laundry :   1st floor   WATERS, OT, MANDI R - 04/24/2016 8:35 EDT   Patient's Responsibilities Rehab :   Leisure/Play/Hobbies, Personal ADL, Social participation   Detail Areas of Responsibilities :   lives in [place] with husband in [place]. Has a power w/c. Does not drive. Has shower chair and BSC.   WATERS, OT, MANDI R - 04/24/2016 8:35 EDT   Home Environment II   Living Environment :   Home Environment  Devices/Equipment at Home:  Wheelchair  Performed By:  Elmer Ramp, RN, MARIA NORLIS 03/29/2016  Living Situation:  Home with family support  Performed By:  Dionisio David, MARIA NORLIS 03/29/2016  Professional Skilled Services:  Occupational Therapy, Physical Therapy  Performed By:  Elmer Ramp, RN, MARIA NORLIS 03/29/2016  Kitchen:  1st floor  Performed By:  Trisha Mangle 03/28/2016  Laundry:  1st floor  Performed By:  Marylou Mccoy B 03/28/2016  Lives In:  Single level home  Performed By:  Trisha Mangle 03/28/2016  Lives With:  Family, Spouse  Performed By:  Marylou Mccoy B 03/28/2016  Patient's Responsibilities:  Leisure/Play/Hobbies, Personal ADL, Social participation  Performed By:  Trisha Mangle 03/28/2016  Primary Bathroom:  1st floor  Performed By:  Marylou Mccoy B 03/28/2016  Primary Bedroom:  1st floor  Performed By:  Marylou Mccoy B 03/28/2016     Devices/Equipment  at Home :   Commode (3 in 1) BSC, Grab bars, Other: shower chair   WATERS, OT, MANDI R - 04/24/2016 8:35 EDT   Prior Functional Status Grid   ADL :   Assist needed   Mobility :   Independent   Instrumental ADL :   Assist needed   Cognitive-Communication Skills :   Independent   WATERS, OT, MANDI R - 04/24/2016 8:35 EDT   Additional Information :   mobility independent from PWC level   WATERS, OT, MANDI R - 04/24/2016 8:35 EDT   OT Basic ADL   Basic ADL Grid   Eating :   Modified independence   Grooming :   Modified independence   Bathing :   Minimal contact assistance   UE Dressing :   Moderate assistance   LE Dressing :   Total assistance   Toileting :   Total assistance   Transfer Toilet :   Total assistance   Tub Transfer :   Does not occur   Shower Transfer :   Total assistance   WATERS, OT, MANDI R - 04/24/2016 8:35 EDT   ADL Comments :   ADL 10/18-Pt total A for all transfers. Pt completed bathing with min A for bottom. Used LHS for B feet. Mod assist  for UB dressing for pulling down in back and bra .  Total A for LB dressing.       OT ADL Reviewed :   Yes   WATERS, OT, MANDI R - 04/24/2016 8:35 EDT   Standing Balance   Static Standing Balance   Stands Without UE Support :   Does not occur   Stands with One UE Support :   Does not occur   Stands with Two UE Support :   Does not occur   WATERS, OT, MANDI R - 04/24/2016 8:35 EDT   Sitting Balance Assessment   Sitting Surface Evaluated Upon :   Bed   WATERS, OT, MANDI R - 04/24/2016 8:35 EDT   Static Sitting Balance Assessment Grid   Sits Without UE Support :   Setup   Sits With One UE Support :   Setup   Sits With Two UE Support :   Setup   WATERS, OT, MANDI R - 04/24/2016 8:35 EDT   Dynamic Sitting Balance Assessment Grid   Anterior Shift :   Able   Posterior Shift :   Able   Lateral to the Left Shift :   Able   Lateral to the Right Shift :  Able   WATERS, OT, MANDI R - 04/24/2016 8:35 EDT   Righting Reactions Grid   Left Protective Reactions :   Delayed    Right Protective Reactions :   Delayed   Left Righting Reactions :   Delayed   Right Righting Reactions :   Delayed   WATERS, OT, MANDI R - 04/24/2016 8:35 EDT   Cognition   Cognition :   Within functional limits   WATERS, OT, MANDI R - 04/24/2016 8:35 EDT   Attention Cognition Grid   Sustained :   Within functional limits   Alternating :   Within functional limits   Divided :   Within functional limits   Attention to Detail :   Within functional limits   WATERS, OT, MANDI R - 04/24/2016 8:35 EDT   Memory Cognition Grid   Encoding :   Impaired   Retrieval :   Within functional limits   Prospective :   Impaired   Procedural :   Impaired   Storage :   Impaired   WATERS, OT, MANDI R - 04/24/2016 8:35 EDT   Executive Function Cognition Grid   Initiation :   Within functional limits   Planning and Organization :   Within functional limits   Self Monitoring :   Within functional limits   Problem Solving Skills :   Impaired   WATERS, OT, MANDI R - 04/24/2016 8:35 EDT   Cognition Indep Measure   Comprehension Indep Measure Interim :   Complete independence   Comprehension Mode Indep Measure :   Auditory   Expression Indep Measure Interim :   Complete independence   Expression Mode Indep Measure :   Vocal   Social Interaction Indep Measure Interim :   Complete independence   Problem Solving Indep Measure Interim :   Complete independence   Memory Indep Measure Interim :   Complete independence   WATERS, OT, MANDI R - 04/24/2016 8:35 EDT   Vision/Perception   Vision Status :   Within functional limits   Perception Status :   Other: reported no changes in vision and no hx of visual blurriness/has not seen opthamologist to evaluate vision related to MS   Glasses :   Yes   WATERS, OT, MANDI R - 04/24/2016 8:35 EDT   Vision/Perception Assessment Grid   Convergence :   Impaired   WATERS, OT, MANDI R - 04/24/2016 8:35 EDT   UE ROM/Strength   Lt Upper Extremity Strength :   Other: hx of rotator cuff tear   WATERS, OT, MANDI R -  04/24/2016 8:35 EDT   Left Upper Extremity Strength Grid   Shoulder Flexion :   2+   Shoulder Extension :   2+   Elbow Flexion :   3+   Elbow Extension :   3+   Wrist Flexion :   4   Wrist Extension :   4   Finger Flexion :   3+   Finger Extension :   3+   WATERS, OT, MANDI R - 04/24/2016 8:35 EDT   Rt Upper Extremity Strength :   Other: hx of ulnar nerve surgery   WATERS, OT, MANDI R - 04/24/2016 8:35 EDT   Right Upper Extremity Strength Grid   Shoulder Flexion :   1   Shoulder Extension :   1   Elbow Flexion :   2   Elbow Extension :   2   Wrist Flexion :   2  Wrist Extension :   2   Finger Flexion :   2-   WATERS, OT, MANDI R - 04/24/2016 8:35 EDT   UE Coordination   Mean Age Gender Lt 9 Hole Peg Test :   24.11   Right Mean for Age/Gender :   22.49   WATERS, OT, MANDI R - 04/24/2016 8:35 EDT   Left Upper Extremity Coordination Grid   Finger to Nose :   Impaired   Finger Opposition :   Impaired   WATERS, OT, MANDI R - 04/24/2016 8:35 EDT   Right Upper Extremty Coordination Grid   Finger to Nose :   Impaired   Finger Opposition :   Impaired   WATERS, OT, MANDI R - 04/24/2016 8:35 EDT   UE Sensation   Left Sensation Grid   Light Touch :   Impaired   Sharp/Dull :   Impaired   WATERS, OT, MANDI R - 04/24/2016 8:35 EDT   Right Sensation Grid   Light Touch :   Impaired   Sharp/Dull :   Impaired   WATERS, OT, MANDI R - 04/24/2016 8:35 EDT   Impact of Impaired UE Sensation :   numbness/tingling in BUE   WATERS, OT, MANDI R - 04/24/2016 8:35 EDT   UE Function   Previous Hand Dominance :   Right   Current Hand Dominance :   Left   WATERS, OT, MANDI R - 04/24/2016 8:35 EDT   UE Tone         remainder (less than 1/2 of ROM)   Left Upper Extremity :   Normal   Right Upper Extremity :   Normal   WATERS, OT, MANDI R - 04/24/2016 8:35 EDT   Assessment   OT Impairments or Limitations :   Balance deficits, Basic activity of daily living deficits, Coordination deficits, Endurance deficits, Equipment training, IADL deficits,  Mobility deficits, Proprioception deficits, Safety awareness deficits, Strength deficits   Barriers to Safe Discharge OT :   Complicated medical history, Decreased communication, Limited family support, Limited social support, Medical diagnosis, Progressive nature of disease, Safety awareness, Severity of deficits   OT Discharge Recommendations :   ELOS 3 weeks then home with spouse and power w/c     OT Treatment Recommendations :   Pt d/c'ing to SNF this date for further therapy services to address strength, sitting balance, ADL's, and mobility.      WATERS, OT, MANDI R - 04/24/2016 8:35 EDT   Long Term Goals   OT Patient/Caregiver Goal :   To get stronger, to be independent   WATERS, OT, MANDI R - 04/24/2016 8:35 EDT   OT IP Long Term Goals Grid     Long Term Goal 1  Long Term Goal 2        Goal :    Pt will perform simple meal prep activity with min assist to improve indep in ADLs   Pt will improve BUE strength to 3+/5 to improve indep in ADLS           Status :    Not met   Not met             WATERS, OT, MANDI R - 04/24/2016 8:35 EDT  WATERS, OT, MANDI R - 04/24/2016 8:35 EDT        ADL Long Term Goals Grid   Eating Goal :   Complete independence   (Comment: goal met [WATERS, OT, MANDI R - 04/24/2016 8:35 EDT] )  Grooming Goal :   Modified independence   (Comment: goal met [WATERS, OT, MANDI R - 04/24/2016 8:35 EDT] )   Bathing Goal :   Minimal contact assistance   (Comment: goal met [WATERS, OT, MANDI R - 04/24/2016 8:35 EDT] )   Upper Extremity Dressing :   Minimal contact assistance   (Comment: not met [WATERS, OT, MANDI R - 04/24/2016 8:35 EDT] )   Lower Body Dressing Goal :   Maximal assistance   (Comment: not met [WATERS, OT, MANDI R - 04/24/2016 8:35 EDT] )   Toileting Goal :   Maximal assistance   (Comment: not met [WATERS, OT, MANDI R - 04/24/2016 8:35 EDT] )   Toilet Transfer Goal :   Maximal assistance   (Comment: not met [WATERS, OT, MANDI R - 04/24/2016 8:35 EDT] )   WATERS, OT, MANDI R -  04/24/2016 8:35 EDT   OT LTG Reconcilation :   Pt made small gailns towards goals.    Comprehension Goal Indep Measure :   Complete independence   Comprehension Mode Goal :   Auditory, Visual   Expression Goal Indep Measure :   Complete independence   Expression Mode Goal :   Vocal   Social Interaction Goal Indep Measure :   Complete independence   Problem Solving Goal Indep Measure :   Modified independence   Memory Goal Indep Measure :   Modified independence   OT LT Goals Reviewed :   Yes   WATERS, OT, MANDI R - 04/24/2016 8:35 EDT   Short Term Goals   Eating Goal Grid     Goal #1          Activity :    Manage food containers/packages              Assist :    Distant supervision              Status :    Goal met              Date Met :    04/09/2016 EDT                WATERS, OT, MANDI R - 04/24/2016 8:35 EDT         Grooming Goal Grid     Goal #1          Activity :    Grooming routine              Descriptors :    Supported short sit, Unsupported short sit, Wheelchair              Assist :    Contact guard assistance              Status :    Goal met              Date Met :    04/08/2016 EDT                WATERS, OT, MANDI R - 04/24/2016 8:35 EDT         Upper Body Dressing Short Term Goal Grid     Goal #1  Goal #2        Activity :    Upper extremity dressing   Upper extremity dressing           Assist :    Moderate assistance   Minimal assistance           Status :  Goal met   Not met             WATERS, OT, MANDI R - 04/24/2016 8:35 EDT  WATERS, OT, MANDI R - 04/24/2016 8:35 EDT        Lower Body Dressing Grid     Goal #1          Activity :    Lower extremity dressing              Lower Body Dressing Descriptors :    Supine, Supported short sit, Unsupported short sit              Assist :    Maximal assistance              Status :    Not met                WATERS, OT, MANDI R - 04/24/2016 8:35 EDT         Bathing Goal Grid     Goal #1  Goal #2        Activity :    Bathe   Bathe           Descriptors :     Supported short sit, Unsupported short sit, Wheelchair              Assist :    Maximal assistance   Moderate assistance           Status :    Goal met   Goal met           Date Met :    04/08/2016 EDT                WATERS, OT, MANDI R - 04/24/2016 8:35 EDT  WATERS, OT, MANDI R - 04/24/2016 8:35 EDT        Toileting and Transfers Goal Grid     Goal #1  Goal #2        Activity :    Toilet transfers   Toileting           Assist :    Maximal assistance   Maximal assistance           Equipment :    Bedside commode              Transfer Equipment :    Drop arm commode, Elevated toilet seat, Grab bars, Sliding board   Drop arm commode, Elevated toilet seat, Grab bars, Sliding board           Status :    Not met   Not met             WATERS, OT, MANDI R - 04/24/2016 8:35 EDT  WATERS, OT, MANDI R - 04/24/2016 8:35 EDT        OT Balance Goal Grid     Goal #1          Type :    Static sitting              Descriptors :    Unsupported short sit              Assist :    Contact guard assistance              Length of Time (minutes) :    5 minutes              Rationale :    Improve independence with  activities of daily living              Status :    Goal met                WATERS, OT, MANDI R - 04/24/2016 8:35 EDT         OT ST Goals Reviewed :   Yes   WATERS, OT, MANDI R - 04/24/2016 8:35 EDT   Plan   Planned Treatments :   Discharge/Discontinue OT treatment   Treatment Plan/Goals Established With Patient/Caregiver :   Yes   OT Evaluation Complete :   Yes   WATERS, OT, MANDI R - 04/24/2016 8:35 EDT   Time Spent With Patient   OT Time In :   7:30 EST   OT Time Out :   9:00 EST   OT ADL TRAINING 15 MIN :   6    OT ADL Training Minutes :   90 minutes   OT Total Individual Therapy Time :   90 minutes   OT Total Timed Code Treatment Units :   6 units   OT Total Timed Code Treatment Minutes :   90 minutes   OT Total Treatment Time Rehab :   90 minutes   WATERS, OT, MANDI R - 04/24/2016 8:35 EDT   Care Tool Section GG: Self Care  Functional Abilities   OT Care Tool Progress :   Independent - Patient/Resident completes the activities by him/herself, with or without an assistive device, with no assistance from a helper. - 06   GG0130 Oral Hygiene :   Independent - Patient/Resident completes the activities by him/herself, with or without an assistive device, with no assistance from a helper. - 06   GG0130 Toileting Hygiene :   Dependent - Helper does ALL of the effort. Patient/Resident does none of the effort to complete the activity or the assistance of 2 or more helpers is required for the patient/resident to complete the activity. - 01   GG0170 Toilet Transfer :   Dependent - Helper does ALL of the effort. Patient/Resident does none of the effort to complete the activity or the assistance of 2 or more helpers is required for the patient/resident to complete the activity. - 01   GG0130 Shower, Bathe Self :   Supervision or touching assistance - Helper provides VERBAL CUES or TOUCHING/STEADYING assistance as patient/resident completes activity. Assistance may be provided throughout the activity or intermittently. - 04   GG0130 Upper Body Dressing :   Partial/Moderate assistance - Helper does LESS THAN HALF the effort. Helper lifts, holds or supports trunk or limbs, but provides less than half the effort. - 03   GG0130 Lower Body Dressing :   Dependent - Helper does ALL of the effort. Patient/Resident does none of the effort to complete the activity or the assistance of 2 or more helpers is required for the patient/resident to complete the activity. - 01   GG0130 Putting On, Taking Off Footwear :   Dependent - Helper does ALL of the effort. Patient/Resident does none of the effort to complete the activity or the assistance of 2 or more helpers is required for the patient/resident to complete the activity. - 01   WATERS, OT, MANDI R - 04/24/2016 8:35 EDT

## 2016-04-25 DIAGNOSIS — N319 Neuromuscular dysfunction of bladder, unspecified: Secondary | ICD-10-CM | POA: Insufficient documentation

## 2016-04-25 DIAGNOSIS — S46009A Unspecified injury of muscle(s) and tendon(s) of the rotator cuff of unspecified shoulder, initial encounter: Secondary | ICD-10-CM | POA: Insufficient documentation

## 2016-05-14 NOTE — Discharge Summary (Signed)
 Inpatient Patient Summary               [facility]  [address]  [place], [place] [address]  [phone]  Patient Discharge Instructions    Name: Christine [name], Christine [name]  Current Date: 05/14/2016 12:51:28  DOB: 12/10/1933 MRN: [id number] FIN: [id number]%>[phone]  Patient Address: [address] [place] [facility] [address]  Patient Phone: [phone]  Primary Care Provider:  Name: Pcp,  Unknown  Phone:    Immunizations Provided:      Discharge Diagnosis: C. difficile diarrhea; Diarrhea  Discharged To: TO, ANTICIPATED%>  Home Treatments: TREATMENTS, ANTICIPATED%>  Devices/Equipment: EQUIPMENT REHAB%>  Post Hospital Services: HOSPITAL SERVICES%>  Professional Skilled Services: SKILLED SERVICES%>  Therapist, sports and Community Resources: SERV AND COMM RES, ANTICIPATED%>  Mode of Discharge Transportation: TRANSPORTATION%>  Discharge Orders         Discharge Patient 05/14/16 12:42:00 EST, Discharge Home/Self Care, Follow up to be arranged  Discharge Special Instructions Lay right side down for 45 minutes. Do not eat until 6 pm. Try to avoid first BM as long as possible.        Comment:     Medications   During the course of your visit, your medication list was updated with the most current information. The details of those changes are reflected below:         Medications That Have Not Changed  Other Medications  acetaminophen (Tylenol 325 mg oral tablet) 2 Tabs Oral (given by mouth) every 4 hours as needed mild pain (1-3).  Last Dose:____________________  acetaminophen (Tylenol) 500 Milligram Oral (given by mouth) At Bedtime (Once a Day)., MAX DAILY DOSE OF ACETAMINOPHEN = 3000 MG  Last Dose:____________________  aspirin (aspirin 81 mg oral delayed release tablet) 1 Tabs Oral (given by mouth) every day.  Last Dose:____________________  baclofen (baclofen 10 mg oral tablet) 0.5 Tabs Oral (given by mouth) 2 times a day.  Last Dose:____________________  bisacodyl (bisacodyl 10 mg rectal suppository) 1 Suppositories Per rectum (in  the rectum) every day as needed constipation.  Last Dose:____________________  calcium carbonate (calcium carbonate 500 mg (200 mg elemental calcium) oral tablet, chewable) 1 Tabs Chewed every 6 hours as needed indigestion.,  NOTE 1250MG  OF CALCIUM CARBONATE IS EQUIVALENT TO 500MG  OF ELEMENTAL CALCIUM  Last Dose:____________________  cholecalciferol (Vitamin D3 1000 intl units oral tablet) 2 Tabs Oral (given by mouth) every day.  Last Dose:____________________  conjugated estrogens topical (conjugated estrogens 0.625 mg/g vaginal cream with applicator) 1 g VAG SUN and THU.  Last Dose:____________________  dalfampridine (Ampyra 10 mg oral tablet, extended release) 1 Tabs Oral (given by mouth) every 12 hours.  Last Dose:____________________  fosfomycin (fosfomycin 3 g oral powder for reconstitution) 1 Packets Oral (given by mouth) every 72 hours. Give 1 packet by mouth on 04/27/16 and again on 04/30/16..  Last Dose:____________________  fosfomycin (fosfomycin 3 g oral powder for reconstitution) 1 Packets Oral (given by mouth) every 3 days.  Last Dose:____________________  gabapentin (gabapentin 300 mg oral capsule) 1 Capsules Oral (given by mouth) 3 times a day.  Last Dose:____________________  heparin (heparin 5000 units/mL injectable solution) Subcutaneous (under the skin) every 8 hours.  Last Dose:____________________  hydrocortisone topical (Anusol-HC 25 mg rectal suppository) 1 Suppositories Per rectum (in the rectum) 2 times a day as needed hemorrhoid discomfort.  Last Dose:____________________  hydrocortisone topical (hydrocortisone 1% topical cream) 1 Application Topical (on the skin) 2 times a day.  Last Dose:____________________  lactobacillus acidophilus (Bacid) 1 Tabs Oral (given by mouth)  2 times a day.  Last Dose:____________________  levothyroxine 0.05 Milligram Oral (given by mouth) every day.  Last Dose:____________________  lidocaine topical (lidocaine 5% topical film) Topical (on the skin) every  24 hours as needed mild pain (1-3).  Last Dose:____________________  lidocaine-hydrocortisone topical (lidocaine-hydrocortisone 3%-0.5% rectal cream) 1 Application Topical (on the skin) 2 times a day.  Last Dose:____________________  LORazepam (LORazepam 1 mg oral tablet) 1 Tabs Oral (given by mouth) At Bedtime (Once a Day) as needed insomnia.  Last Dose:____________________  melatonin (melatonin 3 mg oral tablet) 1 Tabs Oral (given by mouth) Once a Day (at bedtime) as needed for insomnia.  Last Dose:____________________  methenamine (methenamine hippurate 1 g oral tablet) 1 Tabs Oral (given by mouth) 2 times a day for 7 Days.  Last Dose:____________________  ondansetron (Zofran 4 mg oral tablet) 1 Tabs Oral (given by mouth) every 6 hours as needed as needed for nausea/vomiting.  Last Dose:____________________  oxybutynin (oxybutynin 5 mg/24 hours oral tablet, extended release) 5 Milligram Oral (given by mouth) every day.  Last Dose:____________________  pantoprazole (pantoprazole 40 mg oral delayed release tablet) 1 Tabs Oral (given by mouth) every day.  Last Dose:____________________  polyethylene glycol 3350 (MiraLax) 34 Gram Oral (given by mouth) every day.  Last Dose:____________________  rosuvastatin (rosuvastatin 20 mg oral tablet) 1 Tabs Oral (given by mouth) every day.  Last Dose:____________________  saccharomyces boulardii lyo (saccharomyces boulardii lyo 250 mg oral capsule) 1 Capsules Oral (given by mouth) 2 times a day as needed for loose stool.  Last Dose:____________________  senna (Senna 8.6 mg oral tablet) 2 Tabs Oral (given by mouth) Once a Day (at bedtime) as needed.  Last Dose:____________________  simethicone (simethicone 125 mg oral tablet, chewable) 1 Tabs Chewed 3 times a day.  Last Dose:____________________  traMADol (traMADol 50 mg oral tablet) 0.5 Tabs Oral (given by mouth) every 6 hours as needed.  Last Dose:____________________  These Medications Were Removed and Should No Longer Be  Taken  vancomycin 125 Milligram Oral (given by mouth) 2 times a day.  Stop Taking Reason: Physician Request        [facility] would like to thank you for allowing us  to assist you with your healthcare needs. The following includes patient education materials and information regarding your injury/illness.    Rossel, Jazleen has been given the following list of follow-up instructions, prescriptions, and patient education materials:  Follow-up Instructions:            It is important to always keep an active list of medications available so that you can share with other providers and manage your medications appropriately. As an additional courtesy, we are also providing you with your final active medications list that you can keep with you.          acetaminophen (Tylenol 325 mg oral tablet) 2 Tabs Oral (given by mouth) every 4 hours as needed mild pain (1-3).  acetaminophen (Tylenol) 500 Milligram Oral (given by mouth) At Bedtime (Once a Day)., MAX DAILY DOSE OF ACETAMINOPHEN = 3000 MG  aspirin (aspirin 81 mg oral delayed release tablet) 1 Tabs Oral (given by mouth) every day.  baclofen (baclofen 10 mg oral tablet) 0.5 Tabs Oral (given by mouth) 2 times a day.  bisacodyl (bisacodyl 10 mg rectal suppository) 1 Suppositories Per rectum (in the rectum) every day as needed constipation.  calcium carbonate (calcium carbonate 500 mg (200 mg elemental calcium) oral tablet, chewable) 1 Tabs Chewed every 6 hours as needed indigestion.,  NOTE  1250MG  OF CALCIUM CARBONATE IS EQUIVALENT TO 500MG  OF ELEMENTAL CALCIUM  cholecalciferol (Vitamin D3 1000 intl units oral tablet) 2 Tabs Oral (given by mouth) every day.  conjugated estrogens topical (conjugated estrogens 0.625 mg/g vaginal cream with applicator) 1 g VAG SUN and THU.  dalfampridine (Ampyra 10 mg oral tablet, extended release) 1 Tabs Oral (given by mouth) every 12 hours.  fosfomycin (fosfomycin 3 g oral powder for reconstitution) 1 Packets Oral (given by mouth)  every 72 hours. Give 1 packet by mouth on 04/27/16 and again on 04/30/16..  fosfomycin (fosfomycin 3 g oral powder for reconstitution) 1 Packets Oral (given by mouth) every 3 days.  gabapentin (gabapentin 300 mg oral capsule) 1 Capsules Oral (given by mouth) 3 times a day.  heparin (heparin 5000 units/mL injectable solution) Subcutaneous (under the skin) every 8 hours.  hydrocortisone topical (Anusol-HC 25 mg rectal suppository) 1 Suppositories Per rectum (in the rectum) 2 times a day as needed hemorrhoid discomfort.  hydrocortisone topical (hydrocortisone 1% topical cream) 1 Application Topical (on the skin) 2 times a day.  lactobacillus acidophilus (Bacid) 1 Tabs Oral (given by mouth) 2 times a day.  levothyroxine 0.05 Milligram Oral (given by mouth) every day.  lidocaine topical (lidocaine 5% topical film) Topical (on the skin) every 24 hours as needed mild pain (1-3).  lidocaine-hydrocortisone topical (lidocaine-hydrocortisone 3%-0.5% rectal cream) 1 Application Topical (on the skin) 2 times a day.  LORazepam (LORazepam 1 mg oral tablet) 1 Tabs Oral (given by mouth) At Bedtime (Once a Day) as needed insomnia.  melatonin (melatonin 3 mg oral tablet) 1 Tabs Oral (given by mouth) Once a Day (at bedtime) as needed for insomnia.  methenamine (methenamine hippurate 1 g oral tablet) 1 Tabs Oral (given by mouth) 2 times a day for 7 Days.  ondansetron (Zofran 4 mg oral tablet) 1 Tabs Oral (given by mouth) every 6 hours as needed as needed for nausea/vomiting.  oxybutynin (oxybutynin 5 mg/24 hours oral tablet, extended release) 5 Milligram Oral (given by mouth) every day.  pantoprazole (pantoprazole 40 mg oral delayed release tablet) 1 Tabs Oral (given by mouth) every day.  polyethylene glycol 3350 (MiraLax) 34 Gram Oral (given by mouth) every day.  rosuvastatin (rosuvastatin 20 mg oral tablet) 1 Tabs Oral (given by mouth) every day.  saccharomyces boulardii lyo (saccharomyces boulardii lyo 250 mg oral capsule) 1 Capsules  Oral (given by mouth) 2 times a day as needed for loose stool.  senna (Senna 8.6 mg oral tablet) 2 Tabs Oral (given by mouth) Once a Day (at bedtime) as needed.  simethicone (simethicone 125 mg oral tablet, chewable) 1 Tabs Chewed 3 times a day.  traMADol (traMADol 50 mg oral tablet) 0.5 Tabs Oral (given by mouth) every 6 hours as needed.      Take only the medications listed above. Contact your doctor prior to taking any medications not on this list.      Discharge instructions, if any, will display below    Instructions for Diet: INSTRUCTIONS FOR DIET%>   Instructions for Supplements: SUPPLEMENT INSTRUCTIONS%>   Instructions for Activity: INSTRUCTIONS FOR ACTIVITY%>   Instructions for Wound Care: INSTRUCTIONS FOR WOUND CARE%>    Medication leaflets, if any, will display below     Patient education materials, if any, will display below               Anesthesia: Monitored Anesthesia Care (MAC)  You're due to have surgery. During surgery, you'll be given medication called anesthesia. This will keep you  comfortable and pain-free. Your surgeon will use monitored anesthesia care (MAC). This sheet tells you more about this type of anesthesia.  What is monitored anesthesia care?  MAC keeps you very drowsy during surgery. You may be awake, but you will likely not remember much. And you won't feel pain. With MAC, medications are given through an IV line into a vein in your arm or hand. A local anesthetic will usually be injected into the skin and muscle around the surgical site to numb it. The anesthesia provider monitors you during the procedure. He or she checks your heart rate and rhythm, blood pressure, and blood oxygen level.  Anesthesia tools and medications that may be near you during your procedure  You will likely have:   A pulse oximeter on the end of your finger. This measures your blood oxygen level.   Electrocardiography leads (electrodes) on your chest. These record your heart rate and rhythm.   Medications  given through an IV. These relax you and prevent pain. You may be awake or sleep lightly. If you have local anesthetic, it is injected directly into your skin.   A facemask to give you oxygen, if needed.  Risks and Possible Complications  MAC has some risks. These include:   Breathing problems   Nausea and vomiting   Allergic reaction to the anesthetic   Anesthesia safety   Follow all instructions you are given for how long not to eat or drink before your procedure.   Be sure your doctor knows what medications you take, especially any anti-inflammatory medication or blood thinners. This includes aspirin and any other over-the-counter medications, herbs, and supplements.   Have an adult family member or friend drive you home after the procedure.   For the first 24 hours after your surgery:   Do not drive or use heavy equipment.   Do not make important decisions or sign documents.   Avoid alcohol.   Have someone stay with you, if possible. They can watch for problems and help keep you safe.     [address] [employer] [address] [place], [place] [address]. All rights reserved. This information is not intended as a substitute for professional medical care. Always follow your healthcare professional's instructions.          Colonoscopy       A camera attached to a flexible tube with a viewing lens is used to take video pictures.    Colonoscopy is a test to view the inside of your lower digestive tract (colon and rectum). Sometimes it can show the last part of the small intestine (ileum). During the test, small pieces of tissue may be removed for testing. This is called a biopsy. Small growths, such as polyps, may also be removed.    Why is colonoscopy done?   The test is done to help look for colon cancer. And it can help find the source of abdominal pain, bleeding, and changes in bowel habits. It may be needed once a year, depending on factors such as your:    Age    Health history     Family health history    Symptoms    Results from any prior colonoscopy   Risks and possible complications   These include:    Bleeding                 A puncture or tear in the colon     Risks of anesthesia  A cancer lesion not being seen   Getting ready    To prepare for the test:    Talk with your healthcare provider about the risks of the test (see below). Also ask your healthcare provider about alternatives to the test.    Tell your healthcare provider about any medicines you take. Also tell him or her about any health conditions you may have.    Make sure your rectum and colon are empty for the test. Follow the diet and bowel prep instructions exactly. If you dont, the test may need to be rescheduled.    Plan for a friend or family member to drive you home after the test.       Colonoscopy provides an inside view of the entire colon.    You may discuss the results with your doctor right away or at a future visit.   During the test    The test is usually done in the hospital on an outpatient basis. This means you go home the same day. The procedure takes about 30 minutes. During that time:    You are given relaxing (sedating) medicine through an IV line. You may be drowsy, or fully asleep.    The healthcare provider will first give you a physical exam to check for anal and rectal problems.    Then the anus is lubricated and the scope inserted.    If you are awake, you may have a feeling similar to needing to have a bowel movement. You may also feel pressure as air is pumped into the colon. Its OK to pass gas during the procedure.    Biopsy, polyp removal, or other treatments may be done during the test.   After the test    You may have gas right after the test. It can help to try to pass it to help prevent later bloating. Your healthcare provider may discuss the results with you right away. Or you may need to schedule a follow-up visit to talk about the results. After the test, you can go  back to your normal eating and other activities. You may be tired from the sedation and need to rest for a few hours.      2000-2017 [employer] [address], [place], [place] [address]. All rights reserved. This information is not intended as a substitute for professional medical care. Always follow your healthcare professional's instructions.           IS IT A STROKE? Act FAST and Check for these signs:    FACE                         Does the face look uneven?    ARM                         Does one arm drift down?    SPEECH                    Does their speech sound strange?    TIME                         Call 9-1-1 at any sign of stroke  Heart Attack Signs  Chest discomfort: Most heart attacks involve discomfort in the center of the chest and lasts more than a few minutes, or goes away and comes back. It can feel  like uncomfortable pressure, squeezing, fullness or pain.  Discomfort in upper body: Symptoms can include pain or discomfort in one or both arms, back, neck, jaw or stomach.  Shortness of breath: With or without discomfort.  Other signs: Breaking out in a cold sweat, nausea, or lightheaded.  Remember, MINUTES DO MATTER. If you experience any of these heart attack warning signs, call 9-1-1 to get immediate medical attention!     ---------------------------------------------------------------------------------------------------------------------  [facility] allows you to manage your health, view your test results, and retrieve your discharge documents from your hospital stay securely and conveniently from your computer.  To begin the enrollment process, visit [email or web address] Click on "Sign up now" under [facility].   Yes - Patient/Family/Caregiver demonstrates understanding of instructions given        ______________________________                                 ___________________    Patient/Family/ Caregiver Signature                                                       Date/Time     ______________________________                                 ___________________    Provider Signature                                                                                        Date/Time

## 2016-05-14 NOTE — Procedures (Signed)
 Procedure Record - [facility]             Procedure Record - [facility] Summary                                                                Primary Physician:        TALBERT RANKIN PAC    Case Number:              [id number]    Finalized Date/Time:      05/14/16 12:41:04    Pt. Name:                 Christine [name], Christine [name]    D.O.B./Sex:               [birth year]    Female    Med Rec #:                [id number]    Physician:                TALBERT RANKIN PAC    Financial #:              [id number]    Pt. Type:                 O    Room/Bed:                 /    Admit/Disch:              05/14/16 10:29:00 -    Institution:       [facility] - Case Times                                                                                                        Entry 1                                                                                                          Patient      In Room Time  05/14/16 12:15:00               Out Room Time                   05/14/16 12:41:00    Anesthesia     Procedure      Start Time               05/14/16 12:21:00               Stop Time                       05/14/16 12:37:00    Last Modified By:         TIDWELL, RN, VANESSA A                              05/14/16 12:40:46      [facility] - Case Times Audit                                                                         05/14/16 12:40:46         Owner: JAUNITA                             Modifier: COAKLEYV                                                      <+> 1         Out Room Time     05/14/16 12:37:46         Owner: JAUNITA                             Modifier: COAKLEYV                                                      <+> 1         Stop Time     05/14/16 12:31:32         Owner: JAUNITA                             Modifier: COAKLEYV                                                      <+> 1         Start Time        [facility] - Case Attendance  Entry 1                         Entry 2                         Entry 3                                          Case Attendee             SHORES-MD,  NATHAN              TIDWELL, RN, VANESSA A          SMITH,  ROSALIND                              JOSEPH    Role Performed            Surgeon Primary                 Circulator                      Endoscopy Technician    Time In                   05/14/16 12:15:00               05/14/16 12:15:00               05/14/16 12:15:00    Time Out                  05/14/16 12:41:00               05/14/16 12:41:00               05/14/16 12:41:00    Procedure                 Colonoscopy with Fecal          Colonoscopy with Fecal          Colonoscopy with Fecal                              Transplant                      Transplant                      Transplant    Last Modified By:         MICKIEL RN, VANESSA A          TIDWELL, RN, VANESSA A          TIDWELL, RN, VANESSA A                              05/14/16 12:40:49               05/14/16 12:40:49               05/14/16 12:40:49  Entry 4                         Entry 5                                                                          Case Attendee             Junior,  Davon                  SOKEVITZ-MD,  DAVID JR    Role Performed            [place]                        Anesthesiologist    Time In                   05/14/16 12:15:00               05/14/16 12:15:00    Time Out                  05/14/16 12:41:00               05/14/16 12:41:00    Procedure                 Colonoscopy with Fecal          Colonoscopy with Fecal                              Transplant                      Transplant    Last Modified By:         MICKIEL RN, VANESSA A          TIDWELL, RN, VANESSA A                              05/14/16 12:40:49               05/14/16 12:40:49      [facility] - Case Attendance Audit                                                                     05/14/16 12:40:49         Owner: JAUNITA                             Modifier: COAKLEYV  1     <+> Time Out            1     <*> Procedure                              Colonoscopy with Fecal Transplant            2     <+> Time Out            2     <*> Procedure                              Colonoscopy with Fecal Transplant            3     <+> Time Out            3     <*> Procedure                              Colonoscopy with Fecal Transplant            4     <+> Time Out            4     <*> Procedure                              Colonoscopy with Fecal Transplant            5     <+> Time Out            5     <*> Procedure                              Colonoscopy with Fecal Transplant     05/14/16 12:18:26         Owner: COAKLEYV                             Modifier: COAKLEYV                                                          5     <+> Case Attendee            5     <*> Procedure                              Colonoscopy with Fecal Transplant     05/14/16 12:16:39         Owner: COAKLEYV                             Modifier: COAKLEYV  1     <*> Procedure                              Colonoscopy with Fecal Transplant            2     <+> Time In            2     <*> Procedure                              Colonoscopy with Fecal Transplant            3     <+> Time In            3     <*> Procedure                              Colonoscopy with Fecal Transplant            4     <+> Time In            4     <*> Procedure                              Colonoscopy with Fecal Transplant            5     <+> Time In            5     <*> Procedure                              Colonoscopy with Fecal Transplant     05/14/16 12:16:09         Owner: COAKLEYV                             Modifier: COAKLEYV                                                           1     <+> Time In            1     <*> Procedure                              Colonoscopy with Fecal Transplant        <+> 2         Case Attendee        <+> 2         Role Performed        <+> 2         Procedure        <+> 3         Case Attendee        <+> 3         Role Performed        <+> 3         Procedure        <+>  4         Case Attendee        <+> 4         Role Performed        <+> 4         Procedure        <+> 5         Role Performed        <+> 5         Procedure        [facility] - Patient Verification                                                                                              Entry 1                                                                                                          Patient Identity          ID band check, Patient          History/Physical on             Yes    Verified (select at       participation                   Chart     least 2):     Procedure Consent         Yes                             Site marked with                n/a    on Chart                                                  Initials or                                                               Radiological  guidance     Laterality Verified       n/a    Last Modified By:         MICKIEL RN, VANESSA A                              05/14/16 12:17:52      [facility] - Patient Positioning                                                                     Pre-Care Text:            A.240 Assesses baseline skin condition A.280 Identifies baseline musculoskeletal status A.280.1 Identifies           physical alterations that require additional precautions for procedure-specific positioning A.510.8 Maintains           patient's dignity and privacy Im.120 Implements protective measures to prevent skin/tissue injury due to           mechanical sources Im.40 Positions the patient Im.80 Applies safety devices                              Entry 1                                                                                                           Procedure                 Colonoscopy with Fecal          Body Position                   Lateral Right Up                              Transplant    Left Arm Position         Resting at Side                 Right Arm Position              Resting at Side    Left Leg Position         Extended                        Right Leg Position              Flexed    Feet Uncrossed            Yes  Pressure Points                 Yes                                                              Checked     Positioned By             CLAUDENE SCHWAB                Outcome Met (O.80)              Yes    Last Modified By:         MICKIEL RN, VANESSA A                              05/14/16 12:17:19    Post-Care Text:            E.10 Evaluates for signs and symptoms of physical injury to skin and tissue E.290 Evaluates musculoskeletal           status O.80 Patient is free from signs and symptoms of injury related to positioning O.120 the patient is free           from signs and symptoms of injury related to transfer/transport  O.250 Patient's musculoskeletal status is           maintained at or improved from baseline levels      [facility] - Skin Assessment                                                                         Pre-Care Text:            A.240 Assesses baseline skin condition Im.120 Implements protective measures to prevent skin or tissue injury           due to mechanical sources  Im.280.1 Implements progective measures to prevent skin or tissue injury due to           thermal sources Im.360 Monitors for signs and symptons of infection                              Entry 1                                                                                                          Skin Integrity            Intact    Last Modified By:  TIDWELL, RN, VANESSA A                              05/14/16  12:17:34    Post-Care Text:            E.10 Evaluates for signs and symptoms of physical injury to skin and tissue E.270 Evaluate tissue perfusion           O.60 Patient is free from signs and symptoms of injury caused by extraneous objects   O.210 Patinet's tissue           perfusion is consistent with or improved from baseline levels      [facility] - Procedure Time Out                                                                      Pre-Care Text:            A.10 Confirms patient identity A.20 Verifies operative procedure, surgical site, and laterality A.20.1 Verifies           consent for planned procedure A.30 Verifies allergies                              Entry 1                                                                                                          Surgical/Procedure        Yes                             Time Out Complete               05/14/16 12:19:00    Team confirms     correct patient,     correct site and     correct procedure     Last Modified By:         TIDWELL, RN, VANESSA A                              05/14/16 12:19:31    Post-Care Text:            E.30 Evaluates verification process for correct patient, site, side, and level surgery      [facility] - General Case Data  Entry 1                                                                                                          Case Information      ASA Class                3                               Case Level                      Level 1     OR                       [facility] 02                      Specialty                       Gastroenterology (SN)     Wound Class              No Incision    Preop Diagnosis           C DIFF COLITIS    Last Modified By:         MICKIEL RN, VANESSA A                              05/14/16 12:16:32      [facility] - Procedures                                                                                                         Entry 1                                                                                                          Procedure     Description      Procedure                Colonoscopy with Fecal  Surgical Procedure              COLON W/ FECAL                              Transplant                      Text                            TRANSPLANT    Primary Procedure         Yes                             Primary Surgeon                 SHORES-MD,  NATHAN                                                                                              [facility]    Start                     05/14/16 12:21:00               Stop                            05/14/16 12:37:00    Anesthesia Type           Monitored Anesthesia            Surgical Service                Gastroenterology (SN)                              Care    Wound Class               No Incision    Last Modified By:         TIDWELL, RN, VANESSA A                              05/14/16 12:37:47      [facility] - Procedures Audit                                                                         05/14/16 12:37:47         Owner: JAUNITA                             Modifier: COAKLEYV                                                      <+>  1         Stop     05/14/16 12:31:36         Owner: COAKLEYV                             Modifier: COAKLEYV                                                      <+> 1         Start        [facility] - Lower Endoscopy Detail                                                                                            Entry 1                                                                                                          Colonoscopy     Completion Details      Cecum Reached            Yes                             Cecum Reached                   05/14/16 12:32:00     Withdrawal Time          05/14/16 12:37:00    Hemorrhoid     Treatment Details     Last Modified By:         MICKIEL RN, VANESSA A                               05/14/16 12:41:00      [facility] - Lower Endoscopy Detail Audit                                                             05/14/16 12:41:00         Owner: JAUNITA                             Modifier: COAKLEYV                                                      <+>  1         Withdrawal Time        [facility] - Transfer                                                                                                          Entry 1                                                                                                          Transferred By            MICKIEL, RN, VANESSA A,         Via                             Stretcher                              SOKEVITZ-MD,  DAVID JR    Post-op Destination       Other/See Comments    Skin Assessment      Condition                Intact    Last Modified By:         MICKIEL RN, VANESSA A                              05/14/16 12:19:08    General Comments:            Report to in endo H&R nurse      [facility] - Transfer Audit                                                                           05/14/16 12:19:08         Owner: JAUNITA                             Modifier: COAKLEYV  1     <*> Transferred By                         MICKIEL, RN, VANESSA A            1     <*> Transferred By                         MICKIEL RN, VANESSA A            1     <*> Transferred By                         TIDWELL, RN, VANESSA A            1     <*> Post-op Destination                    PACU            1     <*> Post-op Destination                    PACU        [facility] - Implants/Endoscopy Stents                                                               Pre-Care Text:            A.20 Verifies operative procedure, surgical site, and laterality A.20.1 Verifies consent for planned procedure           Im.350 Records implants inserted during the operative or invasive procedure                              Entry 1                                                                                                           Implant/Explant           Implant                         Catalog #                      [id number]    Implant     Identification      Description              PREPARATION FECAL               Expiration Date                 09/09/16  MICROBIOTA COLONOSCOPIC                                  Lot Number               [id number]                    Manufacturer                    [employer]    Usage Data      Implant Site             Terminal ileum                  Quantity                        1    Last Modified By:         MICKIEL RN, VANESSA A                              05/14/16 12:35:53    Post-Care Text:            E.30 Evaluates verification process for correct patient, site, side and level surgery O.30 Patient's procedure           is performed on the correct site, side, and level      [facility] - Implants/Endoscopy Stents Audit                                                          05/14/16 12:35:53         Owner: COAKLEYV                             Modifier: COAKLEYV                                                          1     <*> Description                            PREPARATION FECAL MI[employer]BI[employer] COLONOSCOPIC            1     <*> Implant Site                           colon        Case Comments                                                                                         <  None>              Finalized By: MICKIEL RN, VANESSA A      Document Signatures                                                                             Signed By:           TIDWELL, RN, VANESSA A 05/14/16 12:41

## 2016-05-14 NOTE — H&P (Signed)
Colon. FMT        Patient:   Christine [name], Christine [name]             MRN: [id number]            FIN: [id number]               Age:   82 years     Sex:  Female     DOB:  Jun 25, 1934   Associated Diagnoses:   Diarrhea; C. difficile diarrhea   Author:   Laren Boom      Preoperative Information   Diarrhea from recurrent C diff      Chief Complaint   Diarrhea   + C diff      Review of Systems   Constitutional:  No fever.    Eye:  deferred.    Ear/Nose/Mouth/Throat:  deferred.    Respiratory:  No shortness of breath, No cough.    Cardiovascular:  No chest pain, No palpitations, No bradycardia, No tachycardia.    Gastrointestinal:  No nausea, No vomiting, No constipation.    Genitourinary:  deferred.    Hematology/Lymphatics:  deferred.    Endocrine:  deferred.    Immunologic:  deferred.    Musculoskeletal:  deferred.    Integumentary:  deferred.    Neurologic:  deferred.    Psychiatric:  deferred.    deferred      Health Status   Allergies:    Allergic Reactions (Selected)  No Known Allergies,    Allergies (1) Active Reaction  No Known Allergies None Documented     Current medications: None,  (Selected)   Inpatient Medications  Ordered  Lactated Ringers Injection 1,000 mL: 30 mL/hr, IV  Sodium Chloride 0.9% 1,000 mL: 30 mL/hr, IV  Documented Medications  Documented  Ampyra 10 mg oral tablet, extended release: 10 mg, 1 tabs, Oral, q12hr, 30 tabs, 0 Refill(s)  Anusol-HC 25 mg rectal suppository: 25 mg, 1 supp, PR, BID, PRN: hemorrhoid discomfort, 0 Refill(s)  Bacid: 1 tabs, Oral, BID, 0 Refill(s)  LORazepam 1 mg oral tablet: 1 mg, 1 tabs, Oral, At Bedtime (Once a Day), PRN: insomnia, 0 Refill(s)  MiraLax: 34 g, 2 packets, Oral, Daily, 0 Refill(s)  Senna 8.6 mg oral tablet: 17.2 mg, 2 tabs, Oral, Once a Day (at bedtime), PRN, 100 tabs, 0 Refill(s)  Tylenol 325 mg oral tablet: 650 mg, 2 tabs, Oral, q4hr, PRN: mild pain (1-3), 0 Refill(s)  Tylenol: 500 mg, Oral, At Bedtime (Once a Day), 0 Refill(s)  Vitamin D3 1000 intl units  oral tablet: 2,000 International_unit, 2 tabs, Oral, Daily, 30 tabs, 0 Refill(s)  Zofran 4 mg oral tablet: 4 mg, 1 tabs, Oral, q6hr, PRN: as needed for nausea/vomiting, 0 Refill(s)  aspirin 81 mg oral delayed release tablet: 81 mg, 1 tabs, Oral, Daily, 0 Refill(s)  baclofen 10 mg oral tablet: 5 mg, 0.5 tabs, Oral, BID, 30 tabs, 0 Refill(s)  bisacodyl 10 mg rectal suppository: 10 mg, 1 supp, PR, Daily, PRN: constipation, 0 Refill(s)  calcium carbonate 500 mg (200 mg elemental calcium) oral tablet, chewable: 500 mg, 1 tabs, Chewed, q6hr, PRN: indigestion, 30 tabs, 0 Refill(s)  conjugated estrogens 0.625 mg/g vaginal cream with applicator: See Instructions, 1 g VAG SUN and THU, 0 Refill(s)  fosfomycin 3 g oral powder for reconstitution: 3 g, 1 packets, Oral, q3day, 0 Refill(s)  fosfomycin 3 g oral powder for reconstitution: 3 g, 1 packets, Oral, q72hr, Give 1 packet by mouth on 04/27/16 and again on  04/30/16., 2, 0 Refill(s)  gabapentin 300 mg oral capsule: 300 mg, 1 caps, Oral, TID, 0 Refill(s)  heparin 5000 units/mL injectable solution: Subcutaneous, q8hr, 0 Refill(s)  hydrocortisone 1% topical cream: 1 app, Topical, BID, 0 Refill(s)  levothyroxine: 0.05 mg, Oral, Daily, 0 Refill(s)  lidocaine 5% topical film: Topical, q24hr, PRN: mild pain (1-3), 0 Refill(s)  lidocaine-hydrocortisone 3%-0.5% rectal cream: 1 app, Topical, BID, 0 Refill(s)  melatonin 3 mg oral tablet: 3 mg, 1 tabs, Oral, Once a Day (at bedtime), PRN: for insomnia, 60 tabs, 0 Refill(s)  methenamine hippurate 1 g oral tablet: 1 g, 1 tabs, Oral, BID, for 7 days, 14 tabs, 0 Refill(s)  oxybutynin 5 mg/24 hours oral tablet, extended release: 5 mg, 1 tabs, Oral, Daily, 30 tabs, 0 Refill(s)  pantoprazole 40 mg oral delayed release tablet: 40 mg, 1 tabs, Oral, Daily, 0 Refill(s)  rosuvastatin 20 mg oral tablet: 20 mg, 1 tabs, Oral, Daily, 0 Refill(s)  saccharomyces boulardii lyo 250 mg oral capsule: 250 mg, 1 caps, Oral, BID, PRN: for loose stool, 10 caps, 0  Refill(s)  simethicone 125 mg oral tablet, chewable: 125 mg, 1 tabs, Chewed, TID, 48 tabs, 0 Refill(s)  traMADol 50 mg oral tablet: 25 mg, 0.5 tabs, Oral, q6hr, PRN, 30 EA, 1 Refill(s)  vancomycin: 125 mg, 1.25 mL, Oral, BID, 0 Refill(s),    Home Medications (32) Active  Ampyra 10 mg oral tablet, extended release 10 mg = 1 tabs, Oral, q12hr  Anusol-HC 25 mg rectal suppository 25 mg = 1 supp, PRN, PR, BID  aspirin 81 mg oral delayed release tablet 81 mg = 1 tabs, Oral, Daily  Bacid 1 tabs, Oral, BID  baclofen 10 mg oral tablet 5 mg = 0.5 tabs, Oral, BID  bisacodyl 10 mg rectal suppository 10 mg = 1 supp, PRN, PR, Daily  calcium carbonate 500 mg (200 mg elemental calcium) oral tablet, chewable 500 mg = 1 tabs, PRN, Chewed, q6hr  conjugated estrogens 0.625 mg/g vaginal cream with applicator See Instructions  fosfomycin 3 g oral powder for reconstitution 3 g = 1 packets, Oral, q72hr  fosfomycin 3 g oral powder for reconstitution 3 g = 1 packets, Oral, q3day  gabapentin 300 mg oral capsule 300 mg = 1 caps, Oral, TID  heparin 5000 units/mL injectable solution , Subcutaneous, q8hr  hydrocortisone 1% topical cream 1 app, Topical, BID  levothyroxine 0.05 mg, Oral, Daily  lidocaine 5% topical film , PRN, Topical, q24hr  lidocaine-hydrocortisone 3%-0.5% rectal cream 1 app, Topical, BID  LORazepam 1 mg oral tablet 1 mg = 1 tabs, PRN, Oral, At Bedtime (Once a Day)  melatonin 3 mg oral tablet 3 mg = 1 tabs, PRN, Oral, Once a Day (at bedtime)  methenamine hippurate 1 g oral tablet 1 g = 1 tabs, Oral, BID  MiraLax 34 g = 2 packets, Oral, Daily  oxybutynin 5 mg/24 hours oral tablet, extended release 5 mg = 1 tabs, Oral, Daily  pantoprazole 40 mg oral delayed release tablet 40 mg = 1 tabs, Oral, Daily  rosuvastatin 20 mg oral tablet 20 mg = 1 tabs, Oral, Daily  saccharomyces boulardii lyo 250 mg oral capsule 250 mg = 1 caps, PRN, Oral, BID  Senna 8.6 mg oral tablet 17.2 mg = 2 tabs, PRN, Oral, Once a Day (at bedtime)  simethicone 125  mg oral tablet, chewable 125 mg = 1 tabs, Chewed, TID  traMADol 50 mg oral tablet 25 mg = 0.5 tabs, PRN, Oral, q6hr  Tylenol 500 mg,  Oral, At Bedtime (Once a Day)  Tylenol 325 mg oral tablet 650 mg = 2 tabs, PRN, Oral, q4hr  vancomycin 125 mg = 1.25 mL, Oral, BID  Vitamin D3 1000 intl units oral tablet 2,000 International_unit = 2 tabs, Oral, Daily  Zofran 4 mg oral tablet 4 mg = 1 tabs, PRN, Oral, q6hr  ,    Medications (2) Active  Scheduled: (0)  Continuous: (2)  Lactated Ringers Injection solution 1,000 mL  1,000 mL, IV, 30 mL/hr  Sodium Chloride 0.9% intravenous solution 1,000 mL  1,000 mL, IV, 30 mL/hr  PRN: (0)     Problem list:    Patient Stated  C diff GI infection / [id number] / Confirmed  Hyperlipidemia / 16109604 / Confirmed  Hypothyroidism / [id number] / Confirmed  Multiple sclerosis / [id number] / Confirmed  Neurogenic bladder / [id number] / Confirmed  Osteoporosis / [id number] / Confirmed  Chronic venous insufficiency / 13244010 / Confirmed  UTI (urinary tract infection) / [id number] / Confirmed,    Active Problems (9)  C diff GI infection   Chronic venous insufficiency   Hyperlipidemia   Hypothyroidism   Impaired tissue integrity   Multiple sclerosis   Neurogenic bladder   Osteoporosis   UTI (urinary tract infection)         Histories   Past Medical History:    No active or resolved past medical history items have been selected or recorded.   Family History:    No family history items have been selected or recorded.   Procedure history:    ulnar nerve surgery right hand.  Comments:  03/29/2016 22:28 - SIOSON, RN, MARIA NORLIS  5 yrs ago  bladder stimulator.  Colonoscopy ([id number]).   Social History        Social & Psychosocial Habits    Alcohol  05/14/2016  Use: Denies    Type: Wine    Frequency: 1-2 times per week    Substance Abuse  05/14/2016  Use: Denies    Tobacco  05/14/2016  Use: Never smoker  .        Physical Examination   Vital Signs   05/14/2016 11:28 EST  Temperature Oral 37.1 degC    Heart Rate Monitored 76 bpm    Respiratory Rate 17 br/min    Systolic Blood Pressure 134 mmHg    Diastolic Blood Pressure 74 mmHg    SpO2 98 %         Vital Signs (last 24 hrs)_____  Last Charted___________  Temp Oral     37.1 degC  (NOV 07 11:28)  Resp Rate         17 br/min  (NOV 07 11:28)  SBP      134 mmHg  (NOV 07 11:28)  DBP      74 mmHg  (NOV 07 11:28)  SpO2      98 %  (NOV 07 11:28)  Weight      57 kg  (NOV 07 11:28)  Height      157.5 cm  (NOV 07 11:28)     Measurements from flowsheet : Measurements   05/14/2016 11:29 EST Height/Length Estimated 157.5 cm    Weight Estimated 57 kg   05/14/2016 11:28 EST Height/Length Measured 157.5 cm    Weight Measured 57 kg   05/14/2016 11:27 EST Height/Length Estimated 157.5 cm    Weight Estimated 57 kg      General:  Alert and oriented, No acute distress.    Respiratory:  Lungs are  clear to auscultation.    Cardiovascular:  Normal rate, Regular rhythm.    Gastrointestinal:  Soft, Non-tender, Non-distended.    Psychiatric:  Cooperative, Appropriate mood & affect.       Health Maintenance      Health Maintenance     Pending (in the next year)     There are no current recommendations pending     Satisfied (in the past 1 year)     There are no satisfied recommendations within the defined date range          Review / Management   Results review:     No qualifying data available, Lab results: 05/14/2016 11:31 EST      Estimated Creatinine Clearance            120.31 mL/min  .       Impression and Plan   Diagnosis     Diarrhea (ICD10-CM R19.7, Discharge, Medical).     C. difficile diarrhea (ICD10-CM A04.72, Discharge, Medical).     Condition:  Stable.    Diagnosis   Course:  Progressing as expected.    Counseled:  Patient.     colonoscopy + FMT   Signature Line     Electronically Signed on 05/14/2016 12:13 PM EST   ________________________________________________   Laren BoomSHORES-MD,  Salim Forero JOSEPH

## 2016-05-14 NOTE — Discharge Summary (Signed)
 Inpatient Clinical Summary             [facility]  Post-Acute Care Transfer Instructions  PERSON INFORMATION   Name: Christine [name], Christine [name]   MRN: [id number]    FIN#: [id number]   PHYSICIANS  Admitting Physician: TALBERT RANKIN PAC  Attending Physician: TALBERT RANKIN PAC   PCP: Pcp,  Unknown  Discharge Diagnosis: C. difficile diarrhea; Diarrhea  Comment:       PATIENT EDUCATION INFORMATION  Instructions:             Anesthesia: Monitored Anesthesia Care (MAC) short (DIETSA); Colonoscopy  Medication Leaflets:               Follow-up:                                   MEDICATION LIST  Medication Reconciliation at Discharge:         Medications That Have Not Changed  Other Medications  acetaminophen (Tylenol 325 mg oral tablet) 2 Tabs Oral (given by mouth) every 4 hours as needed mild pain (1-3).  Last Dose:____________________  acetaminophen (Tylenol) 500 Milligram Oral (given by mouth) At Bedtime (Once a Day)., MAX DAILY DOSE OF ACETAMINOPHEN = 3000 MG  Last Dose:____________________  aspirin (aspirin 81 mg oral delayed release tablet) 1 Tabs Oral (given by mouth) every day.  Last Dose:____________________  baclofen (baclofen 10 mg oral tablet) 0.5 Tabs Oral (given by mouth) 2 times a day.  Last Dose:____________________  bisacodyl (bisacodyl 10 mg rectal suppository) 1 Suppositories Per rectum (in the rectum) every day as needed constipation.  Last Dose:____________________  calcium carbonate (calcium carbonate 500 mg (200 mg elemental calcium) oral tablet, chewable) 1 Tabs Chewed every 6 hours as needed indigestion.,  NOTE 1250MG  OF CALCIUM CARBONATE IS EQUIVALENT TO 500MG  OF ELEMENTAL CALCIUM  Last Dose:____________________  cholecalciferol (Vitamin D3 1000 intl units oral tablet) 2 Tabs Oral (given by mouth) every day.  Last Dose:____________________  conjugated estrogens topical (conjugated estrogens 0.625 mg/g vaginal cream with applicator) 1 g VAG SUN and THU.  Last  Dose:____________________  dalfampridine (Ampyra 10 mg oral tablet, extended release) 1 Tabs Oral (given by mouth) every 12 hours.  Last Dose:____________________  fosfomycin (fosfomycin 3 g oral powder for reconstitution) 1 Packets Oral (given by mouth) every 72 hours. Give 1 packet by mouth on 04/27/16 and again on 04/30/16..  Last Dose:____________________  fosfomycin (fosfomycin 3 g oral powder for reconstitution) 1 Packets Oral (given by mouth) every 3 days.  Last Dose:____________________  gabapentin (gabapentin 300 mg oral capsule) 1 Capsules Oral (given by mouth) 3 times a day.  Last Dose:____________________  heparin (heparin 5000 units/mL injectable solution) Subcutaneous (under the skin) every 8 hours.  Last Dose:____________________  hydrocortisone topical (Anusol-HC 25 mg rectal suppository) 1 Suppositories Per rectum (in the rectum) 2 times a day as needed hemorrhoid discomfort.  Last Dose:____________________  hydrocortisone topical (hydrocortisone 1% topical cream) 1 Application Topical (on the skin) 2 times a day.  Last Dose:____________________  lactobacillus acidophilus (Bacid) 1 Tabs Oral (given by mouth) 2 times a day.  Last Dose:____________________  levothyroxine 0.05 Milligram Oral (given by mouth) every day.  Last Dose:____________________  lidocaine topical (lidocaine 5% topical film) Topical (on the skin) every 24 hours as needed mild pain (1-3).  Last Dose:____________________  lidocaine-hydrocortisone topical (lidocaine-hydrocortisone 3%-0.5% rectal cream) 1 Application Topical (on the skin) 2 times a day.  Last Dose:____________________  LORazepam (  LORazepam 1 mg oral tablet) 1 Tabs Oral (given by mouth) At Bedtime (Once a Day) as needed insomnia.  Last Dose:____________________  melatonin (melatonin 3 mg oral tablet) 1 Tabs Oral (given by mouth) Once a Day (at bedtime) as needed for insomnia.  Last Dose:____________________  methenamine (methenamine hippurate 1 g oral tablet) 1 Tabs  Oral (given by mouth) 2 times a day for 7 Days.  Last Dose:____________________  ondansetron (Zofran 4 mg oral tablet) 1 Tabs Oral (given by mouth) every 6 hours as needed as needed for nausea/vomiting.  Last Dose:____________________  oxybutynin (oxybutynin 5 mg/24 hours oral tablet, extended release) 5 Milligram Oral (given by mouth) every day.  Last Dose:____________________  pantoprazole (pantoprazole 40 mg oral delayed release tablet) 1 Tabs Oral (given by mouth) every day.  Last Dose:____________________  polyethylene glycol 3350 (MiraLax) 34 Gram Oral (given by mouth) every day.  Last Dose:____________________  rosuvastatin (rosuvastatin 20 mg oral tablet) 1 Tabs Oral (given by mouth) every day.  Last Dose:____________________  saccharomyces boulardii lyo (saccharomyces boulardii lyo 250 mg oral capsule) 1 Capsules Oral (given by mouth) 2 times a day as needed for loose stool.  Last Dose:____________________  senna (Senna 8.6 mg oral tablet) 2 Tabs Oral (given by mouth) Once a Day (at bedtime) as needed.  Last Dose:____________________  simethicone (simethicone 125 mg oral tablet, chewable) 1 Tabs Chewed 3 times a day.  Last Dose:____________________  traMADol (traMADol 50 mg oral tablet) 0.5 Tabs Oral (given by mouth) every 6 hours as needed.  Last Dose:____________________  These Medications Were Removed and Should No Longer Be Taken  vancomycin 125 Milligram Oral (given by mouth) 2 times a day.  Stop Taking Reason: Physician Request         Patient's Final Home Medication List Upon Discharge:          acetaminophen (Tylenol 325 mg oral tablet) 2 Tabs Oral (given by mouth) every 4 hours as needed mild pain (1-3).  acetaminophen (Tylenol) 500 Milligram Oral (given by mouth) At Bedtime (Once a Day)., MAX DAILY DOSE OF ACETAMINOPHEN = 3000 MG  aspirin (aspirin 81 mg oral delayed release tablet) 1 Tabs Oral (given by mouth) every day.  baclofen (baclofen 10 mg oral tablet) 0.5 Tabs Oral (given by mouth) 2 times  a day.  bisacodyl (bisacodyl 10 mg rectal suppository) 1 Suppositories Per rectum (in the rectum) every day as needed constipation.  calcium carbonate (calcium carbonate 500 mg (200 mg elemental calcium) oral tablet, chewable) 1 Tabs Chewed every 6 hours as needed indigestion.,  NOTE 1250MG  OF CALCIUM CARBONATE IS EQUIVALENT TO 500MG  OF ELEMENTAL CALCIUM  cholecalciferol (Vitamin D3 1000 intl units oral tablet) 2 Tabs Oral (given by mouth) every day.  conjugated estrogens topical (conjugated estrogens 0.625 mg/g vaginal cream with applicator) 1 g VAG SUN and THU.  dalfampridine (Ampyra 10 mg oral tablet, extended release) 1 Tabs Oral (given by mouth) every 12 hours.  fosfomycin (fosfomycin 3 g oral powder for reconstitution) 1 Packets Oral (given by mouth) every 72 hours. Give 1 packet by mouth on 04/27/16 and again on 04/30/16..  fosfomycin (fosfomycin 3 g oral powder for reconstitution) 1 Packets Oral (given by mouth) every 3 days.  gabapentin (gabapentin 300 mg oral capsule) 1 Capsules Oral (given by mouth) 3 times a day.  heparin (heparin 5000 units/mL injectable solution) Subcutaneous (under the skin) every 8 hours.  hydrocortisone topical (Anusol-HC 25 mg rectal suppository) 1 Suppositories Per rectum (in the rectum) 2 times a day as needed  hemorrhoid discomfort.  hydrocortisone topical (hydrocortisone 1% topical cream) 1 Application Topical (on the skin) 2 times a day.  lactobacillus acidophilus (Bacid) 1 Tabs Oral (given by mouth) 2 times a day.  levothyroxine 0.05 Milligram Oral (given by mouth) every day.  lidocaine topical (lidocaine 5% topical film) Topical (on the skin) every 24 hours as needed mild pain (1-3).  lidocaine-hydrocortisone topical (lidocaine-hydrocortisone 3%-0.5% rectal cream) 1 Application Topical (on the skin) 2 times a day.  LORazepam (LORazepam 1 mg oral tablet) 1 Tabs Oral (given by mouth) At Bedtime (Once a Day) as needed insomnia.  melatonin (melatonin 3 mg oral tablet) 1 Tabs  Oral (given by mouth) Once a Day (at bedtime) as needed for insomnia.  methenamine (methenamine hippurate 1 g oral tablet) 1 Tabs Oral (given by mouth) 2 times a day for 7 Days.  ondansetron (Zofran 4 mg oral tablet) 1 Tabs Oral (given by mouth) every 6 hours as needed as needed for nausea/vomiting.  oxybutynin (oxybutynin 5 mg/24 hours oral tablet, extended release) 5 Milligram Oral (given by mouth) every day.  pantoprazole (pantoprazole 40 mg oral delayed release tablet) 1 Tabs Oral (given by mouth) every day.  polyethylene glycol 3350 (MiraLax) 34 Gram Oral (given by mouth) every day.  rosuvastatin (rosuvastatin 20 mg oral tablet) 1 Tabs Oral (given by mouth) every day.  saccharomyces boulardii lyo (saccharomyces boulardii lyo 250 mg oral capsule) 1 Capsules Oral (given by mouth) 2 times a day as needed for loose stool.  senna (Senna 8.6 mg oral tablet) 2 Tabs Oral (given by mouth) Once a Day (at bedtime) as needed.  simethicone (simethicone 125 mg oral tablet, chewable) 1 Tabs Chewed 3 times a day.  traMADol (traMADol 50 mg oral tablet) 0.5 Tabs Oral (given by mouth) every 6 hours as needed.         Comment:       ORDERS         Order Name Order Details   Discharge Patient 05/14/16 12:42:00 EST, Discharge Home/Self Care, Follow up to be arranged   Discharge Special Instructions Lay right side down for 45 minutes. Do not eat until 6 pm. Try to avoid first BM as long as possible.

## 2016-06-17 DIAGNOSIS — N3289 Other specified disorders of bladder: Secondary | ICD-10-CM | POA: Insufficient documentation

## 2016-07-08 HISTORY — PX: BLADDER SURGERY: SHX569

## 2019-09-28 ENCOUNTER — Ambulatory Visit: Payer: Medicare Other | Admitting: Neurology

## 2019-10-06 DIAGNOSIS — M810 Age-related osteoporosis without current pathological fracture: Secondary | ICD-10-CM | POA: Insufficient documentation

## 2019-10-06 DIAGNOSIS — M21379 Foot drop, unspecified foot: Secondary | ICD-10-CM | POA: Insufficient documentation

## 2019-11-02 ENCOUNTER — Encounter: Payer: Self-pay | Admitting: Neurology

## 2019-11-02 ENCOUNTER — Other Ambulatory Visit: Payer: Self-pay

## 2019-11-02 ENCOUNTER — Ambulatory Visit (INDEPENDENT_AMBULATORY_CARE_PROVIDER_SITE_OTHER): Payer: Medicare Other | Admitting: Neurology

## 2019-11-02 VITALS — BP 122/61 | HR 71 | Temp 97.4°F | Ht 62.0 in | Wt 117.5 lb

## 2019-11-02 DIAGNOSIS — R269 Unspecified abnormalities of gait and mobility: Secondary | ICD-10-CM

## 2019-11-02 DIAGNOSIS — N3941 Urge incontinence: Secondary | ICD-10-CM | POA: Diagnosis not present

## 2019-11-02 DIAGNOSIS — R531 Weakness: Secondary | ICD-10-CM | POA: Diagnosis not present

## 2019-11-02 DIAGNOSIS — G35 Multiple sclerosis: Secondary | ICD-10-CM

## 2019-11-02 MED ORDER — BACLOFEN 10 MG PO TABS: 5.0000 mg | ORAL_TABLET | Freq: Two times a day (BID) | ORAL | 4 refills | Status: DC

## 2019-11-02 NOTE — Progress Notes (Signed)
[facility]  PATIENT: Tina Webb DOB: February 02, 1934  REFERRING DOCTOR OR PCP:  Wilhelmina Mcardle (PCP); Huel Coventry (neurology) SOURCE: Patient, notes from Dr. Excell Seltzer, imaging reports, MRI images personally reviewed.  _________________________________   HISTORICAL  CHIEF COMPLAINT:  Chief Complaint  Patient presents with  . New Patient (Initial Visit)    RM 12 with husband, temp: 97.4 Paper referral from Deloria Lair, MD for MS. Lives at [facility] here in [place], [place]. They moved from [place], [place] about 8 weeks ago. She moved her to be closer to daughter.   . Multiple Sclerosis    She is off DMT. Previous DMT's, Tecfidera, Aubagio, Zinbryta, Rebif, Gilenya  . Urinary Incontinence    No current UTI's. She takes oxybutynin. She had bladder STEM placed, cannot have MRI's due to this.   . Gait Problem    In WC. No recent falls.  . Back Pain    Having low back pain that started in the last couple weeks    HISTORY OF PRESENT ILLNESS:  I had the pleasure seeing your patient, Tina Webb, at the MS center at [facility] for neurologic consultation regarding her multiple sclerosis.  She is an 86 year old woman who was diagnosed with MS in 1984 after presenting with numbness in both legs.  She had her husband have recently moved to the [place] area from [place].  While in [place], she was seen Dr. Huel Coventry for her multiple sclerosis.  She reports that the numbness that presented in 1984 was present in both legs.  She improved over time and did not receive any steroids at that time..  Initially, she was not on any DMT but started Copaxone in the 1990's.   She had progression and over the years was also on Rebif, Tecfidera, Aubagio, Zinbryta and Gilenya.  On over the medications, she continued to have slow worsening of her disability.   She did have some definite motor and visual exacerbations in the 1980's and 1990's.     She had a  sever UTI in 2018 and she stopped all DMTs at that time.    Reduced gait has been one of her major symptoms.  She uses a motorized wheelchair due to leg weakness, right worse than left.  She went form a walker to wheelchair about 5 years ago.   She finds a benefit with brand name Ampyra.  Generic dalfampridine did not help.  She tore a rotator cuff 5 years ago and couldn't use the walker.  After improvement of the left rotator cuff injury, she felt she was too weak to use the walker.   She has reduced function in the right arm which is also gradually worsened.  She no longer writes with the right but uses her left..   Besides MS she also had a severe ulnar neuropathy at the right wrist that required surgery.     She has some spasticity and takes baclofen.   She can self transfer from a wheelchair..   She has numbness with tingling in the arms and legs.   She has reduced vision bilaterally but no recent changes.    She has urinary dysfunction and uses oxybutynin with benefit.    She has elevated creatinine (stable) and will be getting a renal ultrasound soon.    She has a bladder stimulator (not functional) and is unable to get an MRI.   Her last MRI was around 2013.  She has worked with a Systems analyst and done  physical therapy.  This has helped her gain better ability to transfer.   Due to her urinary dysfunction, she has had multiple urinary tract infections.  Unfortunately, treatment of these was often complicated by  C. Diff and she had a fecal transplant .    She has fatigue.  She denies depression.  She denies major cognitive dysfunction.  MRI of the brain and cervical spine from 06/10/2012 was personally reviewed.  The MRI of the cervical spine showed T2 hyperintense foci adjacent to C1-C2, C4 and C7 posteriorly and adjacent to C5-C6 to the left.  Additionally there are degenerative changes at C5-C6 causing mild spinal stenosis and right greater than left foraminal narrowing.  The MRI of the brain  showed multiple T2/FLAIR hyperintense foci in the periventricular, juxtacortical and deep white matter of both hemispheres.  Additionally there is a T2 hyperintense focus anteriorly within the pontomedullary junction towards the right.  REVIEW OF SYSTEMS: Constitutional: No fevers, chills, sweats, or change in appetite.  She has fatigue.  Eyes: No visual changes, double vision, eye pain Ear, nose and throat: No hearing loss, ear pain, nasal congestion, sore throat Cardiovascular: No chest pain, palpitations Respiratory: No shortness of breath at rest or with exertion.   No wheezes GastrointestinaI: No nausea, vomiting, diarrhea, abdominal pain, fecal incontinence Genitourinary:Urinary frequency, urgency and multiple UTIs Musculoskeletal:She often has left shoulder pain.   Integumentary: No rash, pruritus, skin lesions Neurological: as above Psychiatric: No depression at this time.  No anxiety Endocrine: No palpitations, diaphoresis, change in appetite, change in weigh or increased thirst Hematologic/Lymphatic: No anemia, purpura, petechiae. Allergic/Immunologic: No itchy/runny eyes, nasal congestion, recent allergic reactions, rashes  ALLERGIES: No Known Allergies  HOME MEDICATIONS:  Current Outpatient Medications:  .  ASPIRIN 81 PO, Take 81 mg by mouth once a week., Disp: , Rfl:  .  baclofen (LIORESAL) 10 MG tablet, Take 0.5 tablets (5 mg total) by mouth 2 (two) times daily., Disp: 90 each, Rfl: 4 .  BIOTIN PO, Take 100 mg by mouth in the morning, at noon, and at bedtime., Disp: , Rfl:  .  clobetasol cream (TEMOVATE) 5.32 %, Apply 1 application topically as needed., Disp: , Rfl:  .  Cranberry-Vitamin C-Probiotic (AZO CRANBERRY PO), Take 1 tablet by mouth daily., Disp: , Rfl:  .  dalfampridine (AMPYRA) 10 MG TB12, Take 10 mg by mouth in the morning and at bedtime., Disp: , Rfl:  .  DICLOFENAC SODIUM PO, Take 100 mg by mouth as needed., Disp: , Rfl:  .  Esomeprazole Magnesium (NEXIUM  PO), Take 1 Dose by mouth as needed., Disp: , Rfl:  .  estradiol (ESTRING) 2 MG vaginal ring, Place 2 mg vaginally every 3 (three) months. follow package directions, Disp: , Rfl:  .  gabapentin (NEURONTIN) 300 MG capsule, Take 300 mg by mouth 3 (three) times daily., Disp: , Rfl:  .  hydrocortisone 2.5 % cream, Apply 1 application topically as needed., Disp: , Rfl:  .  levothyroxine (SYNTHROID) 25 MCG tablet, Take 25 mcg by mouth daily before breakfast., Disp: , Rfl:  .  liothyronine (CYTOMEL) 5 MCG tablet, Take 5 mcg by mouth in the morning and at bedtime., Disp: , Rfl:  .  methenamine (HIPREX) 1 g tablet, Take 1 g by mouth daily., Disp: , Rfl:  .  mupirocin cream (BACTROBAN) 2 %, Apply 1 application topically as needed., Disp: , Rfl:  .  nitrofurantoin (MACRODANTIN) 50 MG capsule, Take 50 mg by mouth daily., Disp: , Rfl:  .  OMEPRAZOLE PO, Take 1 Dose by mouth as needed., Disp: , Rfl:  .  oxybutynin (DITROPAN) 5 MG tablet, Take 5 mg by mouth 4 (four) times daily., Disp: , Rfl:  .  polyethylene glycol (MIRALAX) 17 g packet, Take 17 g by mouth daily., Disp: , Rfl:  .  Probiotic Product (PROBIOTIC PO), Take 2 tablets by mouth daily., Disp: , Rfl:  .  rosuvastatin (CRESTOR) 20 MG tablet, Take 20 mg by mouth daily., Disp: , Rfl:  .  triamcinolone cream (KENALOG) 0.1 %, Apply 1 application topically as needed., Disp: , Rfl:  .  VITAMIN D PO, Take 4,000 Units by mouth daily., Disp: , Rfl:   PAST MEDICAL HISTORY: Past Medical History:  Diagnosis Date  . Anxiety   . High cholesterol   . Multiple sclerosis (HCC)   . Rotator cuff tear    left    PAST SURGICAL HISTORY: Past Surgical History:  Procedure Laterality Date  . BLADDER SURGERY  2018   STEM placement   . ULNAR NERVE REPAIR Right 2010    FAMILY HISTORY: Family History  Problem Relation Age of Onset  . Heart attack Mother   . Transient ischemic attack Mother   . Arthritis Mother   . Heart Problems Father     SOCIAL  HISTORY:  Social History   Socioeconomic History  . Marital status: Married    Spouse name: [name]  . Number of children: 3  . Years of education: College  . Highest education level: Not on file  Occupational History  . Occupation: Retired  Tobacco Use  . Smoking status: Never Smoker  . Smokeless tobacco: Never Used  Substance and Sexual Activity  . Alcohol use: Yes    Comment: 2 drinks per week  . Drug use: Never  . Sexual activity: Not on file  Other Topics Concern  . Not on file  Social History Narrative   Originally right handed but had surgery and now uses left hand for eating and other activities   Caffeine use: 2 cups coffee per day   Lives with husband   Lives at [place] with husband in [place]   Social Determinants of Health   Financial Resource Strain:   . Difficulty of Paying Living Expenses:   Food Insecurity:   . Worried About Programme researcher, broadcasting/film/video in the Last Year:   . Barista in the Last Year:   Transportation Needs:   . Freight forwarder (Medical):   Marland Kitchen Lack of Transportation (Non-Medical):   Physical Activity:   . Days of Exercise per Week:   . Minutes of Exercise per Session:   Stress:   . Feeling of Stress :   Social Connections:   . Frequency of Communication with Friends and Family:   . Frequency of Social Gatherings with Friends and Family:   . Attends Religious Services:   . Active Member of Clubs or Organizations:   . Attends Banker Meetings:   Marland Kitchen Marital Status:   Intimate Partner Violence:   . Fear of Current or Ex-Partner:   . Emotionally Abused:   Marland Kitchen Physically Abused:   . Sexually Abused:      PHYSICAL EXAM  Vitals:   11/02/19 1058  BP: 122/61  Pulse: 71  Temp: (!) 97.4 F (36.3 C)  SpO2: 96%  Weight: 117 lb 8 oz (53.3 kg)  Height: 5\' 2"  (1.575 m)    Body mass index is 21.49 kg/m.   General: The  patient is well-developed and well-nourished and in no acute distress  HEENT:  Head is  [place]/AT.  Sclera are anicteric.  Funduscopic exam shows mild optic nerve pallor..  Neck: No carotid bruits are noted.  The neck is nontender.  Cardiovascular: The heart has a regular rate and rhythm with a normal S1 and S2. There was 3/6 systolic murmur radiating to the carotids   Skin: Extremities are without rash but with right > left leg edema.  Musculoskeletal:  Back is nontender.  The left shoulder is not tender currently.  Neurologic Exam  Mental status: The patient is alert and oriented x 3 at the time of the examination. The patient has apparent normal recent and remote memory, with an apparently normal attention span and concentration ability.   Speech is normal.  Cranial nerves: Extraocular movements are full. Pupils are equal, round, and reactive to light and accomodation.  Color vision is symmetric.  Facial symmetry is present. There is good facial sensation to soft touch bilaterally.Facial strength is normal.  Trapezius and sternocleidomastoid strength is normal. No dysarthria is noted.  The tongue is midline, and the patient has symmetric elevation of the soft palate. No obvious hearing deficits are noted.  Motor:  Muscle bulk is reduced in the ulnar innervated hand muscles bilaterally, right worse than left.   Tone is increased in the right leg.  . Strength is 3/5 in the ulnar innervated hand muscles on the right.  Proximal muscles on the right were 4/5.  In the left arm, strength was 4 to 4+ /5.  In the legs, strength was 2+/5 in the lefthip flexors and 4 -/5 in the quadriceps and 3/5 elsewhere in the leg.  On the right, strength was 2/5 in the hip flexor and 2+/5 in the quadriceps and 2 to 2+elsewhere   Sensory: Sensory testing is intact to pinprick, soft touch and vibration sensation in all 4 extremities.  Coordination: Cerebellar testing reveals good finger-nose-finger and she can't do heel-to-shin bilaterally.  Gait and station: She cannot stand or walk  Reflexes: Deep  tendon reflexes are increase in legs, right > left.          ASSESSMENT AND PLAN  Multiple sclerosis (HCC)  Gait disorder  Weakness  Urge incontinence of urine  In summary, Ms. Oestreich is an 86 year old woman with multiple sclerosis diagnosed in 1984.  She is wheelchair-bound and has left greater than right weakness with superimposed ulnar weakness in the right hand.  Strength has received some benefit from brand name Ampyra but she did not receive a benefit from generic dalfampridine.  She is not on any disease modifying therapy.  Due to risks being higher with age and benefit being less with age and duration of MS, I feel this is a proper choice.  She feels that high-dose biotin has had some benefit and we will continue this.  She has edema in the right leg.  She also has a sore in the right shin.  She asked about an orthotic brace but due to the edema and sore the risks are higher than the benefits.  She will return to see me in 6 months or sooner if there are new or worsening neurologic symptoms.  Thank you for asking me to see Mrs. Pistocci.  Please let me know when we have further assistance with her or other patients in the future.  Tatum Corl A. Epimenio Foot, MD, [facility] 11/02/2019, 6:23 PM Certified in Neurology, Clinical Neurophysiology, Sleep Medicine and Neuroimaging  [facility] [address] [place], [place] [address] [phone]

## 2019-12-29 ENCOUNTER — Telehealth: Payer: Self-pay | Admitting: *Deleted

## 2019-12-29 NOTE — Telephone Encounter (Signed)
Faxed signed order for Mods or repair to [facility] back to [facility]otion at [phone]. Received fax confirmation.

## 2020-05-11 ENCOUNTER — Encounter: Payer: Self-pay | Admitting: Neurology

## 2020-05-11 ENCOUNTER — Ambulatory Visit (INDEPENDENT_AMBULATORY_CARE_PROVIDER_SITE_OTHER): Payer: Medicare Other | Admitting: Neurology

## 2020-05-11 VITALS — BP 124/67 | HR 61 | Ht 62.0 in | Wt 108.5 lb

## 2020-05-11 DIAGNOSIS — R531 Weakness: Secondary | ICD-10-CM | POA: Diagnosis not present

## 2020-05-11 DIAGNOSIS — G35 Multiple sclerosis: Secondary | ICD-10-CM | POA: Diagnosis not present

## 2020-05-11 DIAGNOSIS — N3941 Urge incontinence: Secondary | ICD-10-CM

## 2020-05-11 DIAGNOSIS — R269 Unspecified abnormalities of gait and mobility: Secondary | ICD-10-CM

## 2020-05-11 MED ORDER — DALFAMPRIDINE ER 10 MG PO TB12: 10.0000 mg | ORAL_TABLET | Freq: Two times a day (BID) | ORAL | 11 refills | Status: DC

## 2020-05-11 MED ORDER — SOLIFENACIN SUCCINATE 10 MG PO TABS
10.0000 mg | ORAL_TABLET | Freq: Every day | ORAL | 11 refills | Status: DC
Start: 2020-05-11 — End: 2020-05-22

## 2020-05-11 NOTE — Progress Notes (Signed)
[facility]  PATIENT: Tina Webb DOB: Dec 26, 1933  REFERRING DOCTOR OR PCP:  Wilhelmina Mcardle (PCP); Huel Coventry (neurology) SOURCE: Patient, notes from Dr. Excell Seltzer, imaging reports, MRI images personally reviewed.  _________________________________   HISTORICAL  CHIEF COMPLAINT:  Chief Complaint  Patient presents with  . Follow-up    RM 13 with husband. Last seen 11/02/19. In [facility] in office today. Able to obtain weight with 2 assist.  . Multiple Sclerosis    Off DMT. Lives at [place] in [place].     HISTORY OF PRESENT ILLNESS:  Tina Webb is a 86 y.o. woman with multiple sclerosis.  Update 05/11/2020: She feels her MS and neurologic impairments are stable.   She has a motorized wheelchair at home and uses a lightweight wheelchair outside.   She is able to transfer independently to chair and toilet by standing with support and pivoting.   She cannot use a walker.   She has had one fall without injury in the shower when the shower chair moved.   She has right > left weakness and spasticity in legs.    She has some numbness that is not bothersome.   She has a lot of urinary urgency and takes oxybutynin with benefit but it causes a dry mouth.   Myrbetriq did not help.     She has mild fatigue that is not limiting her much.  A 15 minute rest helps when she is more tired.  Mood is fine.     MS History: She was diagnosed with MS in 1984 after presenting with numbness in both legs.  She had her husband have recently moved to the [place] area from [place].  While in [place], she was seen Dr. Huel Coventry for her multiple sclerosis.    She reports that the numbness that presented in 1984 was present in both legs.  She improved over time and did not receive any steroids at that time..  Initially, she was not on any DMT but started Copaxone in the 1990's.   She had progression and over the years was also on Rebif, Tecfidera, Aubagio, Zinbryta and  Gilenya.  On over the medications, she continued to have slow worsening of her disability.   She did have some definite motor and visual exacerbations in the 1980's and 1990's.  She had a severe UTI in 2018 and she stopped all DMTs at that time.    She has a bladder stimulator (not functional) and is unable to get an MRI.   Her last MRI was around 2013.  She has worked with a Systems analyst and done physical therapy.  This has helped her gain better ability to transfer.   MRI of the brain and cervical spine from 06/10/2012 was personally reviewed.  The MRI of the cervical spine showed T2 hyperintense foci adjacent to C1-C2, C4 and C7 posteriorly and adjacent to C5-C6 to the left.  Additionally there are degenerative changes at C5-C6 causing mild spinal stenosis and right greater than left foraminal narrowing.  The MRI of the brain showed multiple T2/FLAIR hyperintense foci in the periventricular, juxtacortical and deep white matter of both hemispheres.  Additionally there is a T2 hyperintense focus anteriorly within the pontomedullary junction towards the right.  REVIEW OF SYSTEMS: Constitutional: No fevers, chills, sweats, or change in appetite.  She has fatigue.  Eyes: No visual changes, double vision, eye pain Ear, nose and throat: No hearing loss, ear pain, nasal congestion, sore throat Cardiovascular: No chest pain, palpitations  Respiratory: No shortness of breath at rest or with exertion.   No wheezes GastrointestinaI: No nausea, vomiting, diarrhea, abdominal pain, fecal incontinence Genitourinary:Urinary frequency, urgency and multiple UTIs Musculoskeletal:She often has left shoulder pain.   Integumentary: No rash, pruritus, skin lesions Neurological: as above Psychiatric: No depression at this time.  No anxiety Endocrine: No palpitations, diaphoresis, change in appetite, change in weigh or increased thirst Hematologic/Lymphatic: No anemia, purpura, petechiae. Allergic/Immunologic:  No itchy/runny eyes, nasal congestion, recent allergic reactions, rashes  ALLERGIES: No Known Allergies  HOME MEDICATIONS:  Current Outpatient Medications:  .  ASPIRIN 81 PO, Take 81 mg by mouth once a week., Disp: , Rfl:  .  baclofen (LIORESAL) 10 MG tablet, Take 0.5 tablets (5 mg total) by mouth 2 (two) times daily., Disp: 90 each, Rfl: 4 .  BIOTIN PO, Take 100 mg by mouth in the morning, at noon, and at bedtime. Takes total of  daily, Disp: , Rfl:  .  clobetasol cream (TEMOVATE) 0.05 %, Apply 1 application topically as needed., Disp: , Rfl:  .  Cranberry-Vitamin C-Probiotic (AZO CRANBERRY PO), Take 1 tablet by mouth daily., Disp: , Rfl:  .  dalfampridine (AMPYRA) 10 MG TB12, Take 1 tablet (10 mg total) by mouth in the morning and at bedtime. BRAND NAME AMPYRA MEDICALLY NECESSARY, Disp: 60 tablet, Rfl: 11 .  DICLOFENAC SODIUM PO, Take 100 mg by mouth as needed., Disp: , Rfl:  .  Esomeprazole Magnesium (NEXIUM PO), Take 1 Dose by mouth as needed., Disp: , Rfl:  .  estradiol (ESTRING) 2 MG vaginal ring, Place 2 mg vaginally every 3 (three) months. follow package directions, Disp: , Rfl:  .  gabapentin (NEURONTIN) 300 MG capsule, Take 300 mg by mouth 3 (three) times daily., Disp: , Rfl:  .  hydrocortisone 2.5 % cream, Apply 1 application topically as needed., Disp: , Rfl:  .  levothyroxine (SYNTHROID) 25 MCG tablet, Take 25 mcg by mouth daily before breakfast., Disp: , Rfl:  .  liothyronine (CYTOMEL) 5 MCG tablet, Take 5 mcg by mouth in the morning and at bedtime., Disp: , Rfl:  .  methenamine (HIPREX) 1 g tablet, Take 1 g by mouth daily., Disp: , Rfl:  .  mupirocin cream (BACTROBAN) 2 %, Apply 1 application topically as needed., Disp: , Rfl:  .  nitrofurantoin (MACRODANTIN) 50 MG capsule, Take 50 mg by mouth daily., Disp: , Rfl:  .  OMEPRAZOLE PO, Take 1 Dose by mouth as needed., Disp: , Rfl:  .  oxybutynin (DITROPAN) 5 MG tablet, Take 5 mg by mouth 4 (four) times daily., Disp: , Rfl:    .  polyethylene glycol (MIRALAX) 17 g packet, Take 17 g by mouth daily., Disp: , Rfl:  .  Probiotic Product (PROBIOTIC PO), Take 2 tablets by mouth daily., Disp: , Rfl:  .  rosuvastatin (CRESTOR) 20 MG tablet, Take 20 mg by mouth daily., Disp: , Rfl:  .  triamcinolone cream (KENALOG) 0.1 %, Apply 1 application topically as needed., Disp: , Rfl:  .  VITAMIN D PO, Take 4,000 Units by mouth daily., Disp: , Rfl:  .  solifenacin (VESICARE) 10 MG tablet, Take 1 tablet (10 mg total) by mouth daily., Disp: 30 tablet, Rfl: 11  PAST MEDICAL HISTORY: Past Medical History:  Diagnosis Date  . Anxiety   . High cholesterol   . Multiple sclerosis (HCC)   . Rotator cuff tear    left    PAST SURGICAL HISTORY: Past Surgical History:  Procedure Laterality  Date  . BLADDER SURGERY  2018   STEM placement   . ULNAR NERVE REPAIR Right 2010    FAMILY HISTORY: Family History  Problem Relation Age of Onset  . Heart attack Mother   . Transient ischemic attack Mother   . Arthritis Mother   . Heart Problems Father     SOCIAL HISTORY:  Social History   Socioeconomic History  . Marital status: Married    Spouse name: [name]  . Number of children: 3  . Years of education: College  . Highest education level: Not on file  Occupational History  . Occupation: Retired  Tobacco Use  . Smoking status: Never Smoker  . Smokeless tobacco: Never Used  Substance and Sexual Activity  . Alcohol use: Yes    Comment: 2 drinks per week  . Drug use: Never  . Sexual activity: Not on file  Other Topics Concern  . Not on file  Social History Narrative   Originally right handed but had surgery and now uses left hand for eating and other activities   Caffeine use: 2 cups coffee per day   Lives with husband   Lives at [place] with husband in [place]   Social Determinants of Health   Financial Resource Strain:   . Difficulty of Paying Living Expenses: Not on file  Food Insecurity:   . Worried About  Programme researcher, broadcasting/film/video in the Last Year: Not on file  . Ran Out of Food in the Last Year: Not on file  Transportation Needs:   . Lack of Transportation (Medical): Not on file  . Lack of Transportation (Non-Medical): Not on file  Physical Activity:   . Days of Exercise per Week: Not on file  . Minutes of Exercise per Session: Not on file  Stress:   . Feeling of Stress : Not on file  Social Connections:   . Frequency of Communication with Friends and Family: Not on file  . Frequency of Social Gatherings with Friends and Family: Not on file  . Attends Religious Services: Not on file  . Active Member of Clubs or Organizations: Not on file  . Attends Banker Meetings: Not on file  . Marital Status: Not on file  Intimate Partner Violence:   . Fear of Current or Ex-Partner: Not on file  . Emotionally Abused: Not on file  . Physically Abused: Not on file  . Sexually Abused: Not on file     PHYSICAL EXAM  Vitals:   05/11/20 1109  BP: 124/67  Pulse: 61  Weight: 108 lb 8 oz (49.2 kg)  Height: 5\' 2"  (1.575 m)    Body mass index is 19.84 kg/m.   General: The patient is well-developed and well-nourished and in no acute distress  HEENT:  Head is [place]/AT.  Sclera are anicteric.  Funduscopic exam shows mild optic nerve pallor..  Neck: No carotid bruits are noted.  The neck is nontender.  Cardiovascular: The heart has a regular rate and rhythm with a normal S1 and S2. There was 3/6 systolic murmur radiating to the carotids   Skin: Extremities are without rash but with right > left leg edema.  Musculoskeletal:  Back is nontender.  The left shoulder is not tender currently.  Neurologic Exam  Mental status: The patient is alert and oriented x 3 at the time of the examination. The patient has apparent normal recent and remote memory, with an apparently normal attention span and concentration ability.   Speech is normal.  Cranial nerves: Extraocular movements are full. Pupils  are equal, round, and reactive to light and accomodation.  Color vision is symmetric.  Facial symmetry is present. There is good facial sensation to soft touch bilaterally.Facial strength is normal.  Trapezius and sternocleidomastoid strength is normal. No dysarthria is noted.  The tongue is midline, and the patient has symmetric elevation of the soft palate. No obvious hearing deficits are noted.  Motor:  Muscle bulk is reduced in the ulnar innervated hand muscles bilaterally, right worse than left.   Tone is increased in the right leg.  . Strength is 3/5 in the ulnar innervated hand muscles on the right.  Proximal muscles on the right were 4/5.  In the left arm, strength was 4 to 4+ /5.  In the legs, strength was 2+/5 in the lefthip flexors and 4 -/5 in the quadriceps and 3/5 elsewhere in the leg.  Otherwise, strength was 2/5 at the hip flexors, 2+/5 in the quadricep muscles and 2-2+ in the lower leg.  Sensory: Sensory testing was intact to touch and vibration sensation. Coordination: Cerebellar testing reveals good finger-nose-finger and she can't do heel-to-shin bilaterally.  Gait and station: She cannot stand or walk  Reflexes: Deep tendon reflexes are increased in legs, right > left.          ASSESSMENT AND PLAN  Multiple sclerosis (HCC)  Gait disorder  Weakness  Urge incontinence of urine  1.   She will remain off of an immunologic disease modifying therapy.  She feels that she may have had some benefit with high-dose biotin and will continue.  We also discussed that there is some small studies showing that alpha lipoic acid might have some benefit for progressive MS and she will consider starting this. 2.   Continue dalfampridine. 3.   Change oxybutynin to solifenacin as it may have last of a dry mouth as a side effect. 4.   Return in 1 year or sooner if there are new or worsening neurologic symptoms.  45-minute office visit with the majority of the time spent face-to-face for  history and physical, discussion/counseling and decision-making.  Additional time with record review and documentation.  Glorene Leitzke A. Epimenio Foot, MD, [facility] 05/11/2020, 3:35 PM Certified in Neurology, Clinical Neurophysiology, Sleep Medicine and Neuroimaging  [facility] [address], [address] [place], [place] [address] [phone]

## 2020-05-22 ENCOUNTER — Telehealth: Payer: Self-pay | Admitting: Neurology

## 2020-05-22 MED ORDER — SOLIFENACIN SUCCINATE 10 MG PO TABS
10.0000 mg | ORAL_TABLET | Freq: Every day | ORAL | 3 refills | Status: DC
Start: 2020-05-22 — End: 2020-12-27

## 2020-05-22 NOTE — Telephone Encounter (Signed)
Pt called, update pharmacy for solifenacin (VESICARE) 10 MG tablet send prescription to [facility] - [place], [place]. Would like a 90-day supply.

## 2020-05-22 NOTE — Telephone Encounter (Signed)
E-scribed rx to [facility] as requested. 

## 2020-06-13 ENCOUNTER — Telehealth: Payer: Self-pay | Admitting: Neurology

## 2020-06-13 NOTE — Telephone Encounter (Signed)
Is okay for her to wait for the switch medications.  It is not necessary to check lab work for these type of bladder medications.

## 2020-06-13 NOTE — Telephone Encounter (Signed)
Pt wants to know if re: her solifenacin (VESICARE) 10 MG tablet can she interchange it with Oxybutynin, please call pt to discuss.

## 2020-06-13 NOTE — Telephone Encounter (Signed)
Yes we can go ahead and change to oxybutynin 5 mg twice a day.  We can send in #60 with 5 refills

## 2020-06-13 NOTE — Telephone Encounter (Signed)
Called pt back. She has been on Vesicare for a month now. Wanting to know if Dr. Epimenio Foot is okay with her taking vesicare for a month and then changing back to oxybutynin for a month and then going back to vesicare. She has some left over at home. She also wants to know if labs needs to be done to check her kidney function. Used to be checked while on oxybutynin. Advised I will send MD message and call her back tomorrow with his response.

## 2020-06-14 NOTE — Telephone Encounter (Signed)
Called and spoke with pt. Advised per Dr. Epimenio Foot that he has approved her stopping vesicare and trying oxybutynin again to see if it is more beneficial. He does not want her switching between the two long term. Once she determines which one helps more, she should stay on that one. She will provide update once she knows which one she wants to stay on. Advised no labs needed to be done. She should continue to follow up as planned. She verbalized understanding.

## 2020-08-04 ENCOUNTER — Telehealth: Payer: Self-pay | Admitting: Neurology

## 2020-08-04 NOTE — Telephone Encounter (Signed)
Pt. is asking for recommendations for a  personal trainer in the [facility] area.

## 2020-08-07 NOTE — Telephone Encounter (Signed)
Called pt back. Advised we do not have a specific recommendation. She can check online/local gyms for further recommendations. She verbalized understanding and appreciation.

## 2020-08-08 ENCOUNTER — Telehealth: Payer: Self-pay | Admitting: Neurology

## 2020-08-08 NOTE — Telephone Encounter (Signed)
I spoke to the patient. She informed me this medication will need a PA. She has new coverage through [facility]. New ZT#[id number].   She has nearly a month left of medication at home.

## 2020-08-08 NOTE — Telephone Encounter (Signed)
Pt request dalfampridine (AMPYRA) 10 MG TB12 at [facility]

## 2020-08-09 DIAGNOSIS — R61 Generalized hyperhidrosis: Secondary | ICD-10-CM | POA: Insufficient documentation

## 2020-08-09 NOTE — Telephone Encounter (Signed)
Pt. is asking for an addendum for PA. She states it cannot be generic. She has tried this & it does not work. Please advise.

## 2020-08-09 NOTE — Telephone Encounter (Signed)
PA generic denied d/t pt being WC bound. Will need to speak w/ MD first to see how to proceed.

## 2020-08-09 NOTE — Telephone Encounter (Signed)
Submitted PA on CMM. Key: [id number]. Waiting on determination from [facility].

## 2020-08-09 NOTE — Telephone Encounter (Signed)
I called pt. Explained PA denied d/t her being WC bound, unable to walk w/ or w/o assistance. Pt states she is able to stand with medication. Since last visit, she has been going to PT, able to walk while holding parallel bars. She is going to reach out to [facility] PT to see if they can fax supporting notes to [place] at [phone]. She wants [place] to try and appeal decision with this information. She will call, but if not, will speak with them next week about getting notes faxed to our office.

## 2020-08-10 DIAGNOSIS — M25512 Pain in left shoulder: Secondary | ICD-10-CM | POA: Insufficient documentation

## 2020-08-10 DIAGNOSIS — G8929 Other chronic pain: Secondary | ICD-10-CM | POA: Insufficient documentation

## 2020-08-10 NOTE — Telephone Encounter (Signed)
Received note from PT. I faxed over printed/signed appeal letter to [facility] appeals requesting reconsideration for brand name Ampyra. Fax: [phone]. Received fax confirmation. Waiting on determination.

## 2020-08-14 NOTE — Telephone Encounter (Signed)
Received fax from [facility] for approval of generic dalfampridine. I reached out to Customer Care team with [facility] at [phone]. Spoke with [employer]. Informed her appeal letter was requesting coverage for brand name, not generic. Would like this reviewed. She placed me on hold to speak with authorization department. She transferred me to [facility]. He states we have to submit PA for brand name (start process over). I asked to speak with someone above him as we were not able to submit PA for brand since generic was started. We were told we needed to submit appeal at that point. He placed me on hold to speak with team lead. States we have to resubmit for brand name.  I started new case on CMM. Key: [id number] - PA Case ID: [id number]. Waiting on OV notes to be attached and then I will submit for review.

## 2020-08-15 MED ORDER — AMPYRA 10 MG PO TB12: 10.0000 mg | ORAL_TABLET | Freq: Two times a day (BID) | ORAL | 11 refills | Status: DC

## 2020-08-15 NOTE — Telephone Encounter (Signed)
LVM for pt letting her know brand name Ampyra approved, she will now be able to fill prescription. Advised her to call back if she has any further questions/concerns.

## 2020-08-15 NOTE — Addendum Note (Signed)
Addended byYetta Barre, Willem Klingensmith L on: 08/15/2020 07:00 AM   Modules accepted: Orders

## 2020-08-15 NOTE — Telephone Encounter (Signed)
Submitted PA on CMM. Received instant approval: PA Case: [id number], Status: Approved, Coverage Starts on: 07/08/2020 12:00:00 AM, Coverage Ends on: 07/07/2021 12:00:00 AM. Questions? Contact [phone].

## 2020-08-29 ENCOUNTER — Telehealth: Payer: Self-pay | Admitting: Neurology

## 2020-08-29 NOTE — Telephone Encounter (Signed)
Pt returned phone call, informed Pt of telephone note as instructed. Pt confirmed understood and would listen to the voicemail.

## 2020-08-29 NOTE — Telephone Encounter (Signed)
Pt is requesting a refill for solifenacin (VESICARE) 10 MG tablet.  Pharmacy: [facility]

## 2020-08-29 NOTE — Telephone Encounter (Signed)
Called and LVM for pt letting her know rx last sent to [facility] 05/22/20 #90, 3 refills. They should have refills on file.  Reminded her that Dr. Epimenio Foot wanted her on either oxybutynin or solifenacin, not both. Advised her to call back if she has any more questions.

## 2020-10-05 DIAGNOSIS — M19012 Primary osteoarthritis, left shoulder: Secondary | ICD-10-CM | POA: Insufficient documentation

## 2020-10-11 ENCOUNTER — Telehealth: Payer: Self-pay | Admitting: Physical Medicine and Rehabilitation

## 2020-10-11 NOTE — Telephone Encounter (Signed)
Patient husband [name] called asking to set an appt for his wife which will be a new patient to Dr. Alvester Morin. He did not state what she is being seen for. Phone number is [phone].

## 2020-10-12 NOTE — Telephone Encounter (Signed)
Patient has left shoulder pain and has been seen by an orthopedic surgeon (not in our office) and was referred by them to pain management where she has had injections. She wants a second opinion on her shoulder and possible injections. I did advise that we do not see patients for medication pain management. Please advise.

## 2020-10-12 NOTE — Telephone Encounter (Signed)
She might want to See Dr. August Saucer for eval and if injections needed we could do but yes if just pain medication then can't do

## 2020-10-13 NOTE — Telephone Encounter (Signed)
Called patient and scheduled appointment with Dr. August Saucer.

## 2020-10-31 ENCOUNTER — Telehealth: Payer: Self-pay

## 2020-10-31 NOTE — Telephone Encounter (Signed)
Pt would like an OV

## 2020-11-01 NOTE — Telephone Encounter (Signed)
Called pt and LvM #1

## 2020-11-02 ENCOUNTER — Ambulatory Visit: Payer: Medicare Other | Admitting: Orthopedic Surgery

## 2020-11-02 NOTE — Telephone Encounter (Signed)
Pt called stating she missed a call but she will be available all day today except from 11:15-11:45 and 2-3:30. She would like a CB to set appt.   [phone]

## 2020-11-03 NOTE — Telephone Encounter (Signed)
Called and sch 5/11

## 2020-11-15 ENCOUNTER — Encounter: Payer: Self-pay | Admitting: Physical Medicine and Rehabilitation

## 2020-11-15 ENCOUNTER — Other Ambulatory Visit: Payer: Self-pay

## 2020-11-15 ENCOUNTER — Ambulatory Visit (INDEPENDENT_AMBULATORY_CARE_PROVIDER_SITE_OTHER): Payer: Medicare Other | Admitting: Physical Medicine and Rehabilitation

## 2020-11-15 ENCOUNTER — Ambulatory Visit: Payer: Self-pay

## 2020-11-15 DIAGNOSIS — G35 Multiple sclerosis: Secondary | ICD-10-CM | POA: Diagnosis not present

## 2020-11-15 DIAGNOSIS — M25512 Pain in left shoulder: Secondary | ICD-10-CM | POA: Diagnosis not present

## 2020-11-15 DIAGNOSIS — G894 Chronic pain syndrome: Secondary | ICD-10-CM

## 2020-11-15 DIAGNOSIS — M19012 Primary osteoarthritis, left shoulder: Secondary | ICD-10-CM

## 2020-11-15 DIAGNOSIS — G8929 Other chronic pain: Secondary | ICD-10-CM

## 2020-11-15 DIAGNOSIS — M47812 Spondylosis without myelopathy or radiculopathy, cervical region: Secondary | ICD-10-CM

## 2020-11-15 NOTE — Progress Notes (Cosign Needed)
Left shoulder pain- patient reports torn rotator cuff in left shoulder. Pain is 10 with movement. Numeric Pain Rating Scale and Functional Assessment Average Pain 10   In the last MONTH (on 0-10 scale) has pain interfered with the following?  1. General activity like being  able to carry out your everyday physical activities such as walking, climbing stairs, carrying groceries, or moving a chair?  Rating(9)

## 2020-12-12 ENCOUNTER — Encounter: Payer: Self-pay | Admitting: Physical Medicine and Rehabilitation

## 2020-12-12 ENCOUNTER — Ambulatory Visit (INDEPENDENT_AMBULATORY_CARE_PROVIDER_SITE_OTHER): Payer: Medicare Other | Admitting: Physical Medicine and Rehabilitation

## 2020-12-12 ENCOUNTER — Other Ambulatory Visit: Payer: Self-pay

## 2020-12-12 DIAGNOSIS — G8929 Other chronic pain: Secondary | ICD-10-CM

## 2020-12-12 DIAGNOSIS — M25512 Pain in left shoulder: Secondary | ICD-10-CM

## 2020-12-12 DIAGNOSIS — M19012 Primary osteoarthritis, left shoulder: Secondary | ICD-10-CM

## 2020-12-12 NOTE — Progress Notes (Signed)
Tina Webb - 87 y.o. female MRN [id number]  Date of birth: [birth year]  Office Visit Note: Visit Date: 11/15/2020 PCP: Nadara Eaton, MD Referred by: Nadara Eaton, MD  Subjective: No chief complaint on file.  HPI: Tina Webb is a 87 y.o. female who comes in today At the request of Tina Sill,  LPN for Dr. Tommy Medal a pain physician at [facility].  I was also asked to see the patient by her husband who is present today and provides some of the history.  I have been seeing him for over a year now for chronic back pain and lumbar stenosis.  They had moved from [place] to be closer to family last year.  The patient's course is complicated with history of multiple sclerosis cardiomyopathy and left chronic recalcitrant shoulder pain with history of rotator cuff tendon arthropathy and primary osteoarthritis of the left shoulder.  She is followed in town by Dr. Epimenio Foot from a neurology standpoint.  She does have spasticity from her multiple sclerosis.  In terms of her shoulder pain she reports that in [place] she received an injection which sounds like a combination of fluoroscopically guided intra-articular injection, subacromial injection, AC joint injection and trigger point injection.  She did have the notes for me to review and unfortunately the procedure note itself does not really show the manipulations involved in the procedure itself on how to do those different locations during the 1 procedure.  Nonetheless the patient reported that she received decent relief for up to 3 months at a time and was getting those every 3 months.  Since she has come to [place] she has been seeing Dr. Tommy Medal a pain physician and I think anesthesiologist with [facility].  His notes are in the care everywhere section of Epic.  I have reviewed those notes and it appears that she also saw an orthopedist in sports medicine prior to being referred to him.  She received  a subacromial injection from the orthopedist that did not help very much.  She went on to see Dr. Danella Penton and received 2 injections one in February and 1 in April and both notes of the procedure state that she received intra-articular glenohumeral joint injection plus subacromial plus AC joint injection at the same time.  She reports only about 2 weeks of relief with the injection and February and not much with the second injection just performed.  She rates her pain as a 10 out of 10 particularly with moving and at night.  She denies any frank radicular symptoms but does have generalized neck and upper back pain.  A lot of this is probably posterior and movement.  She has been on multiple rounds of physical therapy she is in currently physical therapy more for her multiple sclerosis.  She does get some referral pain from the shoulder even close to the elbow but no tingling or paresthesias.  Review of Systems  Cardiovascular:  Positive for leg swelling.  Musculoskeletal:  Positive for joint pain.  Skin:        Ulceration  Neurological:  Positive for weakness.  All other systems reviewed and are negative. Otherwise per HPI.  Assessment & Plan: Visit Diagnoses:    ICD-10-CM   1. Chronic left shoulder pain  M25.512 XR C-ARM NO REPORT   G89.29 Large Joint Inj: L glenohumeral    2. Primary osteoarthritis of left shoulder  M19.012     3. Multiple sclerosis (HCC)  G35  4. Cervical spondylosis without myelopathy  M47.812     5. Chronic pain syndrome  G89.4        Plan: Findings:  Chronic left shoulder pain with history of rotator cuff injury and osteoarthritis with prior multi joint injections performed in [place] that were seemingly beneficial but included combination of intra-articular, subacromial, AC joint and trigger point.  She is also gotten worse over time and I am not so sure if the injection itself or just her shoulder issue.  2 injections by pain physician at [facility] or less  beneficial.  He according to his note had spoken to her about peripheral nerve stimulation.  It does appear in the notes that suprascapular nerve block was performed and I did asked the patient about that and she indicated it did not help very much.  There is an avenue for radiofrequency ablation if the suprascapular nerve block is helpful.  The pain physician there probably is more well versed with other avenues of pain relief other than just the injection.  I had a long talk with her and her husband today and I did suggest that we could at least try an intra-articular injection today given the amount of pain that she is having and I did that today to try to help her out.  She did get relief temporarily during the anesthetic phase.  I also mention that potentially seeing Dr. Burnard Bunting in our office may be beneficial just from an opinion neurosurgical standpoint.  Rena see her back in about a month post injection and continued physical therapy.   Meds & Orders: No orders of the defined types were placed in this encounter.   Orders Placed This Encounter  Procedures   Large Joint Inj: L glenohumeral   XR C-ARM NO REPORT    Follow-up: Return in about 4 weeks (around 12/13/2020).   Procedures: Large Joint Inj: L glenohumeral on 11/15/2020 11:00 AM Indications: pain and diagnostic evaluation Details: 22 G 3.5 in needle, fluoroscopy-guided anteromedial approach  Arthrogram: No  Medications: 3 mL bupivacaine 0.5 %; 60 mg triamcinolone acetonide 40 MG/ML Outcome: tolerated well, no immediate complications  There was excellent flow of contrast producing a partial arthrogram of the glenohumeral joint. The patient did have relief of symptoms during the anesthetic phase of the injection. Procedure, treatment alternatives, risks and benefits explained, specific risks discussed. Consent was given by the patient. Immediately prior to procedure a time out was called to verify the correct patient, procedure,  equipment, support staff and site/side marked as required. Patient was prepped and draped in the usual sterile fashion.         Clinical History: CERVICAL SPINE COMPLETE WITH FLEXION AND EXTENSION VIEWS   COMPARISON:  None.   FINDINGS: Moderate thoracic kyphosis. Slight anterolisthesis C4-5. No abnormal movement on flexion or extension.   Negative for fracture or mass.   Disc degeneration and spurring C3-4, C4-5, C5-6. Asymmetric facet degeneration on the left C5-6 and C6-7 related to dextroscoliosis in the cervical spine.   IMPRESSION: Multilevel disc and facet degeneration.   Negative for fracture.  No abnormal movement.    ORTHO XR SHOULDER 2 OR MORE VW UNILATERAL, 11/11/19   Order Questions Answers  Reason for exam: pain  Laterality: Left  View(s): AP,Y View,Axillary      FINDINGS: 3 views left shoulder order interpreted in clinic today.  Overlying wheelchair is evident on exam today.  Moderate osteoarthritis and evident rotator cuff arthropathy with acromion is noted on  the left shoulder.  Ulnohumeral joint is otherwise well reduced peripheral osteophyte formation.  Diagnostic Studies: All applicable diagnostic imaging related to this consultation reviewed by attending physician.   She reports that she has never smoked. She has never used smokeless tobacco. No results for input(s): HGBA1C, LABURIC in the last 8760 hours.  Objective:  VS:  HT:    WT:   BMI:     BP:   HR: bpm  TEMP: ( )  RESP:  Physical Exam Vitals and nursing note reviewed.  Constitutional:      General: She is not in acute distress.    Appearance: Normal appearance. She is well-developed. She is not ill-appearing.  HENT:     Head: Normocephalic and atraumatic.  Eyes:     Conjunctiva/sclera: Conjunctivae normal.     Pupils: Pupils are equal, round, and reactive to light.  Cardiovascular:     Rate and Rhythm: Normal rate.     Pulses: Normal pulses.  Pulmonary:     Effort: Pulmonary effort  is normal.  Musculoskeletal:     Right lower leg: Edema present.     Left lower leg: Edema present.     Comments: Patient sits somewhat uncomfortably in wheelchair with left leaning posture.  Forward flexed cervical spine.  Decent range of motion of the cervical spine but with pain with extension but not concordant pain.  Trigger points palpated to some degree along the infraspinatus and supraspinatus and teres bilaterally.  She has pain with external rotation and abduction of the left shoulder compared right.  She is limited range of motion with abduction and flexion.  She has good strength in the hands bilaterally particularly with long finger flexion and abduction and wrist extension.  She has no sensory deficits in the hands.  She has osteoarthritis of the hands.  She has a negative Hoffmann's test bilaterally negative Spurling's test.  Skin:    General: Skin is warm and dry.     Findings: No erythema or rash.  Neurological:     Mental Status: She is alert and oriented to person, place, and time. Mental status is at baseline.     Cranial Nerves: No cranial nerve deficit.     Sensory: No sensory deficit.     Motor: Weakness present. No abnormal muscle tone.     Coordination: Coordination normal.     Gait: Gait abnormal.  Psychiatric:        Mood and Affect: Mood normal.        Behavior: Behavior normal.    Ortho Exam  Imaging: No results found.  Past Medical/Family/Surgical/Social History: Medications & Allergies reviewed per EMR, new medications updated. Patient Active Problem List   Diagnosis Date Noted   Primary osteoarthritis of left shoulder 10/05/2020   Chronic left shoulder pain 08/10/2020   Chronic night sweats 08/09/2020   Multiple sclerosis (HCC) 11/02/2019   Gait disorder 11/02/2019   Weakness 11/02/2019   Urge incontinence of urine 11/02/2019   Foot-drop 10/06/2019   Senile osteoporosis 10/06/2019   Contracted bladder 06/17/2016   Injury of tendon of rotator cuff  04/25/2016   Neurogenic bladder 04/25/2016   Hypothyroidism 08/16/2010   Hypertrophic cardiomyopathy (HCC) 07/08/2010   Vitamin D deficiency 11/13/2009   Gastroesophageal reflux disease 08/14/2009   Past Medical History:  Diagnosis Date   Anxiety    High cholesterol    Multiple sclerosis (HCC)    Rotator cuff tear    left   Family History  Problem Relation Age  of Onset   Heart attack Mother    Transient ischemic attack Mother    Arthritis Mother    Heart Problems Father    Past Surgical History:  Procedure Laterality Date   BLADDER SURGERY  2018   STEM placement    ULNAR NERVE REPAIR Right 2010   Social History   Occupational History   Occupation: Retired  Tobacco Use   Smoking status: Never   Smokeless tobacco: Never  Substance and Sexual Activity   Alcohol use: Yes    Comment: 2 drinks per week   Drug use: Never   Sexual activity: Not on file

## 2020-12-12 NOTE — Progress Notes (Signed)
Small amount of relief for a few days following left shoulder injection. "Horrible pain" with any movement of left shoulder. Numeric Pain Rating Scale and Functional Assessment Average Pain 10   In the last MONTH (on 0-10 scale) has pain interfered with the following?  1. General activity like being  able to carry out your everyday physical activities such as walking, climbing stairs, carrying groceries, or moving a chair?  Rating(8)

## 2020-12-13 ENCOUNTER — Encounter: Payer: Self-pay | Admitting: Physical Medicine and Rehabilitation

## 2020-12-13 NOTE — Progress Notes (Signed)
Tina Webb - 87 y.o. female MRN [id number]  Date of birth: Nov 27, 1933  Office Visit Note: Visit Date: 12/12/2020 PCP: Nadara Eaton, MD Referred by: Nadara Eaton, MD  Subjective: Chief Complaint  Patient presents with  . Left Shoulder - Pain   HPI: Tina Webb is a 87 y.o. female who comes in today For follow-up evaluation of left shoulder pain status post intra-articular glenohumeral joint injection fluoroscopic guidance.  Prior notes can be fully reviewed.  Patient reports worsening shoulder pain now with decreased range of motion of the left shoulder.  She has a pretty complicated medical case with multiple sclerosis and now this shoulder pain is really hurting her activities of daily living.  She is present with her husband who provides some of the history.  She reports injection did give her some relief initially for a couple of days or so and especially during the anesthetic park.  She does not report much in the way of symptomatic relief after that.  She has had multiple injections in the past but this was the first or second used with fluoroscopic guidance in both of these at least prior to mine were only temporarily successful.  She does not note any paresthesias or radicular type pain.  Her pain is worse with arm movement.  She rates it as a 10 out of 10 horrible pain.  She denies any right-sided complaints.  No focal weakness other than just inability to move the shoulder like she would like to.  It does affect her mobility and again activities of daily living.  Review of Systems  Musculoskeletal: Positive for joint pain.   Otherwise per HPI.  Assessment & Plan: Visit Diagnoses:    ICD-10-CM   1. Chronic left shoulder pain  M25.512    G89.29   2. Arthritis of left glenohumeral joint  M19.012      Plan: Findings:  Chronic worsening severe left shoulder pain with history of rotator cuff problems as well as osteoarthritis.  Complicated history of multiple  sclerosis.  At this point I am going to refer her to Dr. Burnard Bunting for evaluation and management as I do not really know what alternative she has at this point.  She asked me today about a peripheral nerve stimulator device that she read about.  I have some knowledge of these and these devices seem to crop up from time to time and there is various ones on the market but I am not sure of the effectiveness of that.  I do not know of anybody in town doing that procedure other than I do know Dr. Lorrine Kin at [facility] does some peripheral nerve stimulation around the knee but I am not sure if he has any experience with the shoulder.  That would be 1 avenue depending on what is discussed with Dr. August Saucer.    Meds & Orders: No orders of the defined types were placed in this encounter.  No orders of the defined types were placed in this encounter.   Follow-up: No follow-ups on file.   Procedures: No procedures performed      Clinical History: CERVICAL SPINE COMPLETE WITH FLEXION AND EXTENSION VIEWS  COMPARISON: None.  FINDINGS: Moderate thoracic kyphosis. Slight anterolisthesis C4-5. No abnormal movement on flexion or extension.  Negative for fracture or mass.  Disc degeneration and spurring C3-4, C4-5, C5-6. Asymmetric facet degeneration on the left C5-6 and C6-7 related to dextroscoliosis in the cervical spine.  IMPRESSION:  Multilevel disc and facet degeneration.  Negative for fracture. No abnormal movement.  ORTHO XR SHOULDER 2 OR MORE VW UNILATERAL, 11/11/19  Order Questions Answers  Reason for exam: pain  Laterality: Left  View(s): AP,Y View,Axillary    FINDINGS: 3 views left shoulder order interpreted in clinic today. Overlying wheelchair is evident on exam today. Moderate osteoarthritis and evident rotator cuff arthropathy with acromion is noted on the left shoulder. Ulnohumeral joint is otherwise well reduced peripheral osteophyte  formation.  Diagnostic Studies: All applicable diagnostic imaging related to this consultation reviewed by attending physician.   She reports that she has never smoked. She has never used smokeless tobacco. No results for input(s): HGBA1C, LABURIC in the last 8760 hours.  Objective:  VS:  HT:    WT:   BMI:     BP:   HR: bpm  TEMP: ( )  RESP:  Physical Exam Vitals and nursing note reviewed.  Constitutional:      General: She is not in acute distress.    Appearance: Normal appearance. She is well-developed. She is not ill-appearing.  HENT:     Head: Normocephalic and atraumatic.  Eyes:     Conjunctiva/sclera: Conjunctivae normal.     Pupils: Pupils are equal, round, and reactive to light.  Cardiovascular:     Rate and Rhythm: Normal rate.     Pulses: Normal pulses.  Pulmonary:     Effort: Pulmonary effort is normal.  Musculoskeletal:     Right lower leg: No edema.     Left lower leg: No edema.     Comments: Patient is in wheelchair today and she does exhibit painful range of motion of the left shoulder with decreased range of motion with abduction and forward flexion.  She has pain with external rotation more than internal.  She has good strength in the hands without any sensory deficits.  She has a negative Hoffmann's test.  She has a negative Spurling's.  Skin:    General: Skin is warm and dry.     Findings: No erythema or rash.  Neurological:     Mental Status: She is alert and oriented to person, place, and time.     Sensory: No sensory deficit.     Motor: Weakness present. No abnormal muscle tone.     Coordination: Coordination normal.     Gait: Gait abnormal.  Psychiatric:        Mood and Affect: Mood normal.        Behavior: Behavior normal.     Ortho Exam  Imaging: No results found.  Past Medical/Family/Surgical/Social History: Medications & Allergies reviewed per EMR, new medications updated. Patient Active Problem List   Diagnosis Date Noted  . Primary  osteoarthritis of left shoulder 10/05/2020  . Chronic left shoulder pain 08/10/2020  . Chronic night sweats 08/09/2020  . Multiple sclerosis (HCC) 11/02/2019  . Gait disorder 11/02/2019  . Weakness 11/02/2019  . Urge incontinence of urine 11/02/2019  . Foot-drop 10/06/2019  . Senile osteoporosis 10/06/2019  . Contracted bladder 06/17/2016  . Injury of tendon of rotator cuff 04/25/2016  . Neurogenic bladder 04/25/2016  . Hypothyroidism 08/16/2010  . Hypertrophic cardiomyopathy (HCC) 07/08/2010  . Vitamin D deficiency 11/13/2009  . Gastroesophageal reflux disease 08/14/2009   Past Medical History:  Diagnosis Date  . Anxiety   . High cholesterol   . Multiple sclerosis (HCC)   . Rotator cuff tear    left   Family History  Problem Relation Age of Onset  .  Heart attack Mother   . Transient ischemic attack Mother   . Arthritis Mother   . Heart Problems Father    Past Surgical History:  Procedure Laterality Date  . BLADDER SURGERY  2018   STEM placement   . ULNAR NERVE REPAIR Right 2010   Social History   Occupational History  . Occupation: Retired  Tobacco Use  . Smoking status: Never Smoker  . Smokeless tobacco: Never Used  Substance and Sexual Activity  . Alcohol use: Yes    Comment: 2 drinks per week  . Drug use: Never  . Sexual activity: Not on file

## 2020-12-14 MED ORDER — BUPIVACAINE HCL 0.5 % IJ SOLN
3.0000 mL | INTRAMUSCULAR | Status: AC | PRN
  Administered 2020-11-15: 3 mL via INTRA_ARTICULAR

## 2020-12-14 MED ORDER — TRIAMCINOLONE ACETONIDE 40 MG/ML IJ SUSP
60.0000 mg | INTRAMUSCULAR | Status: AC | PRN
  Administered 2020-11-15: 60 mg via INTRA_ARTICULAR

## 2020-12-21 ENCOUNTER — Encounter: Payer: Self-pay | Admitting: Orthopedic Surgery

## 2020-12-21 ENCOUNTER — Ambulatory Visit (INDEPENDENT_AMBULATORY_CARE_PROVIDER_SITE_OTHER): Payer: Medicare Other | Admitting: Orthopedic Surgery

## 2020-12-21 ENCOUNTER — Other Ambulatory Visit: Payer: Self-pay

## 2020-12-21 ENCOUNTER — Ambulatory Visit (INDEPENDENT_AMBULATORY_CARE_PROVIDER_SITE_OTHER): Payer: Medicare Other

## 2020-12-21 DIAGNOSIS — M25512 Pain in left shoulder: Secondary | ICD-10-CM

## 2020-12-21 NOTE — Progress Notes (Signed)
Office Visit Note   Patient: Tina Webb           Date of Birth: [birth year]           MRN: [id number] Visit Date: 12/21/2020 Requested by: Nadara Eaton, MD [address] [place],  [place] [address] PCP: Nadara Eaton, MD  Subjective: Chief Complaint  Patient presents with   Left Shoulder - Pain, Follow-up    HPI: Tina Webb is a 87 y.o. female who presents to the office complaining of left shoulder pain.  Patient complains of constant left shoulder pain.  She initially injured her left shoulder in 2016 when she was reaching out for something while she was in her wheelchair.  She has history of MS.  She initially only had pain with activity of the left shoulder but now it has become to the point where she has constant pain all the time.  She has received cortisone injections in the past which used to provide good relief but now her most recent injection by Dr. Alvester Morin on 11/15/2020 has only provided 50% relief from 1 day.  She has been doing physical therapy 3 times per week without any real improvement in her function and pain.  No history of shoulder surgery or dislocation.  Localizes pain to the lateral aspect of the left shoulder with radiation to the elbow but no significant consistent radiation past the elbow.  She has no consistent neck pain..                ROS: All systems reviewed are negative as they relate to the chief complaint within the history of present illness.  Patient denies fevers or chills.  Assessment & Plan: Visit Diagnoses:  1. Left shoulder pain, unspecified chronicity     Plan: Patient is an 87 year old female who presents complaint of left shoulder pain.  She has multiple year history of left shoulder pain with today's radiographs demonstrating severe glenohumeral osteoarthritis.  She has rotator cuff weakness on exam today but has no documented evidence of rotator cuff tear.  She has been receiving cortisone injections and doing physical therapy  but now these modalities are not providing any lasting relief.  Wants to discuss options.  Discussed the potential for shoulder replacement but patient is not interested in any surgical management.  Discussed peripheral nerve stimulation for left shoulder pain.  In general [name] wants to try injection therapy.  We will try a "divided" injection into the subacromial and glenohumeral joint which is essentially the same space.  She had good success with that in [place].  If that fails then we examined off label gel injections in the shoulder joint.  We found the most economical 1 which was approximately $250.  Total cost would be around 4 [phone] by estimate.  See her back in 2 weeks for shoulder injection cortisone with possible gel injection to follow.  Follow-Up Instructions: No follow-ups on file.   Orders:  Orders Placed This Encounter  Procedures   XR Shoulder Left   No orders of the defined types were placed in this encounter.     Procedures: No procedures performed   Clinical Data: No additional findings.  Objective: Vital Signs: There were no vitals taken for this visit.  Physical Exam:  Constitutional: Patient appears well-developed HEENT:  Head: Normocephalic Eyes:EOM are normal Neck: Normal range of motion Cardiovascular: Normal rate Pulmonary/chest: Effort normal Neurologic: Patient is alert Skin: Skin is warm Psychiatric: Patient has normal mood and affect  Ortho Exam: Ortho exam demonstrates left shoulder with 80 degrees external rotation, 95 degrees abduction, 110 degrees forward flexion passively.  Not able to achieve this range of motion actively with significant difficulty lifting her arm past 50 to 60 degrees of forward flexion.  Axillary nerve intact with deltoid firing.  Weakness of supraspinatus, infraspinatus.  Subscapularis with slight weakness.  Positive Hornblower sign.  Positive drop arm test.  No pain with cervical spine range of motion.  Negative Spurling  sign.  Specialty Comments:  No specialty comments available.  Imaging: No results found.   PMFS History: Patient Active Problem List   Diagnosis Date Noted   Primary osteoarthritis of left shoulder 10/05/2020   Chronic left shoulder pain 08/10/2020   Chronic night sweats 08/09/2020   Multiple sclerosis (HCC) 11/02/2019   Gait disorder 11/02/2019   Weakness 11/02/2019   Urge incontinence of urine 11/02/2019   Foot-drop 10/06/2019   Senile osteoporosis 10/06/2019   Contracted bladder 06/17/2016   Injury of tendon of rotator cuff 04/25/2016   Neurogenic bladder 04/25/2016   Hypothyroidism 08/16/2010   Hypertrophic cardiomyopathy (HCC) 07/08/2010   Vitamin D deficiency 11/13/2009   Gastroesophageal reflux disease 08/14/2009   Past Medical History:  Diagnosis Date   Anxiety    High cholesterol    Multiple sclerosis (HCC)    Rotator cuff tear    left    Family History  Problem Relation Age of Onset   Heart attack Mother    Transient ischemic attack Mother    Arthritis Mother    Heart Problems Father     Past Surgical History:  Procedure Laterality Date   BLADDER SURGERY  2018   STEM placement    ULNAR NERVE REPAIR Right 2010   Social History   Occupational History   Occupation: Retired  Tobacco Use   Smoking status: Never   Smokeless tobacco: Never  Substance and Sexual Activity   Alcohol use: Yes    Comment: 2 drinks per week   Drug use: Never   Sexual activity: Not on file

## 2020-12-26 ENCOUNTER — Telehealth: Payer: Self-pay | Admitting: Neurology

## 2020-12-26 NOTE — Telephone Encounter (Signed)
Are you ok with changing rx to brand name?

## 2020-12-26 NOTE — Telephone Encounter (Signed)
Pt called stating that she was sent the generic of the solifenacin ([employer]) 10 MG tablet this time around and it seems like it's not working as well as the [employer] worked. Pt would like to discuss with RN. Please advise.

## 2020-12-27 MED ORDER — VESICARE 10 MG PO TABS: 10.0000 mg | ORAL_TABLET | Freq: Every day | ORAL | 3 refills | Status: DC

## 2020-12-27 NOTE — Telephone Encounter (Signed)
Called and spoke w/ pt. Relayed Dr. Bonnita Hollow note. Pt agreeable to plan. Aware it may not be covered but we can attempt PA if needed. E-scribed to [employer] order as requested. (Now [facility])

## 2020-12-27 NOTE — Addendum Note (Signed)
Addended by: Arther Abbott on: 12/27/2020 08:31 AM   Modules accepted: Orders

## 2021-01-12 ENCOUNTER — Encounter: Payer: Self-pay | Admitting: Orthopedic Surgery

## 2021-01-12 ENCOUNTER — Ambulatory Visit (INDEPENDENT_AMBULATORY_CARE_PROVIDER_SITE_OTHER): Payer: Medicare Other | Admitting: Orthopedic Surgery

## 2021-01-12 DIAGNOSIS — M19012 Primary osteoarthritis, left shoulder: Secondary | ICD-10-CM | POA: Diagnosis not present

## 2021-01-12 MED ORDER — BUPIVACAINE HCL 0.5 % IJ SOLN
9.0000 mL | INTRAMUSCULAR | Status: AC | PRN
  Administered 2021-01-12: 9 mL via INTRA_ARTICULAR

## 2021-01-12 MED ORDER — METHYLPREDNISOLONE ACETATE 40 MG/ML IJ SUSP
40.0000 mg | INTRAMUSCULAR | Status: AC | PRN
  Administered 2021-01-12: 40 mg via INTRA_ARTICULAR

## 2021-01-12 MED ORDER — LIDOCAINE HCL 1 % IJ SOLN
5.0000 mL | INTRAMUSCULAR | Status: AC | PRN
Start: 2021-01-12 — End: 2021-01-12
  Administered 2021-01-12: 5 mL

## 2021-01-12 NOTE — Progress Notes (Signed)
Office Visit Note   Patient: Tina Webb           Date of Birth: March 11, 1934           MRN: [id number] Visit Date: 01/12/2021 Requested by: Nadara Eaton, MD [address] [place],  [place] [address] PCP: Nadara Eaton, MD  Subjective: Chief Complaint  Patient presents with   Left Shoulder - Pain    HPI: Tina Webb is an 87 year old patient with left shoulder pain.  Has known history of arthritis.  Had glenohumeral injection over a month ago in that did not give her sustained relief.  She would like to have a divided injection into the subacromial space and glenohumeral joint today.  We also discussed last visit about gel injection out-of-pocket expense with Dr. Alvester Morin upstairs if this does not help.  Patient has MS and uses a motorized wheelchair.  She is not interested in complete pain relief but just some marginal relief.  Not really a candidate for replacement due to bone loss.              ROS: All systems reviewed are negative as they relate to the chief complaint within the history of present illness.  Patient denies  fevers or chills.   Assessment & Plan: Visit Diagnoses:  1. Arthritis of left shoulder region     Plan: Impression is left shoulder arthritis.  Divided glenohumeral and subacromial injection performed today.  She will call when those wear off and we will get her set up for out-of-pocket expense gel injection which is detailed in the prior note.  That may take 1 to 2 weeks to get arranged.  Follow-Up Instructions: Return if symptoms worsen or fail to improve.   Orders:  No orders of the defined types were placed in this encounter.  No orders of the defined types were placed in this encounter.     Procedures: Large Joint Inj: L glenohumeral on 01/12/2021 9:10 AM Indications: diagnostic evaluation and pain Details: 18 G 1.5 in needle, posterior approach  Arthrogram: No  Medications: 9 mL bupivacaine 0.5 %; 40 mg methylPREDNISolone acetate 40 MG/ML;  5 mL lidocaine 1 % Outcome: tolerated well, no immediate complications Procedure, treatment alternatives, risks and benefits explained, specific risks discussed. Consent was given by the patient. Immediately prior to procedure a time out was called to verify the correct patient, procedure, equipment, support staff and site/side marked as required. Patient was prepped and draped in the usual sterile fashion.      Clinical Data: No additional findings.  Objective: Vital Signs: There were no vitals taken for this visit.  Physical Exam:   Constitutional: Patient appears well-developed HEENT:  Head: Normocephalic Eyes:EOM are normal Neck: Normal range of motion Cardiovascular: Normal rate Pulmonary/chest: Effort normal Neurologic: Patient is alert Skin: Skin is warm Psychiatric: Patient has normal mood and affect   Ortho Exam: Ortho exam demonstrates diminished active and passive range of motion left shoulder but the deltoid does fire.  Course grinding and crepitus is present.  Motor or sensory function of the hand is intact.   Specialty Comments:  No specialty comments available.  Imaging: No results found.   PMFS History: Patient Active Problem List   Diagnosis Date Noted   Primary osteoarthritis of left shoulder 10/05/2020   Chronic left shoulder pain 08/10/2020   Chronic night sweats 08/09/2020   Multiple sclerosis (HCC) 11/02/2019   Gait disorder 11/02/2019   Weakness 11/02/2019   Urge incontinence of urine 11/02/2019  Foot-drop 10/06/2019   Senile osteoporosis 10/06/2019   Contracted bladder 06/17/2016   Injury of tendon of rotator cuff 04/25/2016   Neurogenic bladder 04/25/2016   Hypothyroidism 08/16/2010   Hypertrophic cardiomyopathy (HCC) 07/08/2010   Vitamin D deficiency 11/13/2009   Gastroesophageal reflux disease 08/14/2009   Past Medical History:  Diagnosis Date   Anxiety    High cholesterol    Multiple sclerosis (HCC)    Rotator cuff tear    left     Family History  Problem Relation Age of Onset   Heart attack Mother    Transient ischemic attack Mother    Arthritis Mother    Heart Problems Father     Past Surgical History:  Procedure Laterality Date   BLADDER SURGERY  2018   STEM placement    ULNAR NERVE REPAIR Right 2010   Social History   Occupational History   Occupation: Retired  Tobacco Use   Smoking status: Never   Smokeless tobacco: Never  Substance and Sexual Activity   Alcohol use: Yes    Comment: 2 drinks per week   Drug use: Never   Sexual activity: Not on file

## 2021-01-23 ENCOUNTER — Telehealth: Payer: Self-pay | Admitting: Orthopedic Surgery

## 2021-01-23 NOTE — Telephone Encounter (Signed)
We don't do gel injections in the shoulder any more.

## 2021-01-23 NOTE — Telephone Encounter (Signed)
Yes u and I looked and it was the one about 200 300 dolloars plus fn cost of injection

## 2021-01-23 NOTE — Telephone Encounter (Signed)
Pt called stating Dr.Dean asked her to CB a couple weeks after her inj to see how it worked for her. Pt states the inj barely helped her, if any; and pt states Dr. August Saucer mentioned setting something up with Dr.Newton. Pt would like a CB to discuss further.   [phone]

## 2021-01-25 NOTE — Telephone Encounter (Signed)
Patient called regarding previous message she wants to see Dr.Newton for a injection, patient is requesting a call back:[phone]

## 2021-01-30 ENCOUNTER — Telehealth: Payer: Self-pay | Admitting: Orthopedic Surgery

## 2021-01-30 NOTE — Telephone Encounter (Signed)
Pt returned call about appt with newton.   CB [phone]

## 2021-01-30 NOTE — Telephone Encounter (Signed)
Ok to schedule.

## 2021-01-30 NOTE — Telephone Encounter (Signed)
Left message #1

## 2021-01-31 ENCOUNTER — Telehealth: Payer: Self-pay | Admitting: Physical Medicine and Rehabilitation

## 2021-01-31 NOTE — Telephone Encounter (Signed)
Holding times for patient on 8/8, 8/15, and 8/22 at 1000. Patient will call me back to confirm.

## 2021-01-31 NOTE — Telephone Encounter (Signed)
Pt return call to [place]. Please call pt at [phone].

## 2021-01-31 NOTE — Telephone Encounter (Signed)
Patient is scheduled for 8/9, 8/16, and 8/23. She does not have a preference for the brand. She does want one of the $375 per syringe types.

## 2021-01-31 NOTE — Telephone Encounter (Signed)
See previous message

## 2021-02-12 ENCOUNTER — Ambulatory Visit: Payer: Medicare Other | Admitting: Physical Medicine and Rehabilitation

## 2021-02-13 ENCOUNTER — Other Ambulatory Visit: Payer: Self-pay

## 2021-02-13 ENCOUNTER — Ambulatory Visit: Payer: Medicare Other | Admitting: Physical Medicine and Rehabilitation

## 2021-02-13 ENCOUNTER — Ambulatory Visit: Payer: Self-pay

## 2021-02-13 ENCOUNTER — Encounter: Payer: Self-pay | Admitting: Physical Medicine and Rehabilitation

## 2021-02-13 DIAGNOSIS — G8929 Other chronic pain: Secondary | ICD-10-CM

## 2021-02-13 DIAGNOSIS — M25512 Pain in left shoulder: Secondary | ICD-10-CM | POA: Diagnosis not present

## 2021-02-13 MED ORDER — SODIUM HYALURONATE (VISCOSUP) 20 MG/2ML IX SOSY
20.0000 mg | PREFILLED_SYRINGE | INTRA_ARTICULAR | Status: AC | PRN
  Administered 2021-02-13: 20 mg via INTRA_ARTICULAR

## 2021-02-13 MED ORDER — BUPIVACAINE HCL 0.5 % IJ SOLN
3.0000 mL | INTRAMUSCULAR | Status: AC | PRN
  Administered 2021-02-13: 3 mL via INTRA_ARTICULAR

## 2021-02-13 NOTE — Progress Notes (Signed)
Pt state left shoulder pain. Pt state any movement of the left arm cause her pain. Pt state she takes pain meds to help ease her pain.  Numeric Pain Rating Scale and Functional Assessment Average Pain 2   In the last MONTH (on 0-10 scale) has pain interfered with the following?  1. General activity like being  able to carry out your everyday physical activities such as walking, climbing stairs, carrying groceries, or moving a chair?  Rating(8)   -BT, -Dye Allergies.

## 2021-02-13 NOTE — Telephone Encounter (Signed)
Noted in appointment notes $375 due OOP for each visit next 3 visits to cover cost of gel injection that is not covered by patients insurance. Gel injections provided to [facility] team

## 2021-02-13 NOTE — Progress Notes (Signed)
   Tina Webb - 87 y.o. female MRN [id number]  Date of birth: 1934-05-03  Office Visit Note: Visit Date: 02/13/2021 PCP: Nadara Eaton, MD Referred by: Nadara Eaton, MD  Subjective: Chief Complaint  Patient presents with   Left Shoulder - Pain   HPI:  Tina Webb is a 87 y.o. female who comes in today For the first in a series of 3 Hyalgan injections using fluoroscopic guidance for the left shoulder.  The left minimal joint has end-stage osteoarthritis.  We have the patient scheduled for 2 more injections over the next coming 2 weeks.  She did have questions about plan after the 3 injections as far as what to expect and I have directed her to asked those questions of Dr. August Saucer since he does more of these.  I will send a message to him and his assistant.  ROS Otherwise per HPI.  Assessment & Plan: Visit Diagnoses:    ICD-10-CM   1. Chronic left shoulder pain  M25.512 XR C-ARM NO REPORT   G89.29       Plan: No additional findings.   Meds & Orders: No orders of the defined types were placed in this encounter.   Orders Placed This Encounter  Procedures   XR C-ARM NO REPORT    Follow-up: No follow-ups on file.   Procedures: Large Joint Inj: L glenohumeral on 02/13/2021 1:23 PM Indications: pain and diagnostic evaluation Details: 22 G 3.5 in needle, fluoroscopy-guided anteromedial approach  Arthrogram: No  Medications: 3 mL bupivacaine 0.5 %; 20 mg Sodium Hyaluronate 20 MG/2ML Outcome: tolerated well, no immediate complications  There was excellent flow of contrast producing a partial arthrogram of the glenohumeral joint.  Procedure, treatment alternatives, risks and benefits explained, specific risks discussed. Consent was given by the patient. Immediately prior to procedure a time out was called to verify the correct patient, procedure, equipment, support staff and site/side marked as required. Patient was prepped and draped in the usual sterile fashion.          Clinical History: CERVICAL SPINE COMPLETE WITH FLEXION AND EXTENSION VIEWS   COMPARISON:  None.   FINDINGS: Moderate thoracic kyphosis. Slight anterolisthesis C4-5. No abnormal movement on flexion or extension.   Negative for fracture or mass.   Disc degeneration and spurring C3-4, C4-5, C5-6. Asymmetric facet degeneration on the left C5-6 and C6-7 related to dextroscoliosis in the cervical spine.   IMPRESSION: Multilevel disc and facet degeneration.   Negative for fracture.  No abnormal movement.    ORTHO XR SHOULDER 2 OR MORE VW UNILATERAL, 11/11/19   Order Questions Answers  Reason for exam: pain  Laterality: Left  View(s): AP,Y View,Axillary      FINDINGS: 3 views left shoulder order interpreted in clinic today.  Overlying wheelchair is evident on exam today.  Moderate osteoarthritis and evident rotator cuff arthropathy with acromion is noted on the left shoulder.  Ulnohumeral joint is otherwise well reduced peripheral osteophyte formation.  Diagnostic Studies: All applicable diagnostic imaging related to this consultation reviewed by attending physician.     Objective:  VS:  HT:    WT:   BMI:     BP:   HR: bpm  TEMP: ( )  RESP:  Physical Exam   Imaging: No results found.

## 2021-02-19 ENCOUNTER — Ambulatory Visit: Payer: Medicare Other | Admitting: Physical Medicine and Rehabilitation

## 2021-02-20 ENCOUNTER — Ambulatory Visit: Payer: Self-pay

## 2021-02-20 ENCOUNTER — Ambulatory Visit: Payer: Medicare Other | Admitting: Physical Medicine and Rehabilitation

## 2021-02-20 ENCOUNTER — Other Ambulatory Visit: Payer: Self-pay

## 2021-02-20 ENCOUNTER — Encounter: Payer: Self-pay | Admitting: Physical Medicine and Rehabilitation

## 2021-02-20 DIAGNOSIS — M25512 Pain in left shoulder: Secondary | ICD-10-CM

## 2021-02-20 DIAGNOSIS — M19012 Primary osteoarthritis, left shoulder: Secondary | ICD-10-CM

## 2021-02-20 DIAGNOSIS — G8929 Other chronic pain: Secondary | ICD-10-CM | POA: Diagnosis not present

## 2021-02-20 NOTE — Progress Notes (Signed)
Pt state left shoulder pain. Pt state any movement of the left shoulder makes the pain worse. Pt state she takes pain meds to help ease her pain.  Numeric Pain Rating Scale and Functional Assessment Average Pain 8   In the last MONTH (on 0-10 scale) has pain interfered with the following?  1. General activity like being  able to carry out your everyday physical activities such as walking, climbing stairs, carrying groceries, or moving a chair?  Rating(10)    -BT, -Dye Allergies.

## 2021-02-20 NOTE — Progress Notes (Signed)
   Tina Webb - 87 y.o. female MRN [id number]  Date of birth: [birth year]  Office Visit Note: Visit Date: 02/20/2021 PCP: Nadara Eaton, MD Referred by: Nadara Eaton, MD  Subjective: Chief Complaint  Patient presents with   Left Shoulder - Pain   HPI:  Tina Webb is a 87 y.o. female who comes in today at the request of Dr. Burnard Bunting for planned Left glenohumeral joint injection of Hyalgan with fluoroscopic guidance.  This is injection #2.  If patient does not get much relief 1 week after the third injection she will follow-up with Dr. August Saucer for continued evaluation and management.  The patient has failed conservative care including home exercise, medications, time and activity modification.  This injection will be diagnostic and hopefully therapeutic.  Please see requesting physician notes for further details and justification.   ROS Otherwise per HPI.  Assessment & Plan: Visit Diagnoses:    ICD-10-CM   1. Arthritis of left glenohumeral joint  M19.012 XR C-ARM NO REPORT    2. Chronic left shoulder pain  M25.512 XR C-ARM NO REPORT   G89.29       Plan: No additional findings.   Meds & Orders: No orders of the defined types were placed in this encounter.   Orders Placed This Encounter  Procedures   Large Joint Inj   XR C-ARM NO REPORT    Follow-up: Return in about 2 weeks (around 03/06/2021) for # Hyalgan injection.   Procedures: Large Joint Inj: L glenohumeral on 02/20/2021 1:03 PM Indications: pain and diagnostic evaluation Details: 22 G 3.5 in needle, fluoroscopy-guided anteromedial approach  Arthrogram: No  Medications: 20 mg Sodium Hyaluronate 20 MG/2ML Outcome: tolerated well, no immediate complications  There was excellent flow of contrast producing a partial arthrogram of the glenohumeral joint.  Procedure, treatment alternatives, risks and benefits explained, specific risks discussed. Consent was given by the patient. Immediately prior to  procedure a time out was called to verify the correct patient, procedure, equipment, support staff and site/side marked as required. Patient was prepped and draped in the usual sterile fashion.         Clinical History: CERVICAL SPINE COMPLETE WITH FLEXION AND EXTENSION VIEWS   COMPARISON:  None.   FINDINGS: Moderate thoracic kyphosis. Slight anterolisthesis C4-5. No abnormal movement on flexion or extension.   Negative for fracture or mass.   Disc degeneration and spurring C3-4, C4-5, C5-6. Asymmetric facet degeneration on the left C5-6 and C6-7 related to dextroscoliosis in the cervical spine.   IMPRESSION: Multilevel disc and facet degeneration.   Negative for fracture.  No abnormal movement.    ORTHO XR SHOULDER 2 OR MORE VW UNILATERAL, 11/11/19   Order Questions Answers  Reason for exam: pain  Laterality: Left  View(s): AP,Y View,Axillary      FINDINGS: 3 views left shoulder order interpreted in clinic today.  Overlying wheelchair is evident on exam today.  Moderate osteoarthritis and evident rotator cuff arthropathy with acromion is noted on the left shoulder.  Ulnohumeral joint is otherwise well reduced peripheral osteophyte formation.  Diagnostic Studies: All applicable diagnostic imaging related to this consultation reviewed by attending physician.     Objective:  VS:  HT:    WT:   BMI:     BP:   HR: bpm  TEMP: ( )  RESP:  Physical Exam   Imaging: No results found.

## 2021-02-21 MED ORDER — SODIUM HYALURONATE (VISCOSUP) 20 MG/2ML IX SOSY
20.0000 mg | PREFILLED_SYRINGE | INTRA_ARTICULAR | Status: AC | PRN
  Administered 2021-02-20: 20 mg via INTRA_ARTICULAR

## 2021-02-26 ENCOUNTER — Ambulatory Visit: Payer: Medicare Other | Admitting: Physical Medicine and Rehabilitation

## 2021-02-27 ENCOUNTER — Ambulatory Visit: Payer: Medicare Other | Admitting: Physical Medicine and Rehabilitation

## 2021-03-01 ENCOUNTER — Ambulatory Visit (INDEPENDENT_AMBULATORY_CARE_PROVIDER_SITE_OTHER): Payer: Medicare Other | Admitting: Physical Medicine and Rehabilitation

## 2021-03-01 ENCOUNTER — Encounter: Payer: Self-pay | Admitting: Physical Medicine and Rehabilitation

## 2021-03-01 ENCOUNTER — Ambulatory Visit: Payer: Self-pay

## 2021-03-01 ENCOUNTER — Other Ambulatory Visit: Payer: Self-pay

## 2021-03-01 DIAGNOSIS — M19012 Primary osteoarthritis, left shoulder: Secondary | ICD-10-CM | POA: Diagnosis not present

## 2021-03-01 MED ORDER — SODIUM HYALURONATE (VISCOSUP) 20 MG/2ML IX SOSY
20.0000 mg | PREFILLED_SYRINGE | INTRA_ARTICULAR | Status: AC | PRN
  Administered 2021-03-01: 20 mg via INTRA_ARTICULAR

## 2021-03-01 NOTE — Progress Notes (Signed)
   Tina Webb - 87 y.o. female MRN [id number]  Date of birth: 1933/08/18  Office Visit Note: Visit Date: 03/01/2021 PCP: Nadara Eaton, MD Referred by: Nadara Eaton, MD  Subjective: Chief Complaint  Patient presents with   Left Shoulder - Pain   HPI:  Tina Webb is a 87 y.o. female who comes in today at the request of Dr. Burnard Bunting for planned Left glenohumeral injection with fluoroscopic guidance.  #3 with Hyalgan. No relief so far. The patient has failed conservative care including home exercise, medications, time and activity modification.  This injection will be diagnostic and hopefully therapeutic.  Please see requesting physician notes for further details and justification.   ROS Otherwise per HPI.  Assessment & Plan: Visit Diagnoses:    ICD-10-CM   1. Arthritis of left glenohumeral joint  M19.012 XR C-ARM NO REPORT      Plan: No additional findings.   Meds & Orders: No orders of the defined types were placed in this encounter.   Orders Placed This Encounter  Procedures   Large Joint Inj   XR C-ARM NO REPORT    Follow-up: Return in about 4 weeks (around 03/29/2021) for Burnard Bunting, MD.   Procedures: Large Joint Inj: L glenohumeral on 03/01/2021 1:08 PM Indications: pain and diagnostic evaluation Details: 22 G 3.5 in needle, fluoroscopy-guided anteromedial approach  Arthrogram: No  Medications: 20 mg Sodium Hyaluronate 20 MG/2ML Outcome: tolerated well, no immediate complications  There was excellent flow of contrast producing a partial arthrogram of the glenohumeral joint.  Procedure, treatment alternatives, risks and benefits explained, specific risks discussed. Consent was given by the patient. Immediately prior to procedure a time out was called to verify the correct patient, procedure, equipment, support staff and site/side marked as required. Patient was prepped and draped in the usual sterile fashion.         Clinical  History: CERVICAL SPINE COMPLETE WITH FLEXION AND EXTENSION VIEWS   COMPARISON:  None.   FINDINGS: Moderate thoracic kyphosis. Slight anterolisthesis C4-5. No abnormal movement on flexion or extension.   Negative for fracture or mass.   Disc degeneration and spurring C3-4, C4-5, C5-6. Asymmetric facet degeneration on the left C5-6 and C6-7 related to dextroscoliosis in the cervical spine.   IMPRESSION: Multilevel disc and facet degeneration.   Negative for fracture.  No abnormal movement.    ORTHO XR SHOULDER 2 OR MORE VW UNILATERAL, 11/11/19   Order Questions Answers  Reason for exam: pain  Laterality: Left  View(s): AP,Y View,Axillary      FINDINGS: 3 views left shoulder order interpreted in clinic today.  Overlying wheelchair is evident on exam today.  Moderate osteoarthritis and evident rotator cuff arthropathy with acromion is noted on the left shoulder.  Ulnohumeral joint is otherwise well reduced peripheral osteophyte formation.  Diagnostic Studies: All applicable diagnostic imaging related to this consultation reviewed by attending physician.     Objective:  VS:  HT:    WT:   BMI:     BP:   HR: bpm  TEMP: ( )  RESP:  Physical Exam   Imaging: No results found.

## 2021-03-01 NOTE — Progress Notes (Signed)
Here for left shoulder hyalgan injection #3. Reports no relief following previous injections.

## 2021-03-01 NOTE — Patient Instructions (Signed)

## 2021-03-06 ENCOUNTER — Ambulatory Visit (INDEPENDENT_AMBULATORY_CARE_PROVIDER_SITE_OTHER): Payer: Medicare Other | Admitting: Neurology

## 2021-03-06 ENCOUNTER — Encounter: Payer: Self-pay | Admitting: Neurology

## 2021-03-06 VITALS — BP 126/70 | HR 73 | Ht 62.0 in | Wt 112.0 lb

## 2021-03-06 DIAGNOSIS — R269 Unspecified abnormalities of gait and mobility: Secondary | ICD-10-CM | POA: Diagnosis not present

## 2021-03-06 DIAGNOSIS — N3941 Urge incontinence: Secondary | ICD-10-CM | POA: Diagnosis not present

## 2021-03-06 DIAGNOSIS — R531 Weakness: Secondary | ICD-10-CM | POA: Diagnosis not present

## 2021-03-06 DIAGNOSIS — G250 Essential tremor: Secondary | ICD-10-CM

## 2021-03-06 DIAGNOSIS — G35 Multiple sclerosis: Secondary | ICD-10-CM | POA: Diagnosis not present

## 2021-03-06 MED ORDER — METOPROLOL TARTRATE 25 MG PO TABS: 25.0000 mg | ORAL_TABLET | Freq: Two times a day (BID) | ORAL | 11 refills | Status: DC

## 2021-03-06 MED ORDER — AMANTADINE HCL 100 MG PO CAPS: 100.0000 mg | ORAL_CAPSULE | Freq: Two times a day (BID) | ORAL | 11 refills | Status: DC

## 2021-03-06 NOTE — Progress Notes (Signed)
[facility]  PATIENT: Tina Webb DOB: August 09, 1933  REFERRING DOCTOR OR PCP:  Wilhelmina Mcardle (PCP); Huel Coventry (neurology) SOURCE: Patient, notes from Dr. Excell Seltzer, imaging reports, MRI images personally reviewed.  _________________________________   HISTORICAL  CHIEF COMPLAINT:  Chief Complaint  Patient presents with   Follow-up    Rm 1, w husband. Here for yearly MS f/u, off DMT. In [facility] today. Pt reports loosing core and hand strength. Pt has been having more hand tremors, mostly R hand. Pt has a Systems analyst, going 3x a week.     HISTORY OF PRESENT ILLNESS:  Tina Webb is a 87 y.o. woman with multiple sclerosis diagnosed in 1984.  Update 03/06/2021: She is feeling worse.   She is feeling weaker.  Less core strength and arm strength.   She also notes worse hand coordination.   She has a tremor in her right hand that has worsened this past year.   Writing and makeup is more difficult.     Leg strength is about the same --- she can stand with support.     She has a motorized wheelchair at home.   She uses a Medical laboratory scientific officer wheelchair outside.   A customized back for the electric wheelchair has been made but it did not help.   She is able to transfer independently to chair and toilet by standing with support and pivoting.   She cannot use a walker.   She has right > left weakness and spasticity in legs.    She has some numbness that is not bothersome.   She has a lot of urinary urgency and takes oxybutynin with benefit but it causes a dry mouth.   Myrbetriq did not hel  She also has a torn rotator cuff on the left.   She has had 3 gel shots.   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   Mood is fine.     MS History: She was diagnosed with MS in 1984 after presenting with numbness in both legs.  She had her husband have recently moved to the [place] area from [place].  While in [place], she was seen Dr. Huel Coventry for her multiple sclerosis.    She reports that the numbness that presented in 1984 was present in both legs.  She improved over time and did not receive any steroids at that time..  Initially, she was not on any DMT but started Copaxone in the 1990's.   She had progression and over the years was also on Rebif, Tecfidera, Aubagio, Zinbryta and Gilenya.  On over the medications, she continued to have slow worsening of her disability.   She did have some definite motor and visual exacerbations in the 1980's and 1990's.  She had a severe UTI in 2018 and she stopped all DMTs at that time.    She has a bladder stimulator (not functional) and is unable to get an MRI.   Her last MRI was around 2013.  She has worked with a Systems analyst and done physical therapy.  This has helped her gain better ability to transfer.   MRI of the brain and cervical spine from 06/10/2012 was personally reviewed.  The MRI of the cervical spine showed T2 hyperintense foci adjacent to C1-C2, C4 and C7 posteriorly and adjacent to C5-C6 to the left.  Additionally there are degenerative changes at C5-C6 causing  mild spinal stenosis and right greater than left foraminal narrowing.  The MRI of the brain showed multiple T2/FLAIR hyperintense foci in the periventricular, juxtacortical and deep white matter of both hemispheres.  Additionally there is a T2 hyperintense focus anteriorly within the pontomedullary junction towards the right.  REVIEW OF SYSTEMS: Constitutional: No fevers, chills, sweats, or change in appetite.  She has fatigue.  Eyes: No visual changes, double vision, eye pain Ear, nose and throat: No hearing loss, ear pain, nasal congestion, sore throat Cardiovascular: No chest pain, palpitations Respiratory:  No shortness of breath at rest or with exertion.   No wheezes GastrointestinaI: No nausea, vomiting, diarrhea, abdominal pain, fecal incontinence Genitourinary:  Urinary frequency, urgency and multiple UTIs Musculoskeletal: She often has left shoulder pain.   Integumentary: No rash, pruritus, skin lesions Neurological: as above Psychiatric: No depression at this time.  No anxiety Endocrine: No palpitations, diaphoresis, change in appetite, change in weigh or increased thirst Hematologic/Lymphatic:  No anemia, purpura, petechiae. Allergic/Immunologic: No itchy/runny eyes, nasal congestion, recent allergic reactions, rashes  ALLERGIES: No Known Allergies  HOME MEDICATIONS:  Current Outpatient Medications:    amantadine (SYMMETREL) 100 MG capsule, Take 1 capsule (100 mg total) by mouth 2 (two) times daily., Disp: 60 capsule, Rfl: 11   AMPYRA 10 MG TB12, Take 1 tablet (10 mg total) by mouth in the morning and at bedtime. BRAND NAME AMPYRA MEDICALLY NECESSARY, Disp: 60 tablet, Rfl: 11   baclofen (LIORESAL) 10 MG tablet, Take 0.5 tablets (5 mg total) by mouth 2 (two) times daily., Disp: 90 each, Rfl: 4   BIOTIN PO, Take 100 mg by mouth in the morning, at noon, and at bedtime. Takes total of 300mg  daily, Disp: , Rfl:    clobetasol cream (TEMOVATE) 0.05 %, Apply 1 application topically as needed., Disp: , Rfl:    Cranberry-Vitamin C-Probiotic (AZO CRANBERRY PO), Take 1 tablet by mouth daily., Disp: , Rfl:    DICLOFENAC SODIUM PO, Take 100 mg by mouth as needed., Disp: , Rfl:    Esomeprazole Magnesium (NEXIUM PO), Take 1 Dose by mouth as needed., Disp: , Rfl:    estradiol (ESTRING) 2 MG vaginal ring, Place 2 mg vaginally every 3 (three) months. follow package directions, Disp: , Rfl:    gabapentin (NEURONTIN) 300 MG capsule, Take 300 mg by mouth 3 (three) times daily., Disp: , Rfl:    hydrocortisone 2.5 % cream, Apply 1 application topically as needed., Disp: , Rfl:    levothyroxine (SYNTHROID) 25 MCG tablet, Take 25 mcg by mouth daily before breakfast., Disp: , Rfl:    liothyronine (CYTOMEL) 5 MCG tablet, Take 5 mcg by mouth in the morning and at bedtime.,  Disp: , Rfl:    methenamine (HIPREX) 1 g tablet, Take 1 g by mouth daily., Disp: , Rfl:    metoprolol tartrate (LOPRESSOR) 25 MG tablet, Take 1 tablet (25 mg total) by mouth 2 (two) times daily., Disp: 30 tablet, Rfl: 11   mupirocin cream (BACTROBAN) 2 %, Apply 1 application topically as needed., Disp: , Rfl:    nitrofurantoin (MACRODANTIN) 50 MG capsule, Take 50 mg by mouth daily., Disp: , Rfl:    polyethylene glycol (MIRALAX / GLYCOLAX) 17 g packet, Take 17 g by mouth daily., Disp: , Rfl:    Probiotic Product (PROBIOTIC PO), Take 2 tablets by mouth daily., Disp: , Rfl:    rosuvastatin (CRESTOR) 20 MG tablet, Take 20 mg by mouth daily., Disp: , Rfl:    triamcinolone cream (KENALOG) 0.1 %,  Apply 1 application topically as needed., Disp: , Rfl:    VESICARE 10 MG tablet, Take 1 tablet (10 mg total) by mouth daily., Disp: 90 tablet, Rfl: 3   VITAMIN D PO, Take 4,000 Units by mouth daily., Disp: , Rfl:   PAST MEDICAL HISTORY: Past Medical History:  Diagnosis Date   Anxiety    High cholesterol    Multiple sclerosis (HCC)    Rotator cuff tear    left    PAST SURGICAL HISTORY: Past Surgical History:  Procedure Laterality Date   BLADDER SURGERY  2018   STEM placement    ULNAR NERVE REPAIR Right 2010    FAMILY HISTORY: Family History  Problem Relation Age of Onset   Heart attack Mother    Transient ischemic attack Mother    Arthritis Mother    Heart Problems Father     SOCIAL HISTORY:  Social History   Socioeconomic History   Marital status: Married    Spouse name: [name]   Number of children: 3   Years of education: College   Highest education level: Not on file  Occupational History   Occupation: Retired  Tobacco Use   Smoking status: Never   Smokeless tobacco: Never  Substance and Sexual Activity   Alcohol use: Yes    Comment: 2 drinks per week   Drug use: Never   Sexual activity: Not on file  Other Topics Concern   Not on file  Social History Narrative    Originally right handed but had surgery and now uses left hand for eating and other activities   Caffeine use: 2 cups coffee per day   Lives with husband   Lives at [place] with husband in [place]   Social Determinants of Health   Financial Resource Strain: Not on file  Food Insecurity: Not on file  Transportation Needs: Not on file  Physical Activity: Not on file  Stress: Not on file  Social Connections: Not on file  Intimate Partner Violence: Not on file     PHYSICAL EXAM  Vitals:   03/06/21 0831  BP: 126/70  Pulse: 73  Weight: 112 lb (50.8 kg)  Height: 5\' 2"  (1.575 m)    Body mass index is 20.49 kg/m.   General: The patient is well-developed and well-nourished and in no acute distress  HEENT:  Head is [place]/AT.  Sclera are anicteric.   .  Skin: Extremities are without rash but with right > left leg edema and ecchymoses bilaterally    Neurologic Exam  Mental status: The patient is alert and oriented x 3 at the time of the examination. The patient has apparent normal recent and remote memory, with an apparently normal attention span and concentration ability.   Speech is normal.  Cranial nerves: Extraocular movements are full.  Color vision is normal facial strength and sensation is normal.. No obvious hearing deficits are noted.  Motor: She has a low amplitude 6 or 7 Hz tremor in the hands, worse with intention.  Muscle bulk is reduced in the ulnar innervated hand muscles bilaterally, right worse than left.   Tone is increased in the right leg.  . Strength is 3/5 in the ulnar innervated hand muscles on the right.  Proximal muscles on the right were 4/5.  In the left arm, strength was 4 to 4+ /5.  In the legs, strength was 2+/5 in the lefthip flexors and 4 -/5 in the quadriceps and 3/5 elsewhere in the leg.  Otherwise, strength was 2/5  at the hip flexors, 2+/5 in the quadricep muscles and 2-2+ in the lower leg.  Sensory: Sensory testing was intact to touch and  vibration sensation.  Coordination: Cerebellar testing reveals good finger-nose-finger and she can't do heel-to-shin bilaterally.  Gait and station: She cannot stand or walk  Reflexes: Deep tendon reflexes are increased in legs, right > left.          ASSESSMENT AND PLAN  Multiple sclerosis (HCC)  Gait disorder  Weakness  Urge incontinence of urine  Essential tremor  1.   She will remain off of an immunologic disease modifying therapy.  She does not think that the high-dose biotin has helped.  There are some studies that show alpha lipoic acid may be of benefit.  Unfortunately, there is no available medication that ican help secondary progressive MS much  2.   She does not think she is getting a benefit from dalfampridine.  She will try a few weeks off the medication and then consider restarting if in retrospect there was a benefit.  I also added amantadine with some studies have shown some benefit for gait disturbance.   3.   Metoprolol for tremor.  Continue other medications.   4.   Return in 1 year or sooner if there are new or worsening neurologic symptoms.  41-minute office visit with the majority of the time spent face-to-face for history and physical, discussion/counseling and decision-making.  Additional time with record review and documentation.   Naksh Radi A. Epimenio Foot, MD, [facility] 03/06/2021, 1:33 PM Certified in Neurology, Clinical Neurophysiology, Sleep Medicine and Neuroimaging  [facility] [address] [place], [place] [address] [phone]

## 2021-03-08 ENCOUNTER — Encounter: Payer: Self-pay | Admitting: Orthopedic Surgery

## 2021-03-08 ENCOUNTER — Other Ambulatory Visit: Payer: Self-pay

## 2021-03-08 ENCOUNTER — Ambulatory Visit (INDEPENDENT_AMBULATORY_CARE_PROVIDER_SITE_OTHER): Payer: Medicare Other | Admitting: Orthopedic Surgery

## 2021-03-08 DIAGNOSIS — M19012 Primary osteoarthritis, left shoulder: Secondary | ICD-10-CM

## 2021-03-12 ENCOUNTER — Encounter: Payer: Self-pay | Admitting: Orthopedic Surgery

## 2021-03-12 MED ORDER — LIDOCAINE HCL 1 % IJ SOLN
5.0000 mL | INTRAMUSCULAR | Status: AC | PRN
  Administered 2021-03-08: 5 mL

## 2021-03-12 MED ORDER — BUPIVACAINE HCL 0.5 % IJ SOLN
9.0000 mL | INTRAMUSCULAR | Status: AC | PRN
  Administered 2021-03-08: 9 mL via INTRA_ARTICULAR

## 2021-03-12 NOTE — Progress Notes (Signed)
Office Visit Note   Patient: Tina Webb           Date of Birth: 07-24-1933           MRN: [id number] Visit Date: 03/08/2021 Requested by: Nadara Eaton, MD [address] [place],  [place] [address] PCP: Nadara Eaton, MD  Subjective: Chief Complaint  Patient presents with   Left Shoulder - Follow-up    HPI: Tina Webb is a patient with left shoulder pain.  She had an injection with Dr. Alvester Morin 03/01/2022.  Has continued pain.  The cortisone did not give any help.  She has had her third Hyalgan injection with Dr. Alvester Morin which also did not help.  She has essentially had an operative left shoulder arthritis and rotator cuff arthropathy due to glenoid bone loss.              ROS: All systems reviewed are negative as they relate to the chief complaint within the history of present illness.  Patient denies  fevers or chills.   Assessment & Plan: Visit Diagnoses:  1. Arthritis of left shoulder region     Plan: Impression is left shoulder pain with significant effusion in the shoulder today with no fevers or chills.  Plan is ultrasound-guided aspiration and injection of the shoulder with 9 cc of Marcaine and 1 cc of Toradol.  7-month return to repeat the aspiration and injection under ultrasound guidance.  Follow-Up Instructions: Return in about 3 months (around 06/07/2021).   Orders:  No orders of the defined types were placed in this encounter.  No orders of the defined types were placed in this encounter.     Procedures: Large Joint Inj: L glenohumeral on 03/08/2021 9:16 PM Indications: diagnostic evaluation and pain Details: 18 G 1.5 in needle, superolateral approach  Arthrogram: No  Medications: 9 mL bupivacaine 0.5 %; 5 mL lidocaine 1 % Outcome: tolerated well, no immediate complications Procedure, treatment alternatives, risks and benefits explained, specific risks discussed. Consent was given by the patient. Immediately prior to procedure a time out was called to  verify the correct patient, procedure, equipment, support staff and site/side marked as required. Patient was prepped and draped in the usual sterile fashion.      Clinical Data: No additional findings.  Objective: Vital Signs: There were no vitals taken for this visit.  Physical Exam:   Constitutional: Patient appears well-developed HEENT:  Head: Normocephalic Eyes:EOM are normal Neck: Normal range of motion Cardiovascular: Normal rate Pulmonary/chest: Effort normal Neurologic: Patient is alert Skin: Skin is warm Psychiatric: Patient has normal mood and affect   Ortho Exam: Ortho exam demonstrates full active and passive range of motion of the elbow and wrist.  She is in a wheelchair.  Left shoulder has functional deltoid but significant crepitus with active and passive range of motion.  There is an effusion in the left shoulder joint region with no lymphadenopathy.  Specialty Comments:  No specialty comments available.  Imaging: No results found.   PMFS History: Patient Active Problem List   Diagnosis Date Noted   Primary osteoarthritis of left shoulder 10/05/2020   Chronic left shoulder pain 08/10/2020   Chronic night sweats 08/09/2020   Multiple sclerosis (HCC) 11/02/2019   Gait disorder 11/02/2019   Weakness 11/02/2019   Urge incontinence of urine 11/02/2019   Foot-drop 10/06/2019   Senile osteoporosis 10/06/2019   Contracted bladder 06/17/2016   Injury of tendon of rotator cuff 04/25/2016   Neurogenic bladder 04/25/2016   Hypothyroidism  08/16/2010   Hypertrophic cardiomyopathy (HCC) 07/08/2010   Vitamin D deficiency 11/13/2009   Gastroesophageal reflux disease 08/14/2009   Past Medical History:  Diagnosis Date   Anxiety    High cholesterol    Multiple sclerosis (HCC)    Rotator cuff tear    left    Family History  Problem Relation Age of Onset   Heart attack Mother    Transient ischemic attack Mother    Arthritis Mother    Heart Problems Father      Past Surgical History:  Procedure Laterality Date   BLADDER SURGERY  2018   STEM placement    ULNAR NERVE REPAIR Right 2010   Social History   Occupational History   Occupation: Retired  Tobacco Use   Smoking status: Never   Smokeless tobacco: Never  Substance and Sexual Activity   Alcohol use: Yes    Comment: 2 drinks per week   Drug use: Never   Sexual activity: Not on file

## 2021-03-19 ENCOUNTER — Telehealth: Payer: Self-pay | Admitting: Neurology

## 2021-03-19 NOTE — Telephone Encounter (Signed)
I called the pt and LVM (ok per DPR) asking for call back to discuss her medication questions further. Advised the Amantadine was prescribed to help with her gait disturbance and the Metoprolol was prescribed to help with the tremors. Left office number for patient to call back.

## 2021-03-19 NOTE — Telephone Encounter (Signed)
I tried to reach the pt again. Left office number and hours in message for call back.

## 2021-03-19 NOTE — Telephone Encounter (Signed)
Pt called wanting to know what the metoprolol tartrate (LOPRESSOR) 25 MG tablet and amantadine (SYMMETREL) 100 MG capsule are for. She knows they are for trimmers, but she is really wanting to know how should she be taking them. Pt requesting a call back.

## 2021-03-20 NOTE — Telephone Encounter (Signed)
IF patient calls back please reiterate the Amantadine was prescribed to help with her gait disturbance and the Metoprolol was prescribed to help with the tremors.  Both are ordered to be taken twice a day so typically 1 pill in the morning and the other pill in evening.

## 2021-04-09 ENCOUNTER — Telehealth: Payer: Self-pay | Admitting: Physical Medicine and Rehabilitation

## 2021-04-09 NOTE — Telephone Encounter (Signed)
Can you advise on this

## 2021-04-09 NOTE — Telephone Encounter (Signed)
Patient called needing to schedule an appointment with Dr Alvester Morin for a cortisone injection in her left shoulder. Patient said she will be unavailable between 1:00pm and 3:30pm. The number to contact patient is [phone]

## 2021-04-09 NOTE — Telephone Encounter (Signed)
Gel injections in left shoulder in August. Please advise.

## 2021-04-10 NOTE — Telephone Encounter (Signed)
Patient had injection on 03/08/2021 in her left shoulder with shoulder with 9 cc of Marcaine and 1 cc of Toradol  and was informed to return (around 06/07/2021). Patient is requesting to have steroid injection with Dr. Alvester Morin please advise.

## 2021-04-10 NOTE — Telephone Encounter (Signed)
Ok for that thx pls arrange

## 2021-04-11 ENCOUNTER — Telehealth: Payer: Self-pay

## 2021-04-11 ENCOUNTER — Telehealth: Payer: Self-pay | Admitting: Physical Medicine and Rehabilitation

## 2021-04-11 NOTE — Telephone Encounter (Signed)
See previous message

## 2021-04-11 NOTE — Telephone Encounter (Signed)
Left message #1

## 2021-04-11 NOTE — Telephone Encounter (Signed)
Pt called and is wondering if she can go ahead and get scheduled for [place]. I seen in last message August Saucer approved it.   CB [phone]

## 2021-04-11 NOTE — Telephone Encounter (Signed)
Pt called stating she missed a call form courtney. Please call pt at [phone].

## 2021-04-11 NOTE — Telephone Encounter (Signed)
Pt called to speak with courtney regarding an apt

## 2021-04-11 NOTE — Telephone Encounter (Signed)
Left message #2

## 2021-04-12 ENCOUNTER — Telehealth: Payer: Self-pay | Admitting: Physical Medicine and Rehabilitation

## 2021-04-12 NOTE — Telephone Encounter (Signed)
Pt called returning Courtney's call and states she will be available all day.   [phone]

## 2021-04-12 NOTE — Telephone Encounter (Signed)
Scheduled. Patient has difficulty standing. 30 minute appointment. Added to wait list.

## 2021-04-19 ENCOUNTER — Ambulatory Visit: Payer: Medicare Other | Admitting: Orthopedic Surgery

## 2021-04-26 ENCOUNTER — Ambulatory Visit (INDEPENDENT_AMBULATORY_CARE_PROVIDER_SITE_OTHER): Payer: Medicare Other | Admitting: Physical Medicine and Rehabilitation

## 2021-04-26 ENCOUNTER — Encounter: Payer: Self-pay | Admitting: Physical Medicine and Rehabilitation

## 2021-04-26 ENCOUNTER — Ambulatory Visit: Payer: Self-pay

## 2021-04-26 ENCOUNTER — Other Ambulatory Visit: Payer: Self-pay

## 2021-04-26 DIAGNOSIS — R202 Paresthesia of skin: Secondary | ICD-10-CM

## 2021-04-26 DIAGNOSIS — M25512 Pain in left shoulder: Secondary | ICD-10-CM | POA: Diagnosis not present

## 2021-04-26 DIAGNOSIS — M7918 Myalgia, other site: Secondary | ICD-10-CM | POA: Diagnosis not present

## 2021-04-26 DIAGNOSIS — G8929 Other chronic pain: Secondary | ICD-10-CM | POA: Diagnosis not present

## 2021-04-26 DIAGNOSIS — M542 Cervicalgia: Secondary | ICD-10-CM | POA: Diagnosis not present

## 2021-04-26 NOTE — Progress Notes (Signed)
Pt state left shoulder pain. Pt state any movement with her left shoulder and arm makes the pain worse. Pt state she takes pain meds and uses pain cream to help ease her pain.  Numeric Pain Rating Scale and Functional Assessment Average Pain 2   In the last MONTH (on 0-10 scale) has pain interfered with the following?  1. General activity like being  able to carry out your everyday physical activities such as walking, climbing stairs, carrying groceries, or moving a chair?  Rating(8)   -BT, -Dye Allergies.

## 2021-04-30 ENCOUNTER — Telehealth: Payer: Self-pay | Admitting: Physical Medicine and Rehabilitation

## 2021-04-30 NOTE — Telephone Encounter (Signed)
Pt states she only got relief for a day and half from the injection. Now the pain has gotten worse and it feels like an electrical shock from the elbow down to her on the top of her hand.   CB [phone]

## 2021-05-08 ENCOUNTER — Encounter: Payer: Self-pay | Admitting: Physical Medicine and Rehabilitation

## 2021-05-08 ENCOUNTER — Telehealth: Payer: Self-pay | Admitting: Orthopedic Surgery

## 2021-05-08 MED ORDER — TRIAMCINOLONE ACETONIDE 40 MG/ML IJ SUSP
20.0000 mg | INTRAMUSCULAR | Status: AC | PRN
  Administered 2021-04-26: 20 mg via INTRAMUSCULAR

## 2021-05-08 MED ORDER — LIDOCAINE HCL 1 % IJ SOLN
3.0000 mL | INTRAMUSCULAR | Status: AC | PRN
  Administered 2021-04-26: 3 mL

## 2021-05-08 NOTE — Progress Notes (Signed)
Tina Webb - 87 y.o. female MRN [id number]  Date of birth: 1933/12/03  Office Visit Note: Visit Date: 04/26/2021 PCP: Javier Glazier, MD Referred by: Javier Glazier, MD  Subjective: Chief Complaint  Patient presents with   Left Shoulder - Pain   HPI: Tina Webb is a 87 y.o. female who comes in todayFor reevaluation and management of chronic worsening severe left shoulder pain now with some pain referring from the shoulder blade and neck into the dorsal lateral left arm to the forearm with some dysesthesia or paresthesia.  Her case is complicated by multiple sclerosis and severe osteoarthritis of the left glenohumeral joint.  We have referred her to Dr. Anderson Malta, MD who has been managing her shoulder.  He did complete glenohumeral joint injection and did obtain a significant amount of joint fluid from her and this did give her quite a bit of relief.  According to the patient and his notes they did make a follow-up appointment for December but her shoulder began hurting again and they did remember they could see him sooner but they had already had a follow-up appointment with me.  They also are questioning the fact that there is some referral pattern into the arm with paresthesia.  She has not had prior radiculopathy or radiculitis.  Multiple sclerosis is not really affected the arm.  She has not noted any focal weakness other than continued weakness with shoulder movement Duda pain.  No symptoms on the right.  No real symptoms in the hand.  She has a history of trigger point injections at a prior provider and we have not really completed those in our office.  She does have a massage person that works with her if she has not appointment upcoming.  Review of Systems  Musculoskeletal:  Positive for joint pain and neck pain.  Neurological:  Positive for tingling.  All other systems reviewed and are negative. Otherwise per HPI.  Assessment & Plan: Visit Diagnoses:     ICD-10-CM   1. Chronic left shoulder pain  M25.512 XR C-ARM NO REPORT   G89.29     2. Myofascial pain syndrome  M79.18     3. Paresthesia of skin  R20.2     4. Cervicalgia  M54.2        Plan: Findings:  Chronic severe recalcitrant left shoulder pain with end-stage osteoarthritis of the glenohumeral joint now managed by Dr. Anderson Malta.  Now having some pain referral into the trapezius levator scapula teres musculature and then referral into the arm with paresthesia but nothing into the hands.  This could be a radiculitis radiculopathy but I think it is more myofascial pain with referral pattern.  We did complete injections into the trigger points today to see if this would help at all diagnostically.  Would be interesting to see if the referral pattern changes after trigger point injection did rule out radiculopathy radiculitis.  If it does not seem to help with obviously look at perhaps imaging of the cervical spine.  She did have report of cervical x-ray performed in February of this year and that is reviewed in the notes.  She does have some level of scoliotic curvature Duda posture changes and being wheelchair-bound with the multiple sclerosis.  We will also see how she does with her massage therapist looking at the trigger points as well.  I have encouraged her to follow-up sooner with Dr. Marlou Sa for the shoulder if she needs an injection from him  as I really do not want to do 1 of those more frequently than we need to but I would be happy to do that for them if he needs [place] to do that.  It sounds like he did get a significant amount of joint fluid on his last injection so I think he may be better at getting into her shoulder joint and we are at this point.   Meds & Orders: No orders of the defined types were placed in this encounter.   Orders Placed This Encounter  Procedures   Trigger Point Inj   XR C-ARM NO REPORT    Follow-up: Return if symptoms worsen or fail to improve.    Procedures: Trigger Point Inj  Date/Time: 04/26/2021 9:00 AM Performed by: Tyrell Antonio, MD Authorized by: Tyrell Antonio, MD   Consent Given by:  Patient Site marked: the procedure site was marked   Timeout: prior to procedure the correct patient, procedure, and site was verified   Indications:  Pain Total # of Trigger Points:  3 or more Location: neck and back   Needle Size:  25 G Approach:  Dorsal Medications #1:  20 mg triamcinolone acetonide 40 MG/ML Medications #2:  3 mL lidocaine 1 % Additional Injections?: No   Patient tolerance:  Patient tolerated the procedure well with no immediate complications Comments: Trigger points palpated and injected with needling technique in the left trapezius and levator scapula and rhomboid and teres minor.      Clinical History: CERVICAL SPINE COMPLETE WITH FLEXION AND EXTENSION VIEWS   COMPARISON:  None.   FINDINGS: Moderate thoracic kyphosis. Slight anterolisthesis C4-5. No abnormal movement on flexion or extension.   Negative for fracture or mass.   Disc degeneration and spurring C3-4, C4-5, C5-6. Asymmetric facet degeneration on the left C5-6 and C6-7 related to dextroscoliosis in the cervical spine.   IMPRESSION: Multilevel disc and facet degeneration. Electronically Signed    By: Marlan Palau M.D.    On: 08/14/2020 09:04    Negative for fracture.  No abnormal movement.    ORTHO XR SHOULDER 2 OR MORE VW UNILATERAL, 11/11/19   Order Questions Answers  Reason for exam: pain  Laterality: Left  View(s): AP,Y View,Axillary      FINDINGS: 3 views left shoulder order interpreted in clinic today.  Overlying wheelchair is evident on exam today.  Moderate osteoarthritis and evident rotator cuff arthropathy with acromion is noted on the left shoulder.  Ulnohumeral joint is otherwise well reduced peripheral osteophyte formation.  Diagnostic Studies: All applicable diagnostic imaging related to this consultation  reviewed by attending physician.   She reports that she has never smoked. She has never used smokeless tobacco. No results for input(s): HGBA1C, LABURIC in the last 8760 hours.  Objective:  VS:  HT:    WT:   BMI:     BP:   HR: bpm  TEMP: ( )  RESP:  Physical Exam Vitals and nursing note reviewed.  Constitutional:      General: She is not in acute distress.    Appearance: Normal appearance. She is not ill-appearing.  HENT:     Head: Normocephalic and atraumatic.     Right Ear: External ear normal.     Left Ear: External ear normal.  Eyes:     Extraocular Movements: Extraocular movements intact.  Cardiovascular:     Rate and Rhythm: Normal rate.     Pulses: Normal pulses.  Musculoskeletal:        General: Tenderness  present.     Cervical back: Neck supple. Tenderness present. No rigidity.     Right lower leg: No edema.     Left lower leg: No edema.     Comments: Patient has good strength in the upper extremities including 5 out of 5 strength in wrist extension long finger flexion and APB.  There is no atrophy of the hands intrinsically.  There is a negative Hoffmann's test.  She does sit with a left forward lean with increased kyphotic curvature of the thoracic spine and left leaning cervical spine.  She does have palpable trigger points in the levator scapula and trapezius and rhomboid and teres minor.  She has decreased range of motion of the left shoulder with increased pain with external rotation.   Lymphadenopathy:     Cervical: No cervical adenopathy.  Skin:    Findings: No erythema, lesion or rash.  Neurological:     General: No focal deficit present.     Mental Status: She is alert and oriented to person, place, and time.     Sensory: No sensory deficit.     Motor: No weakness or abnormal muscle tone.     Coordination: Coordination normal.  Psychiatric:        Mood and Affect: Mood normal.        Behavior: Behavior normal.    Ortho Exam  Imaging: No results  found.  Past Medical/Family/Surgical/Social History: Medications & Allergies reviewed per EMR, new medications updated. Patient Active Problem List   Diagnosis Date Noted   Primary osteoarthritis of left shoulder 10/05/2020   Chronic left shoulder pain 08/10/2020   Chronic night sweats 08/09/2020   Multiple sclerosis ([place]) 11/02/2019   Gait disorder 11/02/2019   Weakness 11/02/2019   Urge incontinence of urine 11/02/2019   Foot-drop 10/06/2019   Senile osteoporosis 10/06/2019   Contracted bladder 06/17/2016   Injury of tendon of rotator cuff 04/25/2016   Neurogenic bladder 04/25/2016   Hypothyroidism 08/16/2010   Hypertrophic cardiomyopathy ([place]) 07/08/2010   Vitamin D deficiency 11/13/2009   Gastroesophageal reflux disease 08/14/2009   Past Medical History:  Diagnosis Date   Anxiety    High cholesterol    Multiple sclerosis ([place])    Rotator cuff tear    left   Family History  Problem Relation Age of Onset   Heart attack Mother    Transient ischemic attack Mother    Arthritis Mother    Heart Problems Father    Past Surgical History:  Procedure Laterality Date   BLADDER SURGERY  2018   STEM placement    ULNAR NERVE REPAIR Right 2010   Social History   Occupational History   Occupation: Retired  Tobacco Use   Smoking status: Never   Smokeless tobacco: Never  Substance and Sexual Activity   Alcohol use: Yes    Comment: 2 drinks per week   Drug use: Never   Sexual activity: Not on file

## 2021-05-08 NOTE — Telephone Encounter (Signed)
Pt was calling what Dr. August Saucer and Dr. Alvester Morin have decided what her new steps are since they are working together on her treatment. Pt also had a question about being prescribed Naysa. Pt states a friend of hers has been given Naysa ointment and has had immediate relief and wants to know if she can be prescribed it as well. The best call back number is [phone].

## 2021-05-09 NOTE — Telephone Encounter (Signed)
Okay for that ointment.  Regarding treatment for the shoulder we really do not have great options other than injections every 3 to 4 months.

## 2021-05-10 NOTE — Telephone Encounter (Signed)
Prob okay for cortisone injection on 11/9 since her last actual cortisone injection was in July but it's up to Dr. August Saucer

## 2021-05-10 NOTE — Telephone Encounter (Signed)
I spoke with patient and advised per Dr Diamantina Providence note. She has 2 scheduled appts one for 11/9 and one for 12/8.  Wanted to know which she was supposed to keep. She is wanting repeat asp/inj of shoulder but your last OV note from 09/01 states:   Plan is ultrasound-guided aspiration and injection of the shoulder with 9 cc of Marcaine and 1 cc of Toradol.  61-month return to repeat the aspiration and injection under ultrasound guidance.  Please advise if you want her to come in next Wednesday or if best to hold off until 12/08?  I advised likely needed to wait til December but she was insistent on this being run by you.

## 2021-05-11 NOTE — Telephone Encounter (Signed)
Holding to clarify with Dr August Saucer on Monday.

## 2021-05-14 ENCOUNTER — Telehealth: Payer: Self-pay

## 2021-05-14 NOTE — Telephone Encounter (Signed)
Discussed with patient. She decided she will cancel this week and keep appt for Dec.

## 2021-05-14 NOTE — Telephone Encounter (Signed)
Pt called stating she spoke with someone last week and they were suppose to be back with her about keeping her apt for this Wednesday the 9th with Dr. August Saucer or cancelling and keeping the apt in December on the 8th

## 2021-05-14 NOTE — Telephone Encounter (Signed)
Okay for either.

## 2021-05-14 NOTE — Telephone Encounter (Signed)
Discussed. See other note.

## 2021-05-16 ENCOUNTER — Ambulatory Visit: Payer: Medicare Other | Admitting: Orthopedic Surgery

## 2021-05-17 ENCOUNTER — Ambulatory Visit: Payer: Medicare Other | Admitting: Neurology

## 2021-05-24 ENCOUNTER — Other Ambulatory Visit: Payer: Self-pay | Admitting: Neurology

## 2021-05-29 ENCOUNTER — Other Ambulatory Visit: Payer: Self-pay | Admitting: Neurology

## 2021-05-29 ENCOUNTER — Telehealth: Payer: Self-pay | Admitting: Neurology

## 2021-05-29 MED ORDER — SOLIFENACIN SUCCINATE 10 MG PO TABS: 10.0000 mg | ORAL_TABLET | Freq: Every day | ORAL | 3 refills | Status: DC

## 2021-05-29 NOTE — Telephone Encounter (Signed)
Pt called back requesting to speak to the nurse. Pt did not want to give information on why she needs the call back just that it's regarding her Solifenacin Succinate

## 2021-05-29 NOTE — Telephone Encounter (Signed)
Pt called, would like a call from nurse to discuss why refill for Solifenacin Succinate was refused.

## 2021-05-29 NOTE — Telephone Encounter (Signed)
Called pt back. Advised it was denied d/t [place] sending refill back on 12/27/20 #90 w/ 3 refills (1 yr supply). She will call pharmacy back to further inquire.

## 2021-05-29 NOTE — Telephone Encounter (Signed)
Called the patient back. She is stating that the mail pharmacy needs me to resend the script for generic name of the vesicare for 90 day supply. I have completed this for her

## 2021-06-14 ENCOUNTER — Ambulatory Visit: Payer: Self-pay

## 2021-06-14 ENCOUNTER — Other Ambulatory Visit: Payer: Self-pay

## 2021-06-14 ENCOUNTER — Ambulatory Visit (INDEPENDENT_AMBULATORY_CARE_PROVIDER_SITE_OTHER): Payer: Medicare Other | Admitting: Orthopedic Surgery

## 2021-06-14 DIAGNOSIS — M19012 Primary osteoarthritis, left shoulder: Secondary | ICD-10-CM | POA: Diagnosis not present

## 2021-06-15 ENCOUNTER — Encounter: Payer: Self-pay | Admitting: Orthopedic Surgery

## 2021-06-15 MED ORDER — LIDOCAINE HCL 1 % IJ SOLN
5.0000 mL | INTRAMUSCULAR | Status: AC | PRN
Start: 2021-06-14 — End: 2021-06-14
  Administered 2021-06-14: 5 mL

## 2021-06-15 MED ORDER — BUPIVACAINE HCL 0.5 % IJ SOLN
9.0000 mL | INTRAMUSCULAR | Status: AC | PRN
Start: 2021-06-14 — End: 2021-06-14
  Administered 2021-06-14: 9 mL via INTRA_ARTICULAR

## 2021-06-15 MED ORDER — METHYLPREDNISOLONE ACETATE 40 MG/ML IJ SUSP
40.0000 mg | INTRAMUSCULAR | Status: AC | PRN
Start: 2021-06-14 — End: 2021-06-14
  Administered 2021-06-14: 40 mg via INTRA_ARTICULAR

## 2021-06-15 NOTE — Progress Notes (Signed)
Office Visit Note   Patient: Tina Webb           Date of Birth: Jan 24, 1934           MRN: [id number] Visit Date: 06/14/2021 Requested by: Nadara Eaton, MD [address] [place],  [place] [address] PCP: Nadara Eaton, MD  Subjective: Chief Complaint  Patient presents with   Shoulder Pain    HPI: Tina Webb is an 87 year old patient with end-stage left shoulder arthritis.  She is not a candidate for reconstructive surgery.  She is using CBD oil on the shoulder with some relief.  She reports some radiation of pain to the wrist and hand dorsally which are new symptoms.  Would like to have shoulder injection and aspiration today.              ROS: All systems reviewed are negative as they relate to the chief complaint within the history of present illness.  Patient denies  fevers or chills.   Assessment & Plan: Visit Diagnoses:  1. Arthritis of left shoulder region     Plan: Impression is left shoulder arthritis with significant fluid collection present.  Plan is aspiration under ultrasound guidance with injection of cortisone.  Risk and benefits discussed.  Patient would like to do this 3 times a year.  We will see her back in 4 months for repeat aspiration and injection.  Follow-Up Instructions: Return in about 4 months (around 10/13/2021).   Orders:  Orders Placed This Encounter  Procedures   US Guided Needle Placement - No Linked Charges   No orders of the defined types were placed in this encounter.     Procedures: Large Joint Inj: L glenohumeral on 06/14/2021 10:30 PM Indications: diagnostic evaluation and pain Details: 18 G 1.5 in needle, ultrasound-guided posterior approach  Arthrogram: No  Medications: 9 mL bupivacaine 0.5 %; 40 mg methylPREDNISolone acetate 40 MG/ML; 5 mL lidocaine 1 % Aspirate: 40 mL serous Outcome: tolerated well, no immediate complications Procedure, treatment alternatives, risks and benefits explained, specific risks discussed.  Consent was given by the patient. Immediately prior to procedure a time out was called to verify the correct patient, procedure, equipment, support staff and site/side marked as required. Patient was prepped and draped in the usual sterile fashion.      Clinical Data: No additional findings.  Objective: Vital Signs: There were no vitals taken for this visit.  Physical Exam:   Constitutional: Patient appears well-developed HEENT:  Head: Normocephalic Eyes:EOM are normal Neck: Normal range of motion Cardiovascular: Normal rate Pulmonary/chest: Effort normal Neurologic: Patient is alert Skin: Skin is warm Psychiatric: Patient has normal mood and affect     Glenohumeral joint effusion is present which is most palpable in the anterior aspect of the shoulder.  Coarseness is present with passive range of motion of the shoulder which is painful for the patient.  Patient also has a certain degree of neck and upper thoracic and cervical spine deformity.  She is in a wheelchair.  Ortho Exam: Ortho exam demonstrates full active and passive range of motion of the elbow on the left-hand side.  She has limited motion but functional deltoid.  Specialty Comments:  No specialty comments available.  Imaging: No results found.   PMFS History: Patient Active Problem List   Diagnosis Date Noted   Primary osteoarthritis of left shoulder 10/05/2020   Chronic left shoulder pain 08/10/2020   Chronic night sweats 08/09/2020   Multiple sclerosis ([facility]) 11/02/2019   Gait  disorder 11/02/2019   Weakness 11/02/2019   Urge incontinence of urine 11/02/2019   Foot-drop 10/06/2019   Senile osteoporosis 10/06/2019   Contracted bladder 06/17/2016   Injury of tendon of rotator cuff 04/25/2016   Neurogenic bladder 04/25/2016   Hypothyroidism 08/16/2010   Hypertrophic cardiomyopathy ([place]) 07/08/2010   Vitamin D deficiency 11/13/2009   Gastroesophageal reflux disease 08/14/2009   Past Medical History:   Diagnosis Date   Anxiety    High cholesterol    Multiple sclerosis ([facility])    Rotator cuff tear    left    Family History  Problem Relation Age of Onset   Heart attack Mother    Transient ischemic attack Mother    Arthritis Mother    Heart Problems Father     Past Surgical History:  Procedure Laterality Date   BLADDER SURGERY  2018   STEM placement    ULNAR NERVE REPAIR Right 2010   Social History   Occupational History   Occupation: Retired  Tobacco Use   Smoking status: Never   Smokeless tobacco: Never  Substance and Sexual Activity   Alcohol use: Yes    Comment: 2 drinks per week   Drug use: Never   Sexual activity: Not on file

## 2021-07-18 ENCOUNTER — Telehealth: Payer: Self-pay | Admitting: Neurology

## 2021-07-18 NOTE — Telephone Encounter (Addendum)
Submitted PA on CMM. Key: [id number] - PA Case ID: [id number]. Waiting on determination from [facility].

## 2021-07-18 NOTE — Telephone Encounter (Signed)
PA approved 07/07/2022 through [facility]

## 2021-07-18 NOTE — Telephone Encounter (Signed)
Pt called states her insurance is needing a new PA for the AMPYRA 10 MG TB12. Pt requesting a call back once this has been done.

## 2021-07-18 NOTE — Telephone Encounter (Signed)
Called, LVM for pt letting her know PA approved and to call back if she has any further questions.

## 2021-07-27 ENCOUNTER — Other Ambulatory Visit (HOSPITAL_COMMUNITY): Payer: Self-pay | Admitting: Surgery

## 2021-07-27 ENCOUNTER — Other Ambulatory Visit: Payer: Self-pay | Admitting: Surgery

## 2021-07-27 DIAGNOSIS — K429 Umbilical hernia without obstruction or gangrene: Secondary | ICD-10-CM

## 2021-08-02 ENCOUNTER — Ambulatory Visit (HOSPITAL_COMMUNITY)
Admission: RE | Admit: 2021-08-02 | Discharge: 2021-08-02 | Disposition: A | Discharge status: Home / Self Care | Payer: Medicare Other | Source: Ambulatory Visit | Attending: Surgery | Admitting: Surgery

## 2021-08-02 ENCOUNTER — Other Ambulatory Visit: Payer: Self-pay

## 2021-08-02 DIAGNOSIS — K429 Umbilical hernia without obstruction or gangrene: Secondary | ICD-10-CM | POA: Diagnosis present

## 2021-08-02 MED ORDER — IOHEXOL 300 MG/ML  SOLN
80.0000 mL | Freq: Once | INTRAMUSCULAR | Status: AC | PRN
  Administered 2021-08-02: 80 mL via INTRAVENOUS

## 2021-08-14 ENCOUNTER — Telehealth: Payer: Self-pay | Admitting: Neurology

## 2021-08-14 NOTE — Telephone Encounter (Signed)
Called and spoke w/ pt. Relayed Dr. Garth Bigness message. She verbalized understanding.

## 2021-08-14 NOTE — Telephone Encounter (Signed)
Pt called stating she was diagnosed with Covid and her nursing home is wanting to give her Lagevrio and she is wanting to know if it is ok to take that with her having MS. Pt states that if she can not take this medication then she would like to be advised on what she can take.

## 2021-08-14 NOTE — Telephone Encounter (Signed)
Dr. Epimenio Foot- are you ok with her taking recommended med?

## 2021-08-16 ENCOUNTER — Other Ambulatory Visit: Payer: Self-pay | Admitting: *Deleted

## 2021-08-16 DIAGNOSIS — G35 Multiple sclerosis: Secondary | ICD-10-CM

## 2021-08-16 DIAGNOSIS — R269 Unspecified abnormalities of gait and mobility: Secondary | ICD-10-CM

## 2021-08-16 MED ORDER — AMPYRA 10 MG PO TB12: 10.0000 mg | ORAL_TABLET | Freq: Two times a day (BID) | ORAL | 11 refills | Status: DC

## 2021-08-23 ENCOUNTER — Encounter: Payer: Self-pay | Admitting: Neurology

## 2021-08-23 ENCOUNTER — Other Ambulatory Visit: Payer: Self-pay

## 2021-08-23 ENCOUNTER — Ambulatory Visit (INDEPENDENT_AMBULATORY_CARE_PROVIDER_SITE_OTHER): Payer: Medicare Other | Admitting: Neurology

## 2021-08-23 VITALS — BP 107/53 | HR 57 | Ht 62.0 in | Wt 112.0 lb

## 2021-08-23 DIAGNOSIS — G250 Essential tremor: Secondary | ICD-10-CM | POA: Diagnosis not present

## 2021-08-23 DIAGNOSIS — R269 Unspecified abnormalities of gait and mobility: Secondary | ICD-10-CM

## 2021-08-23 DIAGNOSIS — N3941 Urge incontinence: Secondary | ICD-10-CM

## 2021-08-23 DIAGNOSIS — G35 Multiple sclerosis: Secondary | ICD-10-CM

## 2021-08-23 DIAGNOSIS — G801 Spastic diplegic cerebral palsy: Secondary | ICD-10-CM

## 2021-08-23 MED ORDER — BACLOFEN 10 MG PO TABS
5.0000 mg | ORAL_TABLET | Freq: Two times a day (BID) | ORAL | 4 refills | Status: DC
Start: 2021-08-23 — End: 2022-10-09

## 2021-08-23 NOTE — Progress Notes (Signed)
[facility]  PATIENT: Tina Webb DOB: [birth year]  REFERRING DOCTOR OR PCP:  Wilhelmina Mcardle (PCP); Huel Coventry (neurology) SOURCE: Patient, notes from Dr. Excell Seltzer, imaging reports, MRI images personally reviewed.  _________________________________   HISTORICAL  CHIEF COMPLAINT:  Chief Complaint  Patient presents with   Follow-up    Rm 1, alone. Here for 6 month MS f/u, off DMT. Pt reports feeling weaker. In electric WC today.     HISTORY OF PRESENT ILLNESS:  Tina Webb is a 88 y.o. woman with multiple sclerosis diagnosed in 1984.  Update 08/23/2021 She is feeling weaker than she felt at her last visit.   She uses an Scientist, clinical (histocompatibility and immunogenetics).   She is often able to transfer independently from her chair but sometimes needs help.  Arm strength is stable but she has a torn rotator cuff not operable).   She gets q5month shots into the shoulder tha helps the pain some.    Ampyra had helped strength.   She never tried amantadine.     Metoprolol was started and has helped the tremor and fine control some.    She also notes worse hand coordination.   She has a tremor in her right hand that has worsened this past year.   Writing and makeup is more difficult.     She cannot use a walker.   She has right > left weakness and spasticity in legs.    She has some numbness that is not bothersome or painful.   She has a lot of urinary urgency and takes solifenacin which is a little better than oxybutynn.   She still dribbles some.  She has constipation despite fiber and Miralax.  Sh gets edema and she uses a leg lift function on her wheelchair with some benefit.   She has a trainer come to help exercise a few times a week.     She has mild fatigue.   Mood is fine.    Cognition is doing well  She had Covid and was much more tired during Covid and still feels a little weaker  MS History: She was diagnosed with MS in 1984 after presenting with numbness in both legs.  She  had her husband have recently moved to the [place] area from [place].  While in [place], she was seen Dr. Huel Coventry for her multiple sclerosis.    She reports that the numbness that presented in 1984 was present in both legs.  She improved over time and did not receive any steroids at that time..  Initially, she was not on any DMT but started Copaxone in the 1990's.   She had progression and over the years was also on Rebif, Tecfidera, Aubagio, Zinbryta and Gilenya.  On over the medications, she continued to have slow worsening of her disability.   She did have some definite motor and visual exacerbations in the 1980's and 1990's.  She had a severe UTI in 2018 and she stopped all DMTs at that time.    She has a bladder stimulator (not functional) and is unable to get an MRI.   Her last MRI was around 2013.  She has worked with a Systems analyst and done physical therapy.  This has helped her gain better ability to transfer.   MRI of the brain and cervical spine from 06/10/2012 was personally reviewed.  The MRI of the cervical spine showed T2 hyperintense foci adjacent to C1-C2, C4 and C7 posteriorly and adjacent to C5-C6 to the  left.  Additionally there are degenerative changes at C5-C6 causing mild spinal stenosis and right greater than left foraminal narrowing.  The MRI of the brain showed multiple T2/FLAIR hyperintense foci in the periventricular, juxtacortical and deep white matter of both hemispheres.  Additionally there is a T2 hyperintense focus anteriorly within the pontomedullary junction towards the right.  REVIEW OF SYSTEMS: Constitutional: No fevers, chills, sweats, or change in appetite.  She has fatigue.  Eyes: No visual changes, double vision, eye pain Ear, nose and throat: No hearing loss, ear pain, nasal congestion, sore throat Cardiovascular: No chest pain, palpitations Respiratory:  No shortness of breath at rest or with exertion.   No wheezes GastrointestinaI: No nausea,  vomiting, diarrhea, abdominal pain, fecal incontinence Genitourinary: Urinary frequency, urgency and multiple UTIs Musculoskeletal: She often has left shoulder pain.   Integumentary: No rash, pruritus, skin lesions Neurological: as above Psychiatric: No depression at this time.  No anxiety Endocrine: No palpitations, diaphoresis, change in appetite, change in weigh or increased thirst Hematologic/Lymphatic:  No anemia, purpura, petechiae. Allergic/Immunologic: No itchy/runny eyes, nasal congestion, recent allergic reactions, rashes  ALLERGIES: No Known Allergies  HOME MEDICATIONS:  Current Outpatient Medications:    AMPYRA 10 MG TB12, Take 1 tablet (10 mg total) by mouth in the morning and at bedtime. BRAND NAME AMPYRA MEDICALLY NECESSARY, Disp: 60 tablet, Rfl: 11   clobetasol cream (TEMOVATE) 0.05 %, Apply 1 application topically as needed., Disp: , Rfl:    Cranberry-Vitamin C-Probiotic (AZO CRANBERRY PO), Take 1 tablet by mouth daily., Disp: , Rfl:    DICLOFENAC SODIUM PO, Take 100 mg by mouth as needed., Disp: , Rfl:    Esomeprazole Magnesium (NEXIUM PO), Take 1 Dose by mouth as needed., Disp: , Rfl:    estradiol (ESTRING) 2 MG vaginal ring, Place 2 mg vaginally every 3 (three) months. follow package directions, Disp: , Rfl:    gabapentin (NEURONTIN) 300 MG capsule, Take 300 mg by mouth 3 (three) times daily., Disp: , Rfl:    hydrocortisone 2.5 % cream, Apply 1 application topically as needed., Disp: , Rfl:    levothyroxine (SYNTHROID) 25 MCG tablet, Take 25 mcg by mouth daily before breakfast., Disp: , Rfl:    liothyronine (CYTOMEL) 5 MCG tablet, Take 5 mcg by mouth in the morning and at bedtime., Disp: , Rfl:    methenamine (HIPREX) 1 g tablet, Take 1 g by mouth daily., Disp: , Rfl:    metoprolol tartrate (LOPRESSOR) 25 MG tablet, Take 1 tablet (25 mg total) by mouth 2 (two) times daily., Disp: 30 tablet, Rfl: 11   mupirocin cream (BACTROBAN) 2 %, Apply 1 application topically as  needed., Disp: , Rfl:    nitrofurantoin (MACRODANTIN) 50 MG capsule, Take 50 mg by mouth daily., Disp: , Rfl:    polyethylene glycol (MIRALAX / GLYCOLAX) 17 g packet, Take 17 g by mouth daily., Disp: , Rfl:    Probiotic Product (PROBIOTIC PO), Take 2 tablets by mouth daily., Disp: , Rfl:    rosuvastatin (CRESTOR) 20 MG tablet, Take 20 mg by mouth daily., Disp: , Rfl:    solifenacin (VESICARE) 10 MG tablet, TAKE 1 TABLET EVERY DAY, Disp: 90 tablet, Rfl: 3   triamcinolone cream (KENALOG) 0.1 %, Apply 1 application topically as needed., Disp: , Rfl:    VITAMIN D PO, Take 4,000 Units by mouth daily., Disp: , Rfl:    baclofen (LIORESAL) 10 MG tablet, Take 0.5 tablets (5 mg total) by mouth 2 (two) times daily., Disp: 90  each, Rfl: 4  PAST MEDICAL HISTORY: Past Medical History:  Diagnosis Date   Anxiety    High cholesterol    Multiple sclerosis (HCC)    Rotator cuff tear    left    PAST SURGICAL HISTORY: Past Surgical History:  Procedure Laterality Date   BLADDER SURGERY  2018   STEM placement    ULNAR NERVE REPAIR Right 2010    FAMILY HISTORY: Family History  Problem Relation Age of Onset   Heart attack Mother    Transient ischemic attack Mother    Arthritis Mother    Heart Problems Father     SOCIAL HISTORY:  Social History   Socioeconomic History   Marital status: Married    Spouse name: [name]   Number of children: 3   Years of education: College   Highest education level: Not on file  Occupational History   Occupation: Retired  Tobacco Use   Smoking status: Never   Smokeless tobacco: Never  Substance and Sexual Activity   Alcohol use: Yes    Comment: 2 drinks per week   Drug use: Never   Sexual activity: Not on file  Other Topics Concern   Not on file  Social History Narrative   Originally right handed but had surgery and now uses left hand for eating and other activities   Caffeine use: 2 cups coffee per day   Lives with husband   Lives at [place] with  husband in [place]   Social Determinants of Health   Financial Resource Strain: Not on file  Food Insecurity: Not on file  Transportation Needs: Not on file  Physical Activity: Not on file  Stress: Not on file  Social Connections: Not on file  Intimate Partner Violence: Not on file     PHYSICAL EXAM  Vitals:   08/23/21 1115  BP: (!) 107/53  Pulse: (!) 57  Weight: 112 lb (50.8 kg)  Height:  (1.575 m)    Body mass index is 20.49 kg/m.   General: The patient is well-developed and well-nourished and in no acute distress  HEENT:  Head is [place]/AT.  Sclera are anicteric.   .  Skin: Extremities are without rash but she has bilateral pedal edema    Neurologic Exam  Mental status: The patient is alert and oriented x 3 at the time of the examination. The patient has apparent normal recent and remote memory, with an apparently normal attention span and concentration ability.   Speech is normal.  Cranial nerves: Extraocular movements are full.  Color vision is normal facial strength and sensation is normal.. No obvious hearing deficits are noted.  Motor: She has a low amplitude 6 or 7 Hz tremor in the hands, worse with intention.  Muscle bulk is reduced in the ulnar innervated hand muscles bilaterally, right worse than left.   Tone is increased in the right leg.  . Strength is 3/5 in the ulnar innervated hand muscles on the right.  Proximal muscles on the right 4/5.  In the left arm, strength was 4 to 4+ /5.  In the legs, strength was 2+/5 in the lefthip flexors and 4 -/5 in the quadriceps and 3/5 elsewhere in the leg.  Otherwise, strength was 2/5 at the hip flexors, 2+/5 in the quadricep muscles and 2-2+ in the lower leg.  Sensory: Sensory testing was intact to touch and vibration sensation in the legs..  Coordination: Cerebellar testing reveals good finger-nose-finger and she can't do heel-to-shin bilaterally.  Gait and  station: She cannot stand or walk  Reflexes: Deep  tendon reflexes are increased in legs, right > left.          ASSESSMENT AND PLAN  Multiple sclerosis (HCC)  Gait disorder  Urge incontinence of urine  Essential tremor  Spastic diplegia (HCC)  1.   She has an active secondary progressive multiple sclerosis and has had slow progression.  She will remain off of a disease modifying therapy.   2.   She felt some benefit from Ampyra and will continue..   3.   Continue metoprolol for tremor.  Continue baclofen and other medications.   4.   Return in 1 year or sooner if there are new or worsening neurologic symptoms.   42-minute office visit with the majority of the time spent face-to-face for history and physical, discussion/counseling and decision-making.  Additional time with record review and documentation.  Cliford Sequeira A. Epimenio Foot, MD, [facility] 08/23/2021, 6:25 PM Certified in Neurology, Clinical Neurophysiology, Sleep Medicine and Neuroimaging  [facility] [address] [place], [place] [address] [phone]

## 2021-09-04 ENCOUNTER — Ambulatory Visit: Payer: Medicare Other | Admitting: Neurology

## 2021-09-06 ENCOUNTER — Other Ambulatory Visit: Payer: Self-pay

## 2021-09-06 ENCOUNTER — Ambulatory Visit (INDEPENDENT_AMBULATORY_CARE_PROVIDER_SITE_OTHER): Payer: Medicare Other | Admitting: Orthopedic Surgery

## 2021-09-06 DIAGNOSIS — M19012 Primary osteoarthritis, left shoulder: Secondary | ICD-10-CM

## 2021-09-07 ENCOUNTER — Encounter: Payer: Self-pay | Admitting: Orthopedic Surgery

## 2021-09-07 MED ORDER — METHYLPREDNISOLONE ACETATE 40 MG/ML IJ SUSP
40.0000 mg | INTRAMUSCULAR | Status: AC | PRN
  Administered 2021-09-06: 40 mg via INTRA_ARTICULAR

## 2021-09-07 MED ORDER — BUPIVACAINE HCL 0.5 % IJ SOLN
9.0000 mL | INTRAMUSCULAR | Status: AC | PRN
  Administered 2021-09-06: 9 mL via INTRA_ARTICULAR

## 2021-09-07 MED ORDER — LIDOCAINE HCL 1 % IJ SOLN
5.0000 mL | INTRAMUSCULAR | Status: AC | PRN
  Administered 2021-09-06: 5 mL

## 2021-09-07 NOTE — Progress Notes (Signed)
? ?Office Visit Note ?  ?Patient: Tina Webb           ?Date of Birth: Mar 13, 1934           ?MRN: [id number] ?Visit Date: 09/06/2021 ?Requested by: Javier Glazier, MD ?[address] ?[place],  [place] [address] ?PCP: Javier Glazier, MD ? ?Subjective: ?Chief Complaint  ?Patient presents with  ? Left Shoulder - Pain  ? ? ?HPI: Tina Webb is an 88 year old patient with severe left shoulder arthritis which has not operative.  She had aspiration and injection on 06/14/2021 which gave her several days of relief.  She develops effusion in the shoulder as well.  She is in a wheelchair most of the time.  She is with her husband today.  Denies any fevers or chills or any interval history of injury. ?             ?ROS: All systems reviewed are negative as they relate to the chief complaint within the history of present illness.  Patient denies  fevers or chills. ? ? ?Assessment & Plan: ?Visit Diagnoses:  ?1. Arthritis of left shoulder region   ? ? ?Plan: Impression is end-stage and operative left shoulder arthritis due to glenoid bone loss.  Effusion is a little less today than it was prior visit.  Plan is ultrasound-guided aspiration and injection of the shoulder joint today.  We will plan to see her back in 3 months for clinical recheck.  We did aspirate about 60 cc from the shoulder joint through a lateral approach aiming anteriorly. ? ?Follow-Up Instructions: Return in about 3 months (around 12/07/2021).  ? ?Orders:  ?No orders of the defined types were placed in this encounter. ? ?No orders of the defined types were placed in this encounter. ? ? ? ? Procedures: ?Large Joint Inj: L glenohumeral on 09/06/2021 10:25 PM ?Indications: diagnostic evaluation and pain ?Details: 18 G 1.5 in needle, lateral approach ? ?Arthrogram: No ? ?Medications: 9 mL bupivacaine 0.5 %; 40 mg methylPREDNISolone acetate 40 MG/ML; 5 mL lidocaine 1 % ?Outcome: tolerated well, no immediate complications ?Procedure, treatment alternatives, risks and  benefits explained, specific risks discussed. Consent was given by the patient. Immediately prior to procedure a time out was called to verify the correct patient, procedure, equipment, support staff and site/side marked as required. Patient was prepped and draped in the usual sterile fashion.  ? ? ? ? ?Clinical Data: ?No additional findings. ? ?Objective: ?Vital Signs: There were no vitals taken for this visit. ? ?Physical Exam:  ? ?Constitutional: Patient appears well-developed ?HEENT:  ?Head: Normocephalic ?Eyes:EOM are normal ?Neck: Normal range of motion ?Cardiovascular: Normal rate ?Pulmonary/chest: Effort normal ?Neurologic: Patient is alert ?Skin: Skin is warm ?Psychiatric: Patient has normal mood and affect ? ? ?Ortho Exam: Ortho exam demonstrates painful range of motion of that left shoulder joint with effusion present.  No lymphadenopathy or warmth to the left shoulder region.  Deltoid is functional.  Motor or sensory function to the hand is intact. ? ?Specialty Comments:  ?No specialty comments available. ? ?Imaging: ?No results found. ? ? ?PMFS History: ?Patient Active Problem List  ? Diagnosis Date Noted  ? Essential tremor 08/23/2021  ? 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 Chronic left shoulder pain 08/10/2020  ? Chronic night sweats 08/09/2020  ? Multiple sclerosis ([place]) 11/02/2019  ? Gait disorder 11/02/2019  ? Weakness 11/02/2019  ? Urge incontinence of urine 11/02/2019  ? Foot-drop 10/06/2019  ? Senile osteoporosis 10/06/2019  ?  Contracted bladder 06/17/2016  ? Injury of tendon of rotator cuff 04/25/2016  ? Neurogenic bladder 04/25/2016  ? Hypothyroidism 08/16/2010  ? Hypertrophic cardiomyopathy ([place]) 07/08/2010  ? Vitamin D deficiency 11/13/2009  ? Gastroesophageal reflux disease 08/14/2009  ? ?Past Medical History:  ?Diagnosis Date  ? Anxiety   ? High cholesterol   ? Multiple sclerosis ([place])   ? Rotator cuff tear   ? left  ?  ?Family History  ?Problem Relation Age of Onset  ?  Heart attack Mother   ? Transient ischemic attack Mother   ? Arthritis Mother   ? Heart Problems Father   ?  ?Past Surgical History:  ?Procedure Laterality Date  ? BLADDER SURGERY  2018  ? STEM placement   ? ULNAR NERVE REPAIR Right 2010  ? ?Social History  ? ?Occupational History  ? Occupation: Retired  ?Tobacco Use  ? Smoking status: Never  ? Smokeless tobacco: Never  ?Substance and Sexual Activity  ? Alcohol use: Yes  ?  Comment: 2 drinks per week  ? Drug use: Never  ? Sexual activity: Not on file  ? ? ? ? ? ?

## 2021-09-17 ENCOUNTER — Ambulatory Visit: Payer: Medicare Other | Admitting: Neurology

## 2021-09-28 ENCOUNTER — Telehealth: Payer: Self-pay | Admitting: Neurology

## 2021-09-28 NOTE — Telephone Encounter (Signed)
Pt called stating that she is needing the RN to renew her Rx for her metoprolol tartrate (LOPRESSOR) 25 MG tablet and have it sent to [facility] mail order for a 90 day quantity. ?

## 2021-10-01 MED ORDER — METOPROLOL TARTRATE 25 MG PO TABS: 25.0000 mg | ORAL_TABLET | Freq: Two times a day (BID) | ORAL | 3 refills | Status: DC

## 2021-10-01 NOTE — Telephone Encounter (Signed)
E-scribed rx to [facility] as requested.  ?

## 2021-10-10 ENCOUNTER — Ambulatory Visit (INDEPENDENT_AMBULATORY_CARE_PROVIDER_SITE_OTHER): Payer: Medicare Other

## 2021-10-10 ENCOUNTER — Ambulatory Visit (INDEPENDENT_AMBULATORY_CARE_PROVIDER_SITE_OTHER): Payer: Medicare Other | Admitting: Orthopedic Surgery

## 2021-10-10 DIAGNOSIS — M25522 Pain in left elbow: Secondary | ICD-10-CM | POA: Diagnosis not present

## 2021-10-10 DIAGNOSIS — M5412 Radiculopathy, cervical region: Secondary | ICD-10-CM | POA: Diagnosis not present

## 2021-10-12 ENCOUNTER — Encounter: Payer: Self-pay | Admitting: Orthopedic Surgery

## 2021-10-12 NOTE — Progress Notes (Signed)
? ?Office Visit Note ?  ?Patient: Tina Webb           ?Date of Birth: 1934-04-29           ?MRN: [id number] ?Visit Date: 10/10/2021 ?Requested by: Nadara Eaton, MD ?[address] ?[place],  [place] [address] ?PCP: Nadara Eaton, MD ? ?Subjective: ?Chief Complaint  ?Patient presents with  ? Other  ?   ?Left arm pain  ? ? ?HPI: [name] is a 88 year old patient with known left shoulder and operative arthritis.  She states that her arm is "killing her" she reports some elbow pain which is deep pain.  Denies any history of injury or trauma.  Does have a history of right-sided ulnar nerve surgery but this pain does not feel like the type of pain she was having on the right-hand side.  The pain comes and goes.  Shocks last from 5 minutes to an hour.  Denies much in the way of hand weakness.  Has minimal neck pain.  Does have scoliosis which puts her neck in a somewhat compressive position for that left side.  She does describe a history of epidural steroid injections in her neck in the past. ?             ?ROS: All systems reviewed are negative as they relate to the chief complaint within the history of present illness.  Patient denies  fevers or chills. ? ? ?Assessment & Plan: ?Visit Diagnoses:  ?1. Pain in left elbow   ?2. Radiculopathy, cervical region   ? ? ?Plan: Impression is left arm pain in a patient who does have altered neck anatomy from scoliosis which could be giving her some foraminal compression.  The elbow exam and radiographs are normal.  Negative Tinel's and no subluxation of the ulnar nerve.  This looks more like nerve pain as opposed to a structural problem.  Could also be related to altered compressed anatomy in the shoulder region but she does not really have any effusion in the shoulder at this time.  We will get nerve conduction study to evaluiculopathy and follow-up after those studies.SDNORMALate left-sided radiculopathy and CT scan of the C-spine to evaluate left-sided radiculopathy.   Follow-up after those studies.  Follow-Up Instructions: No follow-ups on file.  ? ?Orders:  ?Orders Placed This Encounter  ?Procedures  ? XR Elbow 2 Views Left  ? CT CERVICAL SPINE WO CONTRAST  ? Ambulatory referral to Physical Medicine Rehab  ? ?No orders of the defined types were placed in this encounter. ? ? ? ? Procedures: ?No procedures performed ? ? ?Clinical Data: ?No additional findings. ? ?Objective: ?Vital Signs: There were no vitals taken for this visit. ? ?Physical Exam:  ? ?Constitutional: Patient appears well-developed ?HEENT:  ?Head: Normocephalic ?Eyes:EOM are normal ?Neck: Normal range of motion ?Cardiovascular: Normal rate ?Pulmonary/chest: Effort normal ?Neurologic: Patient is alert ?Skin: Skin is warm ?Psychiatric: Patient has normal mood and affect ? ? ?Ortho Exam: Ortho exam demonstrates 5 out of 5 grip EPL FPL interosseous wrist flexion extension bicep triceps and deltoid strength bilaterally.  No effusion in the left elbow.  Negative Tinel's and no mobility of the ulnar nerve out of the cubital tunnel.  Elbow range of motion is full with pronation supination flexion and extension.  No masses swelling or bruising noted in that left elbow region ? ?Specialty Comments:  ?No specialty comments available. ? ?Imaging: ?No results found. ? ? ?PMFS History: ?Patient Active Problem List  ? Diagnosis  Date Noted  ? Essential tremor 08/23/2021  ? 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 Chronic left shoulder pain 08/10/2020  ? Chronic night sweats 08/09/2020  ? Multiple sclerosis (HCC) 11/02/2019  ? Gait disorder 11/02/2019  ? Weakness 11/02/2019  ? Urge incontinence of urine 11/02/2019  ? Foot-drop 10/06/2019  ? Senile osteoporosis 10/06/2019  ? Contracted bladder 06/17/2016  ? Injury of tendon of rotator cuff 04/25/2016  ? Neurogenic bladder 04/25/2016  ? Hypothyroidism 08/16/2010  ? Hypertrophic cardiomyopathy (HCC) 07/08/2010  ? Vitamin D deficiency 11/13/2009  ? Gastroesophageal reflux  disease 08/14/2009  ? ?Past Medical History:  ?Diagnosis Date  ? Anxiety   ? High cholesterol   ? Multiple sclerosis (HCC)   ? Rotator cuff tear   ? left  ?  ?Family History  ?Problem Relation Age of Onset  ? Heart attack Mother   ? Transient ischemic attack Mother   ? Arthritis Mother   ? Heart Problems Father   ?  ?Past Surgical History:  ?Procedure Laterality Date  ? BLADDER SURGERY  2018  ? STEM placement   ? ULNAR NERVE REPAIR Right 2010  ? ?Social History  ? ?Occupational History  ? Occupation: Retired  ?Tobacco Use  ? Smoking status: Never  ? Smokeless tobacco: Never  ?Substance and Sexual Activity  ? Alcohol use: Yes  ?  Comment: 2 drinks per week  ? Drug use: Never  ? Sexual activity: Not on file  ? ? ? ? ? ?

## 2021-10-15 ENCOUNTER — Telehealth: Payer: Self-pay | Admitting: Orthopedic Surgery

## 2021-10-15 NOTE — Telephone Encounter (Signed)
Please call the pt to sch appointment  ?

## 2021-10-16 ENCOUNTER — Ambulatory Visit (HOSPITAL_BASED_OUTPATIENT_CLINIC_OR_DEPARTMENT_OTHER)
Admission: RE | Admit: 2021-10-16 | Discharge: 2021-10-16 | Disposition: A | Discharge status: Home / Self Care | Payer: Medicare Other | Source: Ambulatory Visit | Attending: Orthopedic Surgery | Admitting: Orthopedic Surgery

## 2021-10-16 DIAGNOSIS — M5412 Radiculopathy, cervical region: Secondary | ICD-10-CM | POA: Diagnosis present

## 2021-11-09 ENCOUNTER — Encounter: Payer: Self-pay | Admitting: Physical Medicine and Rehabilitation

## 2021-11-09 ENCOUNTER — Ambulatory Visit (INDEPENDENT_AMBULATORY_CARE_PROVIDER_SITE_OTHER): Payer: Medicare Other | Admitting: Physical Medicine and Rehabilitation

## 2021-11-09 DIAGNOSIS — R202 Paresthesia of skin: Secondary | ICD-10-CM | POA: Diagnosis not present

## 2021-11-12 ENCOUNTER — Ambulatory Visit (INDEPENDENT_AMBULATORY_CARE_PROVIDER_SITE_OTHER): Payer: Medicare Other | Admitting: Orthopedic Surgery

## 2021-11-12 ENCOUNTER — Encounter: Payer: Self-pay | Admitting: Orthopedic Surgery

## 2021-11-12 DIAGNOSIS — R202 Paresthesia of skin: Secondary | ICD-10-CM | POA: Diagnosis not present

## 2021-11-12 NOTE — Procedures (Signed)
EMG & NCV Findings: ?Evaluation of the left median motor nerve showed prolonged distal onset latency (5.4 ms), reduced amplitude (2.0 mV), and decreased conduction velocity (Elbow-Wrist, 44 m/s).  The left ulnar motor nerve showed prolonged distal onset latency (5.2 ms) and decreased conduction velocity (A Elbow-B Elbow, 16 m/s).  The left median (across palm) sensory nerve showed prolonged distal peak latency (Wrist, 4.5 ms) and prolonged distal peak latency (Palm, 2.4 ms).  The left ulnar sensory nerve showed prolonged distal peak latency (4.8 ms) and decreased conduction velocity (Wrist-5th Digit, 29 m/s).  All remaining nerves (as indicated in the following tables) were within normal limits.   ? ?All examined muscles (as indicated in the following table) showed no evidence of electrical instability.   ? ?Impression: ?The above electrodiagnostic study is ABNORMAL and reveals evidence of: ?a mod/severe left ulnar nerve entrapment at the elbow (cubital tunnel syndrome) affecting sensory and motor components.  This seems to fit her current symptoms in terms of elbow pain and potential shoulder pain. ? ?a severe left median nerve entrapment at the wrist (carpal tunnel syndrome) affecting sensory and motor components.  Determination of severity it is somewhat modified today by technical artifact. ? ?There is no significant electrodiagnostic evidence of any other focal nerve entrapment, brachial plexopathy or cervical radiculopathy.  ? ?Recommendations: ?1.  Continue current management of symptoms. ?2.  Follow-up with referring physician. ? ?___________________________ ?Laurence Spates FAAPMR ?Board Certified, Tax adviser of Physical Medicine and Rehabilitation ? ? ? ?Nerve Conduction Studies ?Anti Sensory Summary Table ? ? Stim Site NR Peak (ms) Norm Peak (ms) P-T Amp (?V) Norm P-T Amp Site1 Site2 Delta-P (ms) Dist (cm) Vel (m/s) Norm Vel (m/s)  ?Left Median Acr Palm Anti Sensory (2nd Digit)  29?C  ?Wrist    *4.5 <3.6  27.5 >10 Wrist Palm 2.1 0.0    ?Palm    *2.4 <2.0 26.2         ?Left Radial Anti Sensory (Base 1st Digit)  28.8?C  ?Wrist    2.6 <3.1 18.1  Wrist Base 1st Digit 2.6 0.0    ?Left Ulnar Anti Sensory (5th Digit)  29?C  ?Wrist    *4.8 <3.7 23.4 >15.0 Wrist 5th Digit 4.8 14.0 *29 >38  ? ?Motor Summary Table ? ? Stim Site NR Onset (ms) Norm Onset (ms) O-P Amp (mV) Norm O-P Amp Site1 Site2 Delta-0 (ms) Dist (cm) Vel (m/s) Norm Vel (m/s)  ?Left Median Motor (Abd Poll Brev)  28.7?C  ?Wrist    *5.4 <4.2 *2.0 >5 Elbow Wrist 4.1 18.0 *44 >50  ?Elbow    9.5  1.8         ?Left Ulnar Motor (Abd Dig Min)  28.7?C  ?Wrist    *5.2 <4.2 3.1 >3 B Elbow Wrist 3.5 19.5 56 >53  ?B Elbow    8.7  2.4  A Elbow B Elbow 6.2 10.0 *16 >53  ?A Elbow    14.9  0.6         ? ?EMG ? ? Side Muscle Nerve Root Ins Act Fibs Psw Amp Dur Poly Recrt Int Fraser Din Comment  ?Left 1stDorInt Ulnar C8-T1 Nml Nml Nml Nml Nml 0 Nml Nml   ?Left Abd Poll Brev Median C8-T1 Nml Nml Nml Nml Nml 0 Nml Nml   ?Left ExtDigCom   Nml Nml Nml Nml Nml 0 Nml Nml   ?Left Triceps Radial C6-7-8 Nml Nml Nml Nml Nml 0 Nml Nml   ?Left Deltoid Axillary C5-6 Nml Nml  Nml Nml Nml 0 Nml Nml   ? ? ?Nerve Conduction Studies ?Anti Sensory Left/Right Comparison ? ? Stim Site L Lat (ms) R Lat (ms) L-R Lat (ms) L Amp (?V) R Amp (?V) L-R Amp (%) Site1 Site2 L Vel (m/s) R Vel (m/s) L-R Vel (m/s)  ?Median Acr Palm Anti Sensory (2nd Digit)  29?C  ?Wrist *4.5   27.5   Wrist Palm     ?Palm *2.4   26.2         ?Radial Anti Sensory (Base 1st Digit)  28.8?C  ?Wrist 2.6   18.1   Wrist Base 1st Digit     ?Ulnar Anti Sensory (5th Digit)  29?C  ?Wrist *4.8   23.4   Wrist 5th Digit *29    ? ?Motor Left/Right Comparison ? ? Stim Site L Lat (ms) R Lat (ms) L-R Lat (ms) L Amp (mV) R Amp (mV) L-R Amp (%) Site1 Site2 L Vel (m/s) R Vel (m/s) L-R Vel (m/s)  ?Median Motor (Abd Poll Brev)  28.7?C  ?Wrist *5.4   *2.0   Elbow Wrist *44    ?Elbow 9.5   1.8         ?Ulnar Motor (Abd Dig Min)  28.7?C  ?Wrist *5.2   3.1   B Elbow  Wrist 56    ?B Elbow 8.7   2.4   A Elbow B Elbow *16    ?A Elbow 14.9   0.6         ? ? ? ?Waveforms: ?    ? ?   ? ? ?

## 2021-11-12 NOTE — Progress Notes (Signed)
? ?Office Visit Note ?  ?Patient: Tina Webb           ?Date of Birth: [birth year]           ?MRN: [id number] ?Visit Date: 11/12/2021 ?Requested by: Javier Glazier, MD ?[address] ?[place],  [place] [address] ?PCP: Javier Glazier, MD ? ?Subjective: ?Chief Complaint  ?Patient presents with  ? Spine - Pain  ? ? ?HPI: Tina Webb is an 88 year old patient with left arm radicular type pain.  Since she was last seen she has had a CT scan as well as nerve study.  CT scan shows moderate or severe degenerative neuroforaminal stenosis at the left C3-C7 nerve levels.  Nerve study demonstrates severe left carpal tunnel syndrome and moderate to severe cubital tunnel syndrome.  She does not report redevelopment of left shoulder effusion.  Her husband has recently had back surgery and they are currently in assisted living. ?             ?ROS: All systems reviewed are negative as they relate to the chief complaint within the history of present illness.  Patient denies  fevers or chills. ? ? ?Assessment & Plan: ?Visit Diagnoses: No diagnosis found. ? ?Plan: Impression is left-sided arm radiculopathy with patient describing at x80% of her symptoms below the elbow and 20% of her symptoms above the elbow.  I think her neck is not really treatable at this time.  I do think if she needed some type of relief.  Carpal tunnel release and or ulnar nerve decompression could be considered.  She spends a lot of time in the wheelchair with the left arm on the armrest which could be giving her some of her nerve symptoms.  She is going to try a volar wrist splint.  Stockinettes provided today.  She will let us know if she wants to proceed with carpal tunnel release which I think would be the easiest option for her severe carpal tunnel syndrome to help with some of her arm symptoms ? ?Follow-Up Instructions: Return if symptoms worsen or fail to improve.  ? ?Orders:  ?No orders of the defined types were placed in this encounter. ? ?No orders of  the defined types were placed in this encounter. ? ? ? ? Procedures: ?No procedures performed ? ? ?Clinical Data: ?No additional findings. ? ?Objective: ?Vital Signs: There were no vitals taken for this visit. ? ?Physical Exam:  ? ?Constitutional: Patient appears well-developed ?HEENT:  ?Head: Normocephalic ?Eyes:EOM are normal ?Neck: Normal range of motion ?Cardiovascular: Normal rate ?Pulmonary/chest: Effort normal ?Neurologic: Patient is alert ?Skin: Skin is warm ?Psychiatric: Patient has normal mood and affect ? ? ?Ortho Exam: Ortho exam demonstrates intact EPL FPL interosseous strength on the left.  Interosseous is strong on the left than the right.  Negative Tinel's cubital tunnel in that left elbow.  She does have mild abductor pollicis brevis wasting on the left but it is more severe on the right.  No effusion in the shoulder.  Radial pulse intact bilaterally. ? ?Specialty Comments:  ?No specialty comments available. ? ?Imaging: ?No results found. ? ? ?PMFS History: ?Patient Active Problem List  ? Diagnosis Date Noted  ? Essential tremor 08/23/2021  ? 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 Chronic left shoulder pain 08/10/2020  ? Chronic night sweats 08/09/2020  ? Multiple sclerosis ([place]) 11/02/2019  ? Gait disorder 11/02/2019  ? Weakness 11/02/2019  ? Urge incontinence of urine 11/02/2019  ? Foot-drop 10/06/2019  ?  Senile osteoporosis 10/06/2019  ? Contracted bladder 06/17/2016  ? Injury of tendon of rotator cuff 04/25/2016  ? Neurogenic bladder 04/25/2016  ? Hypothyroidism 08/16/2010  ? Hypertrophic cardiomyopathy ([place]) 07/08/2010  ? Vitamin D deficiency 11/13/2009  ? Gastroesophageal reflux disease 08/14/2009  ? ?Past Medical History:  ?Diagnosis Date  ? Anxiety   ? High cholesterol   ? Multiple sclerosis ([place])   ? Rotator cuff tear   ? left  ?  ?Family History  ?Problem Relation Age of Onset  ? Heart attack Mother   ? Transient ischemic attack Mother   ? Arthritis Mother   ? Heart Problems  Father   ?  ?Past Surgical History:  ?Procedure Laterality Date  ? BLADDER SURGERY  2018  ? STEM placement   ? ULNAR NERVE REPAIR Right 2010  ? ?Social History  ? ?Occupational History  ? Occupation: Retired  ?Tobacco Use  ? Smoking status: Never  ? Smokeless tobacco: Never  ?Substance and Sexual Activity  ? Alcohol use: Yes  ?  Comment: 2 drinks per week  ? Drug use: Never  ? Sexual activity: Not on file  ? ? ? ? ? ?

## 2021-11-12 NOTE — Progress Notes (Signed)
? ?Tina Webb - 88 y.o. female MRN [id number]  Date of birth: [birth year] ? ?Office Visit Note: ?Visit Date: 11/09/2021 ?PCP: Nadara Eaton, MD ?Referred by: Cammy Copa, MD ? ?Subjective: ?Chief Complaint  ?Patient presents with  ? Neck - Pain  ? EMG  ? ?HPI:  Tina Webb is a 88 y.o. female who comes in today at the request of Dr. Burnard Bunting for electrodiagnostic study of the Left upper extremities.  Patient is Right hand dominant.  She is well-known to me through previous shoulder injections.  She has recent CT scan of the cervical spine showing multilevel foraminal narrowing with some curvature Duda to likely chronic postural alterations with multiple sclerosis.  Patient is in motorized wheelchair.  She reports paresthesias more in the ulnar distribution but can be the whole hand.  She does get some pain up into the elbow.  No prior electrodiagnostic studies to review. ? ?ROS Otherwise per HPI. ? ?Assessment & Plan: ?Visit Diagnoses:  ?  ICD-10-CM   ?1. Paresthesia of skin  R20.2 NCV with EMG (electromyography)  ?  ?  ?Plan: Impression: ?The above electrodiagnostic study is ABNORMAL and reveals evidence of: ?a mod/severe left ulnar nerve entrapment at the elbow (cubital tunnel syndrome) affecting sensory and motor components.  This seems to fit her current symptoms in terms of elbow pain and potential shoulder pain. ? ?a severe left median nerve entrapment at the wrist (carpal tunnel syndrome) affecting sensory and motor components.  Determination of severity it is somewhat modified today by technical artifact. ? ?There is no significant electrodiagnostic evidence of any other focal nerve entrapment, brachial plexopathy or cervical radiculopathy.  ? ?Recommendations: ?1.  Continue current management of symptoms. ?2.  Follow-up with referring physician. ? ?Meds & Orders: No orders of the defined types were placed in this encounter. ?  ?Orders Placed This Encounter  ?Procedures  ? NCV with  EMG (electromyography)  ?  ?Follow-up: Return for  Burnard Bunting, MD.  ? ?Procedures: ?No procedures performed  ?EMG & NCV Findings: ?Evaluation of the left median motor nerve showed prolonged distal onset latency (5.4 ms), reduced amplitude (2.0 mV), and decreased conduction velocity (Elbow-Wrist, 44 m/s).  The left ulnar motor nerve showed prolonged distal onset latency (5.2 ms) and decreased conduction velocity (A Elbow-B Elbow, 16 m/s).  The left median (across palm) sensory nerve showed prolonged distal peak latency (Wrist, 4.5 ms) and prolonged distal peak latency (Palm, 2.4 ms).  The left ulnar sensory nerve showed prolonged distal peak latency (4.8 ms) and decreased conduction velocity (Wrist-5th Digit, 29 m/s).  All remaining nerves (as indicated in the following tables) were within normal limits.   ? ?All examined muscles (as indicated in the following table) showed no evidence of electrical instability.   ? ?Impression: ?The above electrodiagnostic study is ABNORMAL and reveals evidence of: ?a mod/severe left ulnar nerve entrapment at the elbow (cubital tunnel syndrome) affecting sensory and motor components.  This seems to fit her current symptoms in terms of elbow pain and potential shoulder pain. ? ?a severe left median nerve entrapment at the wrist (carpal tunnel syndrome) affecting sensory and motor components.  Determination of severity it is somewhat modified today by technical artifact. ? ?There is no significant electrodiagnostic evidence of any other focal nerve entrapment, brachial plexopathy or cervical radiculopathy.  ? ?Recommendations: ?1.  Continue current management of symptoms. ?2.  Follow-up with referring physician. ? ?___________________________ ?Naaman Plummer FAAPMR ?Board Certified, Biomedical engineer of Physical  Medicine and Rehabilitation ? ? ? ?Nerve Conduction Studies ?Anti Sensory Summary Table ? ? Stim Site NR Peak (ms) Norm Peak (ms) P-T Amp (?V) Norm P-T Amp Site1 Site2 Delta-P  (ms) Dist (cm) Vel (m/s) Norm Vel (m/s)  ?Left Median Acr Palm Anti Sensory (2nd Digit)  29?C  ?Wrist    *4.5 <3.6 27.5 >10 Wrist Palm 2.1 0.0    ?Palm    *2.4 <2.0 26.2         ?Left Radial Anti Sensory (Base 1st Digit)  28.8?C  ?Wrist    2.6 <3.1 18.1  Wrist Base 1st Digit 2.6 0.0    ?Left Ulnar Anti Sensory (5th Digit)  29?C  ?Wrist    *4.8 <3.7 23.4 >15.0 Wrist 5th Digit 4.8 14.0 *29 >38  ? ?Motor Summary Table ? ? Stim Site NR Onset (ms) Norm Onset (ms) O-P Amp (mV) Norm O-P Amp Site1 Site2 Delta-0 (ms) Dist (cm) Vel (m/s) Norm Vel (m/s)  ?Left Median Motor (Abd Poll Brev)  28.7?C  ?Wrist    *5.4 <4.2 *2.0 >5 Elbow Wrist 4.1 18.0 *44 >50  ?Elbow    9.5  1.8         ?Left Ulnar Motor (Abd Dig Min)  28.7?C  ?Wrist    *5.2 <4.2 3.1 >3 B Elbow Wrist 3.5 19.5 56 >53  ?B Elbow    8.7  2.4  A Elbow B Elbow 6.2 10.0 *16 >53  ?A Elbow    14.9  0.6         ? ?EMG ? ? Side Muscle Nerve Root Ins Act Fibs Psw Amp Dur Poly Recrt Int Dennie Bible Comment  ?Left 1stDorInt Ulnar C8-T1 Nml Nml Nml Nml Nml 0 Nml Nml   ?Left Abd Poll Brev Median C8-T1 Nml Nml Nml Nml Nml 0 Nml Nml   ?Left ExtDigCom   Nml Nml Nml Nml Nml 0 Nml Nml   ?Left Triceps Radial C6-7-8 Nml Nml Nml Nml Nml 0 Nml Nml   ?Left Deltoid Axillary C5-6 Nml Nml Nml Nml Nml 0 Nml Nml   ? ? ?Nerve Conduction Studies ?Anti Sensory Left/Right Comparison ? ? Stim Site L Lat (ms) R Lat (ms) L-R Lat (ms) L Amp (?V) R Amp (?V) L-R Amp (%) Site1 Site2 L Vel (m/s) R Vel (m/s) L-R Vel (m/s)  ?Median Acr Palm Anti Sensory (2nd Digit)  29?C  ?Wrist *4.5   27.5   Wrist Palm     ?Palm *2.4   26.2         ?Radial Anti Sensory (Base 1st Digit)  28.8?C  ?Wrist 2.6   18.1   Wrist Base 1st Digit     ?Ulnar Anti Sensory (5th Digit)  29?C  ?Wrist *4.8   23.4   Wrist 5th Digit *29    ? ?Motor Left/Right Comparison ? ? Stim Site L Lat (ms) R Lat (ms) L-R Lat (ms) L Amp (mV) R Amp (mV) L-R Amp (%) Site1 Site2 L Vel (m/s) R Vel (m/s) L-R Vel (m/s)  ?Median Motor (Abd Poll Brev)  28.7?C  ?Wrist *5.4    *2.0   Elbow Wrist *44    ?Elbow 9.5   1.8         ?Ulnar Motor (Abd Dig Min)  28.7?C  ?Wrist *5.2   3.1   B Elbow Wrist 56    ?B Elbow 8.7   2.4   A Elbow B Elbow *16    ?A Elbow 14.9   0.6         ? ? ? ?  Waveforms: ?    ? ?   ? ?  ? ?Clinical History: ?No specialty comments available.  ? ? ? ?Objective:  VS:  HT:    WT:   BMI:     BP:   HR: bpm  TEMP: ( )  RESP:  ?Physical Exam ?Musculoskeletal:  ?   Cervical back: Neck supple. Tenderness present.  ?   Comments: Patient confined chronically to wheelchair and motorized wheelchair.  She has significant atrophy of the left shoulder with some subluxation appearance.  She does sit with curvature particularly in the neck to the left.  She has intact sensation in the left upper extremity in all dermatomes and peripheral nerve distributions.  She has a positive Tinel's over the left ulnar groove.  She appears to have some flattening of the left APB compared to the right.  ?  ? ?Imaging: ?No results found. ?

## 2021-11-14 ENCOUNTER — Ambulatory Visit: Payer: Medicare Other | Admitting: Orthopedic Surgery

## 2021-11-28 ENCOUNTER — Telehealth: Payer: Self-pay | Admitting: Orthopedic Surgery

## 2021-11-28 NOTE — Telephone Encounter (Signed)
Pt called requesting a call from Dr. August Saucer or PA Franky Macho about upcoming appt. Pt states she is unsure if appt is needed. Please call pt at [phone].

## 2021-11-28 NOTE — Telephone Encounter (Signed)
Spoke with patient.  She would like to postpone her appointment until July.  Can you arrange an appointment for the first or second week of July to see Dr. August Saucer and call her with this appointment date/time?  You can cancel her current June appointment.  Thank you

## 2021-11-28 NOTE — Telephone Encounter (Signed)
Called patient left message to return call to reschedule her appointment per [facility] to the first or second week of July.

## 2021-12-07 ENCOUNTER — Ambulatory Visit: Payer: Medicare Other | Admitting: Orthopedic Surgery

## 2021-12-12 ENCOUNTER — Ambulatory Visit: Payer: Medicare Other | Admitting: Orthopedic Surgery

## 2022-01-09 ENCOUNTER — Ambulatory Visit: Payer: Medicare Other | Admitting: Orthopedic Surgery

## 2022-01-10 ENCOUNTER — Ambulatory Visit: Payer: Medicare Other | Admitting: Orthopedic Surgery

## 2022-01-24 ENCOUNTER — Ambulatory Visit: Payer: Medicare Other | Admitting: Orthopedic Surgery

## 2022-01-28 ENCOUNTER — Telehealth: Payer: Self-pay | Admitting: *Deleted

## 2022-01-28 DIAGNOSIS — N3941 Urge incontinence: Secondary | ICD-10-CM

## 2022-01-28 NOTE — Telephone Encounter (Signed)
Faxed completed/signed PA Vesicare to [facility] at [phone]. Received fax confirmation. Waiting on determination.

## 2022-01-29 MED ORDER — GEMTESA 75 MG PO TABS: 1.0000 | ORAL_TABLET | Freq: Every day | ORAL | 1 refills | Status: DC

## 2022-01-29 NOTE — Telephone Encounter (Signed)
Called pt back. Relayed below message. She states she feels solifenacin is not helping as much as it was.  Would like to try Gemtesa to see if this is more effective. E-scribed Gemtesa to [facility] per pt request. States 90 days supply allows her to pay 0 dollar copay typically.  Aware it may require PA. If it does, we will work on this for her.

## 2022-01-29 NOTE — Addendum Note (Signed)
Addended by: Arther Abbott on: 01/29/2022 05:10 PM   Modules accepted: Orders

## 2022-01-29 NOTE — Telephone Encounter (Signed)
Pt is requesting a call to find out about the good rx coupon.

## 2022-01-29 NOTE — Telephone Encounter (Signed)
LVM for pt to call.PA Vesicare denied. She has to try/fail Gemtesa, tolterodine ER and Gala Murdoch first before they will continue to cover.  Per Dr. Epimenio Foot, he would recommend Gemtesa 75mg  po qd to take the place of Vesicare.  Alternative option if she wants to stay on Vesicare is to fill rx using goodrx coupon. At [facility] for 90 days supply its about 23/24 dollars/[facility] about 38/39.

## 2022-01-31 MED ORDER — TOLTERODINE TARTRATE ER 4 MG PO CP24: 4.0000 mg | ORAL_CAPSULE | Freq: Every day | ORAL | 1 refills | Status: DC

## 2022-01-31 NOTE — Telephone Encounter (Signed)
Called [facility] and spoke w/ Hillary. She ran test claim of Gemtesa 75mg . 90 days supply: $75.80. No PA needed. Prescription has already been cx on their end.   I called pt back. She does not want to go on Gemtesa d/t high cost. Asked about going back on oxybutynin (she had extreme dry mouth on this). Advised there are two other options on formulary: tolterodine ER and Toviaz. She is agreeable to have call in tolterodine instead to see what cost will be for this one. She will call back if cost to high for this as well.

## 2022-01-31 NOTE — Telephone Encounter (Signed)
Per MD, call in tolterodine ER 4mg  po qd. E-scribed rx for #90, 1 refill to .

## 2022-01-31 NOTE — Telephone Encounter (Signed)
Pt states [facility] wants $72.00 she wants to use  [facility] [employer] [phone]

## 2022-01-31 NOTE — Addendum Note (Signed)
Addended by: Arther Abbott on: 01/31/2022 10:52 AM   Modules accepted: Orders

## 2022-01-31 NOTE — Addendum Note (Signed)
Addended by: Arther Abbott on: 01/31/2022 12:59 PM   Modules accepted: Orders

## 2022-02-01 ENCOUNTER — Telehealth: Payer: Self-pay | Admitting: Neurology

## 2022-02-01 NOTE — Telephone Encounter (Signed)
Pt called wanting to inform RN that the pt will be having her tolterodine (DETROL LA) 4 MG 24 hr capsule delivered to her and for the 3 month supply it was $16.43.

## 2022-02-04 NOTE — Telephone Encounter (Signed)
Noted  

## 2022-02-25 ENCOUNTER — Emergency Department (HOSPITAL_COMMUNITY)
Admission: EM | Admit: 2022-02-25 | Discharge: 2022-02-25 | Disposition: A | Discharge status: Home / Self Care | Payer: Medicare Other | Attending: Emergency Medicine | Admitting: Emergency Medicine

## 2022-02-25 ENCOUNTER — Emergency Department (HOSPITAL_COMMUNITY): Payer: Medicare Other

## 2022-02-25 ENCOUNTER — Other Ambulatory Visit: Payer: Self-pay

## 2022-02-25 DIAGNOSIS — R42 Dizziness and giddiness: Secondary | ICD-10-CM | POA: Diagnosis not present

## 2022-02-25 DIAGNOSIS — R5383 Other fatigue: Secondary | ICD-10-CM | POA: Insufficient documentation

## 2022-02-25 DIAGNOSIS — R600 Localized edema: Secondary | ICD-10-CM | POA: Insufficient documentation

## 2022-02-25 DIAGNOSIS — R011 Cardiac murmur, unspecified: Secondary | ICD-10-CM | POA: Diagnosis not present

## 2022-02-25 DIAGNOSIS — Y92009 Unspecified place in unspecified non-institutional (private) residence as the place of occurrence of the external cause: Secondary | ICD-10-CM | POA: Diagnosis not present

## 2022-02-25 DIAGNOSIS — S81812A Laceration without foreign body, left lower leg, initial encounter: Secondary | ICD-10-CM | POA: Insufficient documentation

## 2022-02-25 DIAGNOSIS — R21 Rash and other nonspecific skin eruption: Secondary | ICD-10-CM | POA: Diagnosis not present

## 2022-02-25 DIAGNOSIS — S8992XA Unspecified injury of left lower leg, initial encounter: Secondary | ICD-10-CM | POA: Diagnosis present

## 2022-02-25 DIAGNOSIS — Z23 Encounter for immunization: Secondary | ICD-10-CM | POA: Diagnosis not present

## 2022-02-25 DIAGNOSIS — W19XXXA Unspecified fall, initial encounter: Secondary | ICD-10-CM

## 2022-02-25 DIAGNOSIS — S0511XA Contusion of eyeball and orbital tissues, right eye, initial encounter: Secondary | ICD-10-CM | POA: Diagnosis not present

## 2022-02-25 LAB — TROPONIN I (HIGH SENSITIVITY): Troponin I (High Sensitivity): 4 ng/L (ref <18)

## 2022-02-25 LAB — CBC WITH DIFFERENTIAL/PLATELET
Abs Immature Granulocytes: 0.03 10*3/uL (ref 0.00–0.07)
Basophils Absolute: 0 10*3/uL (ref 0.0–0.1)
Basophils Relative: 0 %
Eosinophils Absolute: 0.1 10*3/uL (ref 0.0–0.5)
Eosinophils Relative: 1 %
HCT: 45.1 % (ref 36.0–46.0)
Hemoglobin: 14.5 g/dL (ref 12.0–15.0)
Immature Granulocytes: 0 %
Lymphocytes Relative: 11 %
Lymphs Abs: 0.8 10*3/uL (ref 0.7–4.0)
MCH: 29.2 pg (ref 26.0–34.0)
MCHC: 32.2 g/dL (ref 30.0–36.0)
MCV: 90.9 fL (ref 80.0–100.0)
Monocytes Absolute: 0.5 10*3/uL (ref 0.1–1.0)
Monocytes Relative: 6 %
Neutro Abs: 5.9 10*3/uL (ref 1.7–7.7)
Neutrophils Relative %: 82 %
Platelets: 243 10*3/uL (ref 150–400)
RBC: 4.96 MIL/uL (ref 3.87–5.11)
RDW: 14.6 % (ref 11.5–15.5)
WBC: 7.2 10*3/uL (ref 4.0–10.5)
nRBC: 0 % (ref 0.0–0.2)

## 2022-02-25 LAB — COMPREHENSIVE METABOLIC PANEL
ALT: 21 U/L (ref 0–44)
AST: 20 U/L (ref 15–41)
Albumin: 3.5 g/dL (ref 3.5–5.0)
Alkaline Phosphatase: 44 U/L (ref 38–126)
Anion gap: 7 (ref 5–15)
BUN: 35 mg/dL — ABNORMAL HIGH (ref 8–23)
CO2: 22 mmol/L (ref 22–32)
Calcium: 9.1 mg/dL (ref 8.9–10.3)
Chloride: 111 mmol/L (ref 98–111)
Creatinine, Ser: 0.68 mg/dL (ref 0.44–1.00)
GFR, Estimated: >60 mL/min (ref >60)
Glucose, Bld: 113 mg/dL — ABNORMAL HIGH (ref 70–99)
Potassium: 4.2 mmol/L (ref 3.5–5.1)
Sodium: 140 mmol/L (ref 135–145)
Total Bilirubin: 0.5 mg/dL (ref 0.3–1.2)
Total Protein: 5.7 g/dL — ABNORMAL LOW (ref 6.5–8.1)

## 2022-02-25 LAB — CBG MONITORING, ED: Glucose-Capillary: 84 mg/dL (ref 70–99)

## 2022-02-25 MED ORDER — TETANUS-DIPHTH-ACELL PERTUSSIS 5-2.5-18.5 LF-MCG/0.5 IM SUSY
0.5000 mL | PREFILLED_SYRINGE | Freq: Once | INTRAMUSCULAR | Status: AC
  Administered 2022-02-25: 0.5 mL via INTRAMUSCULAR
  Filled 2022-02-25: qty 0.5

## 2022-02-25 MED ORDER — ACETAMINOPHEN 325 MG PO TABS
650.0000 mg | ORAL_TABLET | Freq: Once | ORAL | Status: AC
  Administered 2022-02-25: 650 mg via ORAL
  Filled 2022-02-25: qty 2

## 2022-02-25 MED ORDER — TRAMADOL HCL 50 MG PO TABS
25.0000 mg | ORAL_TABLET | Freq: Once | ORAL | Status: AC
  Administered 2022-02-25: 25 mg via ORAL
  Filled 2022-02-25: qty 1

## 2022-02-25 MED ORDER — APIXABAN 2.5 MG PO TABS: 2.5000 mg | ORAL_TABLET | Freq: Two times a day (BID) | ORAL | 0 refills | Status: DC

## 2022-02-25 MED ORDER — LIDOCAINE-EPINEPHRINE 2 %-1:200000 IJ SOLN
20.0000 mL | Freq: Once | INTRAMUSCULAR | Status: AC
Start: 2022-02-25 — End: 2022-02-25
  Administered 2022-02-25: 20 mL
  Filled 2022-02-25: qty 20

## 2022-02-25 MED ORDER — MECLIZINE HCL 25 MG PO TABS
12.5000 mg | ORAL_TABLET | Freq: Once | ORAL | Status: AC
  Administered 2022-02-25: 12.5 mg via ORAL
  Filled 2022-02-25: qty 1

## 2022-02-25 NOTE — Discharge Instructions (Addendum)
You were seen in the emergency department today for a fall.  All of your images are normal.  You do have 2 quite large lacerations to your left leg.  We sutured these.  You will need to have your sutures removed in 7 to 10 days.  Please call your primary care as well as Dr. Nelda Severe to schedule follow-up appointments.  I have also attempted to call Dr. Christinia Gully office to let him know about your injury.  Please continue to cover the area with Xeroform gauze and a dressing.  Please return to the emergency department if there are any signs of infection to the wounds or worsening confusion.  Additionally, you have an irregular heartbeat called atrial fibrillation.  I am starting you on a medication called Eliquis which is a blood thinner.  I have also referred you to the atrial fibrillation clinic.  Please call your cardiologist to also have follow-up.  Please do not take NSAIDs such as Aleve, ibuprofen, diclofenac, Motrin with this medication.  This medication increases your risk of bleeding.  Please be careful when getting around.

## 2022-02-25 NOTE — ED Notes (Signed)
Patient transported to CT 

## 2022-02-25 NOTE — ED Notes (Signed)
Provider at bedside

## 2022-02-25 NOTE — ED Provider Notes (Signed)
[facility] EMERGENCY DEPARTMENT Provider Note   CSN: [id number] Arrival date & time: 02/25/22  [id number]     History  Chief Complaint  Patient presents with   Fall   Fatigue    Tina Webb is a 88 y.o. female.  With past medical history of multiple sclerosis confined to motorized wheelchair, hypercholesterolemia who presents to the emergency department for fall.  Husband accompanies patient and provides the majority of the history.  He states that she was in her motorized wheelchair this morning going through the house when she hit the door frame falling out of the wheelchair and striking her head on a glass table.  He states that afterward she was acting abnormally and confused.  He states that she also hit her leg on an unknown object but has significant skin tear.  He states that she is always in a motorized wheelchair.  States that she is not anticoagulated.  They live at [facility], but in an independent living apartment together.  Patient states that she did strike her head.  States that she does not remember exactly what happened but states that she may have felt dizzy prior to the incident.  States that this dizziness has been intermittent over the past 2 weeks.  She is alert and oriented at this time.  She complains of pain to the left lower leg.  She is denying any head or neck pain.  Denies any abdominal pain or hip pain.  HPI     Home Medications Prior to Admission medications   Medication Sig Start Date End Date Taking? Authorizing Provider  AMPYRA 10 MG TB12 Take 1 tablet (10 mg total) by mouth in the morning and at bedtime. BRAND NAME AMPYRA MEDICALLY NECESSARY 08/16/21   Sater, Nanine Means, MD  baclofen (LIORESAL) 10 MG tablet Take 0.5 tablets (5 mg total) by mouth 2 (two) times daily. 08/23/21   Sater, Nanine Means, MD  clobetasol cream (TEMOVATE) [id number] % Apply 1 application topically as needed.    [provider]  Cranberry-Vitamin C-Probiotic (AZO  CRANBERRY PO) Take 1 tablet by mouth daily.    [provider]  DICLOFENAC SODIUM PO Take 100 mg by mouth as needed.    [provider]  Esomeprazole Magnesium (NEXIUM PO) Take 1 Dose by mouth as needed.    [provider]  estradiol (ESTRING) 2 MG vaginal ring Place 2 mg vaginally every 3 (three) months. follow package directions    [provider]  gabapentin (NEURONTIN) 300 MG capsule Take 300 mg by mouth 3 (three) times daily.    [provider]  hydrocortisone 2.5 % cream Apply 1 application topically as needed.    [provider]  levothyroxine (SYNTHROID) 25 MCG tablet Take 25 mcg by mouth daily before breakfast.    [provider]  liothyronine (CYTOMEL) 5 MCG tablet Take 5 mcg by mouth in the morning and at bedtime.    [provider]  methenamine (HIPREX) 1 g tablet Take 1 g by mouth daily.    [provider]  metoprolol tartrate (LOPRESSOR) 25 MG tablet Take 1 tablet (25 mg total) by mouth 2 (two) times daily. 10/01/21   Sater, Nanine Means, MD  mupirocin cream (BACTROBAN) 2 % Apply 1 application topically as needed.    [provider]  nitrofurantoin (MACRODANTIN) 50 MG capsule Take 50 mg by mouth daily.    [provider]  polyethylene glycol (MIRALAX / GLYCOLAX) 17 g packet Take  17 g by mouth daily.    [provider]  Probiotic Product (PROBIOTIC PO) Take 2 tablets by mouth daily.    [provider]  rosuvastatin (CRESTOR) 20 MG tablet Take 20 mg by mouth daily.    [provider]  tolterodine (DETROL LA) 4 MG 24 hr capsule Take 1 capsule (4 mg total) by mouth daily. 01/31/22   Sater, Nanine Means, MD  triamcinolone cream (KENALOG) 0.1 % Apply 1 application topically as needed.    [provider]  VITAMIN D PO Take 4,000 Units by mouth daily.    [provider]      Allergies    Patient has no known allergies.    Review of Systems   Review of  Systems  Musculoskeletal:  Positive for gait problem and myalgias.  Skin:  Positive for wound.  Neurological:  Positive for dizziness.  All other systems reviewed and are negative.   Physical Exam Updated Vital Signs There were no vitals taken for this visit. Physical Exam Vitals and nursing note reviewed.  Constitutional:      General: She is not in acute distress.    Appearance: Normal appearance. She is ill-appearing.  HENT:     Head: Normocephalic. Contusion present.      Comments: Contusion over the right eye and nasal bridge    Mouth/Throat:     Mouth: Mucous membranes are moist.     Pharynx: Oropharynx is clear.  Eyes:     General: No scleral icterus.    Extraocular Movements: Extraocular movements intact.     Pupils: Pupils are equal, round, and reactive to light.  Neck:     Comments: In c-collar on exam Cardiovascular:     Rate and Rhythm: Normal rate and regular rhythm.     Pulses: Normal pulses.     Heart sounds: Murmur heard.  Pulmonary:     Effort: Pulmonary effort is normal. No respiratory distress.     Breath sounds: Normal breath sounds.  Abdominal:     General: Bowel sounds are normal.     Palpations: Abdomen is soft.     Tenderness: There is no abdominal tenderness.     Comments: Rash to the left lower quadrant of the abdomen, not new  Musculoskeletal:        General: Tenderness and signs of injury present.     Cervical back: No tenderness.     Right lower leg: Edema present.     Left lower leg: Edema present.     Comments: Logrolled with C-spine precautions.  She has no tenderness to palpation of the T or L-spine.  No injuries noted to her back.  No hip deformities, leg shortening or rotation or tenderness to palpation of the hips.  Skin:    General: Skin is warm and dry.     Capillary Refill: Capillary refill takes less than 2 seconds.     Findings: Bruising present.          Comments: Large skin tear noted to the lateral left lower leg just  proximal to the ankle Bilateral lower extremity swelling, chronic  Neurological:     General: No focal deficit present.     Mental Status: She is alert and oriented to person, place, and time.     Motor: Weakness present.  Psychiatric:        Mood and Affect: Mood normal.        Behavior: Behavior normal.  Thought Content: Thought content normal.        Judgment: Judgment normal.     ED Results / Procedures / Treatments   Labs (all labs ordered are listed, but only abnormal results are displayed) Labs Reviewed  COMPREHENSIVE METABOLIC PANEL  CBC WITH DIFFERENTIAL/PLATELET  URINALYSIS, ROUTINE W REFLEX MICROSCOPIC  CBG MONITORING, ED  TROPONIN I (HIGH SENSITIVITY)   EKG None  Radiology CT Head Wo Contrast  Result Date: 02/25/2022 CLINICAL DATA:  Head trauma, moderate/severe. Additional history provided: Fall, fatigue. EXAM: CT HEAD WITHOUT CONTRAST CT MAXILLOFACIAL WITHOUT CONTRAST CT CERVICAL SPINE WITHOUT CONTRAST TECHNIQUE: Multidetector CT imaging of the head, cervical spine, and maxillofacial structures were performed using the standard protocol without intravenous contrast. Multiplanar CT image reconstructions of the cervical spine and maxillofacial structures were also generated. RADIATION DOSE REDUCTION: This exam was performed according to the departmental dose-optimization program which includes automated exposure control, adjustment of the mA and/or kV according to patient size and/or use of iterative reconstruction technique. COMPARISON:  Cervical spine CT 10/16/2021. FINDINGS: CT HEAD FINDINGS Brain: Mild for age generalized cerebral atrophy. Mild patchy and ill-defined hypoattenuation within the cerebral white matter, nonspecific but compatible with chronic small vessel ischemic disease. There is no acute intracranial hemorrhage. No demarcated cortical infarct. No extra-axial fluid collection. No evidence of an intracranial mass. No midline shift. Vascular: No  hyperdense vessel. Atherosclerotic calcifications. Skull: No fracture or aggressive osseous lesion. CT MAXILLOFACIAL FINDINGS Osseous: No acute maxillofacial fracture is identified. Orbits: No acute abnormality within the orbits. Sinuses: Trace mucosal thickening scattered within the bilateral ethmoid air cells. Soft tissues: No maxillofacial soft tissue swelling or hematoma appreciable by CT. CT CERVICAL SPINE FINDINGS Alignment: Levocurvature of the cervical spine. Slight C4-C5 grade 1 anterolisthesis. 2 mm C7-T1 grade 1 anterolisthesis. Skull base and vertebrae: The basion-dental and atlanto-dental intervals are maintained.No evidence of acute fracture to the cervical spine. Facet joint ankylosis on the left at C4-C5. Soft tissues and spinal canal: No prevertebral fluid or swelling. No visible canal hematoma. Disc levels: Cervical spondylosis with multilevel disc space narrowing, disc bulges, posterior disc osteophyte complexes, uncovertebral hypertrophy and facet arthrosis. No appreciable high-grade spinal canal stenosis. Multilevel bony neural foraminal narrowing. Upper chest: Partially imaged left pleural effusion. No airspace consolidation within the imaged lung apices. No visible pneumothorax. IMPRESSION: CT head: 1. No evidence of acute intracranial abnormality. 2. Mild chronic small vessel ischemic changes within the cerebral white matter. 3. Mild generalized cerebral atrophy. CT maxillofacial: No evidence of acute maxillofacial fracture. CT cervical spine: 1. No evidence of acute fracture to the cervical spine. 2. Levocurvature of the cervical spine. 3. Mild grade 1 spondylolisthesis at C4-C5 and C7-T1, chronic and unchanged. 4. Cervical spondylosis, as described. 5. Left C4-C5 facet joint ankylosis. 6. Partially imaged left pleural effusion. Electronically Signed   By: Kellie Simmering D.O.   On: 02/25/2022 10:28   CT Cervical Spine Wo Contrast  Result Date: 02/25/2022 CLINICAL DATA:  Head trauma,  moderate/severe. Additional history provided: Fall, fatigue. EXAM: CT HEAD WITHOUT CONTRAST CT MAXILLOFACIAL WITHOUT CONTRAST CT CERVICAL SPINE WITHOUT CONTRAST TECHNIQUE: Multidetector CT imaging of the head, cervical spine, and maxillofacial structures were performed using the standard protocol without intravenous contrast. Multiplanar CT image reconstructions of the cervical spine and maxillofacial structures were also generated. RADIATION DOSE REDUCTION: This exam was performed according to the departmental dose-optimization program which includes automated exposure control, adjustment of the mA and/or kV according to patient size and/or use of iterative reconstruction technique. COMPARISON:  Cervical spine CT 10/16/2021. FINDINGS: CT HEAD FINDINGS Brain: Mild for age generalized cerebral atrophy. Mild patchy and ill-defined hypoattenuation within the cerebral white matter, nonspecific but compatible with chronic small vessel ischemic disease. There is no acute intracranial hemorrhage. No demarcated cortical infarct. No extra-axial fluid collection. No evidence of an intracranial mass. No midline shift. Vascular: No hyperdense vessel. Atherosclerotic calcifications. Skull: No fracture or aggressive osseous lesion. CT MAXILLOFACIAL FINDINGS Osseous: No acute maxillofacial fracture is identified. Orbits: No acute abnormality within the orbits. Sinuses: Trace mucosal thickening scattered within the bilateral ethmoid air cells. Soft tissues: No maxillofacial soft tissue swelling or hematoma appreciable by CT. CT CERVICAL SPINE FINDINGS Alignment: Levocurvature of the cervical spine. Slight C4-C5 grade 1 anterolisthesis. 2 mm C7-T1 grade 1 anterolisthesis. Skull base and vertebrae: The basion-dental and atlanto-dental intervals are maintained.No evidence of acute fracture to the cervical spine. Facet joint ankylosis on the left at C4-C5. Soft tissues and spinal canal: No prevertebral fluid or swelling. No visible  canal hematoma. Disc levels: Cervical spondylosis with multilevel disc space narrowing, disc bulges, posterior disc osteophyte complexes, uncovertebral hypertrophy and facet arthrosis. No appreciable high-grade spinal canal stenosis. Multilevel bony neural foraminal narrowing. Upper chest: Partially imaged left pleural effusion. No airspace consolidation within the imaged lung apices. No visible pneumothorax. IMPRESSION: CT head: 1. No evidence of acute intracranial abnormality. 2. Mild chronic small vessel ischemic changes within the cerebral white matter. 3. Mild generalized cerebral atrophy. CT maxillofacial: No evidence of acute maxillofacial fracture. CT cervical spine: 1. No evidence of acute fracture to the cervical spine. 2. Levocurvature of the cervical spine. 3. Mild grade 1 spondylolisthesis at C4-C5 and C7-T1, chronic and unchanged. 4. Cervical spondylosis, as described. 5. Left C4-C5 facet joint ankylosis. 6. Partially imaged left pleural effusion. Electronically Signed   By: Kellie Simmering D.O.   On: 02/25/2022 10:28   CT Maxillofacial WO CM  Result Date: 02/25/2022 CLINICAL DATA:  Head trauma, moderate/severe. Additional history provided: Fall, fatigue. EXAM: CT HEAD WITHOUT CONTRAST CT MAXILLOFACIAL WITHOUT CONTRAST CT CERVICAL SPINE WITHOUT CONTRAST TECHNIQUE: Multidetector CT imaging of the head, cervical spine, and maxillofacial structures were performed using the standard protocol without intravenous contrast. Multiplanar CT image reconstructions of the cervical spine and maxillofacial structures were also generated. RADIATION DOSE REDUCTION: This exam was performed according to the departmental dose-optimization program which includes automated exposure control, adjustment of the mA and/or kV according to patient size and/or use of iterative reconstruction technique. COMPARISON:  Cervical spine CT 10/16/2021. FINDINGS: CT HEAD FINDINGS Brain: Mild for age generalized cerebral atrophy. Mild  patchy and ill-defined hypoattenuation within the cerebral white matter, nonspecific but compatible with chronic small vessel ischemic disease. There is no acute intracranial hemorrhage. No demarcated cortical infarct. No extra-axial fluid collection. No evidence of an intracranial mass. No midline shift. Vascular: No hyperdense vessel. Atherosclerotic calcifications. Skull: No fracture or aggressive osseous lesion. CT MAXILLOFACIAL FINDINGS Osseous: No acute maxillofacial fracture is identified. Orbits: No acute abnormality within the orbits. Sinuses: Trace mucosal thickening scattered within the bilateral ethmoid air cells. Soft tissues: No maxillofacial soft tissue swelling or hematoma appreciable by CT. CT CERVICAL SPINE FINDINGS Alignment: Levocurvature of the cervical spine. Slight C4-C5 grade 1 anterolisthesis. 2 mm C7-T1 grade 1 anterolisthesis. Skull base and vertebrae: The basion-dental and atlanto-dental intervals are maintained.No evidence of acute fracture to the cervical spine. Facet joint ankylosis on the left at C4-C5. Soft tissues and spinal canal: No prevertebral fluid or swelling. No visible canal hematoma. Disc levels:  Cervical spondylosis with multilevel disc space narrowing, disc bulges, posterior disc osteophyte complexes, uncovertebral hypertrophy and facet arthrosis. No appreciable high-grade spinal canal stenosis. Multilevel bony neural foraminal narrowing. Upper chest: Partially imaged left pleural effusion. No airspace consolidation within the imaged lung apices. No visible pneumothorax. IMPRESSION: CT head: 1. No evidence of acute intracranial abnormality. 2. Mild chronic small vessel ischemic changes within the cerebral white matter. 3. Mild generalized cerebral atrophy. CT maxillofacial: No evidence of acute maxillofacial fracture. CT cervical spine: 1. No evidence of acute fracture to the cervical spine. 2. Levocurvature of the cervical spine. 3. Mild grade 1 spondylolisthesis at  C4-C5 and C7-T1, chronic and unchanged. 4. Cervical spondylosis, as described. 5. Left C4-C5 facet joint ankylosis. 6. Partially imaged left pleural effusion. Electronically Signed   By: Jackey Loge D.O.   On: 02/25/2022 10:28   DG Tibia/Fibula Left  Result Date: 02/25/2022 CLINICAL DATA:  Large skin tears at medial and lateral aspects of mid to distal LEFT lower leg post fall EXAM: LEFT TIBIA AND FIBULA - 2 VIEW COMPARISON:  None FINDINGS: Large soft tissue injuries are seen medially and laterally at the mid to distal lower leg. Osseous demineralization. Knee and ankle joint alignments normal. No acute fracture, dislocation, or bone destruction. IMPRESSION: Soft tissue injuries to distal LEFT lower leg. Osseous demineralization without acute bony abnormalities. Electronically Signed   By: Ulyses Southward M.D.   On: 02/25/2022 09:55    Procedures .Marland KitchenLaceration Repair  Date/Time: 02/25/2022 2:02 PM  Performed by: Cristopher Peru, PA-C Authorized by: Cristopher Peru, PA-C   Consent:    Consent obtained:  Verbal   Consent given by:  Patient   Risks, benefits, and alternatives were discussed: yes     Risks discussed:  Infection, pain, need for additional repair, poor cosmetic result and poor wound healing   Alternatives discussed:  No treatment Universal protocol:    Procedure explained and questions answered to patient or proxy's satisfaction: yes     Relevant documents present and verified: yes     Test results available: yes     Imaging studies available: yes     Required blood products, implants, devices, and special equipment available: yes     Site/side marked: yes     Immediately prior to procedure, a time out was called: yes     Patient identity confirmed:  Verbally with patient Anesthesia:    Anesthesia method:  Local infiltration   Local anesthetic:  Lidocaine 2% WITH epi Laceration details:    Location:  Leg   Leg location:  L lower leg   Length (cm):  5   Depth (mm):   10 Pre-procedure details:    Preparation:  Patient was prepped and draped in usual sterile fashion and imaging obtained to evaluate for foreign bodies Exploration:    Limited defect created (wound extended): no     Hemostasis achieved with:  Epinephrine   Imaging obtained: x-ray     Imaging outcome: foreign body not noted     Wound exploration: wound explored through full range of motion and entire depth of wound visualized     Wound extent: areolar tissue violated and fascia violated     Contaminated: no   Treatment:    Area cleansed with:  Saline   Amount of cleaning:  Standard   Irrigation solution:  Sterile saline   Irrigation volume:  100   Irrigation method:  Syringe   Visualized foreign bodies/material removed: yes  Debridement:  None   Undermining:  Minimal   Layers/structures repaired:  Deep dermal/superficial fascia Deep dermal/superficial fascia:    Suture size:  4-0   Suture material:  Vicryl   Suture technique:  Simple interrupted   Number of sutures:  3 Skin repair:    Repair method:  Sutures   Suture size:  4-0   Suture material:  Prolene   Suture technique:  Simple interrupted   Number of sutures:  2 Approximation:    Approximation:  Loose Repair type:    Repair type:  Complex Post-procedure details:    Dressing:  Antibiotic ointment and non-adherent dressing   Procedure completion:  Tolerated well, no immediate complications .Marland KitchenLaceration Repair  Date/Time: 02/25/2022 2:04 PM  Performed by: Mickie Hillier, PA-C Authorized by: Mickie Hillier, PA-C   Consent:    Consent obtained:  Verbal   Consent given by:  Patient   Risks, benefits, and alternatives were discussed: yes     Risks discussed:  Infection, pain, poor cosmetic result, poor wound healing and need for additional repair   Alternatives discussed:  No treatment Universal protocol:    Procedure explained and questions answered to patient or proxy's satisfaction: yes     Relevant documents  present and verified: yes     Test results available: yes     Imaging studies available: yes     Required blood products, implants, devices, and special equipment available: yes     Site/side marked: yes     Immediately prior to procedure, a time out was called: yes     Patient identity confirmed:  Verbally with patient Anesthesia:    Anesthesia method:  Local infiltration   Local anesthetic:  Lidocaine 2% WITH epi Laceration details:    Location:  Leg   Leg location:  L lower leg   Length (cm):  10   Depth (mm):  2 Pre-procedure details:    Preparation:  Patient was prepped and draped in usual sterile fashion and imaging obtained to evaluate for foreign bodies Exploration:    Limited defect created (wound extended): no     Hemostasis achieved with:  Epinephrine   Imaging obtained: x-ray     Imaging outcome: foreign body not noted     Wound exploration: wound explored through full range of motion and entire depth of wound visualized     Wound extent: areolar tissue violated     Contaminated: no   Treatment:    Area cleansed with:  Saline   Amount of cleaning:  Standard   Irrigation solution:  Sterile saline   Irrigation method:  Syringe   Visualized foreign bodies/material removed: yes     Debridement:  Minimal   Undermining:  Extensive   Scar revision: no   Skin repair:    Repair method:  Sutures   Suture size:  4-0   Suture material:  Prolene   Suture technique:  Simple interrupted   Number of sutures:  7 Approximation:    Approximation:  Close Repair type:    Repair type:  Intermediate Post-procedure details:    Dressing:  Antibiotic ointment and non-adherent dressing   Procedure completion:  Tolerated well, no immediate complications    Medications Ordered in ED Medications  acetaminophen (TYLENOL) tablet 650 mg (has no administration in time range)    ED Course/ Medical Decision Making/ A&P Clinical Course as of 02/25/22 1402  Mon Feb 25, 2022  1042  C-collar cleared  [LA]  1114 Spoke with  Dr. Rennis Golden, cardiology, who agrees that EKG appears to be new? Afib. Recommends she be anticoagulated [LA]    Clinical Course User Index [LA] Cristopher Peru, PA-C                           Medical Decision Making Amount and/or Complexity of Data Reviewed Labs: ordered. Radiology: ordered.  Risk OTC drugs. Prescription drug management.  This patient presents to the ED with chief complaint(s) of *** with pertinent past medical history of *** which further complicates the presenting complaint. The complaint involves an extensive differential diagnosis and also carries with it a high risk of complications and morbidity.    The differential diagnosis includes ***   Additional history obtained: Additional history obtained from {additional history:26846} Records reviewed {records:26847}  ED Course and Reassessment:   Independent labs interpretation:  The following labs were independently interpreted: ***  Independent visualization of imaging: - I independently visualized the following imaging with scope of interpretation limited to determining acute life threatening conditions related to emergency care: ***, which revealed ***  Consultation: - Consulted or discussed management/test interpretation w/ external professional: ***  Consideration for admission or further workup: Social Determinants of health:  Final Clinical Impression(s) / ED Diagnoses Final diagnoses:  None    Rx / DC Orders ED Discharge Orders     None

## 2022-02-25 NOTE — ED Triage Notes (Signed)
Pt came via [facility] from [place]. Pt fell out of wheelchair last night. Pt has a laceration on her left shin,and bruising near her right eye. Pt's husband reports that she is acting more lethargic than normal. Pt is Aox4 and wheelchair bound.   Last VS   Last  92 Pulse  18RR  94 RA  124 CBG  98.2 T

## 2022-03-05 ENCOUNTER — Ambulatory Visit (INDEPENDENT_AMBULATORY_CARE_PROVIDER_SITE_OTHER): Payer: Medicare Other | Admitting: Neurology

## 2022-03-05 ENCOUNTER — Encounter: Payer: Self-pay | Admitting: Neurology

## 2022-03-05 VITALS — BP 126/64 | HR 93 | Ht 62.0 in

## 2022-03-05 DIAGNOSIS — N3941 Urge incontinence: Secondary | ICD-10-CM

## 2022-03-05 DIAGNOSIS — G250 Essential tremor: Secondary | ICD-10-CM | POA: Diagnosis not present

## 2022-03-05 DIAGNOSIS — G801 Spastic diplegic cerebral palsy: Secondary | ICD-10-CM | POA: Diagnosis not present

## 2022-03-05 DIAGNOSIS — R269 Unspecified abnormalities of gait and mobility: Secondary | ICD-10-CM | POA: Diagnosis not present

## 2022-03-05 DIAGNOSIS — G35 Multiple sclerosis: Secondary | ICD-10-CM

## 2022-03-05 DIAGNOSIS — R404 Transient alteration of awareness: Secondary | ICD-10-CM

## 2022-03-05 NOTE — Progress Notes (Signed)
[facility]  PATIENT: Tina Webb DOB: 03-Apr-1934  REFERRING DOCTOR OR PCP:  Wilhelmina Mcardle (PCP); Huel Coventry (neurology) SOURCE: Patient, notes from Dr. Excell Seltzer, imaging reports, MRI images personally reviewed.  _________________________________   HISTORICAL  CHIEF COMPLAINT:  Chief Complaint  Patient presents with   Follow-up    RM 1, alone. Last seen 08/23/21.In electric WC. Feels she is getting weaker. Has personal trainer that comes three times weekly. Larey Seat out of WC at home and went to ER 02/25/22. Going to wound management once a week for left leg. She is also retaining urine since going on tolterodine. When she does urinate, she only dribbles out urine. She tried to get brand vesicare approved via insurance but they would only approve generic.    HISTORY OF PRESENT ILLNESS:  Tina Webb is a 88 y.o. woman with multiple sclerosis diagnosed in 1984.  Update 03/05/2022 She feels she is losing ground despite exercising (has personal trainer x 3 times a week).    Specifically, she feels weaker and with less endurance  She fell out of her wheelchair 8/212023 - she fainted and then slipped off landing on face. She went to the ED and was found to have AFib.   She was started on Eliquis and set up to see cardiology.     Husband saw her faint and did not note any seizure activity.   No incontinence.      She has no recollection of the ambulance but remembers the ED well.    She was confused a while afterwards.     Vesicare brand was helping but the generic is not helping much.   Detrol seemed to cause more issues with hesitancy.    She goes to urology.     She is on metoprolol for her tremor and it helpd.  It likely helped her not  have a rapid AFib (HR was 95).     She is feeling weaker than she felt at her last visit.   She uses an Scientist, clinical (histocompatibility and immunogenetics).   She is often able to transfer independently from her chair but sometimes needs help.  Arm strength  is stable but she has a torn rotator cuff not operable).   She gets q79month shots into the shoulder tha helps the pain some.    Ampyra had helped strength.   She never tried amantadine.     Metoprolol was started and has helped the tremor and fine control some.    She also notes worse hand coordination.   She has a tremor in her right hand that has worsened this past year.   Writing and makeup is more difficult.     She cannot use a walker.   She has right > left weakness and spasticity in legs.    She has some numbness that is not bothersome or painful.   She has a lot of urinary urgency and takes solifenacin which is a little better than oxybutynn.   She still dribbles some.  She has constipation despite fiber and Miralax.  Sh gets edema and she uses a leg lift function on her wheelchair with some benefit.   She has a trainer come to help exercise a few times a week.     She has mild fatigue.   Mood is fine.    Cognition is doing well  She had Covid and was much more tired during Covid and still feels a little weaker  MS History: She was diagnosed  with MS in 1984 after presenting with numbness in both legs.  She had her husband have recently moved to the [place] area from [place].  While in [place], she was seen Dr. Huel Coventry for her multiple sclerosis.    She reports that the numbness that presented in 1984 was present in both legs.  She improved over time and did not receive any steroids at that time..  Initially, she was not on any DMT but started Copaxone in the 1990's.   She had progression and over the years was also on Rebif, Tecfidera, Aubagio, Zinbryta and Gilenya.  On over the medications, she continued to have slow worsening of her disability.   She did have some definite motor and visual exacerbations in the 1980's and 1990's.  She had a severe UTI in 2018 and she stopped all DMTs at that time.    She has a bladder stimulator (not functional) and is unable to get an MRI.    Her last MRI was around 2013.  She has worked with a Systems analyst and done physical therapy.  This has helped her gain better ability to transfer.   MRI of the brain and cervical spine from 06/10/2012 was personally reviewed.  The MRI of the cervical spine showed T2 hyperintense foci adjacent to C1-C2, C4 and C7 posteriorly and adjacent to C5-C6 to the left.  Additionally there are degenerative changes at C5-C6 causing mild spinal stenosis and right greater than left foraminal narrowing.  The MRI of the brain showed multiple T2/FLAIR hyperintense foci in the periventricular, juxtacortical and deep white matter of both hemispheres.  Additionally there is a T2 hyperintense focus anteriorly within the pontomedullary junction towards the right.  REVIEW OF SYSTEMS: Constitutional: No fevers, chills, sweats, or change in appetite.  She has fatigue.  Eyes: No visual changes, double vision, eye pain Ear, nose and throat: No hearing loss, ear pain, nasal congestion, sore throat Cardiovascular: No chest pain, palpitations Respiratory:  No shortness of breath at rest or with exertion.   No wheezes GastrointestinaI: No nausea, vomiting, diarrhea, abdominal pain, fecal incontinence Genitourinary: Urinary frequency, urgency and multiple UTIs Musculoskeletal: She often has left shoulder pain.   Integumentary: No rash, pruritus, skin lesions Neurological: as above Psychiatric: No depression at this time.  No anxiety Endocrine: No palpitations, diaphoresis, change in appetite, change in weigh or increased thirst Hematologic/Lymphatic:  No anemia, purpura, petechiae. Allergic/Immunologic: No itchy/runny eyes, nasal congestion, recent allergic reactions, rashes  ALLERGIES: No Known Allergies  HOME MEDICATIONS:  Current Outpatient Medications:    AMPYRA 10 MG TB12, Take 1 tablet (10 mg total) by mouth in the morning and at bedtime. BRAND NAME AMPYRA MEDICALLY NECESSARY, Disp: 60 tablet, Rfl: 11   apixaban  (ELIQUIS) 2.5 MG TABS tablet, Take 1 tablet (2.5 mg total) by mouth 2 (two) times daily., Disp: 60 tablet, Rfl: 0   baclofen (LIORESAL) 10 MG tablet, Take 0.5 tablets (5 mg total) by mouth 2 (two) times daily., Disp: 90 each, Rfl: 4   clobetasol cream (TEMOVATE) 0.05 %, Apply 1 application topically as needed., Disp: , Rfl:    Cranberry-Vitamin C-Probiotic (AZO CRANBERRY PO), Take 1 tablet by mouth daily., Disp: , Rfl:    Esomeprazole Magnesium (NEXIUM PO), Take 1 Dose by mouth as needed., Disp: , Rfl:    estradiol (ESTRING) 2 MG vaginal ring, Place 2 mg vaginally every 3 (three) months. follow package directions, Disp: , Rfl:    gabapentin (NEURONTIN) 300 MG capsule, Take 300  mg by mouth 3 (three) times daily., Disp: , Rfl:    hydrocortisone 2.5 % cream, Apply 1 application topically as needed., Disp: , Rfl:    levothyroxine (SYNTHROID) 25 MCG tablet, Take 25 mcg by mouth daily before breakfast., Disp: , Rfl:    liothyronine (CYTOMEL) 5 MCG tablet, Take 5 mcg by mouth in the morning and at bedtime., Disp: , Rfl:    methenamine (HIPREX) 1 g tablet, Take 1 g by mouth daily., Disp: , Rfl:    metoprolol tartrate (LOPRESSOR) 25 MG tablet, Take 1 tablet (25 mg total) by mouth 2 (two) times daily., Disp: 90 tablet, Rfl: 3   mupirocin cream (BACTROBAN) 2 %, Apply 1 application topically as needed., Disp: , Rfl:    polyethylene glycol (MIRALAX / GLYCOLAX) 17 g packet, Take 17 g by mouth daily., Disp: , Rfl:    Probiotic Product (PROBIOTIC PO), Take 2 tablets by mouth daily., Disp: , Rfl:    rosuvastatin (CRESTOR) 20 MG tablet, Take 20 mg by mouth daily., Disp: , Rfl:    solifenacin (VESICARE) 5 MG tablet, Take 1 tablet by mouth daily., Disp: , Rfl:    triamcinolone cream (KENALOG) 0.1 %, Apply 1 application topically as needed., Disp: , Rfl:    VITAMIN D PO, Take 4,000 Units by mouth daily., Disp: , Rfl:   PAST MEDICAL HISTORY: Past Medical History:  Diagnosis Date   Anxiety    High cholesterol     Multiple sclerosis (HCC)    Rotator cuff tear    left    PAST SURGICAL HISTORY: Past Surgical History:  Procedure Laterality Date   BLADDER SURGERY  2018   STEM placement    ULNAR NERVE REPAIR Right 2010    FAMILY HISTORY: Family History  Problem Relation Age of Onset   Heart attack Mother    Transient ischemic attack Mother    Arthritis Mother    Heart Problems Father     SOCIAL HISTORY:  Social History   Socioeconomic History   Marital status: Married    Spouse name: [name]   Number of children: 3   Years of education: College   Highest education level: Not on file  Occupational History   Occupation: Retired  Tobacco Use   Smoking status: Never   Smokeless tobacco: Never  Substance and Sexual Activity   Alcohol use: Yes    Comment: 2 drinks per week   Drug use: Never   Sexual activity: Not on file  Other Topics Concern   Not on file  Social History Narrative   Originally right handed but had surgery and now uses left hand for eating and other activities   Caffeine use: 2 cups coffee per day   Lives with husband   Lives at [place] with husband in [place]   Social Determinants of Health   Financial Resource Strain: Not on file  Food Insecurity: Not on file  Transportation Needs: Not on file  Physical Activity: Not on file  Stress: Not on file  Social Connections: Not on file  Intimate Partner Violence: Not on file     PHYSICAL EXAM  Vitals:   03/05/22 0945  BP: 126/64  Pulse: 93  Height: 5\' 2"  (1.575 m)    Body mass index is 20.49 kg/m.   General: The patient is well-developed and well-nourished and in no acute distress.   HR is irreg irreg.    HEENT:  Head is [place]/AT.  Sclera are anicteric.   .  Skin: Extremities  are without rash but she has bilateral pedal edema    Neurologic Exam  Mental status: The patient is alert and oriented x 3 at the time of the examination. The patient has apparent normal recent and remote memory,  with an apparently normal attention span and concentration ability.   Speech is normal.  Cranial nerves: Extraocular movements are full.  Color vision was symmetric.  Facial strength and sensation is normal.. No obvious hearing deficits are noted.  Motor: Her intention tremor in the hands has improved.  Muscle bulk is reduced in the ulnar innervated hand muscles bilaterally, right worse than left.   Tone is increased in the right leg.  . Strength is 3/5 in the ulnar innervated hand muscles on the right.  Proximal muscles on the right 4/5.  In the left arm, strength was 4 to 4+ /5.  In the legs, strength was 2+/5 in the lefthip flexors and 4 -/5 in the quadriceps and 3/5 elsewhere in the leg.  Otherwise, strength was 2/5 at the hip flexors, 2+/5 in the quadricep muscles and 2 in the lower leg.  Sensory: Sensory testing was intact to touch and vibration sensation in the legs..  Coordination: Cerebellar testing reveals good finger-nose-finger and she can't do heel-to-shin bilaterally.  Gait and station: She cannot stand or walk  Reflexes: Deep tendon reflexes are increased in legs, right > left.          ASSESSMENT AND PLAN  Multiple sclerosis (HCC)  Urge incontinence of urine  Essential tremor  Spastic diplegia (HCC)  Gait disorder  1.   She has inactive secondary progressive multiple sclerosis and has had slow progression.  This is unlikely to respond much to medication.  Therefore, we will have her continue off of a disease modifying therapy  2.   She felt some benefit from Ampyra and will continue..   3.   Continue metoprolol for tremor. This may also help to rate control of the A-fib.  Continue baclofen and other medications.   4.   The episode of transient alteration of awareness last week was most likely related to her A-fib.  However, if she has an additional Event we will check an EEG to determine if there is any epileptiform activity.   5. Return in 1 year or sooner if there  are new or worsening neurologic symptoms.   40-minute office visit with the majority of the time spent face-to-face for history and physical, discussion/counseling and decision-making.  Additional time with record review and documentation.   Maripaz Mullan A. Epimenio Foot, MD, [facility] 03/05/2022, 10:43 AM Certified in Neurology, Clinical Neurophysiology, Sleep Medicine and Neuroimaging  [facility] [address] [place], [place] [address] [phone]

## 2022-03-07 ENCOUNTER — Ambulatory Visit (INDEPENDENT_AMBULATORY_CARE_PROVIDER_SITE_OTHER): Payer: Medicare Other | Admitting: Orthopedic Surgery

## 2022-03-07 ENCOUNTER — Encounter: Payer: Self-pay | Admitting: Orthopedic Surgery

## 2022-03-07 DIAGNOSIS — M19012 Primary osteoarthritis, left shoulder: Secondary | ICD-10-CM | POA: Diagnosis not present

## 2022-03-07 MED ORDER — LIDOCAINE HCL 1 % IJ SOLN
5.0000 mL | INTRAMUSCULAR | Status: AC | PRN
  Administered 2022-03-07: 5 mL

## 2022-03-07 MED ORDER — METHYLPREDNISOLONE ACETATE 40 MG/ML IJ SUSP
40.0000 mg | INTRAMUSCULAR | Status: AC | PRN
  Administered 2022-03-07: 40 mg via INTRA_ARTICULAR

## 2022-03-07 MED ORDER — BUPIVACAINE HCL 0.5 % IJ SOLN
9.0000 mL | INTRAMUSCULAR | Status: AC | PRN
  Administered 2022-03-07: 9 mL via INTRA_ARTICULAR

## 2022-03-07 NOTE — Progress Notes (Signed)
Office Visit Note   Patient: Tina Webb           Date of Birth: [birth year]           MRN: [id number] Visit Date: 03/07/2022 Requested by: Nadara Eaton, MD [address] [place],  [place] [address] PCP: Nadara Eaton, MD  Subjective: Chief Complaint  Patient presents with   Shoulder Pain    HPI: Tina Webb is an 88 year old patient with known left shoulder arthritis which is not amenable to any operative fixation.  She gets periodic injections.  Last injection and aspiration was in March.  She did well with that for several weeks.  Since she was last seen she has developed atrial fibrillation and currently is on Eliquis.  No other orthopedic issues.  Notably she has improved her left arm symptoms by putting a cushion on the left wheelchair side.  That has alleviated her ulnar nerve symptoms.  Still has mild to moderate shoulder pain.              ROS: All systems reviewed are negative as they relate to the chief complaint within the history of present illness.  Patient denies  fevers or chills.   Assessment & Plan: Visit Diagnoses:  1. Arthritis of left shoulder region     Plan: Impression is left shoulder arthritis.  Plan is aspiration and injection today which is done under ultrasound guidance.  Plan to come back in 6 months for clinical recheck.  Follow-up at that time  Follow-Up Instructions: Return in about 6 months (around 09/05/2022).   Orders:  No orders of the defined types were placed in this encounter.  No orders of the defined types were placed in this encounter.     Procedures: Large Joint Inj: L glenohumeral on 03/07/2022 12:12 PM Indications: diagnostic evaluation and pain Details: 18 G 1.5 in needle, lateral approach  Arthrogram: No  Medications: 9 mL bupivacaine 0.5 %; 40 mg methylPREDNISolone acetate 40 MG/ML; 5 mL lidocaine 1 % Aspirate: 30 mL Outcome: tolerated well, no immediate complications Procedure, treatment alternatives, risks and  benefits explained, specific risks discussed. Consent was given by the patient. Immediately prior to procedure a time out was called to verify the correct patient, procedure, equipment, support staff and site/side marked as required. Patient was prepped and draped in the usual sterile fashion.       Clinical Data: No additional findings.  Objective: Vital Signs: There were no vitals taken for this visit.  Physical Exam:   Constitutional: Patient appears well-developed HEENT:  Head: Normocephalic.  Some resolving bruising around the left orbit Eyes:EOM are normal Neck: Normal range of motion Cardiovascular: Normal rate Pulmonary/chest: Effort normal Neurologic: Patient is alert Skin: Skin is warm Psychiatric: Patient has normal mood and affect   Ortho Exam: Ortho exam demonstrates functional deltoid.  There is anterior subluxation of the humeral head relative to the glenoid which is a chronic finding.  Motor or sensory function to the hand is intact.  Radial pulses intact, left-hand side.  Specialty Comments:  No specialty comments available.  Imaging: No results found.   PMFS History: Patient Active Problem List   Diagnosis Date Noted   Essential tremor 08/23/2021   Primary osteoarthritis of left shoulder 10/05/2020   Chronic left shoulder pain 08/10/2020   Chronic night sweats 08/09/2020   Multiple sclerosis (HCC) 11/02/2019   Gait disorder 11/02/2019   Weakness 11/02/2019   Urge incontinence of urine 11/02/2019   Foot-drop 10/06/2019  Senile osteoporosis 10/06/2019   Contracted bladder 06/17/2016   Injury of tendon of rotator cuff 04/25/2016   Neurogenic bladder 04/25/2016   Hypothyroidism 08/16/2010   Hypertrophic cardiomyopathy (HCC) 07/08/2010   Vitamin D deficiency 11/13/2009   Gastroesophageal reflux disease 08/14/2009   Past Medical History:  Diagnosis Date   Anxiety    High cholesterol    Multiple sclerosis (HCC)    Rotator cuff tear    left     Family History  Problem Relation Age of Onset   Heart attack Mother    Transient ischemic attack Mother    Arthritis Mother    Heart Problems Father     Past Surgical History:  Procedure Laterality Date   BLADDER SURGERY  2018   STEM placement    ULNAR NERVE REPAIR Right 2010   Social History   Occupational History   Occupation: Retired  Tobacco Use   Smoking status: Never   Smokeless tobacco: Never  Substance and Sexual Activity   Alcohol use: Yes    Comment: 2 drinks per week   Drug use: Never   Sexual activity: Not on file

## 2022-03-12 ENCOUNTER — Ambulatory Visit (HOSPITAL_COMMUNITY): Payer: Medicare Other | Admitting: Physician Assistant

## 2022-05-29 ENCOUNTER — Ambulatory Visit: Payer: Medicare Other | Admitting: Cardiology

## 2022-06-11 ENCOUNTER — Ambulatory Visit: Payer: Medicare Other | Admitting: Cardiology

## 2022-06-11 ENCOUNTER — Encounter: Payer: Self-pay | Admitting: Cardiology

## 2022-06-11 VITALS — BP 116/53 | HR 66 | Resp 16 | Ht 62.0 in | Wt 109.0 lb

## 2022-06-11 DIAGNOSIS — I480 Paroxysmal atrial fibrillation: Secondary | ICD-10-CM

## 2022-06-11 DIAGNOSIS — I421 Obstructive hypertrophic cardiomyopathy: Secondary | ICD-10-CM

## 2022-06-11 DIAGNOSIS — I447 Left bundle-branch block, unspecified: Secondary | ICD-10-CM

## 2022-06-11 NOTE — Progress Notes (Signed)
Primary Physician/Referring:  Tina Glazier, MD  Patient ID: Tina Webb, female    DOB: [birth year], 88 y.o.   MRN: [id number]  Chief Complaint  Patient presents with   Atrial Fibrillation   Hyperlipidemia   Loss of Consciousness        New Patient (Initial Visit)   AICD Problem   HPI:    Tina Webb  is a 88 y.o. history of HOCM, atrial fibrillation, LBBB, prolonged QT on EKG, sinus bradycardia, esophageal dysphagia, hypothyroid, multiple sclerosis, hyperlipidemia,  syncopal episode, with monitoring found to have persistent atrial fibrillation RVR   She has been on amiodarone and had cardioversion on 05/08/2022 and has been maintaining sinus rhythm.  Previously being followed at [facility] cardiology.  She wanted to establish with [place].  Her husband Tina Webb is a patient of mine.  Presently no specific complaints, states that she is doing well from cardiac standpoint. She is concerned that she was told that she need a defibrillator and also septal ablation for HOCM.   Past Medical History:  Diagnosis Date   Anxiety    High cholesterol    Multiple sclerosis (HCC)    Paroxysmal A-fib (HCC)    Rotator cuff tear    left   Past Surgical History:  Procedure Laterality Date   BLADDER SURGERY  2018   STEM placement    ULNAR NERVE REPAIR Right 2010   Family History  Problem Relation Age of Onset   Heart attack Mother    Transient ischemic attack Mother    Arthritis Mother    Heart Problems Father     Social History   Tobacco Use   Smoking status: Never   Smokeless tobacco: Never  Substance Use Topics   Alcohol use: Yes    Comment: 2 drinks per week   Marital Status: Married  ROS  Review of Systems  Cardiovascular:  Negative for chest pain, dyspnea on exertion and leg swelling.  Objective      06/11/2022    9:19 AM 03/05/2022    9:45 AM 02/25/2022    2:56 PM  Vitals with BMI  Height _0  _1    Weight 109 lbs    BMI 38.25    Systolic  [phone]  Diastolic 53 64 88  Pulse 66 93 77   SpO2: 98 %   Physical Exam Constitutional:      Comments: She is wheelchair-bound, has significant scoliosis.  Neck:     Vascular: No carotid bruit or JVD.  Cardiovascular:     Rate and Rhythm: Normal rate and regular rhythm.     Pulses:          Posterior tibial pulses are 1+ on the right side and 1+ on the left side.     Heart sounds: Normal heart sounds. No murmur heard.    No gallop.  Pulmonary:     Effort: Pulmonary effort is normal.     Breath sounds: Normal breath sounds.  Abdominal:     General: Bowel sounds are normal.     Palpations: Abdomen is soft.  Musculoskeletal:     Right lower leg: Edema (2+ below-knee pitting) present.     Left lower leg: Edema (2+ below-knee pitting) present.    Medications and allergies  No Known Allergies   Medication list   Current Outpatient Medications:    amiodarone (PACERONE) 200 MG tablet, Take 200 mg by mouth daily., Disp: , Rfl:    AMPYRA 10  MG TB12, Take 1 tablet (10 mg total) by mouth in the morning and at bedtime. BRAND NAME AMPYRA MEDICALLY NECESSARY, Disp: 60 tablet, Rfl: 11   apixaban (ELIQUIS) 2.5 MG TABS tablet, Take 1 tablet (2.5 mg total) by mouth 2 (two) times daily., Disp: 60 tablet, Rfl: 0   baclofen (LIORESAL) 10 MG tablet, Take 0.5 tablets (5 mg total) by mouth 2 (two) times daily., Disp: 90 each, Rfl: 4   clobetasol cream (TEMOVATE) 6.83 %, Apply 1 application topically as needed., Disp: , Rfl:    Cranberry-Vitamin C-Probiotic (AZO CRANBERRY PO), Take 1 tablet by mouth daily., Disp: , Rfl:    diclofenac Sodium (VOLTAREN) 1 % GEL, Apply 2 g topically as needed (joint pain)., Disp: , Rfl:    estradiol (ESTRING) 2 MG vaginal ring, Place 2 mg vaginally every 3 (three) months. follow package directions, Disp: , Rfl:    gabapentin (NEURONTIN) 300 MG capsule, Take 300 mg by mouth 3 (three) times daily., Disp: , Rfl:    hydrocortisone 2.5 % cream, Apply 1 application  topically as needed., Disp: , Rfl:    hydrOXYzine (ATARAX) 10 MG tablet, Take 10 mg by mouth at bedtime., Disp: , Rfl:    levothyroxine (SYNTHROID) 25 MCG tablet, Take 25 mcg by mouth daily before breakfast., Disp: , Rfl:    liothyronine (CYTOMEL) 5 MCG tablet, Take 5 mcg by mouth in the morning and at bedtime., Disp: , Rfl:    methenamine (HIPREX) 1 g tablet, Take 1 g by mouth daily., Disp: , Rfl:    metoprolol tartrate (LOPRESSOR) 25 MG tablet, Take 1 tablet (25 mg total) by mouth 2 (two) times daily., Disp: 90 tablet, Rfl: 3   mupirocin cream (BACTROBAN) 2 %, Apply 1 application topically as needed., Disp: , Rfl:    nitrofurantoin (MACRODANTIN) 100 MG capsule, Take 50 mg by mouth at bedtime., Disp: , Rfl:    pantoprazole (PROTONIX) 40 MG tablet, Take 40 mg by mouth daily., Disp: , Rfl:    polyethylene glycol (MIRALAX / GLYCOLAX) 17 g packet, Take 17 g by mouth daily., Disp: , Rfl:    Probiotic Product (PROBIOTIC PO), Take 2 tablets by mouth daily., Disp: , Rfl:    PROTOPIC 0.1 % ointment, Apply topically 2 (two) times daily. Tacrolimus, Disp: , Rfl:    rosuvastatin (CRESTOR) 20 MG tablet, Take 20 mg by mouth daily., Disp: , Rfl:    solifenacin (VESICARE) 5 MG tablet, Take 1 tablet by mouth daily., Disp: , Rfl:    triamcinolone cream (KENALOG) 0.1 %, Apply 1 application topically as needed., Disp: , Rfl:    VITAMIN D PO, Take 4,000 Units by mouth daily., Disp: , Rfl:    zolpidem (AMBIEN) 10 MG tablet, Take 10 mg by mouth at bedtime., Disp: , Rfl:  Laboratory examination:   Recent Labs    02/25/22 1020  NA 140  K 4.2  CL 111  CO2 22  GLUCOSE 113*  BUN 35*  CREATININE 0.68  CALCIUM 9.1  GFRNONAA >60    Lab Results  Component Value Date   GLUCOSE 113 (H) 02/25/2022   NA 140 02/25/2022   K 4.2 02/25/2022   CL 111 02/25/2022   CO2 22 02/25/2022   BUN 35 (H) 02/25/2022   CREATININE 0.68 02/25/2022   GFRNONAA >60 02/25/2022   CALCIUM 9.1 02/25/2022   PROT 5.7 (L) 02/25/2022    ALBUMIN 3.5 02/25/2022   BILITOT 0.5 02/25/2022   ALKPHOS 44 02/25/2022   AST 20 02/25/2022  ALT 21 02/25/2022   ANIONGAP 7 02/25/2022      Lab Results  Component Value Date   ALT 21 02/25/2022   AST 20 02/25/2022   ALKPHOS 44 02/25/2022   BILITOT 0.5 02/25/2022       Latest Ref Rng & Units 02/25/2022   10:20 AM  Hepatic Function  Total Protein 6.5 - 8.1 g/dL 5.7   Albumin 3.5 - 5.0 g/dL 3.5   AST 15 - 41 U/L 20   ALT 0 - 44 U/L 21   Alk Phosphatase 38 - 126 U/L 44   Total Bilirubin 0.3 - 1.2 mg/dL 0.5    Lipid Panel No results for input(s): "CHOL", "TRIG", "LDLCALC", "VLDL", "HDL", "CHOLHDL", "LDLDIRECT" in the last 8760 hours.  HEMOGLOBIN A1C No results found for: "HGBA1C", "MPG" TSH No results for input(s): "TSH" in the last 8760 hours.  External labs:   Labs 05/23/2022:  Total cholesterol 174, triglycerides 99, HDL 57, LDL 98.  Non-HDL cholesterol 117.  Serum glucose 86 mg, BUN 41, creatinine 0.72, EGFR 57 mL.  Potassium 4.6.  LFTs normal.  TSH 4.15.  Hb 15.6/HCT 46.6, platelets 209, normal indicis.  Labs 03/07/2022:  Potassium 4.8 3.5 - 5.3 MMOL/L  Chloride 110 98 - 110 MMOL/L  CO2 24 23 - 30 MMOL/L  BUN 27 (H) 8 - 24 MG/DL  Glucose 108 (H) 70 - 99 MG/DL  Creatinine 0.60 0.50 - 1.50 MG/DL  Calcium 8.4 (L) 8.5 - 10.5 MG/DL  Anion Gap 5 4 - 14 MMOL/L  Est. GFR 88 >=60 ML/MIN/1.73 M*2  Magnesium 2.2 1.8 - 2.4 MG/DL   TSH 03/07/2022 0.45 - 5.33 UIU/ML 4.02    Radiology:    Cardiac Studies:   Echocardiogram 12/19/2020: The left ventricular size is normal. Asymmetric left ventricular septal hypertrophy consistent with hypertrophic obstructive cardiomyopathy with normal wall motion and systolic function. LV ejection fraction 60-65%. There is systolic anterior motion of the mitral valve at rest with a > 83msec LVOT flow acceleration. I think MR Doppler did contaminate the spectral Doppler envelope. Left ventricular filling pattern is prolonged  relaxation. LAP may be elevated [[place] The right ventricle is normal in size and function. There is mild mitral regurgitation. There was insufficient TR detected to calculate RV systolic pressure. IVC size was normal.   EKG:   EKG 06/11/2022: Normal sinus rhythm with rate of 56 bpm, left bundle branch block.  QTc 537 ms. No further analysis.  05/15/2022 sinus bradycardia rate of 50 with LBBB QT prolongation QT 582   Assessment     ICD-10-CM   1. Paroxysmal atrial fibrillation (HCC)  I48.0 EKG 12-Lead    PCV ECHOCARDIOGRAM COMPLETE    ECHOCARDIOGRAM COMPLETE    2. HOCM (hypertrophic obstructive cardiomyopathy) (HCC)  I42.1 PCV ECHOCARDIOGRAM COMPLETE    ECHOCARDIOGRAM COMPLETE    3. LBBB (left bundle branch block)  I44.7        Orders Placed This Encounter  Procedures   EKG 12-Lead   PCV ECHOCARDIOGRAM COMPLETE    Standing Status:   Future    Standing Expiration Date:   06/12/2023   ECHOCARDIOGRAM COMPLETE    Standing Status:   Future    Standing Expiration Date:   06/12/2023    Order Specific Question:   Where should this test be performed    Answer:   External    Order Specific Question:   Perflutren DEFINITY (image enhancing agent) should be administered unless hypersensitivity or allergy exist  Answer:   Administer Perflutren    Order Specific Question:   Does this study need to be read by the Structural team/Level 3 readers?    Answer:   No    Order Specific Question:   Reason for exam-Echo    Answer:   Hypertrophic obstructive cardiomyopathy I42.1    No orders of the defined types were placed in this encounter.   Medications Discontinued During This Encounter  Medication Reason   Esomeprazole Magnesium ([place]) Change in therapy     Recommendations:   GILBERTO STRECK is a 88 y.o. history of HOCM, atrial fibrillation, LBBB, prolonged QT on EKG, sinus bradycardia, esophageal dysphagia, hypothyroid, multiple sclerosis,  hyperlipidemia,  syncopal episode, with monitoring found to have persistent atrial fibrillation RVR.  Patient states that she was asymptomatic when she was in persistent atrial fibrillation, syncope occurred while she was in her wheelchair, she fell without any premonitory symptoms.  She is presently wheelchair-bound due to multiple sclerosis.  She has been on amiodarone and had cardioversion on 05/08/2022 and has been maintaining sinus rhythm.  Previously being followed at [facility] cardiology.  She wanted to establish with [place].  Her husband Tina Webb is a patient of mine.   1. Paroxysmal atrial fibrillation ([place]) Patient is presently maintaining sinus rhythm on 200 mg of amiodarone daily, continue the same.  I would like to have continued bradycardia in view of severe LVOT obstruction due to HOCM.  She remains asymptomatic.  She has not had any recurrence of syncope.  Suspect atrial fibrillation led to syncope with hypotension.  2. HOCM (hypertrophic obstructive cardiomyopathy) ([place]) As dictated above, last echocardiogram was >a year ago, will repeat echocardiogram.  She remains asymptomatic without clinical evidence of heart failure.  She does have chronic lower extremity edema related to probably her posture and she is wheelchair-bound due to multiple sclerosis.  She was concerned about need for supplementation as recommended by prior physician, do not think this is necessary and I reassured her.  3. LBBB (left bundle branch block) Left bundle branch block is old.  Prolonged QT noted with left bundle branch block is within normal limits.  She does not need ICD, she does not need septal ablation as well.  She is presently 88 years of age and has done well.  Will continue to monitor her closely.  She has recently had a Zio patch placed which she has returned about a week ago, I will try to get the report of the same.  Would like to see her back in 3 months for close monitoring and if she remains  stable on a 59-monthbasis.  Presently 88years of age but doing very well.  She is accompanied by her husband at the bedside.    This was a 60 min ov encounter in evaluation of external records, discussion about high risk medication use and complex decision making about arrhythmias and syncope    JAdrian Prows MD, FWestern New York Children'S [facility] Center12/11/2021, 10:10 PM Office: [phone]

## 2022-06-14 ENCOUNTER — Other Ambulatory Visit: Payer: Medicare Other

## 2022-06-19 ENCOUNTER — Telehealth: Payer: Self-pay | Admitting: Neurology

## 2022-06-19 NOTE — Telephone Encounter (Signed)
Dr. Epimenio Foot, do you feel this is related? Would you like her to come in for appt?

## 2022-06-19 NOTE — Progress Notes (Signed)
Extended outpatient EKG monitoring 14 days starting 05/17/2022: Predominant rhythm is sinus rhythm with IVCD.  Minimum heart rate 50 bpm at 4 AM and maximum of 86 bpm at 6 AM.  There were 13 brief atrial tachycardia episodes, longest 11 beats. No atrial fibrillation. Occasional PVCs, ventricular couplets and triplets noted, rare. No heart block, 1 patient activated event correlated with PAC.

## 2022-06-19 NOTE — Telephone Encounter (Signed)
Pt states that in the last year she has experienced rashes all over her body and her Dermatologist thinks it may be due to her MS. Pt would like to discuss with RN or P

## 2022-06-20 NOTE — Telephone Encounter (Signed)
Called pt back. Relayed Dr. Bonnita Hollow message. She verbalized understanding.   She states [employer] has told her she is eligible for a new wheelchair. Needs to seen in April 2024. Scheduled appt for 10/07/22 at 1:30pm with Dr. Epimenio Foot. She will touch base with them to see if there are forms she needs to bring or fax to [place] for appt. If they fax anything, she will let us know.

## 2022-06-24 ENCOUNTER — Telehealth: Payer: Self-pay

## 2022-06-24 NOTE — Telephone Encounter (Signed)
Normal rhythm and NO significant bad rhythm noted. I had sent her myChart message  Extended outpatient EKG monitoring 14 days starting 05/17/2022: Predominant rhythm is sinus rhythm with IVCD.  Minimum heart rate 50 bpm at 4 AM and maximum of 86 bpm at 6 AM.  There were 13 brief atrial tachycardia episodes, longest 11 beats. No atrial fibrillation. Occasional PVCs, ventricular couplets and triplets noted, rare. No heart block, 1 patient activated event correlated with PAC.

## 2022-06-24 NOTE — Telephone Encounter (Signed)
Patient left a voicemail wanting to know her results from the second heart monitor, she spoke with [employer] and they told her they sent the report 06/08/2022. I don't see where we have received them.  Please advice and I will return her call. Call back number is: [phone]

## 2022-06-25 ENCOUNTER — Other Ambulatory Visit: Payer: Self-pay

## 2022-06-25 NOTE — Telephone Encounter (Signed)
Spoke with patient. She acknowledged understanding.

## 2022-07-15 ENCOUNTER — Encounter: Payer: Self-pay | Admitting: Cardiology

## 2022-07-17 ENCOUNTER — Telehealth: Payer: Self-pay | Admitting: Neurology

## 2022-07-17 DIAGNOSIS — G35 Multiple sclerosis: Secondary | ICD-10-CM

## 2022-07-17 DIAGNOSIS — R269 Unspecified abnormalities of gait and mobility: Secondary | ICD-10-CM

## 2022-07-17 MED ORDER — AMPYRA 10 MG PO TB12: 10.0000 mg | ORAL_TABLET | Freq: Two times a day (BID) | ORAL | 2 refills | Status: DC

## 2022-07-17 NOTE — Telephone Encounter (Signed)
Refills sent to [facility]. Pending appt on 10/07/22. I also attempted to do a PA on [facility]: [id number] and received this message:  Authorization already on file for this request. Authorization starting on 07/08/2021 and ending on 07/08/2023.

## 2022-07-17 NOTE — Telephone Encounter (Signed)
Pt is calling. Stating she needs a renewal for medication AMPYRA 10 MG TB12 sent to [facility].

## 2022-08-14 ENCOUNTER — Telehealth: Payer: Self-pay | Admitting: Neurology

## 2022-08-14 NOTE — Telephone Encounter (Signed)
Pt said hands are shaking like a leave, cannot put on any lipstick, whether in bed or wheelchair feel like fall all ways to the left. Would like a early appt.  Informed pt nurse will call back if has an early appt available.

## 2022-08-15 NOTE — Telephone Encounter (Signed)
Noted  

## 2022-08-15 NOTE — Telephone Encounter (Signed)
Called pt. She accepted appointment for 2/14 @ 9am. Pt is asking is there another day available, stated if not she will just keep this appointment.

## 2022-08-15 NOTE — Telephone Encounter (Signed)
Please call pt. There is no other date/time to offer at this time. We will have her keep scheduled appt

## 2022-08-15 NOTE — Telephone Encounter (Signed)
Please call pt back and offer 08/21/22 at 9am with Dr. Felecia Shelling for sooner appt

## 2022-08-15 NOTE — Telephone Encounter (Signed)
Called pt back. She stated she will just keep this appointment.

## 2022-08-21 ENCOUNTER — Encounter: Payer: Self-pay | Admitting: Neurology

## 2022-08-21 ENCOUNTER — Ambulatory Visit (INDEPENDENT_AMBULATORY_CARE_PROVIDER_SITE_OTHER): Payer: Medicare Other | Admitting: Neurology

## 2022-08-21 VITALS — BP 100/62 | HR 57

## 2022-08-21 DIAGNOSIS — R269 Unspecified abnormalities of gait and mobility: Secondary | ICD-10-CM

## 2022-08-21 DIAGNOSIS — N3941 Urge incontinence: Secondary | ICD-10-CM

## 2022-08-21 DIAGNOSIS — G250 Essential tremor: Secondary | ICD-10-CM

## 2022-08-21 DIAGNOSIS — G801 Spastic diplegic cerebral palsy: Secondary | ICD-10-CM | POA: Diagnosis not present

## 2022-08-21 DIAGNOSIS — G35 Multiple sclerosis: Secondary | ICD-10-CM | POA: Diagnosis not present

## 2022-08-21 DIAGNOSIS — R42 Dizziness and giddiness: Secondary | ICD-10-CM

## 2022-08-21 MED ORDER — PROPRANOLOL HCL ER 120 MG PO CP24: 120.0000 mg | ORAL_CAPSULE | Freq: Every day | ORAL | 3 refills | Status: DC

## 2022-08-21 NOTE — Progress Notes (Addendum)
[facility]  PATIENT: Tina Webb DOB: 28-Jan-1934  REFERRING DOCTOR OR PCP:  Tina Webb (PCP); Tina Webb (neurology) SOURCE: Patient, notes from Dr. Luana Shu, imaging reports, MRI images personally reviewed.  _________________________________   HISTORICAL  CHIEF COMPLAINT:  Chief Complaint  Patient presents with   Room 10    Pt is here with her Husband. Pt states that things have been awful. Pt states that she has fallen on Saturday. Pt states that she can't hold a fork when eating. Pt states that she has muscle weakness, tingling, and burning in her legs. Pt states no trouble swallowing when eating.     HISTORY OF PRESENT ILLNESS:  Tina Webb is a 89 y.o. woman with multiple sclerosis diagnosed in 1984.  Update 08/21/2022 Her tremor is getting much worse and she has trouble holding a fork.    She feels like she is going to fall whenever she stands and she feels she is going to fall to the left.  She also has had some falls with legs giving out.   With one fall the husband grabbed her left arm and she had a large scrape.   With another she hurt her ankle but no fracture.      She is wheelchair bound and cannot do much for herself like dressing and needs help to transfer,          She has spells where she feels pished to the left -- these are random but more likely to occur with position changes.      Solifenacin has helped her urinary incontinence and is better tolerated than Detrol.   She goes to urology.     She is on metoprolol for her tremor and heart and it helpd.  It likely helped her not  have a rapid AFib (HR was 95).    She sees Tina Webb.    She is feeling weaker than she felt at her last visit.   She uses an Geophysical data processor.   She is often able to transfer independently from her chair but sometimes needs help.  Arm strength is stable but she has a torn rotator cuff not operable).   She gets q71monthshots into the shoulder tha  helps the pain some.    Ampyra had helped strength.   She never tried amantadine.      She also notes reduced  hand coordination.   She has a tremor in her right hand that has worsened this past year.   Writing and makeup is more difficult.     She cannot use a walker.   She has right > left weakness and spasticity in legs.    She has some numbness that is not bothersome or painful.   She has a lot of urinary urgency and takes solifenacin which is a little better than oxybutynn.   She still dribbles some.  She has constipation despite fiber and Miralax.  Sh gets edema and she uses a leg lift function on her wheelchair with some benefit.   She has a trainer come to help exercise a few times a week.     She has mild fatigue.   Mood is fine.    Cognition is doing well  She had Covid and was much more tired during Covid and still feels a little weaker  MS History: She was diagnosed with MS in 1984 after presenting with numbness in both legs.  She had her husband have recently moved to  the [place] area from [place].  While in [place], she was seen Dr. Lovena Webb for her multiple sclerosis.    She reports that the numbness that presented in 1984 was present in both legs.  She improved over time and did not receive any steroids at that time..  Initially, she was not on any DMT but started Copaxone in the 1990's.   She had progression and over the years was also on Rebif, Tecfidera, Aubagio, Zinbryta and Gilenya.  On over the medications, she continued to have slow worsening of her disability.   She did have some definite motor and visual exacerbations in the 1980's and 1990's.  She had a severe UTI in 2018 and she stopped all DMTs at that time.    She has a bladder stimulator (not functional) and is unable to get an MRI.   Her last MRI was around 2013.  She has worked with a Physiological scientist and done physical therapy.  This has helped her gain better ability to transfer.   MRI of the brain  and cervical spine from 06/10/2012 was personally reviewed.  The MRI of the cervical spine showed T2 hyperintense foci adjacent to C1-C2, C4 and C7 posteriorly and adjacent to C5-C6 to the left.  Additionally there are degenerative changes at C5-C6 causing mild spinal stenosis and right greater than left foraminal narrowing.  The MRI of the brain showed multiple T2/FLAIR hyperintense foci in the periventricular, juxtacortical and deep white matter of both hemispheres.  Additionally there is a T2 hyperintense focus anteriorly within the pontomedullary junction towards the right.  REVIEW OF SYSTEMS: Constitutional: No fevers, chills, sweats, or change in appetite.  She has fatigue.  Eyes: No visual changes, double vision, eye pain Ear, nose and throat: No hearing loss, ear pain, nasal congestion, sore throat Cardiovascular: No chest pain, palpitations Respiratory:  No shortness of breath at rest or with exertion.   No wheezes GastrointestinaI: No nausea, vomiting, diarrhea, abdominal pain, fecal incontinence Genitourinary: Urinary frequency, urgency and multiple UTIs Musculoskeletal: She often has left shoulder pain.   Integumentary: No rash, pruritus, skin lesions Neurological: as above Psychiatric: No depression at this time.  No anxiety Endocrine: No palpitations, diaphoresis, change in appetite, change in weigh or increased thirst Hematologic/Lymphatic:  No anemia, purpura, petechiae. Allergic/Immunologic: No itchy/runny eyes, nasal congestion, recent allergic reactions, rashes  ALLERGIES: No Known Allergies  HOME MEDICATIONS:  Current Outpatient Medications:    amiodarone (PACERONE) 200 MG tablet, Take 200 mg by mouth daily., Disp: , Rfl:    AMPYRA 10 MG TB12, Take 1 tablet (10 mg total) by mouth in the morning and at bedtime. BRAND NAME AMPYRA MEDICALLY NECESSARY, Disp: 60 tablet, Rfl: 2   baclofen (LIORESAL) 10 MG tablet, Take 0.5 tablets (5 mg total) by mouth 2 (two) times daily., Disp:  90 each, Rfl: 4   clobetasol cream (TEMOVATE) [id number] %, Apply 1 application topically as needed., Disp: , Rfl:    Cranberry-Vitamin C-Probiotic (AZO CRANBERRY PO), Take 1 tablet by mouth daily., Disp: , Rfl:    diclofenac Sodium (VOLTAREN) 1 % GEL, Apply 2 g topically as needed (joint pain)., Disp: , Rfl:    estradiol (ESTRING) 2 MG vaginal ring, Place 2 mg vaginally every 3 (three) months. follow package directions, Disp: , Rfl:    gabapentin (NEURONTIN) 300 MG capsule, Take 300 mg by mouth 3 (three) times daily., Disp: , Rfl:    hydrocortisone 2.5 % cream, Apply 1 application topically as needed., Disp: ,  Rfl:    hydrOXYzine (ATARAX) 10 MG tablet, Take 10 mg by mouth at bedtime., Disp: , Rfl:    levothyroxine (SYNTHROID) 25 MCG tablet, Take 25 mcg by mouth daily before breakfast., Disp: , Rfl:    liothyronine (CYTOMEL) 5 MCG tablet, Take 5 mcg by mouth in the morning and at bedtime., Disp: , Rfl:    methenamine (HIPREX) 1 g tablet, Take 1 g by mouth daily., Disp: , Rfl:    metoprolol tartrate (LOPRESSOR) 25 MG tablet, Take 1 tablet (25 mg total) by mouth 2 (two) times daily., Disp: 90 tablet, Rfl: 3   mupirocin cream (BACTROBAN) 2 %, Apply 1 application topically as needed., Disp: , Rfl:    nitrofurantoin (MACRODANTIN) 100 MG capsule, Take 50 mg by mouth at bedtime., Disp: , Rfl:    pantoprazole (PROTONIX) 40 MG tablet, Take 40 mg by mouth daily., Disp: , Rfl:    polyethylene glycol (MIRALAX / GLYCOLAX) 17 g packet, Take 17 g by mouth daily., Disp: , Rfl:    Probiotic Product (PROBIOTIC PO), Take 2 tablets by mouth daily., Disp: , Rfl:    PROTOPIC 0.1 % ointment, Apply topically 2 (two) times daily. Tacrolimus, Disp: , Rfl:    rosuvastatin (CRESTOR) 20 MG tablet, Take 20 mg by mouth daily., Disp: , Rfl:    solifenacin (VESICARE) 5 MG tablet, Take 1 tablet by mouth daily., Disp: , Rfl:    triamcinolone cream (KENALOG) 0.1 %, Apply 1 application topically as needed., Disp: , Rfl:    VITAMIN D PO,  Take 4,000 Units by mouth daily., Disp: , Rfl:    zolpidem (AMBIEN) 10 MG tablet, Take 10 mg by mouth at bedtime., Disp: , Rfl:    apixaban (ELIQUIS) 2.5 MG TABS tablet, Take 1 tablet (2.5 mg total) by mouth 2 (two) times daily., Disp: 60 tablet, Rfl: 0  PAST MEDICAL HISTORY: Past Medical History:  Diagnosis Date   Anxiety    High cholesterol    Multiple sclerosis (HCC)    Paroxysmal A-fib (HCC)    Rotator cuff tear    left    PAST SURGICAL HISTORY: Past Surgical History:  Procedure Laterality Date   BLADDER SURGERY  2018   STEM placement    ULNAR NERVE REPAIR Right 2010    FAMILY HISTORY: Family History  Problem Relation Age of Onset   Heart attack Mother    Transient ischemic attack Mother    Arthritis Mother    Heart Problems Father     SOCIAL HISTORY:  Social History   Socioeconomic History   Marital status: Married    Spouse name: Delrae Alfred   Number of children: 3   Years of education: [employer] education level: Not on file  Occupational History   Occupation: Retired  Tobacco Use   Smoking status: Never   Smokeless tobacco: Never  Vaping Use   Vaping Use: Never used  Substance and Sexual Activity   Alcohol use: Yes    Comment: 2 drinks per week   Drug use: Never   Sexual activity: Not on file  Other Topics Concern   Not on file  Social History Narrative   Originally right handed but had surgery and now uses left hand for eating and other activities   Caffeine use: 2 cups coffee per day   Lives with husband   Lives at [place] with husband in [place]   Social Determinants of Health   Financial Resource Strain: Not on file  Food Insecurity: Not  on file  Transportation Needs: Not on file  Physical Activity: Not on file  Stress: Not on file  Social Connections: Not on file  Intimate Partner Violence: Not on file     PHYSICAL EXAM  Vitals:   08/21/22 0919  BP: 100/62  Pulse: (!) 57     There is no height or weight on file  to calculate BMI.   General: The patient is well-developed and well-nourished and in no acute distress.       HEENT:  Head is [place]/AT.  Sclera are anicteric.   .  Skin: Extremities are without rash but she has bilateral pedal edema and some skin abrasions.    Neurologic Exam  Mental status: The patient is alert and oriented x 3 at the time of the examination. The patient has apparent normal recent and remote memory, with an apparently normal attention span and concentration ability.   Speech is normal.  Cranial nerves: Extraocular movements are full.  Color vision was symmetric.  Facial strength and sensation is normal.. No obvious hearing deficits are noted.  Motor: Her intention tremor in the hands has improved.  Muscle bulk is reduced in the ulnar innervated hand muscles bilaterally, right worse than left.   Tone is increased in the right leg.  . Strength is 3/5 in the ulnar innervated hand muscles on the right.  Proximal muscles on the right 4/5.  In the left arm, strength was 4 to 4+ /5.  In the legs, strength was 2/5 in the left hip flexors and 4-/5 in the quadriceps and 3/5 elsewhere in the leg.  In the right leg, strength was 1/5 at the hip flexors, 2+/5 in the quadricep muscles and 2- to 2 in the lower leg.  Sensory: Sensory testing was intact to touch and vibration sensation in the legs..  Coordination: Cerebellar testing reveals good finger-nose-finger and she can't do heel-to-shin bilaterally.  Other: She has a rapid tremor in her hands that increases with intention.  Gait and station: She cannot stand or walk  Reflexes: Deep tendon reflexes are increased in legs, right > left.          ASSESSMENT AND PLAN  Multiple sclerosis (HCC)  Gait disorder  Spastic diplegia (HCC)  Urge incontinence of urine  Essential tremor  Vertigo  1.   She has an inactive form of secondary progressive MS with slow progression..  This is unlikely to respond much to medication.   Therefore, we will have her continue off of a disease modifying therapy  2.   She felt some benefit from [place] and will continue..   3.   Continue metoprolol for tremor - check with cardiology about increasing dose (also on amiodarone).  I would like to increase the dose from 25 mg to 50 mg.  Continue baclofen and other medications.   4.  Episodes of vertigo are brief and sometimes positional --- Lajean Saver did not lateralize.  rial of Nestor Lewandowsky exercises 5.  She will be needing a new electric wheelchair soon and will come back in June for a mobility evaluation.  The wheelchair is necessary for her to perform her activities of daily living.  She will need a wheelchair with ability to elevate and recline.  She will need leg elevation.  Control should be on either side. ,6. Return in 6 months or sooner if there are new or worsening neurologic symptoms.   42-minute office visit with the majority of the time spent face-to-face for history and  physical, discussion/counseling and decision-making.  Additional time with record review and documentation.  [id number]:  This visit is part of a comprehensive longitudinal care medical relationship regarding the patients primary diagnosis of SPMS and related concerns.    Mone Commisso A. Felecia Shelling, MD, [facility] [id number], [id number] AM Certified in Neurology, Clinical Neurophysiology, Sleep Medicine and Neuroimaging  [facility] [address] [place], [place] [address] [phone]

## 2022-08-21 NOTE — Addendum Note (Signed)
Addended by: Britt Bottom on: 08/21/2022 05:50 PM   Modules accepted: Orders

## 2022-08-23 ENCOUNTER — Other Ambulatory Visit: Payer: Self-pay

## 2022-08-23 ENCOUNTER — Encounter (HOSPITAL_BASED_OUTPATIENT_CLINIC_OR_DEPARTMENT_OTHER): Payer: Self-pay | Admitting: Emergency Medicine

## 2022-08-23 ENCOUNTER — Emergency Department (HOSPITAL_BASED_OUTPATIENT_CLINIC_OR_DEPARTMENT_OTHER)
Admission: EM | Admit: 2022-08-23 | Discharge: 2022-08-23 | Disposition: A | Discharge status: Home / Self Care | Payer: Medicare Other | Attending: Emergency Medicine | Admitting: Emergency Medicine

## 2022-08-23 ENCOUNTER — Emergency Department (HOSPITAL_BASED_OUTPATIENT_CLINIC_OR_DEPARTMENT_OTHER): Payer: Medicare Other

## 2022-08-23 DIAGNOSIS — Z7901 Long term (current) use of anticoagulants: Secondary | ICD-10-CM | POA: Diagnosis not present

## 2022-08-23 DIAGNOSIS — W2201XA Walked into wall, initial encounter: Secondary | ICD-10-CM | POA: Insufficient documentation

## 2022-08-23 DIAGNOSIS — E039 Hypothyroidism, unspecified: Secondary | ICD-10-CM | POA: Diagnosis not present

## 2022-08-23 DIAGNOSIS — Z79890 Hormone replacement therapy (postmenopausal): Secondary | ICD-10-CM | POA: Insufficient documentation

## 2022-08-23 DIAGNOSIS — S81811A Laceration without foreign body, right lower leg, initial encounter: Secondary | ICD-10-CM | POA: Diagnosis present

## 2022-08-23 NOTE — ED Notes (Signed)
Called [facility] for transport home @10$ :39am

## 2022-08-23 NOTE — ED Notes (Signed)
Report to [facility] at [facility]

## 2022-08-23 NOTE — ED Triage Notes (Addendum)
Pt is in motorized w/c  at Nursing home and had to go to [facility] and  went to fast in chair and went into wall has new abrasions/ skin tears rt knee, pt is on eliquis , Bleeding controlled at this time   did not hit head , no LOC per nursing home , has normal confusion as to date and time,  pt has normal swelling to both lower legs and wounds to left forearm, rt heel and left lower leg that are beeing followed by a wound care center

## 2022-08-23 NOTE — Discharge Instructions (Signed)
Your visit emergency department today was overall reassuring.  Skin tear repaired using tissue adhesive.  Try to avoid submersion of tissue adhesive and baths or other bodies of water.  The skin glue will absorb over time as wound heals.  Avoid picking at affected area.  Please do not hesitate to return to emergency department for worrisome signs and symptoms we discussed become apparent.

## 2022-08-23 NOTE — ED Notes (Signed)
Husband here and [facility] here to get pt, attempted x 2 to call report to [facility] at [facility] [phone] without success,

## 2022-08-23 NOTE — ED Provider Notes (Signed)
[facility] Provider Note   CSN: [id number] Arrival date & time: 08/23/22  [id number]     History  Chief Complaint  Patient presents with   Abrasion    Tina Webb is a 89 y.o. female.  HPI   89 year old female presents emergency department with complaint of skin tear.  Patient with history of MS and gets around in wheelchair.  Patient states that she was in her wheelchair and was attempting to go into a different room when and her sleeve caught the control of her wheelchair hitting her right lower leg against the wall and causing skin tear.  Bleeding controlled with direct pressure.  No fall, trauma to head.  Patient remained in wheelchair throughout duration of injury.  Patient with history of wound noted on left lateral lower leg, right heel, left forearm from prior similar injuries followed by wound care outpatient.  Patient denies any weakness or sensory deficits from baseline of right lower extremity.  Past medical history significant for MS, paroxysmal atrial fibrillation on Eliquis, hypercholesterolemia, hypothyroidism, hypertrophic cardiomyopathy  Home Medications Prior to Admission medications   Medication Sig Start Date End Date Taking? Authorizing Provider  amiodarone (PACERONE) 200 MG tablet Take 200 mg by mouth daily. 04/23/22   [provider]  AMPYRA 10 MG TB12 Take 1 tablet (10 mg total) by mouth in the morning and at bedtime. BRAND NAME AMPYRA MEDICALLY NECESSARY 07/17/22   Sater, Nanine Means, MD  apixaban (ELIQUIS) 2.5 MG TABS tablet Take 1 tablet (2.5 mg total) by mouth 2 (two) times daily. 02/25/22 06/11/22  Mickie Hillier, PA-C  baclofen (LIORESAL) 10 MG tablet Take 0.5 tablets (5 mg total) by mouth 2 (two) times daily. 08/23/21   Sater, Nanine Means, MD  clobetasol cream (TEMOVATE) [id number] % Apply 1 application topically as needed.    [provider]  Cranberry-Vitamin C-Probiotic (AZO CRANBERRY PO) Take 1  tablet by mouth daily.    [provider]  diclofenac Sodium (VOLTAREN) 1 % GEL Apply 2 g topically as needed (joint pain).    [provider]  estradiol (ESTRING) 2 MG vaginal ring Place 2 mg vaginally every 3 (three) months. follow package directions    [provider]  gabapentin (NEURONTIN) 300 MG capsule Take 300 mg by mouth 3 (three) times daily.    [provider]  hydrocortisone 2.5 % cream Apply 1 application topically as needed.    [provider]  hydrOXYzine (ATARAX) 10 MG tablet Take 10 mg by mouth at bedtime. 04/30/22   [provider]  levothyroxine (SYNTHROID) 25 MCG tablet Take 25 mcg by mouth daily before breakfast.    [provider]  liothyronine (CYTOMEL) 5 MCG tablet Take 5 mcg by mouth in the morning and at bedtime.    [provider]  methenamine (HIPREX) 1 g tablet Take 1 g by mouth daily.    [provider]  mupirocin cream (BACTROBAN) 2 % Apply 1 application topically as needed.    [provider]  nitrofurantoin (MACRODANTIN) 100 MG capsule Take 50 mg by mouth at bedtime.    [provider]  pantoprazole (PROTONIX) 40 MG tablet Take 40 mg by mouth daily. 04/01/22   [provider]  polyethylene glycol (MIRALAX / GLYCOLAX) 17 g packet Take 17 g by mouth daily.    [provider]  Probiotic Product (PROBIOTIC PO) Take 2 tablets by mouth daily.    [provider]  propranolol ER (INDERAL LA) 120 MG 24 hr capsule Take 1 capsule (120 mg total) by mouth daily. 08/21/22   Sater, Nanine Means, MD  PROTOPIC 0.1 % ointment Apply topically 2 (two) times daily. Tacrolimus 04/09/22   [provider]  rosuvastatin (CRESTOR) 20 MG tablet Take 20 mg by mouth daily.    [provider]  solifenacin (VESICARE) 5 MG tablet Take 1 tablet by mouth daily. 02/21/22   [provider]  triamcinolone cream (KENALOG) 0.1 % Apply 1 application topically as  needed.    [provider]  VITAMIN D PO Take 4,000 Units by mouth daily.    [provider]  zolpidem (AMBIEN) 10 MG tablet Take 10 mg by mouth at bedtime.    [provider]      Allergies    Patient has no known allergies.    Review of Systems   Review of Systems  All other systems reviewed and are negative.   Physical Exam Updated Vital Signs BP 118/63 (BP Location: Right Arm)   Pulse 60   Temp (!) 97.5 F (36.4 C) (Oral)   Resp 20   Ht 5' 2"$  (1.575 m)   Wt 49.4 kg   SpO2 100%   BMI 19.92 kg/m  Physical Exam Vitals and nursing note reviewed.  Constitutional:      General: She is not in acute distress.    Appearance: She is well-developed.  HENT:     Head: Normocephalic and atraumatic.  Eyes:     Conjunctiva/sclera: Conjunctivae normal.  Cardiovascular:     Rate and Rhythm: Normal rate and regular rhythm.     Heart sounds: No murmur heard. Pulmonary:     Effort: Pulmonary effort is normal. No respiratory distress.     Breath sounds: Normal breath sounds.  Abdominal:     Palpations: Abdomen is soft.     Tenderness: There is no abdominal tenderness.  Musculoskeletal:        General: No swelling.     Cervical back: Neck supple.     Comments: Full range of motion of bilateral hip, knee, ankle, digits of the lower extremities with assistance.  Note obvious bony tenderness to palpation of right tibia, fibula, ankle and digits on the right side.  Pedal pulses 2+ bilaterally.  No sensory deficits distally.  Circular skin tear noted on right medial lower tibia.  No active bleeding appreciated.  Skin:    General: Skin is warm and dry.     Capillary Refill: Capillary refill takes less than 2 seconds.     Comments: Small wound noted on right heel with good granulation tissue no extending erythema, palpable fluctuant/indurated tissue.  Healing skin abrasion noted on left lateral tibia with good granulation tissue, no extending erythema, indurated  tissue, palpable fluctuance.  Neurological:     Mental Status: She is alert.  Psychiatric:        Mood and Affect: Mood normal.     ED Results / Procedures / Treatments   Labs (all labs ordered are listed, but only abnormal results are displayed) Labs Reviewed - No data to display  EKG None  Radiology DG Tibia/Fibula Right  Result Date: 08/23/2022 CLINICAL DATA:  Pain. EXAM: RIGHT TIBIA AND FIBULA - 2 VIEW COMPARISON:  None Available. FINDINGS: There is no evidence of fracture or other focal bone lesions. Diffusely demineralized appearance of the bones. Soft tissues are unremarkable. IMPRESSION: Negative. Electronically Signed   By: Emmit Alexanders M.D.   On:  08/23/2022 10:15    Procedures .Marland KitchenLaceration Repair  Date/Time: 08/23/2022 10:34 AM  Performed by: Wilnette Kales, PA Authorized by: Wilnette Kales, PA   Consent:    Consent obtained:  Verbal   Consent given by:  Patient   Risks, benefits, and alternatives were discussed: yes     Risks discussed:  Infection, need for additional repair, nerve damage, poor wound healing, poor cosmetic result, pain, retained foreign body, tendon damage and vascular damage   Alternatives discussed:  Observation, referral, delayed treatment and no treatment Universal protocol:    Procedure explained and questions answered to patient or proxy's satisfaction: yes     Patient identity confirmed:  Verbally with patient Anesthesia:    Anesthesia method:  None Laceration details:    Location:  Leg   Leg location:  R lower leg   Length (cm):  12 Pre-procedure details:    Preparation:  Patient was prepped and draped in usual sterile fashion and imaging obtained to evaluate for foreign bodies Exploration:    Limited defect created (wound extended): no     Hemostasis achieved with:  Direct pressure   Imaging obtained: x-ray     Imaging outcome: foreign body noted     Wound exploration: wound explored through full range of motion and entire  depth of wound visualized     Contaminated: no   Treatment:    Area cleansed with:  Saline   Amount of cleaning:  Extensive   Irrigation solution:  Sterile saline   Irrigation volume:  500cc   Irrigation method:  Syringe   Visualized foreign bodies/material removed: no     Debridement:  None   Undermining:  None   Scar revision: no   Skin repair:    Repair method:  Tissue adhesive Approximation:    Approximation:  Close Repair type:    Repair type:  Simple Post-procedure details:    Dressing:  Open (no dressing)   Procedure completion:  Tolerated well, no immediate complications     Medications Ordered in ED Medications - No data to display  ED Course/ Medical Decision Making/ A&P                             Medical Decision Making Amount and/or Complexity of Data Reviewed Radiology: ordered.   This patient presents to the ED for concern of skin tear, this involves an extensive number of treatment options, and is a complaint that carries with it a high risk of complications and morbidity.  The differential diagnosis includes fracture, strain sprain, dislocation, skin tear   Co morbidities that complicate the patient evaluation  See HPI   Additional history obtained:  Additional history obtained from EMR External records from outside source obtained and reviewed including hospital records   Lab Tests:  N/a   Imaging Studies ordered:  I ordered imaging studies including x-ray right tib-fib I independently visualized and interpreted imaging which showed no acute osseous abnormality I agree with the radiologist interpretation   Cardiac Monitoring: / EKG:  The patient was maintained on a cardiac monitor.  I personally viewed and interpreted the cardiac monitored which showed an underlying rhythm of: Sinus rhythm   Consultations Obtained:  N/a   Problem List / ED Course / Critical interventions / Medication management  Skin tear Reevaluation of the  patient showed that the patient stayed the same I have reviewed the patients home medicines and have made adjustments as  needed   Social Determinants of Health:  Denies tobacco, licit drug use.   Test / Admission - Considered:  Skin tear Vitals signs within normal range and stable throughout visit. Imaging studies significant for: See above Patient presents with isolated skin tear.  Skin tear repaired using tissue adhesive after copious amounts of saline irrigation while in the emergency department.  Patient tolerated procedure well.  Patient educated regarding proper wound care with tissue adhesive.  No evidence of underlying fracture/dislocation.  Recommended follow-up as routinely with wound care weekly as she already has.  Treatment plan discussed at length with patient and she acknowledged understanding was agreeable to said plan. Worrisome signs and symptoms were discussed with the patient, and the patient acknowledged understanding to return to the ED if noticed. Patient was stable upon discharge.          Final Clinical Impression(s) / ED Diagnoses Final diagnoses:  Skin tear of right lower leg without complication, initial encounter    Rx / DC Orders ED Discharge Orders     None         Wilnette Kales, [place] 08/23/22 1035    Elgie Congo, MD 08/23/22 [phone]

## 2022-08-28 ENCOUNTER — Ambulatory Visit: Payer: Medicare Other | Admitting: Orthopedic Surgery

## 2022-09-03 NOTE — Telephone Encounter (Signed)
Pt is calling stated she can't take the generic brand of AMPYRA 10 MG TB12. States she needs Dr. Felecia Shelling to send them information to insurance stating she needs brand name.

## 2022-09-03 NOTE — Telephone Encounter (Signed)
[place] at [phone]. Spoke w/ Desiree/pharm tech. She transferred me to specialty pharmacy. Spoke w/ [place]. Confirmed Ampyra brand was delivered to pt on 08/21/22.   Transferred me to PA team, spoke with tech. Confirmed PA on file for brand name and good until 07/08/23. Auth# [id number]. Advised pharmacy will need to call them to get issue resolved since they cannot get claim to go through on their end. She placed me on hold to get rep on the phone from pharmacy. Unable to hold any longer.   Will need to call [place] back to have them call help desk with insurance to trouble shoot claim/rx since PA is on file.

## 2022-09-03 NOTE — Telephone Encounter (Signed)
Please call pt back. Rx sent 07/17/22 was for brand name Ampya authorization/approval on file is for brand name (effective  07/08/2021 and ending on 07/08/2023.)

## 2022-09-04 NOTE — Telephone Encounter (Signed)
[facility] at [phone]. Spoke w/ Olegario Messier tech.  She transferred me to specialty department. Spoke w/ Legrand Como. Provided auth# for brand Ampyra Auth# [id number]. If they continue to have issues running the claim, they will call pharmacy help desk number.

## 2022-09-05 ENCOUNTER — Ambulatory Visit: Payer: Medicare Other | Admitting: Orthopedic Surgery

## 2022-09-09 NOTE — Telephone Encounter (Signed)
Called pt and informed her of message nurse [facility] sent. Pt thank me for calling her.

## 2022-09-10 ENCOUNTER — Ambulatory Visit: Payer: Medicare Other | Admitting: Cardiology

## 2022-09-10 ENCOUNTER — Encounter: Payer: Self-pay | Admitting: Cardiology

## 2022-09-10 VITALS — BP 96/42 | HR 47 | Resp 19 | Ht 62.0 in | Wt 115.0 lb

## 2022-09-10 DIAGNOSIS — I447 Left bundle-branch block, unspecified: Secondary | ICD-10-CM

## 2022-09-10 DIAGNOSIS — I480 Paroxysmal atrial fibrillation: Secondary | ICD-10-CM

## 2022-09-10 DIAGNOSIS — I421 Obstructive hypertrophic cardiomyopathy: Secondary | ICD-10-CM

## 2022-09-10 DIAGNOSIS — Z66 Do not resuscitate: Secondary | ICD-10-CM

## 2022-09-10 NOTE — Progress Notes (Unsigned)
Primary Physician/Referring:  Javier Glazier, MD  Patient ID: Tina Webb, female    DOB: 1934/07/07, 89 y.o.   MRN: [id number]  Chief Complaint  Patient presents with   Atrial Fibrillation   LBBB   Follow-up    3 month   HPI:    Tina Webb  is a 89 y.o. history of HOCM, atrial fibrillation, LBBB, prolonged QT on EKG, sinus bradycardia, esophageal dysphagia, hypothyroid, multiple sclerosis, hyperlipidemia,  syncopal episode, with monitoring found to have persistent atrial fibrillation RVR   She has been on amiodarone and had cardioversion on 05/08/2022 and has been maintaining sinus rhythm.  Previously being followed at [facility] cardiology.  She wanted to establish with [place].  Her husband Tina Webb is a patient of mine.  Presently no specific complaints, states that she is doing well from cardiac standpoint. She is concerned that she was told that she need a defibrillator and also septal ablation for HOCM.   Past Medical History:  Diagnosis Date   Anxiety    High cholesterol    Multiple sclerosis (HCC)    Paroxysmal A-fib (HCC)    Rotator cuff tear    left   Past Surgical History:  Procedure Laterality Date   BLADDER SURGERY  2018   STEM placement    ULNAR NERVE REPAIR Right 2010   Family History  Problem Relation Age of Onset   Heart attack Mother    Transient ischemic attack Mother    Arthritis Mother    Heart Problems Father     Social History   Tobacco Use   Smoking status: Never   Smokeless tobacco: Never  Substance Use Topics   Alcohol use: Yes    Comment: 2 drinks per week   Marital Status: Married  ROS  Review of Systems  Cardiovascular:  Negative for chest pain, dyspnea on exertion and leg swelling.   Objective      09/10/2022    2:20 PM 08/23/2022   11:20 AM 08/23/2022    9:33 AM  Vitals with BMI  Height '5\' 2"'$     Weight 115 lbs    BMI [id number]    Systolic 96 [id number]  Diastolic 42 71 63  Pulse 47 62 60   SpO2: 92 %    Physical Exam Constitutional:      Comments: She is wheelchair-bound, has significant scoliosis.  Neck:     Vascular: No carotid bruit or JVD.  Cardiovascular:     Rate and Rhythm: Normal rate and regular rhythm.     Pulses:          Posterior tibial pulses are 1+ on the right side and 1+ on the left side.     Heart sounds: Normal heart sounds. No murmur heard.    No gallop.  Pulmonary:     Effort: Pulmonary effort is normal.     Breath sounds: Normal breath sounds.  Abdominal:     General: Bowel sounds are normal.     Palpations: Abdomen is soft.  Musculoskeletal:     Right lower leg: Edema (2+ below-knee pitting) present.     Left lower leg: Edema (2+ below-knee pitting) present.    Medications and allergies  No Known Allergies   Medication list   Current Outpatient Medications:    amiodarone (PACERONE) 200 MG tablet, Take 200 mg by mouth daily., Disp: , Rfl:    AMPYRA 10 MG TB12, Take 1 tablet (10 mg total) by mouth in the morning  and at bedtime. BRAND NAME AMPYRA MEDICALLY NECESSARY, Disp: 60 tablet, Rfl: 2   apixaban (ELIQUIS) 2.5 MG TABS tablet, Take 1 tablet (2.5 mg total) by mouth 2 (two) times daily., Disp: 60 tablet, Rfl: 0   baclofen (LIORESAL) 10 MG tablet, Take 0.5 tablets (5 mg total) by mouth 2 (two) times daily., Disp: 90 each, Rfl: 4   clobetasol cream (TEMOVATE) [id number] %, Apply 1 application topically as needed., Disp: , Rfl:    Cranberry-Vitamin C-Probiotic (AZO CRANBERRY PO), Take 1 tablet by mouth daily., Disp: , Rfl:    diclofenac Sodium (VOLTAREN) 1 % GEL, Apply 2 g topically as needed (joint pain)., Disp: , Rfl:    estradiol (ESTRING) 2 MG vaginal ring, Place 2 mg vaginally every 3 (three) months. follow package directions, Disp: , Rfl:    gabapentin (NEURONTIN) 300 MG capsule, Take 300 mg by mouth 3 (three) times daily., Disp: , Rfl:    hydrocortisone 2.5 % cream, Apply 1 application topically as needed., Disp: , Rfl:    hydrOXYzine (ATARAX) 10 MG tablet,  Take 10 mg by mouth at bedtime., Disp: , Rfl:    levothyroxine (SYNTHROID) 25 MCG tablet, Take 25 mcg by mouth daily before breakfast., Disp: , Rfl:    liothyronine (CYTOMEL) 5 MCG tablet, Take 5 mcg by mouth in the morning and at bedtime., Disp: , Rfl:    methenamine (HIPREX) 1 g tablet, Take 1 g by mouth daily., Disp: , Rfl:    mupirocin cream (BACTROBAN) 2 %, Apply 1 application topically as needed., Disp: , Rfl:    nitrofurantoin (MACRODANTIN) 100 MG capsule, Take 50 mg by mouth at bedtime., Disp: , Rfl:    pantoprazole (PROTONIX) 40 MG tablet, Take 40 mg by mouth daily., Disp: , Rfl:    polyethylene glycol (MIRALAX / GLYCOLAX) 17 g packet, Take 17 g by mouth daily., Disp: , Rfl:    Probiotic Product (PROBIOTIC PO), Take 2 tablets by mouth daily., Disp: , Rfl:    propranolol ER (INDERAL LA) 120 MG 24 hr capsule, Take 1 capsule (120 mg total) by mouth daily., Disp: 90 capsule, Rfl: 3   PROTOPIC 0.1 % ointment, Apply topically 2 (two) times daily. Tacrolimus, Disp: , Rfl:    rosuvastatin (CRESTOR) 20 MG tablet, Take 20 mg by mouth daily., Disp: , Rfl:    solifenacin (VESICARE) 5 MG tablet, Take 1 tablet by mouth daily., Disp: , Rfl:    triamcinolone cream (KENALOG) 0.1 %, Apply 1 application topically as needed., Disp: , Rfl:    VITAMIN D PO, Take 4,000 Units by mouth daily., Disp: , Rfl:    zolpidem (AMBIEN) 10 MG tablet, Take 10 mg by mouth at bedtime., Disp: , Rfl:  Laboratory examination:   Recent Labs    02/25/22 1020  NA 140  K 4.2  CL 111  CO2 22  GLUCOSE 113*  BUN 35*  CREATININE 0.68  CALCIUM 9.1  GFRNONAA >60    Lab Results  Component Value Date   GLUCOSE 113 (H) 02/25/2022   NA 140 02/25/2022   K 4.2 02/25/2022   CL 111 02/25/2022   CO2 22 02/25/2022   BUN 35 (H) 02/25/2022   CREATININE 0.68 02/25/2022   GFRNONAA >60 02/25/2022   CALCIUM 9.1 02/25/2022   PROT 5.7 (L) 02/25/2022   ALBUMIN 3.5 02/25/2022   BILITOT 0.5 02/25/2022   ALKPHOS 44 02/25/2022   AST  20 02/25/2022   ALT 21 02/25/2022   ANIONGAP 7 02/25/2022  Lab Results  Component Value Date   ALT 21 02/25/2022   AST 20 02/25/2022   ALKPHOS 44 02/25/2022   BILITOT 0.5 02/25/2022       Latest Ref Rng & Units 02/25/2022   10:20 AM  Hepatic Function  Total Protein 6.5 - 8.1 g/dL 5.7   Albumin 3.5 - 5.0 g/dL 3.5   AST 15 - 41 U/L 20   ALT 0 - 44 U/L 21   Alk Phosphatase 38 - 126 U/L 44   Total Bilirubin 0.3 - 1.2 mg/dL 0.5    Lipid Panel No results for input(s): "CHOL", "TRIG", "LDLCALC", "VLDL", "HDL", "CHOLHDL", "LDLDIRECT" in the last 8760 hours.  HEMOGLOBIN A1C No results found for: "HGBA1C", "MPG" TSH No results for input(s): "TSH" in the last 8760 hours.  External labs:   Labs 05/23/2022:  Total cholesterol 174, triglycerides 99, HDL 57, LDL 98.  Non-HDL cholesterol 117.  Serum glucose 86 mg, BUN 41, creatinine 0.72, EGFR 57 mL.  Potassium 4.6.  LFTs normal.  TSH 4.15.  Hb 15.6/HCT 46.6, platelets 209, normal indicis.  Labs 03/07/2022:  Potassium 4.8 3.5 - 5.3 MMOL/L  Chloride 110 98 - 110 MMOL/L  CO2 24 23 - 30 MMOL/L  BUN 27 (H) 8 - 24 MG/DL  Glucose 108 (H) 70 - 99 MG/DL  Creatinine 0.60 0.50 - 1.50 MG/DL  Calcium 8.4 (L) 8.5 - 10.5 MG/DL  Anion Gap 5 4 - 14 MMOL/L  Est. GFR 88 >=60 ML/MIN/1.73 M*2  Magnesium 2.2 1.8 - 2.4 MG/DL   TSH 03/07/2022 0.45 - 5.33 UIU/ML 4.02    Radiology:    Cardiac Studies:   Extended outpatient EKG monitoring 14 days starting 05/17/2022: Predominant rhythm is sinus rhythm with IVCD.  Minimum heart rate 50 bpm at 4 AM and maximum of 86 bpm at 6 AM.  There were 13 brief atrial tachycardia episodes, longest 11 beats. No atrial fibrillation. Occasional PVCs, ventricular couplets and triplets noted, rare. No heart block, 1 patient activated event correlated with PAC.  Echocardiogram 09/04/2022: The left ventricular cavity is small. Severe asymmetric left ventricular septal hypertrophy consistent with  hypertrophic obstructive cardiomyopathy with normal wall motion and systolic function. LV ejection fraction 60-65%.  There is systolic anterior motion of the mitral valve with severe resting LVOT gradient of 91 mm Hg. Doppler gradients after Valsalva appear reduced, I suspect due to sverity of obstruction with limited outflow. Mildly reduced LV Global L Strain =-16.7%. LV longitudinal strain imaging showed segmental reduction pattern. There is mild, Grade I diastolic dysfunction, with indeterminate left atrial pressure. LAP indeterminate ue to HCM and reduced annular e  values on that basis.  The right ventricle is normal in size and function.  The left atrium is severely dilated.  There is mild aortic regurgitation.  There is severe mitral regurgitation due to [facility].  There was insufficient TR detected to calculate RV systolic pressure.  IVC size was normal.  Compared to 12/19/2020, severe mitral regurgitation is new, previously mild.  LVOT gradient was 64 mmHg previously.   EKG:   EKG 09/10/2022: Normal sinus rhythm at the rate of 47 bpm, left atrial enlargement, left bundle branch block.  No further analysis.  Compared to 06/11/2022, no significant change.  Previous heart rate was 56 bpm.  05/15/2022 sinus bradycardia rate of 50 with LBBB QT prolongation QT 582   Assessment     ICD-10-CM   1. Paroxysmal atrial fibrillation (HCC)  I48.0 EKG 12-Lead    2. HOCM (hypertrophic  obstructive cardiomyopathy) ([place])  I42.1     3. LBBB (left bundle branch block)  I44.7     4. DNR (do not resuscitate)  Z66        Orders Placed This Encounter  Procedures   EKG 12-Lead    No orders of the defined types were placed in this encounter.   There are no discontinued medications.    Recommendations:   Tina Webb is a 89 y.o. history of HOCM, atrial fibrillation, LBBB, prolonged QT on EKG, sinus bradycardia, esophageal dysphagia, hypothyroid, multiple sclerosis, hyperlipidemia,  syncopal  episode, with monitoring found to have persistent atrial fibrillation RVR.  Patient states that she was asymptomatic when she was in persistent atrial fibrillation, syncope occurred while she was in her wheelchair, she fell without any premonitory symptoms.  She is presently wheelchair-bound due to multiple sclerosis.  She has been on amiodarone and had cardioversion on 05/08/2022 and has been maintaining sinus rhythm.  Previously being followed at [facility] cardiology.  This is a 3 month OV.  I was evaluating her for syncope and felt her symptoms were related to brief episode of AF and hypotension related to HOCM prior to starting amiodarone.  1. Paroxysmal atrial fibrillation ([place]) Patient is presently maintaining sinus rhythm on 200 mg of amiodarone daily, continue the same.  I would like to have continued bradycardia in view of severe LVOT obstruction due to HOCM.  She remains asymptomatic.  She has not had any recurrence of syncope.   2. HOCM (hypertrophic obstructive cardiomyopathy) ([place]) She remains asymptomatic without clinical evidence of heart failure.  She does have chronic lower extremity edema related to probably her posture and she is wheelchair-bound due to multiple sclerosis.  Also recently she has had trauma as her wheelchair ran into the wall, she now has open wound and Hemovac left leg.  Do not suspect decompensated heart failure.  3. LBBB (left bundle branch block) I reviewed her Zio patch monitoring, she has not had any sinus pauses, no significant bradycardia, no recurrence of atrial fibrillation.  In view of advanced age I performed event monitoring as she is on amiodarone and low heart rate is noted.  Continue present medical therapy.  4. DNR (do not resuscitate) Patient does not want to be full code, she is DNR.  Her husband is present at the bedside.  He will bring the ACP documents for our upload.  Otherwise stable from cardiac standpoint, I will see her back in 6  months.     Tina Prows, MD, [facility] 09/10/2022, 3:09 PM Office: [phone]

## 2022-09-11 ENCOUNTER — Encounter: Payer: Self-pay | Admitting: Cardiology

## 2022-10-02 ENCOUNTER — Other Ambulatory Visit: Payer: Self-pay

## 2022-10-02 ENCOUNTER — Other Ambulatory Visit: Payer: Self-pay | Admitting: *Deleted

## 2022-10-02 ENCOUNTER — Ambulatory Visit (INDEPENDENT_AMBULATORY_CARE_PROVIDER_SITE_OTHER): Payer: Medicare Other | Admitting: Orthopedic Surgery

## 2022-10-02 DIAGNOSIS — G35 Multiple sclerosis: Secondary | ICD-10-CM

## 2022-10-02 DIAGNOSIS — R269 Unspecified abnormalities of gait and mobility: Secondary | ICD-10-CM

## 2022-10-02 DIAGNOSIS — M19012 Primary osteoarthritis, left shoulder: Secondary | ICD-10-CM

## 2022-10-02 MED ORDER — AMPYRA 10 MG PO TB12: 10.0000 mg | ORAL_TABLET | Freq: Two times a day (BID) | ORAL | 10 refills | Status: DC

## 2022-10-05 MED ORDER — LIDOCAINE HCL 1 % IJ SOLN
5.0000 mL | INTRAMUSCULAR | Status: AC | PRN
  Administered 2022-10-02: 5 mL

## 2022-10-05 MED ORDER — METHYLPREDNISOLONE ACETATE 40 MG/ML IJ SUSP
40.0000 mg | INTRAMUSCULAR | Status: AC | PRN
  Administered 2022-10-02: 40 mg via INTRA_ARTICULAR

## 2022-10-05 MED ORDER — BUPIVACAINE HCL 0.5 % IJ SOLN
9.0000 mL | INTRAMUSCULAR | Status: AC | PRN
  Administered 2022-10-02: 9 mL via INTRA_ARTICULAR

## 2022-10-05 NOTE — Progress Notes (Signed)
Office Visit Note   Patient: Tina Webb           Date of Birth: 01-28-1934           MRN: [id number] Visit Date: 10/02/2022 Requested by: Javier Glazier, MD [address] [place],  [place] [address] PCP: Javier Glazier, MD  Subjective: Chief Complaint  Patient presents with   Left Shoulder - Pain    HPI: Tina Webb is a 89 y.o. female who presents to the office reporting left shoulder pain.  She has had prior aspiration and injection of that shoulder 823 with good results.  She is in a wheelchair.  Has MS.  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  She reports some continued C-spine type pain but had an epidural steroid injection in [place] which did not help.  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  She has been using a Physiological scientist who has been moving her shoulder some but that tends to be making it more sore.  She does have a very complicated shoulder which she is not reconstructed well due to glenoid bone loss.  That has given her some degree of anterior and posterior instability at times..                ROS: All systems reviewed are negative as they relate to the chief complaint within the history of present illness.  Patient denies fevers or chills.  Assessment & Plan: Visit Diagnoses:  1. Arthritis of left shoulder region     Plan: Impression is left shoulder glenohumeral joint arthritis with minimal effusion today.  Under ultrasound guidance injection of the glenohumeral joint is performed.  Plan to see her back as needed.  In terms of therapy/trainer initiated range of motion exercises I think it may be better to walk the fine-line between keeping motion in the shoulder versus aggravating the shoulder with too much activity.  Follow-Up Instructions: No follow-ups on file.   Orders:  Orders Placed This Encounter  Procedures   US Guided Needle Placement - No Linked Charges    No orders of the defined types were placed in this encounter.     Procedures: Large Joint Inj: L glenohumeral on 10/02/2022 1:55 PM Indications: diagnostic evaluation and pain Details: 18 G 1.5 in needle, ultrasound-guided posterior approach  Arthrogram: No  Medications: 9 mL bupivacaine 0.5 %; 40 mg methylPREDNISolone acetate 40 MG/ML; 5 mL lidocaine 1 % Outcome: tolerated well, no immediate complications Procedure, treatment alternatives, risks and benefits explained, specific risks discussed. Consent was given by the patient. Immediately prior to procedure a time out was called to verify the correct patient, procedure, equipment, support staff and site/side marked as required. Patient was prepped and draped in the usual sterile fashion.       Clinical Data: No additional findings.  Objective: Vital Signs: There were no vitals taken for this visit.  Physical Exam:  Constitutional: Patient appears well-developed HEENT:  Head: Normocephalic Eyes:EOM are normal Neck: Normal range of motion Cardiovascular: Normal rate Pulmonary/chest: Effort normal Neurologic: Patient is alert Skin: Skin is warm Psychiatric: Patient has normal mood and affect  Ortho Exam: Ortho exam demonstrates functional deltoid on the left with nonpalpable effusion in the shoulder joint.  This is different from her exam in August.  She does have some coarseness and grinding with passive range of motion of the shoulder.  No discrete AC joint  tenderness.  Patient has palpable radial pulse on that side with intact EPL FPL interosseous function  Specialty Comments:  No specialty comments available.  Imaging: No results found.   PMFS History: Patient Active Problem List   Diagnosis Date Noted   Essential tremor 08/23/2021   Primary osteoarthritis of left shoulder 10/05/2020   Chronic left shoulder pain 08/10/2020   Chronic night sweats 08/09/2020   Multiple sclerosis ([facility]) 11/02/2019   Gait  disorder 11/02/2019   Weakness 11/02/2019   Urge incontinence of urine 11/02/2019   Foot-drop 10/06/2019   Senile osteoporosis 10/06/2019   Contracted bladder 06/17/2016   Injury of tendon of rotator cuff 04/25/2016   Neurogenic bladder 04/25/2016   Hypothyroidism 08/16/2010   Hypertrophic cardiomyopathy ([facility]) 07/08/2010   Vitamin D deficiency 11/13/2009   Gastroesophageal reflux disease 08/14/2009   Past Medical History:  Diagnosis Date   Anxiety    High cholesterol    Multiple sclerosis (HCC)    Paroxysmal A-fib (HCC)    Rotator cuff tear    left    Family History  Problem Relation Age of Onset   Heart attack Mother    Transient ischemic attack Mother    Arthritis Mother    Heart Problems Father     Past Surgical History:  Procedure Laterality Date   BLADDER SURGERY  2018   STEM placement    ULNAR NERVE REPAIR Right 2010   Social History   Occupational History   Occupation: Retired  Tobacco Use   Smoking status: Never   Smokeless tobacco: Never  Vaping Use   Vaping Use: Never used  Substance and Sexual Activity   Alcohol use: Yes    Comment: 2 drinks per week   Drug use: Never   Sexual activity: Not Currently

## 2022-10-07 ENCOUNTER — Ambulatory Visit: Payer: Medicare Other | Admitting: Neurology

## 2022-10-08 ENCOUNTER — Other Ambulatory Visit: Payer: Self-pay | Admitting: Neurology

## 2022-11-18 ENCOUNTER — Encounter: Payer: Self-pay | Admitting: Cardiology

## 2022-11-18 NOTE — Telephone Encounter (Signed)
From patient.

## 2022-11-19 NOTE — Telephone Encounter (Signed)
From patient.

## 2022-11-20 ENCOUNTER — Encounter: Payer: Self-pay | Admitting: Cardiology

## 2022-11-20 ENCOUNTER — Ambulatory Visit: Payer: Medicare Other | Admitting: Cardiology

## 2022-11-20 VITALS — BP 119/41 | HR 47 | Resp 16 | Ht 62.0 in | Wt 115.0 lb

## 2022-11-20 DIAGNOSIS — I421 Obstructive hypertrophic cardiomyopathy: Secondary | ICD-10-CM

## 2022-11-20 DIAGNOSIS — I447 Left bundle-branch block, unspecified: Secondary | ICD-10-CM

## 2022-11-20 DIAGNOSIS — Z66 Do not resuscitate: Secondary | ICD-10-CM

## 2022-11-20 DIAGNOSIS — I480 Paroxysmal atrial fibrillation: Secondary | ICD-10-CM

## 2022-11-20 MED ORDER — AMIODARONE HCL 200 MG PO TABS: 100.0000 mg | ORAL_TABLET | Freq: Every day | ORAL | Status: DC

## 2022-11-20 NOTE — Patient Instructions (Signed)
Is note that your amiodarone dose has been changed from 200 mg daily to 100 mg daily.  In 1 month from now, if the heart rate is still <50 to 55 bpm, you could switch taking the amiodarone to 100 mg every other day.  I will be seeing you back here in September.  I put that in and noticed to still be able to

## 2022-11-20 NOTE — Progress Notes (Signed)
Primary Physician/Referring:  Nadara Eaton, MD  Patient ID: Tina Webb, female    DOB: Jun 26, 1934, 89 y.o.   MRN: [id number]  Chief Complaint  Patient presents with   Bradycardia   HPI:    Tina Webb  is a 89 y.o.  history of HOCM, atrial fibrillation, LBBB, prolonged QT on EKG, chronic sinus bradycardia and on long-term amiodarone and Inderal LA in view of HOCM to induce bradycardia, esophageal dysphagia, hypothyroid, multiple sclerosis, hyperlipidemia,  syncopal episode, with monitoring found to have persistent atrial fibrillation RVR.  She is presently wheelchair-bound due to multiple sclerosis.  She has been on amiodarone and had cardioversion on 05/08/2022 and has been maintaining sinus rhythm.  Her husband made this appointment stating that heart rate has been around 45 bpm, wanted to be seen.  No specific complaints per se otherwise and states that she is doing well from cardiac standpoint.     Past Medical History:  Diagnosis Date   Anxiety    High cholesterol    Multiple sclerosis (HCC)    Paroxysmal A-fib (HCC)    Rotator cuff tear    left   Past Surgical History:  Procedure Laterality Date   BLADDER SURGERY  2018   STEM placement    ULNAR NERVE REPAIR Right 2010   Family History  Problem Relation Age of Onset   Heart attack Mother    Transient ischemic attack Mother    Arthritis Mother    Heart Problems Father     Social History   Tobacco Use   Smoking status: Never   Smokeless tobacco: Never  Substance Use Topics   Alcohol use: Yes    Comment: 2 drinks per week   Marital Status: Married  ROS  Review of Systems  Cardiovascular:  Negative for chest pain, dyspnea on exertion and leg swelling.   Objective      11/20/2022    2:51 PM 11/20/2022    2:44 PM 09/10/2022    2:20 PM  Vitals with BMI  Height 5\' 2"   5\' 2"   Weight 115 lbs  115 lbs  BMI 21.03  21.03  Systolic  119 96  Diastolic  41 42  Pulse  47 47   SpO2: 95 %    Physical Exam Constitutional:      Comments: She is wheelchair-bound, has significant scoliosis.  Neck:     Vascular: No carotid bruit or JVD.  Cardiovascular:     Rate and Rhythm: Regular rhythm. Bradycardia present.     Pulses:          Posterior tibial pulses are 1+ on the right side and 1+ on the left side.     Heart sounds: Murmur heard.     Holosystolic murmur is present at the upper right sternal border and apex.     No gallop.  Pulmonary:     Effort: Pulmonary effort is normal.     Breath sounds: Normal breath sounds.  Abdominal:     General: Bowel sounds are normal.     Palpations: Abdomen is soft.  Musculoskeletal:     Right lower leg: Edema (trace) present.     Left lower leg: Edema (Trace) present.    Medications and allergies  No Known Allergies   Medication list   Current Outpatient Medications:    AMPYRA 10 MG TB12, Take 1 tablet (10 mg total) by mouth in the morning and at bedtime. BRAND NAME AMPYRA MEDICALLY NECESSARY, Disp: 60 tablet, Rfl:  10   apixaban (ELIQUIS) 2.5 MG TABS tablet, Take 1 tablet (2.5 mg total) by mouth 2 (two) times daily., Disp: 60 tablet, Rfl: 0   baclofen (LIORESAL) 10 MG tablet, TAKE 1/2 TABLET TWICE DAILY, Disp: 90 tablet, Rfl: 2   clobetasol cream (TEMOVATE) 0.05 %, Apply 1 application topically as needed., Disp: , Rfl:    Cranberry-Vitamin C-Probiotic (AZO CRANBERRY PO), Take 1 tablet by mouth daily., Disp: , Rfl:    diclofenac Sodium (VOLTAREN) 1 % GEL, Apply 2 g topically as needed (joint pain)., Disp: , Rfl:    estradiol (ESTRING) 2 MG vaginal ring, Place 2 mg vaginally every 3 (three) months. follow package directions, Disp: , Rfl:    gabapentin (NEURONTIN) 300 MG capsule, Take 300 mg by mouth 3 (three) times daily., Disp: , Rfl:    hydrocortisone 2.5 % cream, Apply 1 application topically as needed., Disp: , Rfl:    levothyroxine (SYNTHROID) 25 MCG tablet, Take 25 mcg by mouth daily before breakfast., Disp: , Rfl:    liothyronine  (CYTOMEL) 5 MCG tablet, Take 5 mcg by mouth in the morning and at bedtime., Disp: , Rfl:    methenamine (HIPREX) 1 g tablet, Take 1 g by mouth daily., Disp: , Rfl:    mupirocin cream (BACTROBAN) 2 %, Apply 1 application topically as needed., Disp: , Rfl:    nitrofurantoin (MACRODANTIN) 100 MG capsule, Take 50 mg by mouth at bedtime., Disp: , Rfl:    pantoprazole (PROTONIX) 40 MG tablet, Take 40 mg by mouth daily., Disp: , Rfl:    polyethylene glycol (MIRALAX / GLYCOLAX) 17 g packet, Take 17 g by mouth daily., Disp: , Rfl:    Probiotic Product (PROBIOTIC PO), Take 2 tablets by mouth daily., Disp: , Rfl:    propranolol ER (INDERAL LA) 120 MG 24 hr capsule, Take 1 capsule (120 mg total) by mouth daily., Disp: 90 capsule, Rfl: 3   rosuvastatin (CRESTOR) 20 MG tablet, Take 20 mg by mouth daily., Disp: , Rfl:    solifenacin (VESICARE) 5 MG tablet, Take 1 tablet by mouth daily., Disp: , Rfl:    triamcinolone cream (KENALOG) 0.1 %, Apply 1 application topically as needed., Disp: , Rfl:    VITAMIN D PO, Take 4,000 Units by mouth daily., Disp: , Rfl:    zolpidem (AMBIEN) 5 MG tablet, Take 5 mg by mouth at bedtime., Disp: , Rfl:    amiodarone (PACERONE) 200 MG tablet, Take 0.5 tablets (100 mg total) by mouth daily., Disp: , Rfl:  Laboratory examination:   Recent Labs    02/25/22 1020  NA 140  K 4.2  CL 111  CO2 22  GLUCOSE 113*  BUN 35*  CREATININE 0.68  CALCIUM 9.1  GFRNONAA >60    Lab Results  Component Value Date   GLUCOSE 113 (H) 02/25/2022   NA 140 02/25/2022   K 4.2 02/25/2022   CL 111 02/25/2022   CO2 22 02/25/2022   BUN 35 (H) 02/25/2022   CREATININE 0.68 02/25/2022   GFRNONAA >60 02/25/2022   CALCIUM 9.1 02/25/2022   PROT 5.7 (L) 02/25/2022   ALBUMIN 3.5 02/25/2022   BILITOT 0.5 02/25/2022   ALKPHOS 44 02/25/2022   AST 20 02/25/2022   ALT 21 02/25/2022   ANIONGAP 7 02/25/2022      Lab Results  Component Value Date   ALT 21 02/25/2022   AST 20 02/25/2022   ALKPHOS  44 02/25/2022   BILITOT 0.5 02/25/2022  Latest Ref Rng & Units 02/25/2022   10:20 AM  Hepatic Function  Total Protein 6.5 - 8.1 g/dL 5.7   Albumin 3.5 - 5.0 g/dL 3.5   AST 15 - 41 U/L 20   ALT 0 - 44 U/L 21   Alk Phosphatase 38 - 126 U/L 44   Total Bilirubin 0.3 - 1.2 mg/dL 0.5    External labs:   Labs 05/23/2022:  Total cholesterol 174, triglycerides 99, HDL 57, LDL 98.  Non-HDL cholesterol 117.  Serum glucose 86 mg, BUN 41, creatinine 0.72, EGFR 57 mL.  Potassium 4.6.  LFTs normal.  TSH 4.15.  Hb 15.6/HCT 46.6, platelets 209, normal indicis.  Labs 03/07/2022:  Potassium 4.8 3.5 - 5.3 MMOL/L  Chloride 110 98 - 110 MMOL/L  CO2 24 23 - 30 MMOL/L  BUN 27 (H) 8 - 24 MG/DL  Glucose 161 (H) 70 - 99 MG/DL  Creatinine 0.96 0.45 - 1.50 MG/DL  Calcium 8.4 (L) 8.5 - 10.5 MG/DL  Anion Gap 5 4 - 14 MMOL/L  Est. GFR 88 >=60 ML/MIN/1.73 M*2   Magnesium 2.2 1.8 - 2.4 MG/DL   TSH 10/14/8117 1.47 - 5.33 UIU/ML 4.02   Radiology:    Cardiac Studies:   Extended outpatient EKG monitoring 14 days starting 05/17/2022: Predominant rhythm is sinus rhythm with IVCD.  Minimum heart rate 50 bpm at 4 AM and maximum of 86 bpm at 6 AM.  There were 13 brief atrial tachycardia episodes, longest 11 beats. No atrial fibrillation. Occasional PVCs, ventricular couplets and triplets noted, rare. No heart block, 1 patient activated event correlated with PAC.  Echocardiogram 09/04/2022: The left ventricular cavity is small. Severe asymmetric left ventricular septal hypertrophy consistent with hypertrophic obstructive cardiomyopathy with normal wall motion and systolic function. LV ejection fraction 60-65%.  There is systolic anterior motion of the mitral valve with severe resting LVOT gradient of 91 mm Hg. Doppler gradients after Valsalva appear reduced, I suspect due to sverity of obstruction with limited outflow. Mildly reduced LV Global L Strain =-16.7%. LV longitudinal strain imaging showed  segmental reduction pattern. There is mild, Grade I diastolic dysfunction, with indeterminate left atrial pressure. LAP indeterminate ue to HCM and reduced annular e  values on that basis.  The right ventricle is normal in size and function.  The left atrium is severely dilated.  There is mild aortic regurgitation.  There is severe mitral regurgitation due to [facility].  There was insufficient TR detected to calculate RV systolic pressure.  IVC size was normal.  Compared to 12/19/2020, severe mitral regurgitation is new, previously mild.  LVOT gradient was 64 mmHg previously.   EKG:   EKG 09/10/2022: Normal sinus rhythm at the rate of 47 bpm, left atrial enlargement, left bundle branch block.  No further analysis.  Compared to 06/11/2022, no significant change.  Previous heart rate was 56 bpm.  05/15/2022 sinus bradycardia rate of 50 with LBBB QT prolongation QT 582   Assessment     ICD-10-CM   1. Paroxysmal atrial fibrillation (HCC)  I48.0 EKG 12-Lead    amiodarone (PACERONE) 200 MG tablet    2. HOCM (hypertrophic obstructive cardiomyopathy) (HCC)  I42.1 amiodarone (PACERONE) 200 MG tablet    3. LBBB (left bundle branch block)  I44.7     4. DNR (do not resuscitate)  Z66        Orders Placed This Encounter  Procedures   EKG 12-Lead    Meds ordered this encounter  Medications   amiodarone (PACERONE) 200  MG tablet    Sig: Take 0.5 tablets (100 mg total) by mouth daily.     Medications Discontinued During This Encounter  Medication Reason   PROTOPIC 0.1 % ointment    hydrOXYzine (ATARAX) 10 MG tablet    amiodarone (PACERONE) 200 MG tablet Reorder      Recommendations:   Tina Webb is a 89 y.o. history of HOCM, atrial fibrillation, LBBB, prolonged QT on EKG, chronic sinus bradycardia and on long-term amiodarone and Inderal LA in view of HOCM to induce bradycardia, esophageal dysphagia, hypothyroid, multiple sclerosis, hyperlipidemia,  syncopal episode, with monitoring  found to have persistent atrial fibrillation RVR.  She is presently wheelchair-bound due to multiple sclerosis.  1. Paroxysmal atrial fibrillation (HCC) She has been on amiodarone and had cardioversion on 05/08/2022 and has been maintaining sinus rhythm.  Although she has bradycardia, she has been asymptomatic.  She is presently 89 years of age, as she is on a relatively high dose of beta-blocker, will reduce the dose today.  Overall from 200 mg daily to 100 mg daily.  Advised patient's husband who is a retired Education officer, community that after 4 weeks, if the heart rate is not >50, he could switch amiodarone to 100 mg every other day.  She has an appointment to see me in 3 months, which she will keep.  - EKG 12-Lead - amiodarone (PACERONE) 200 MG tablet; Take 0.5 tablets (100 mg total) by mouth daily.  2. HOCM (hypertrophic obstructive cardiomyopathy) (HCC) In view of HOCM, as she is asymptomatic, continue beta-blockers along with amiodarone to keep her heart rate low.  This is kept her out of heart failure and essentially asymptomatic.  - amiodarone (PACERONE) 200 MG tablet; Take 0.5 tablets (100 mg total) by mouth daily.  3. LBBB (left bundle branch block) Patient has chronic left bundle branch block.  Obviously with that age, with patient being on 2 negative inotropic agents, she is at risk for heart block as well.  This needs to be kept in mind and she needs to be followed closely.  Fortunately over the past greater than 1 year, she has been doing well and tolerating all her medications well.   4. DNR (do not resuscitate) Patient does not want to be full code, she is DNR.  Her husband is present at the bedside.       Yates Decamp, MD, [facility] 11/20/2022, 5:52 PM Office: [phone]

## 2022-12-05 ENCOUNTER — Encounter: Payer: Self-pay | Admitting: Cardiology

## 2022-12-05 DIAGNOSIS — I421 Obstructive hypertrophic cardiomyopathy: Secondary | ICD-10-CM

## 2022-12-06 NOTE — Telephone Encounter (Signed)
From patient.

## 2022-12-09 ENCOUNTER — Encounter: Payer: Self-pay | Admitting: Cardiology

## 2022-12-09 NOTE — Telephone Encounter (Signed)
From patient.

## 2022-12-11 MED ORDER — PROPRANOLOL HCL ER 80 MG PO CP24
80.0000 mg | ORAL_CAPSULE | Freq: Every day | ORAL | 1 refills | Status: DC
Start: 2022-12-11 — End: 2023-01-02

## 2022-12-15 ENCOUNTER — Emergency Department (HOSPITAL_BASED_OUTPATIENT_CLINIC_OR_DEPARTMENT_OTHER)
Admission: EM | Admit: 2022-12-15 | Discharge: 2022-12-15 | Disposition: A | Discharge status: Home / Self Care | Payer: Medicare Other | Attending: Emergency Medicine | Admitting: Emergency Medicine

## 2022-12-15 ENCOUNTER — Encounter (HOSPITAL_BASED_OUTPATIENT_CLINIC_OR_DEPARTMENT_OTHER): Payer: Self-pay | Admitting: Emergency Medicine

## 2022-12-15 ENCOUNTER — Other Ambulatory Visit: Payer: Self-pay

## 2022-12-15 DIAGNOSIS — W44F3XA Food entering into or through a natural orifice, initial encounter: Secondary | ICD-10-CM | POA: Diagnosis not present

## 2022-12-15 DIAGNOSIS — R1310 Dysphagia, unspecified: Secondary | ICD-10-CM | POA: Diagnosis present

## 2022-12-15 DIAGNOSIS — E039 Hypothyroidism, unspecified: Secondary | ICD-10-CM | POA: Diagnosis not present

## 2022-12-15 DIAGNOSIS — Z7901 Long term (current) use of anticoagulants: Secondary | ICD-10-CM | POA: Diagnosis not present

## 2022-12-15 DIAGNOSIS — T18128A Food in esophagus causing other injury, initial encounter: Secondary | ICD-10-CM | POA: Diagnosis not present

## 2022-12-15 HISTORY — DX: Other hypertrophic cardiomyopathy: I42.2

## 2022-12-15 LAB — CBC WITH DIFFERENTIAL/PLATELET
Abs Immature Granulocytes: 0.04 10*3/uL (ref 0.00–0.07)
Basophils Absolute: 0 10*3/uL (ref 0.0–0.1)
Basophils Relative: 0 %
Eosinophils Absolute: 0.1 10*3/uL (ref 0.0–0.5)
Eosinophils Relative: 1 %
HCT: 42.3 % (ref 36.0–46.0)
Hemoglobin: 13.6 g/dL (ref 12.0–15.0)
Immature Granulocytes: 1 %
Lymphocytes Relative: 7 %
Lymphs Abs: 0.5 10*3/uL — ABNORMAL LOW (ref 0.7–4.0)
MCH: 28.9 pg (ref 26.0–34.0)
MCHC: 32.2 g/dL (ref 30.0–36.0)
MCV: 89.8 fL (ref 80.0–100.0)
Monocytes Absolute: 0.4 10*3/uL (ref 0.1–1.0)
Monocytes Relative: 6 %
Neutro Abs: 6.1 10*3/uL (ref 1.7–7.7)
Neutrophils Relative %: 85 %
Platelets: 339 10*3/uL (ref 150–400)
RBC: 4.71 MIL/uL (ref 3.87–5.11)
RDW: 17.8 % — ABNORMAL HIGH (ref 11.5–15.5)
WBC: 7.2 10*3/uL (ref 4.0–10.5)
nRBC: 0 % (ref 0.0–0.2)

## 2022-12-15 LAB — BASIC METABOLIC PANEL
Anion gap: 10 (ref 5–15)
BUN: 43 mg/dL — ABNORMAL HIGH (ref 8–23)
CO2: 24 mmol/L (ref 22–32)
Calcium: 9.1 mg/dL (ref 8.9–10.3)
Chloride: 104 mmol/L (ref 98–111)
Creatinine, Ser: 0.61 mg/dL (ref 0.44–1.00)
GFR, Estimated: >60 mL/min (ref >60)
Glucose, Bld: 133 mg/dL — ABNORMAL HIGH (ref 70–99)
Potassium: 4 mmol/L (ref 3.5–5.1)
Sodium: 138 mmol/L (ref 135–145)

## 2022-12-15 MED ORDER — GLUCAGON HCL RDNA (DIAGNOSTIC) 1 MG IJ SOLR
1.0000 mg | Freq: Once | INTRAMUSCULAR | Status: AC
  Administered 2022-12-15: 1 mg via INTRAVENOUS
  Filled 2022-12-15: qty 1

## 2022-12-15 MED ORDER — DIAZEPAM 5 MG/ML IJ SOLN
2.5000 mg | Freq: Once | INTRAMUSCULAR | Status: AC
  Administered 2022-12-15: 2.5 mg via INTRAVENOUS
  Filled 2022-12-15: qty 2

## 2022-12-15 NOTE — Discharge Instructions (Signed)
Return to the emergency department if you experience any new and/or concerning symptoms.  Follow-up with your gastroenterologist in the next 1 to 2 weeks.

## 2022-12-15 NOTE — ED Triage Notes (Signed)
Pt reports difficulty swallowing that started around 1800. She states she cannot swallow her saliva. She reports last eating an apple around 1500. Pt is able to speak but states anything she drinks comes right back up. Hx of esophageal stricture with dilation in February. Pt also reports "the worst back ache" when getting into bed tonight and states this is very unusual for her.

## 2022-12-15 NOTE — ED Provider Notes (Signed)
[facility] Provider Note   CSN: [id number] Arrival date & time: 12/15/22  0259     History  Chief Complaint  Patient presents with   Dysphagia    Tina Webb is a 89 y.o. female.  Patient is an 89 year old female with past medical history of esophageal stricture requiring dilation in February of this year, hypertrophic cardiomyopathy, GERD, hypothyroidism, multiple sclerosis.  Patient presenting today with complaints of difficulty swallowing.  She reports eating an apple at approximately 3 PM yesterday.  Since that time she is unable to swallow food or liquid.  She is even having difficulty swallowing her own saliva.  She denies any chest pain or difficulty breathing.  She denies abdominal pain.  The history is provided by the patient.       Home Medications Prior to Admission medications   Medication Sig Start Date End Date Taking? Authorizing Provider  amiodarone (PACERONE) 200 MG tablet Take 0.5 tablets (100 mg total) by mouth daily. 11/20/22   Yates Decamp, MD  AMPYRA 10 MG TB12 Take 1 tablet (10 mg total) by mouth in the morning and at bedtime. BRAND NAME AMPYRA MEDICALLY NECESSARY 10/02/22   Sater, Pearletha Furl, MD  apixaban (ELIQUIS) 2.5 MG TABS tablet Take 1 tablet (2.5 mg total) by mouth 2 (two) times daily. 02/25/22 11/20/22  Cristopher Peru, PA-C  baclofen (LIORESAL) 10 MG tablet TAKE 1/2 TABLET TWICE DAILY 10/09/22   Sater, Pearletha Furl, MD  clobetasol cream (TEMOVATE) 0.05 % Apply 1 application topically as needed.    [provider]  Cranberry-Vitamin C-Probiotic (AZO CRANBERRY PO) Take 1 tablet by mouth daily.    [provider]  diclofenac Sodium (VOLTAREN) 1 % GEL Apply 2 g topically as needed (joint pain).    [provider]  estradiol (ESTRING) 2 MG vaginal ring Place 2 mg vaginally every 3 (three) months. follow package directions    [provider]  gabapentin (NEURONTIN) 300 MG capsule  Take 300 mg by mouth 3 (three) times daily.    [provider]  hydrocortisone 2.5 % cream Apply 1 application topically as needed.    [provider]  levothyroxine (SYNTHROID) 25 MCG tablet Take 25 mcg by mouth daily before breakfast.    [provider]  liothyronine (CYTOMEL) 5 MCG tablet Take 5 mcg by mouth in the morning and at bedtime.    [provider]  methenamine (HIPREX) 1 g tablet Take 1 g by mouth daily.    [provider]  mupirocin cream (BACTROBAN) 2 % Apply 1 application topically as needed.    [provider]  nitrofurantoin (MACRODANTIN) 100 MG capsule Take 50 mg by mouth at bedtime.    [provider]  pantoprazole (PROTONIX) 40 MG tablet Take 40 mg by mouth daily. 04/01/22   [provider]  polyethylene glycol (MIRALAX / GLYCOLAX) 17 g packet Take 17 g by mouth daily.    [provider]  Probiotic Product (PROBIOTIC PO) Take 2 tablets by mouth daily.    [provider]  propranolol ER (INDERAL LA) 80 MG 24 hr capsule Take 1 capsule (80 mg total) by mouth daily. 12/11/22   Yates Decamp, MD  rosuvastatin (CRESTOR) 20 MG tablet Take 20 mg by mouth daily.    [provider]  solifenacin (VESICARE) 5 MG tablet Take 1 tablet by mouth daily. 02/21/22   [provider]  triamcinolone cream (KENALOG) 0.1 % Apply 1 application  topically as needed.    [provider]  VITAMIN D PO Take 4,000 Units by mouth daily.    [provider]  zolpidem (AMBIEN) 5 MG tablet Take 5 mg by mouth at bedtime.    [provider]      Allergies    Patient has no known allergies.    Review of Systems   Review of Systems  All other systems reviewed and are negative.   Physical Exam Updated Vital Signs BP (!) 92/52 Comment: pt and spouse report this is baseline  Pulse (!) 50   Temp 97.6 F (36.4 C) (Oral)   Resp 20   Ht 5\' 2"  (1.575 m)   Wt 54.4 kg   SpO2 93%   BMI  21.95 kg/m  Physical Exam Vitals and nursing note reviewed.  Constitutional:      General: She is not in acute distress.    Appearance: She is well-developed. She is not diaphoretic.  HENT:     Head: Normocephalic and atraumatic.  Cardiovascular:     Rate and Rhythm: Normal rate and regular rhythm.     Heart sounds: No murmur heard.    No friction rub. No gallop.  Pulmonary:     Effort: Pulmonary effort is normal. No respiratory distress.     Breath sounds: Normal breath sounds. No wheezing.  Abdominal:     General: Bowel sounds are normal. There is no distension.     Palpations: Abdomen is soft.     Tenderness: There is no abdominal tenderness.  Musculoskeletal:        General: Normal range of motion.     Cervical back: Normal range of motion and neck supple.  Skin:    General: Skin is warm and dry.  Neurological:     General: No focal deficit present.     Mental Status: She is alert and oriented to person, place, and time.     ED Results / Procedures / Treatments   Labs (all labs ordered are listed, but only abnormal results are displayed) Labs Reviewed  BASIC METABOLIC PANEL  CBC WITH DIFFERENTIAL/PLATELET    EKG None  Radiology No results found.  Procedures Procedures    Medications Ordered in ED Medications  glucagon (human recombinant) (GLUCAGEN) injection 1 mg (has no administration in time range)  diazepam (VALIUM) injection 2.5 mg (has no administration in time range)    ED Course/ Medical Decision Making/ A&P  Patient presenting here accompanied by her husband for evaluation of difficulty swallowing.  She ate an apple yesterday at 3 PM and has been unable to swallow her own saliva, liquids, or solids since.  Patient arrives here with stable vital signs and is clinically well-appearing.  Abdomen is benign.  CBC and metabolic panel obtained, both of which are basically unremarkable.  Patient was given IV glucagon along with IV Valium, then given a  p.o. challenge with carbonated soda.  Patient was able to consume the entire cup of soda without regurgitation or difficulty.  It appears as though the food bolus has passed.  Patient seems appropriate for discharge with outpatient follow-up.  Final Clinical Impression(s) / ED Diagnoses Final diagnoses:  None    Rx / DC Orders ED Discharge Orders     None         Geoffery Lyons, MD 12/15/22 [phone]

## 2022-12-24 ENCOUNTER — Ambulatory Visit (INDEPENDENT_AMBULATORY_CARE_PROVIDER_SITE_OTHER): Payer: Medicare Other | Admitting: Neurology

## 2022-12-24 ENCOUNTER — Telehealth: Payer: Self-pay | Admitting: Neurology

## 2022-12-24 ENCOUNTER — Encounter: Payer: Self-pay | Admitting: Neurology

## 2022-12-24 ENCOUNTER — Ambulatory Visit (HOSPITAL_BASED_OUTPATIENT_CLINIC_OR_DEPARTMENT_OTHER)
Admission: RE | Admit: 2022-12-24 | Discharge: 2022-12-24 | Disposition: A | Discharge status: Home / Self Care | Payer: Medicare Other | Source: Ambulatory Visit | Attending: Neurology | Admitting: Neurology

## 2022-12-24 VITALS — BP 107/60 | HR 81 | Ht 62.0 in

## 2022-12-24 DIAGNOSIS — R0602 Shortness of breath: Secondary | ICD-10-CM | POA: Insufficient documentation

## 2022-12-24 DIAGNOSIS — R269 Unspecified abnormalities of gait and mobility: Secondary | ICD-10-CM | POA: Diagnosis not present

## 2022-12-24 DIAGNOSIS — G35 Multiple sclerosis: Secondary | ICD-10-CM

## 2022-12-24 DIAGNOSIS — Z993 Dependence on wheelchair: Secondary | ICD-10-CM

## 2022-12-24 DIAGNOSIS — G250 Essential tremor: Secondary | ICD-10-CM

## 2022-12-24 DIAGNOSIS — G801 Spastic diplegic cerebral palsy: Secondary | ICD-10-CM | POA: Diagnosis not present

## 2022-12-24 DIAGNOSIS — R42 Dizziness and giddiness: Secondary | ICD-10-CM

## 2022-12-24 NOTE — Progress Notes (Signed)
[facility]  PATIENT: Tina Webb DOB: 09/03/1933  REFERRING DOCTOR OR PCP:  Tina Webb (PCP); Tina Webb (neurology) SOURCE: Patient, notes from Dr. Excell Webb, imaging reports, MRI images personally reviewed.  _________________________________   HISTORICAL  CHIEF COMPLAINT:  Chief Complaint  Patient presents with   Follow-up    Pt in room 11, husband in room. Here for MS follow up. Pt reports she is getting weaker, no recent falls. Husband reports rash all over 1 year reports [facility] is monitoring rash.     HISTORY OF PRESENT ILLNESS:  Tina Webb is a 89 y.o. woman with multiple sclerosis diagnosed in 1984.  Update 12/24/2022 Mobility Exam: She is here today for a mobility evaluation.   Due to leg weakness and gait ataxia she is unable to walk.  She needs an electric wheelchair for her ADL needs requiring movement from room to room (bathroom, washing, simple meal prep, simple chores).     She is unable to use a cane or walker due to reduced leg strength and spasticity.   She cannot self-propel a manual wheelchair due to arm weakness.    Therefore she needs a power vehicle.    She cannot use a scooter due to larger turning radius and inability to recline and elevate legs.     Therefore she needs a power wheelchair.   She will be spending the majority of the day in the power wheelchair.   She needs leg lift and recline features.    MS and other issues She feels dizzy, sometimes awakening her at night feeling like she is being pushed to the left.  Can happen while laying down or sitting.   Also these spells occur with position changes.    She is wheelchair bound and cannot do much for herself like dressing and needs help to transfer,        She has more trouble getting in and out of a van than she used it.    She uses an Mining engineer wheelchair at home.    She cannot use a walker.   She has right > left weakness and spasticity in legs.    She  has some numbness that is not bothersome or painful.   She has a lot of urinary urgency and takes solifenacin,   Vision is btter with new glasses.  No diplopia.    She has a tremor in her right hand    Writing and makeup is more difficult.    She is on metoprolol for her tremor and heart and it helpd.  It likely helped her not  have a rapid AFib (HR was 95).    She sees Dr. Jacinto Webb.       Sh gets edema and she uses a leg lift function on her wheelchair with some benefit.   She has a trainer come to help exercise a few times a week.     She has fatigue.   Mood is fine.    Cognition is doing well.   She sleeps well taking melatonin at night.  She is noting more issues with rashes and has a dermatologist at [facility].    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  For the painful rash, she takes gabapentin 300 mg po bid and we discussed one in AM and 2 at night.  She is now seeing palliative care ad is on methadone (was 10 mg a day then 5 mg a day) and  tramadol.  Tramadol made her confused and she stopped.   She is still mildly confused and her husband is going to stop the methadone entirely.  MS History: She was diagnosed with MS in 1984 after presenting with numbness in both legs.  She had her husband have recently moved to the [place] area from [place].  While in [place], she was seen Dr. Huel Webb for her multiple sclerosis.    She reports that the numbness that presented in 1984 was present in both legs.  She improved over time and did not receive any steroids at that time..  Initially, she was not on any DMT but started Copaxone in the 1990's.   She had progression and over the years was also on Rebif, Tecfidera, Aubagio, Zinbryta and Gilenya.  On over the medications, she continued to have slow worsening of her disability.   She did have some definite motor and visual exacerbations in the 1980's and 1990's.  She had a severe UTI in 2018 and she stopped all DMTs at that time.     She has a bladder stimulator (not functional) and is unable to get an MRI.   Her last MRI was around 2013.  She has worked with a Systems analyst and done physical therapy.  This has helped her gain better ability to transfer.   MRI of the brain and cervical spine from 06/10/2012 was personally reviewed.  The MRI of the cervical spine showed T2 hyperintense foci adjacent to C1-C2, C4 and C7 posteriorly and adjacent to C5-C6 to the left.  Additionally there are degenerative changes at C5-C6 causing mild spinal stenosis and right greater than left foraminal narrowing.  The MRI of the brain showed multiple T2/FLAIR hyperintense foci in the periventricular, juxtacortical and deep white matter of both hemispheres.  Additionally there is a T2 hyperintense focus anteriorly within the pontomedullary junction towards the right.  REVIEW OF SYSTEMS: Constitutional: No fevers, chills, sweats, or change in appetite.  She has fatigue.  Eyes: No visual changes, double vision, eye pain Ear, nose and throat: No hearing loss, ear pain, nasal congestion, sore throat Cardiovascular: No chest pain, palpitations Respiratory:  No shortness of breath at rest or with exertion.   No wheezes GastrointestinaI: No nausea, vomiting, diarrhea, abdominal pain, fecal incontinence Genitourinary: Urinary frequency, urgency and multiple UTIs Musculoskeletal: She often has left shoulder pain.    She has scoliosis Integumentary: No rash, pruritus, skin lesions Neurological: as above Psychiatric: No depression at this time.  No anxiety Endocrine: No palpitations, diaphoresis, change in appetite, change in weigh or increased thirst Hematologic/Lymphatic:  No anemia, purpura, petechiae. Allergic/Immunologic: No itchy/runny eyes, nasal congestion, recent allergic reactions, rashes  ALLERGIES: No Known Allergies  HOME MEDICATIONS:  Current Outpatient Medications:    amiodarone (PACERONE) 200 MG tablet, Take 0.5 tablets (100 mg  total) by mouth daily., Disp: , Rfl:    AMPYRA 10 MG TB12, Take 1 tablet (10 mg total) by mouth in the morning and at bedtime. BRAND NAME AMPYRA MEDICALLY NECESSARY, Disp: 60 tablet, Rfl: 10   baclofen (LIORESAL) 10 MG tablet, TAKE 1/2 TABLET TWICE DAILY, Disp: 90 tablet, Rfl: 2   clobetasol cream (TEMOVATE) 0.05 %, Apply 1 application topically as needed., Disp: , Rfl:    Cranberry-Vitamin C-Probiotic (AZO CRANBERRY PO), Take 1 tablet by mouth daily., Disp: , Rfl:    diclofenac Sodium (VOLTAREN) 1 % GEL, Apply 2 g topically as needed (joint pain)., Disp: , Rfl:    estradiol (ESTRING) 2  MG vaginal ring, Place 2 mg vaginally every 3 (three) months. follow package directions, Disp: , Rfl:    folic acid (FOLVITE) 1 MG tablet, Take 1 mg by mouth daily., Disp: , Rfl:    gabapentin (NEURONTIN) 300 MG capsule, Take 300 mg by mouth 3 (three) times daily., Disp: , Rfl:    hydrocortisone 2.5 % cream, Apply 1 application topically as needed., Disp: , Rfl:    levothyroxine (SYNTHROID) 25 MCG tablet, Take 75 mcg by mouth daily before breakfast., Disp: , Rfl:    liothyronine (CYTOMEL) 5 MCG tablet, Take 5 mcg by mouth in the morning and at bedtime., Disp: , Rfl:    methenamine (HIPREX) 1 g tablet, Take 1 g by mouth daily., Disp: , Rfl:    methotrexate (RHEUMATREX) 2.5 MG tablet, Take 2.5 mg by mouth once a week. Caution:Chemotherapy. Protect from light. 6 tablets weekley, Disp: , Rfl:    mupirocin cream (BACTROBAN) 2 %, Apply 1 application topically as needed., Disp: , Rfl:    nitrofurantoin (MACRODANTIN) 100 MG capsule, Take 50 mg by mouth at bedtime., Disp: , Rfl:    pantoprazole (PROTONIX) 40 MG tablet, Take 40 mg by mouth daily., Disp: , Rfl:    polyethylene glycol (MIRALAX / GLYCOLAX) 17 g packet, Take 17 g by mouth daily., Disp: , Rfl:    Probiotic Product (PROBIOTIC PO), Take 2 tablets by mouth daily., Disp: , Rfl:    propranolol ER (INDERAL LA) 80 MG 24 hr capsule, Take 1 capsule (80 mg total) by mouth  daily., Disp: 90 capsule, Rfl: 1   solifenacin (VESICARE) 5 MG tablet, Take 1 tablet by mouth daily., Disp: , Rfl:    triamcinolone cream (KENALOG) 0.1 %, Apply 1 application topically as needed., Disp: , Rfl:    VITAMIN D PO, Take 4,000 Units by mouth daily., Disp: , Rfl:    zolpidem (AMBIEN) 5 MG tablet, Take 5 mg by mouth at bedtime., Disp: , Rfl:    apixaban (ELIQUIS) 2.5 MG TABS tablet, Take 1 tablet (2.5 mg total) by mouth 2 (two) times daily., Disp: 60 tablet, Rfl: 0   rosuvastatin (CRESTOR) 20 MG tablet, Take 20 mg by mouth daily. (Patient not taking: Reported on 12/24/2022), Disp: , Rfl:   PAST MEDICAL HISTORY: Past Medical History:  Diagnosis Date   Anxiety    High cholesterol    Hypertrophic cardiomyopathy (HCC)    Multiple sclerosis (HCC)    Paroxysmal A-fib (HCC)    Rotator cuff tear    left    PAST SURGICAL HISTORY: Past Surgical History:  Procedure Laterality Date   BLADDER SURGERY  2018   STEM placement    ULNAR NERVE REPAIR Right 2010    FAMILY HISTORY: Family History  Problem Relation Age of Onset   Heart attack Mother    Transient ischemic attack Mother    Arthritis Mother    Heart Problems Father     SOCIAL HISTORY:  Social History   Socioeconomic History   Marital status: Married    Spouse name: [name]   Number of children: 3   Years of education: [employer] education level: Not on file  Occupational History   Occupation: Retired  Tobacco Use   Smoking status: Never   Smokeless tobacco: Never  Vaping Use   Vaping Use: Never used  Substance and Sexual Activity   Alcohol use: Yes    Comment: 2 drinks per week   Drug use: Never   Sexual activity: Not Currently  Other Topics Concern   Not on file  Social History Narrative   Originally right handed but had surgery and now uses left hand for eating and other activities   Caffeine use: 2 cups coffee per day   Lives with husband   Lives at [place] with husband in [place]    Social Determinants of Health   Financial Resource Strain: Not on file  Food Insecurity: Not on file  Transportation Needs: Not on file  Physical Activity: Not on file  Stress: Not on file  Social Connections: Not on file  Intimate Partner Violence: Not on file     PHYSICAL EXAM  Vitals:   12/24/22 1030  BP: 107/60  Pulse: 81  Height: 5\' 2"  (1.575 m)     Body mass index is 21.95 kg/m.   General: The patient is well-developed and well-nourished and in no acute distress.     She is in a wheelchair.  She has severe scoliosis.  She had reduced breath sounds on the left  HEENT:  Head is [place]/AT.  Sclera are anicteric.   .  Skin: Extremities are without rash but she has bilateral pedal edema and some skin abrasions.    Neurologic Exam  Mental status: The patient is alert and oriented x 3 at the time of the examination. The patient has apparent normal recent and remote memory, with an apparently normal attention span and concentration ability.   Speech is normal.  Cranial nerves: Extraocular movements are full.  Color vision was symmetric.  Facial strength and sensation is normal.. No obvious hearing deficits are noted.   Weber is midline  Motor: Her intention tremor in the hands has improved.  Muscle bulk is reduced in the ulnar innervated hand muscles bilaterally, right worse than left.   Tone is increased in the right more than left leg.  . Strength is 4-/5 in the ulnar innervated hand muscles on the right.  Proximal muscles on the right 4/5.  In the left arm, strength was 4 to 4+ /5.  In the legs, strength was 2/5 in the left hip flexors and 4-/5 in the quadriceps and 3/5 elsewhere in the leg.  In the right leg, strength was 1/5 at the hip flexors, 2+/5 in the quadricep muscles and 2- to 2 in the lower leg.  Sensory: Sensory testing was intact to touch and vibration sensation in the legs..  Coordination: Cerebellar testing reveals good finger-nose-finger and she can't do  heel-to-shin bilaterally.  Other: She has a rapid tremor in her hands that increases with intention.  Gait and station: She cannot stand or walk  Reflexes: Deep tendon reflexes are increased in legs, right > left.          ASSESSMENT AND PLAN  Multiple sclerosis (HCC)  Spastic diplegia (HCC)  Gait disorder  Wheelchair dependence  Shortness of breath  Essential tremor  Vertigo   1.   She is here today for a mobility examination.  I recommend that she obtain an electric wheelchair with elevation, leg elevation and recline features and hand controls on the right..  The wheelchair is necessary for her to perform her activities of daily living.   She has the cognitive and physical ability to operate this type of power wheelchair.  I provided a prescription to the husband who is meeting with numotion tomorrow.  2.   She has an inactive form of secondary progressive MS with slow progression..  This is unlikely to respond much to medication.  Therefore, we will have her remain off of a disease modifying therapy   3..   She f has had benefit from Ampyra and will continue..     Continue metoprolol for tremor - Continue baclofen and other medications.       4.  Episodes of vertigo are brief and sometimes positional --- Weber did not lateralize.  Refer to ENT to rule out a primary ear disorder.  If worsening, consider vestibular therapy.  5.  Change gabapentin to 300 mg in am and 600 mg at night  6.   She has had shortness of breath.  She has not discussed this with her primary care yet.  Breath sounds seem to be reduced on the left and I will check an x-ray.  Also of note she does have some severe scoliosis that could affect breathing.    7.   Return in 6 months or sooner if there are new or worsening neurologic symptoms.   45-minute office visit with the majority of the time spent face-to-face for history and physical, discussion/counseling and decision-making.  Additional time with  record review and documentation.  [id number]:  This visit is part of a comprehensive longitudinal care medical relationship regarding the patients primary diagnosis of SPMS and related concerns.    Campbell Kray A. Epimenio Foot, MD, [facility] 12/24/2022, 11:12 AM Certified in Neurology, Clinical Neurophysiology, Sleep Medicine and Neuroimaging  [facility] [address] [place], [place] [address] [phone]

## 2022-12-24 NOTE — Telephone Encounter (Signed)
Referral  faxed to [facility]: Phone [phone]  Fax: [phone]

## 2022-12-26 ENCOUNTER — Encounter: Payer: Self-pay | Admitting: Neurology

## 2022-12-27 ENCOUNTER — Encounter (HOSPITAL_BASED_OUTPATIENT_CLINIC_OR_DEPARTMENT_OTHER): Payer: Self-pay

## 2022-12-27 ENCOUNTER — Emergency Department (HOSPITAL_BASED_OUTPATIENT_CLINIC_OR_DEPARTMENT_OTHER)
Admission: EM | Admit: 2022-12-27 | Discharge: 2022-12-27 | Disposition: A | Discharge status: Home / Self Care | Payer: Medicare Other | Source: Home / Self Care | Attending: Emergency Medicine | Admitting: Emergency Medicine

## 2022-12-27 ENCOUNTER — Encounter: Payer: Self-pay | Admitting: Cardiology

## 2022-12-27 ENCOUNTER — Emergency Department (HOSPITAL_BASED_OUTPATIENT_CLINIC_OR_DEPARTMENT_OTHER): Payer: Medicare Other

## 2022-12-27 ENCOUNTER — Other Ambulatory Visit: Payer: Self-pay

## 2022-12-27 DIAGNOSIS — N3001 Acute cystitis with hematuria: Secondary | ICD-10-CM | POA: Insufficient documentation

## 2022-12-27 DIAGNOSIS — E039 Hypothyroidism, unspecified: Secondary | ICD-10-CM | POA: Insufficient documentation

## 2022-12-27 DIAGNOSIS — Z7901 Long term (current) use of anticoagulants: Secondary | ICD-10-CM | POA: Insufficient documentation

## 2022-12-27 DIAGNOSIS — I110 Hypertensive heart disease with heart failure: Secondary | ICD-10-CM | POA: Diagnosis not present

## 2022-12-27 DIAGNOSIS — Z79899 Other long term (current) drug therapy: Secondary | ICD-10-CM | POA: Insufficient documentation

## 2022-12-27 DIAGNOSIS — I509 Heart failure, unspecified: Secondary | ICD-10-CM | POA: Diagnosis not present

## 2022-12-27 DIAGNOSIS — N3091 Cystitis, unspecified with hematuria: Secondary | ICD-10-CM

## 2022-12-27 DIAGNOSIS — R310 Gross hematuria: Secondary | ICD-10-CM

## 2022-12-27 LAB — COMPREHENSIVE METABOLIC PANEL
ALT: 25 U/L (ref 0–44)
AST: 24 U/L (ref 15–41)
Albumin: 3.1 g/dL — ABNORMAL LOW (ref 3.5–5.0)
Alkaline Phosphatase: 53 U/L (ref 38–126)
Anion gap: 10 (ref 5–15)
BUN: 48 mg/dL — ABNORMAL HIGH (ref 8–23)
CO2: 23 mmol/L (ref 22–32)
Calcium: 8.6 mg/dL — ABNORMAL LOW (ref 8.9–10.3)
Chloride: 105 mmol/L (ref 98–111)
Creatinine, Ser: 0.61 mg/dL (ref 0.44–1.00)
GFR, Estimated: >60 mL/min (ref >60)
Glucose, Bld: 103 mg/dL — ABNORMAL HIGH (ref 70–99)
Potassium: 3.9 mmol/L (ref 3.5–5.1)
Sodium: 138 mmol/L (ref 135–145)
Total Bilirubin: 0.7 mg/dL (ref 0.3–1.2)
Total Protein: 6.2 g/dL — ABNORMAL LOW (ref 6.5–8.1)

## 2022-12-27 LAB — CBC WITH DIFFERENTIAL/PLATELET
Abs Immature Granulocytes: 0.06 10*3/uL (ref 0.00–0.07)
Basophils Absolute: 0 10*3/uL (ref 0.0–0.1)
Basophils Relative: 0 %
Eosinophils Absolute: 0.2 10*3/uL (ref 0.0–0.5)
Eosinophils Relative: 2 %
HCT: 37.9 % (ref 36.0–46.0)
Hemoglobin: 11.8 g/dL — ABNORMAL LOW (ref 12.0–15.0)
Immature Granulocytes: 1 %
Lymphocytes Relative: 5 %
Lymphs Abs: 0.5 10*3/uL — ABNORMAL LOW (ref 0.7–4.0)
MCH: 28.8 pg (ref 26.0–34.0)
MCHC: 31.1 g/dL (ref 30.0–36.0)
MCV: 92.4 fL (ref 80.0–100.0)
Monocytes Absolute: 0.7 10*3/uL (ref 0.1–1.0)
Monocytes Relative: 7 %
Neutro Abs: 8.2 10*3/uL — ABNORMAL HIGH (ref 1.7–7.7)
Neutrophils Relative %: 85 %
Platelets: 367 10*3/uL (ref 150–400)
RBC: 4.1 MIL/uL (ref 3.87–5.11)
RDW: 18.1 % — ABNORMAL HIGH (ref 11.5–15.5)
WBC: 9.7 10*3/uL (ref 4.0–10.5)
nRBC: 0 % (ref 0.0–0.2)

## 2022-12-27 LAB — URINALYSIS, MICROSCOPIC (REFLEX)
RBC / HPF: >50 RBC/hpf (ref 0–5)
RBC / HPF: >50 RBC/hpf (ref 0–5)
WBC, UA: >50 WBC/hpf (ref 0–5)
WBC, UA: >50 WBC/hpf (ref 0–5)

## 2022-12-27 LAB — URINALYSIS, ROUTINE W REFLEX MICROSCOPIC

## 2022-12-27 LAB — PROTIME-INR
INR: 1.2 (ref 0.8–1.2)
Prothrombin Time: 15.4 seconds — ABNORMAL HIGH (ref 11.4–15.2)

## 2022-12-27 LAB — LIPASE, BLOOD: Lipase: 26 U/L (ref 11–51)

## 2022-12-27 MED ORDER — IOHEXOL 300 MG/ML  SOLN
100.0000 mL | Freq: Once | INTRAMUSCULAR | Status: AC | PRN
  Administered 2022-12-27: 100 mL via INTRAVENOUS

## 2022-12-27 MED ORDER — HYDROCODONE-ACETAMINOPHEN 5-325 MG PO TABS
1.0000 | ORAL_TABLET | Freq: Once | ORAL | Status: AC
  Administered 2022-12-27: 1 via ORAL
  Filled 2022-12-27: qty 1

## 2022-12-27 MED ORDER — SODIUM CHLORIDE 0.9 % IV BOLUS
1000.0000 mL | Freq: Once | INTRAVENOUS | Status: AC
  Administered 2022-12-27: 1000 mL via INTRAVENOUS

## 2022-12-27 MED ORDER — HYDROCODONE-ACETAMINOPHEN 5-325 MG PO TABS: 1.0000 | ORAL_TABLET | Freq: Three times a day (TID) | ORAL | 0 refills | Status: DC | PRN

## 2022-12-27 MED ORDER — SODIUM CHLORIDE 0.9 % IV SOLN
1.0000 g | Freq: Once | INTRAVENOUS | Status: AC
  Administered 2022-12-27: 1 g via INTRAVENOUS
  Filled 2022-12-27: qty 10

## 2022-12-27 MED ORDER — SODIUM CHLORIDE 0.9 % IV SOLN: INTRAVENOUS | Status: DC | PRN

## 2022-12-27 MED ORDER — CEPHALEXIN 500 MG PO CAPS: 500.0000 mg | ORAL_CAPSULE | Freq: Three times a day (TID) | ORAL | 0 refills | Status: DC

## 2022-12-27 NOTE — ED Notes (Signed)
ED Provider at bedside. 

## 2022-12-27 NOTE — ED Provider Notes (Signed)
  3:16 PM Patient signed out to me by previous ED physician. Pt is a 89 yo female with PMH of MS, wheel chair bound, presenting to ED for painless hematuria with clots. On eilquis for afib.  CT: bladder diverticula UA positive. Rocephin given. Culture sent. Foley cath inserted.  Plan: Pt has urogyn Dr. Ashley Royalty. Pending call back to ensure close f/u and management. DC with plan to hold thinner.   Physical Exam  BP 124/85   Pulse 60   Temp 98.3 F (36.8 C) (Oral)   Resp (!) 29   Ht 5\' 2"  (1.575 m)   Wt 53 kg   SpO2 90%   BMI 21.36 kg/m   Physical Exam  Procedures  Procedures  ED Course / MDM   Clinical Course as of 12/30/22 1229  Fri Dec 27, 2022  1153 BUN(!): 48 Slightly worse than prior, Cr 0.61 [SG]  1153 Bacteria, UA(!): MANY [SG]  1153 WBC, UA: >50 [SG]  1153 Squamous Epithelial / HPF: 6-10 Gross hematuria, will send culture and give rocephin although appears to be dirty catch [SG]  1322 Spoke w/ Dr Benancio Deeds, agree w/ 3 way. Stop DOAC, send culture from cath.  [SG]  1332 Spoke with nursing, urine sample that was sent was actually a sample that pt brought in from home. Will resend urine [SG]  1438 Pt follows w/ Tomasa Hose, MD, will d/w dr on call. [phone] 5th floor [place] [SG]  1526 Hemoglobin(!): 11.8 Baseline around 12-13 [SG]  1526 Spoke with urogyn at [facility], agree with plan, hold eliquis and continue 3 - way, f/u in the office next week [SG]    Clinical Course User Index [SG] Sloan Leiter, DO   Medical Decision Making Amount and/or Complexity of Data Reviewed Labs: ordered. Decision-making details documented in ED Course. Radiology: ordered.  Risk Prescription drug management.   Consult to patient's established uro/gyn completed. Agrees with plan. Instructions to hold blood thinner. Patient sent home with foley cath and educational handout.   Patient in no distress and overall condition improved here in the ED. Detailed  discussions were had with the patient regarding current findings, and need for close f/u with PCP or on call doctor. The patient has been instructed to return immediately if the symptoms worsen in any way for re-evaluation. Patient verbalized understanding and is in agreement with current care plan. All questions answered prior to discharge.        Edwin Dada P, DO 12/30/22 1230

## 2022-12-27 NOTE — ED Provider Notes (Signed)
[facility] Provider Note  CSN: [id number] Arrival date & time: 12/27/22 4098  Chief Complaint(s) Vaginal Bleeding  HPI Tina Webb is a 89 y.o. female with past medical history as below, significant for sclerosis, wheelchair dependent, pA-fib on Eliquis, hypertrophic cardiomyopathy who presents to the ED with complaint of blood in urine.  Accompanied by spouse who is primary caretaker.  Patient woke up this morning with blood noted on her pad, she is frequently incontinent at baseline.  She urinated prior to arrival and spouse noted multiple blood clots and gross blood in the urine.  She denies any vaginal bleeding or rectal bleeding.  Bowel pain, nausea or vomiting.  She is compliant with her Eliquis with last dose yesterday evening for her A-fib.  Recent traumatic injuries, no change in bowel function, no BRBPR or melena.  Reports she had her blood in her urine many years ago but does not recall why.  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   Wheelchair dependence 12/24/2022   Essential tremor 08/23/2021   Primary osteoarthritis of left shoulder 10/05/2020   Chronic left shoulder pain 08/10/2020   Chronic night sweats 08/09/2020   Multiple sclerosis (HCC) 11/02/2019   Gait disorder 11/02/2019   Weakness 11/02/2019   Urge incontinence of urine 11/02/2019   Foot-drop 10/06/2019   Senile osteoporosis 10/06/2019   Contracted bladder 06/17/2016   Injury of tendon of rotator cuff 04/25/2016   Neurogenic bladder 04/25/2016   Hypothyroidism 08/16/2010   Hypertrophic cardiomyopathy (HCC) 07/08/2010   Vitamin D deficiency 11/13/2009   Gastroesophageal reflux  disease 08/14/2009   Home Medication(s) Prior to Admission medications   Medication Sig Start Date End Date Taking? Authorizing Provider  amiodarone (PACERONE) 200 MG tablet Take 0.5 tablets (100 mg total) by mouth daily. 11/20/22  Yes Yates Decamp, MD  AMPYRA 10 MG TB12 Take 1 tablet (10 mg total) by mouth in the morning and at bedtime. BRAND NAME AMPYRA MEDICALLY NECESSARY 10/02/22  Yes Sater, Pearletha Furl, MD  apixaban (ELIQUIS) 2.5 MG TABS tablet Take 1 tablet (2.5 mg total) by mouth 2 (two) times daily. 02/25/22 12/27/22 Yes Cristopher Peru, PA-C  baclofen (LIORESAL) 10 MG tablet TAKE 1/2 TABLET TWICE DAILY 10/09/22  Yes Sater, Pearletha Furl, MD  cephALEXin (KEFLEX) 500 MG capsule Take 1 capsule (500 mg total) by mouth 3 (three) times daily for 7 days. 12/27/22 01/03/23 Yes Sloan Leiter, DO  estradiol (ESTRING) 2 MG vaginal ring Place 2 mg vaginally every 3 (three) months. follow package directions   Yes [provider]  folic acid (FOLVITE) 1 MG tablet Take 1 mg by mouth daily.   Yes [provider]  gabapentin (NEURONTIN) 300 MG capsule Take 300 mg by mouth 3 (three) times daily.   Yes [provider]  HYDROcodone-acetaminophen (NORCO) 5-325 MG tablet Take 1 tablet by mouth every 8 (eight) hours as needed for up to 3 days for severe pain. 12/27/22 12/30/22 Yes Edwin Dada P, DO  levothyroxine (SYNTHROID) 25 MCG tablet Take 75 mcg by mouth daily before breakfast.   Yes [provider]  liothyronine (CYTOMEL) 5 MCG tablet Take  5 mcg by mouth in the morning and at bedtime.   Yes [provider]  methenamine (HIPREX) 1 g tablet Take 1 g by mouth daily.   Yes [provider]  methotrexate (RHEUMATREX) 2.5 MG tablet Take 2.5 mg by mouth once a week. Caution:Chemotherapy. Protect from light. 6 tablets weekley   Yes [provider]  nitrofurantoin (MACRODANTIN) 100 MG capsule Take 50 mg by mouth at bedtime.   Yes [provider]  pantoprazole  (PROTONIX) 40 MG tablet Take 40 mg by mouth daily. 04/01/22  Yes [provider]  polyethylene glycol (MIRALAX / GLYCOLAX) 17 g packet Take 17 g by mouth daily.   Yes [provider]  Probiotic Product (PROBIOTIC PO) Take 2 tablets by mouth daily.   Yes [provider]  propranolol ER (INDERAL LA) 80 MG 24 hr capsule Take 1 capsule (80 mg total) by mouth daily. 12/11/22  Yes Yates Decamp, MD  solifenacin (VESICARE) 5 MG tablet Take 1 tablet by mouth daily. 02/21/22  Yes [provider]  VITAMIN D PO Take 4,000 Units by mouth daily.   Yes [provider]  clobetasol cream (TEMOVATE) 0.05 % Apply 1 application topically as needed.    [provider]  Cranberry-Vitamin C-Probiotic (AZO CRANBERRY PO) Take 1 tablet by mouth daily.    [provider]  diclofenac Sodium (VOLTAREN) 1 % GEL Apply 2 g topically as needed (joint pain).    [provider]  hydrocortisone 2.5 % cream Apply 1 application topically as needed.    [provider]  mupirocin cream (BACTROBAN) 2 % Apply 1 application topically as needed.    [provider]  rosuvastatin (CRESTOR) 20 MG tablet Take 20 mg by mouth daily. Patient not taking: Reported on 12/24/2022    [provider]  triamcinolone cream (KENALOG) 0.1 % Apply 1 application topically as needed.    [provider]  zolpidem (AMBIEN) 5 MG tablet Take 5 mg by mouth at bedtime.    [provider]                                                                                                                                    Past Surgical History Past Surgical History:  Procedure Laterality Date   BLADDER SURGERY  2018   STEM placement    ULNAR NERVE REPAIR Right 2010   Family History Family History  Problem Relation Age of Onset   Heart attack Mother    Transient ischemic attack Mother    Arthritis Mother    Heart Problems Father     Social  History Social History   Tobacco Use   Smoking status: Never   Smokeless tobacco: Never  Vaping Use   Vaping Use: Never used  Substance Use Topics   Alcohol use: Yes    Comment: 2 drinks per week   Drug use: Never   Allergies  Patient has no known allergies.  Review of Systems Review of Systems  Constitutional:  Negative for activity change and fever.  HENT:  Negative for facial swelling and trouble swallowing.   Eyes:  Negative for discharge and redness.  Respiratory:  Negative for cough and shortness of breath.   Cardiovascular:  Negative for chest pain and palpitations.  Gastrointestinal:  Negative for abdominal pain and nausea.  Genitourinary:  Positive for hematuria. Negative for dysuria and flank pain.  Musculoskeletal:  Negative for back pain and gait problem.  Skin:  Negative for pallor and rash.  Neurological:  Negative for syncope and headaches.    Physical Exam Vital Signs  I have reviewed the triage vital signs BP (!) 119/51   Pulse (!) 51   Temp 97.9 F (36.6 C) (Oral)   Resp (!) 28   Ht 5\' 2"  (1.575 m)   Wt 53 kg   SpO2 91%   BMI 21.36 kg/m  Physical Exam Vitals and nursing note reviewed.  Constitutional:      General: She is not in acute distress.    Appearance: Normal appearance.     Comments: Frail  HENT:     Head: Normocephalic and atraumatic.     Right Ear: External ear normal.     Left Ear: External ear normal.     Nose: Nose normal.     Mouth/Throat:     Mouth: Mucous membranes are moist.  Eyes:     General: No scleral icterus.       Right eye: No discharge.        Left eye: No discharge.  Cardiovascular:     Rate and Rhythm: Normal rate and regular rhythm.     Pulses: Normal pulses.     Heart sounds: Normal heart sounds.  Pulmonary:     Effort: Pulmonary effort is normal. No respiratory distress.     Breath sounds: Normal breath sounds.  Abdominal:     General: Abdomen is flat.     Palpations: Abdomen is soft.     Tenderness:  There is no abdominal tenderness.  Musculoskeletal:     Cervical back: No rigidity.     Right lower leg: No edema.     Left lower leg: No edema.  Skin:    General: Skin is warm and dry.     Capillary Refill: Capillary refill takes less than 2 seconds.  Neurological:     Mental Status: She is alert.  Psychiatric:        Mood and Affect: Mood normal.        Behavior: Behavior normal.     ED Results and Treatments Labs (all labs ordered are listed, but only abnormal results are displayed) Labs Reviewed  CBC WITH DIFFERENTIAL/PLATELET - Abnormal; Notable for the following components:      Result Value   Hemoglobin 11.8 (*)    RDW 18.1 (*)    Neutro Abs 8.2 (*)    Lymphs Abs 0.5 (*)    All other components within normal limits  COMPREHENSIVE METABOLIC PANEL - Abnormal; Notable for the following components:   Glucose, Bld 103 (*)    BUN 48 (*)    Calcium 8.6 (*)    Total Protein 6.2 (*)    Albumin 3.1 (*)    All other components within normal limits  URINALYSIS, ROUTINE W REFLEX MICROSCOPIC - Abnormal; Notable for the following components:   Color, Urine RED (*)    APPearance TURBID (*)  Glucose, UA   (*)    Value: TEST NOT REPORTED DUE TO COLOR INTERFERENCE OF URINE PIGMENT   Hgb urine dipstick   (*)    Value: TEST NOT REPORTED DUE TO COLOR INTERFERENCE OF URINE PIGMENT   Bilirubin Urine   (*)    Value: TEST NOT REPORTED DUE TO COLOR INTERFERENCE OF URINE PIGMENT   Ketones, ur   (*)    Value: TEST NOT REPORTED DUE TO COLOR INTERFERENCE OF URINE PIGMENT   Protein, ur   (*)    Value: TEST NOT REPORTED DUE TO COLOR INTERFERENCE OF URINE PIGMENT   Nitrite   (*)    Value: TEST NOT REPORTED DUE TO COLOR INTERFERENCE OF URINE PIGMENT   Leukocytes,Ua   (*)    Value: TEST NOT REPORTED DUE TO COLOR INTERFERENCE OF URINE PIGMENT   All other components within normal limits  PROTIME-INR - Abnormal; Notable for the following components:   Prothrombin Time 15.4 (*)    All other  components within normal limits  URINALYSIS, MICROSCOPIC (REFLEX) - Abnormal; Notable for the following components:   Bacteria, UA MANY (*)    All other components within normal limits  URINALYSIS, ROUTINE W REFLEX MICROSCOPIC - Abnormal; Notable for the following components:   Color, Urine RED (*)    APPearance TURBID (*)    Glucose, UA   (*)    Value: TEST NOT REPORTED DUE TO COLOR INTERFERENCE OF URINE PIGMENT   Hgb urine dipstick   (*)    Value: TEST NOT REPORTED DUE TO COLOR INTERFERENCE OF URINE PIGMENT   Bilirubin Urine   (*)    Value: TEST NOT REPORTED DUE TO COLOR INTERFERENCE OF URINE PIGMENT   Ketones, ur   (*)    Value: TEST NOT REPORTED DUE TO COLOR INTERFERENCE OF URINE PIGMENT   Protein, ur   (*)    Value: TEST NOT REPORTED DUE TO COLOR INTERFERENCE OF URINE PIGMENT   Nitrite   (*)    Value: TEST NOT REPORTED DUE TO COLOR INTERFERENCE OF URINE PIGMENT   Leukocytes,Ua   (*)    Value: TEST NOT REPORTED DUE TO COLOR INTERFERENCE OF URINE PIGMENT   All other components within normal limits  URINALYSIS, MICROSCOPIC (REFLEX) - Abnormal; Notable for the following components:   Bacteria, UA MANY (*)    All other components within normal limits  URINE CULTURE  LIPASE, BLOOD                                                                                                                          Radiology CT ABDOMEN PELVIS W CONTRAST  Result Date: 12/27/2022 CLINICAL DATA:  Gross hematuria, vaginal bleeding, incontinence EXAM: CT ABDOMEN AND PELVIS WITH CONTRAST TECHNIQUE: Multidetector CT imaging of the abdomen and pelvis was performed using the standard protocol following bolus administration of intravenous contrast. RADIATION DOSE REDUCTION: This exam was performed according to the departmental dose-optimization program which includes automated exposure control,  adjustment of the mA and/or kV according to patient size and/or use of iterative reconstruction technique. CONTRAST:   OMNIPAQUE IOHEXOL 300 MG/ML  SOLN COMPARISON:  08/02/2021 FINDINGS: Lower chest: Large left, moderate right pleural effusions. Cardiomegaly. Hepatobiliary: No solid liver abnormality is seen. Sludge and small gallstones. No gallbladder wall thickening, or biliary dilatation. Pancreas: Unremarkable. No pancreatic ductal dilatation or surrounding inflammatory changes. Spleen: Normal in size without significant abnormality. Adrenals/Urinary Tract: Adrenal glands are unremarkable. Kidneys are normal, without renal calculi, solid lesion, or hydronephrosis. Severely diverticular urinary bladder, similar to prior examination (series 3, image 67). Stomach/Bowel: Stomach is within normal limits. Appendix not clearly visualized. No evidence of bowel wall thickening, distention, or inflammatory changes. Large burden of stool and stool balls throughout the colon and rectum. Vascular/Lymphatic: Aortic atherosclerosis. No enlarged abdominal or pelvic lymph nodes. Reproductive: No mass or other significant abnormality. Other: Fat containing umbilical hernia.  No ascites. Musculoskeletal: No acute or significant osseous findings. IMPRESSION: 1. Severely diverticular urinary bladder, similar to prior examination. 2. No evidence of urinary tract calculus or hydronephrosis. 3. Large burden of stool and stool balls throughout the colon and rectum. 4. Large left, moderate right pleural effusions. 5. Cardiomegaly. Aortic Atherosclerosis (ICD10-I70.0). Electronically Signed   By: Jearld Lesch M.D.   On: 12/27/2022 11:45    Pertinent labs & imaging results that were available during my care of the patient were reviewed by me and considered in my medical decision making (see MDM for details).  Medications Ordered in ED Medications  sodium chloride 0.9 % bolus 1,000 mL (0 mLs Intravenous Stopped 12/27/22 1423)  iohexol (OMNIPAQUE) 300 MG/ML solution 100 mL (100 mLs Intravenous Contrast Given 12/27/22 1059)  cefTRIAXone  (ROCEPHIN) 1 g in sodium chloride 0.9 % 100 mL IVPB (0 g Intravenous Stopped 12/27/22 1423)  HYDROcodone-acetaminophen (NORCO/VICODIN) 5-325 MG per tablet 1 tablet (1 tablet Oral Given 12/27/22 1537)                                                                                                                                     Procedures Procedures  (including critical care time)  Medical Decision Making / ED Course    Medical Decision Making:    Tina Webb is a 89 y.o. female with past medical history as below, significant for sclerosis, wheelchair dependent, pA-fib on Eliquis, hypertrophic cardiomyopathy who presents to the ED with complaint of blood in urine.. The complaint involves an extensive differential diagnosis and also carries with it a high risk of complications and morbidity.  Serious etiology was considered. Ddx includes but is not limited to: Bladder neoplasm, AVM, nephrolithiasis, UTI, vaginal bleeding, etc  Complete initial physical exam performed, notably the patient  was NAD, HDS, speaking fluently in full sentences.    Reviewed and confirmed nursing documentation for past medical history, family history, social history.  Vital signs reviewed.    Clinical Course as of  12/28/22 0827  Fri Dec 27, 2022  1153 BUN(!): 48 Slightly worse than prior, Cr 0.61 [SG]  1153 Bacteria, UA(!): MANY [SG]  1153 WBC, UA: >50 [SG]  1153 Squamous Epithelial / HPF: 6-10 Gross hematuria, will send culture and give rocephin although appears to be dirty catch [SG]  1322 Spoke w/ Dr Benancio Deeds, agree w/ 3 way. Stop DOAC, send culture from cath.  [SG]  1332 Spoke with nursing, urine sample that was sent was actually a sample that pt brought in from home. Will resend urine [SG]  1438 Pt follows w/ Tomasa Hose, MD, will d/w dr on call. [phone] [address] [place] [SG]  1526 Hemoglobin(!): 11.8 Baseline around 12-13 [SG]  1526 Spoke with urogyn at [facility], agree with  plan, hold eliquis and continue 3 - way, f/u in the office next week [SG]    Clinical Course User Index [SG] Sloan Leiter, DO   Patient here with likely hemorrhagic cystitis, hematuria.  Three-way catheter was placed and bleeding has essentially resolved, mostly clear urine draining.  No longer passing any clots.  Patient is having some discomfort with the catheter but improved with oral analgesics.  Discussed with urology here and also with urogynecology awake in regards to follow-up.  She would prefer to follow-up with her urogynecologist at [facility], they will call early next week for follow-up appointment.  She has appointment on Thursday but I will try to get her in the office sooner.  Patient given leg bag.  Given Foley instructions to her and spouse.  Advised to hold her Eliquis until seen by urologist.  Tina Webb this for A-fib.  Return for any worsening worrisome symptoms.  Patient signed out to incoming EDP pending completion of bladder irrigation and discharge.       Additional history obtained: -Additional history obtained from spouse -External records from outside source obtained and reviewed including: Chart review including previous notes, labs, imaging, consultation notes including home medications, prior ED eval, prior primary care documentation. Follows with Dr. Epimenio Foot in neurology   Lab Tests: -I ordered, reviewed, and interpreted labs.   The pertinent results include:   Labs Reviewed  CBC WITH DIFFERENTIAL/PLATELET - Abnormal; Notable for the following components:      Result Value   Hemoglobin 11.8 (*)    RDW 18.1 (*)    Neutro Abs 8.2 (*)    Lymphs Abs 0.5 (*)    All other components within normal limits  COMPREHENSIVE METABOLIC PANEL - Abnormal; Notable for the following components:   Glucose, Bld 103 (*)    BUN 48 (*)    Calcium 8.6 (*)    Total Protein 6.2 (*)    Albumin 3.1 (*)    All other components within normal limits  URINALYSIS, ROUTINE W REFLEX  MICROSCOPIC - Abnormal; Notable for the following components:   Color, Urine RED (*)    APPearance TURBID (*)    Glucose, UA   (*)    Value: TEST NOT REPORTED DUE TO COLOR INTERFERENCE OF URINE PIGMENT   Hgb urine dipstick   (*)    Value: TEST NOT REPORTED DUE TO COLOR INTERFERENCE OF URINE PIGMENT   Bilirubin Urine   (*)    Value: TEST NOT REPORTED DUE TO COLOR INTERFERENCE OF URINE PIGMENT   Ketones, ur   (*)    Value: TEST NOT REPORTED DUE TO COLOR INTERFERENCE OF URINE PIGMENT   Protein, ur   (*)    Value: TEST NOT REPORTED  DUE TO COLOR INTERFERENCE OF URINE PIGMENT   Nitrite   (*)    Value: TEST NOT REPORTED DUE TO COLOR INTERFERENCE OF URINE PIGMENT   Leukocytes,Ua   (*)    Value: TEST NOT REPORTED DUE TO COLOR INTERFERENCE OF URINE PIGMENT   All other components within normal limits  PROTIME-INR - Abnormal; Notable for the following components:   Prothrombin Time 15.4 (*)    All other components within normal limits  URINALYSIS, MICROSCOPIC (REFLEX) - Abnormal; Notable for the following components:   Bacteria, UA MANY (*)    All other components within normal limits  URINALYSIS, ROUTINE W REFLEX MICROSCOPIC - Abnormal; Notable for the following components:   Color, Urine RED (*)    APPearance TURBID (*)    Glucose, UA   (*)    Value: TEST NOT REPORTED DUE TO COLOR INTERFERENCE OF URINE PIGMENT   Hgb urine dipstick   (*)    Value: TEST NOT REPORTED DUE TO COLOR INTERFERENCE OF URINE PIGMENT   Bilirubin Urine   (*)    Value: TEST NOT REPORTED DUE TO COLOR INTERFERENCE OF URINE PIGMENT   Ketones, ur   (*)    Value: TEST NOT REPORTED DUE TO COLOR INTERFERENCE OF URINE PIGMENT   Protein, ur   (*)    Value: TEST NOT REPORTED DUE TO COLOR INTERFERENCE OF URINE PIGMENT   Nitrite   (*)    Value: TEST NOT REPORTED DUE TO COLOR INTERFERENCE OF URINE PIGMENT   Leukocytes,Ua   (*)    Value: TEST NOT REPORTED DUE TO COLOR INTERFERENCE OF URINE PIGMENT   All other components within  normal limits  URINALYSIS, MICROSCOPIC (REFLEX) - Abnormal; Notable for the following components:   Bacteria, UA MANY (*)    All other components within normal limits  URINE CULTURE  LIPASE, BLOOD    Notable for hgb stable from baseline, slightly reduced  EKG   EKG Interpretation  Date/Time:    Ventricular Rate:    PR Interval:    QRS Duration:   QT Interval:    QTC Calculation:   R Axis:     Text Interpretation:           Imaging Studies ordered: I ordered imaging studies including ctap I independently visualized the following imaging with scope of interpretation limited to determining acute life threatening conditions related to emergency care; findings noted above, significant for diverticular bladder I independently visualized and interpreted imaging. I agree with the radiologist interpretation   Medicines ordered and prescription drug management: Meds ordered this encounter  Medications   sodium chloride 0.9 % bolus 1,000 mL   iohexol (OMNIPAQUE) 300 MG/ML solution 100 mL   cefTRIAXone (ROCEPHIN) 1 g in sodium chloride 0.9 % 100 mL IVPB    Order Specific Question:   Antibiotic Indication:    Answer:   UTI   DISCONTD: 0.9 %  sodium chloride infusion    Carrier Fluid Protocol   cephALEXin (KEFLEX) 500 MG capsule    Sig: Take 1 capsule (500 mg total) by mouth 3 (three) times daily for 7 days.    Dispense:  21 capsule    Refill:  0   HYDROcodone-acetaminophen (NORCO/VICODIN) 5-325 MG per tablet 1 tablet   HYDROcodone-acetaminophen (NORCO) 5-325 MG tablet    Sig: Take 1 tablet by mouth every 8 (eight) hours as needed for up to 3 days for severe pain.    Dispense:  9 tablet    Refill:  0    -  I have reviewed the patients home medicines and have made adjustments as needed   Consultations Obtained: I requested consultation with the urology dr Benancio Deeds and at [facility],  and discussed lab and imaging findings as well as pertinent plan - they recommend: f/u office, hold  doac   Cardiac Monitoring: The patient was maintained on a cardiac monitor.  I personally viewed and interpreted the cardiac monitored which showed an underlying rhythm of: nsr  Social Determinants of Health:  Diagnosis or treatment significantly limited by social determinants of health: wheelchair dependent   Reevaluation: After the interventions noted above, I reevaluated the patient and found that they have improved  Co morbidities that complicate the patient evaluation  Past Medical History:  Diagnosis Date   Anxiety    High cholesterol    Hypertrophic cardiomyopathy (HCC)    Multiple sclerosis (HCC)    Paroxysmal A-fib (HCC)    Rotator cuff tear    left      Dispostion: Disposition decision including need for hospitalization was considered, and patient disposition pending at time of sign out.    Final Clinical Impression(s) / ED Diagnoses Final diagnoses:  Hemorrhagic cystitis  Gross hematuria     This chart was dictated using voice recognition software.  Despite best efforts to proofread,  errors can occur which can change the documentation meaning.    Sloan Leiter, DO 12/28/22 [id number]

## 2022-12-27 NOTE — ED Triage Notes (Signed)
C/o vaginal bleeding this morning, states woke up and saw blood all on her pad, states she is incontinent so unsure if it is from vagina or urethra. Denies abdominal pain or urinary symptoms.   On Eliquis.

## 2022-12-27 NOTE — ED Notes (Signed)
ED Provider at bedside to discuss discharge instructions 

## 2022-12-27 NOTE — ED Notes (Signed)
Output in foley is now running more clear but with intermittent large clots with the irrigation

## 2022-12-27 NOTE — Discharge Instructions (Addendum)
It was a pleasure caring for you today in the emergency department.  Please HOLD you ELIQUIS until seen by urology next week  Please return to the emergency department for any worsening or worrisome symptoms.

## 2022-12-27 NOTE — ED Notes (Signed)
Foley inserted per order, patient tolerated well, husband at bedside, denies questions/concerns at this time.

## 2022-12-27 NOTE — Telephone Encounter (Signed)
From pt

## 2022-12-28 LAB — URINE CULTURE: Culture: NO GROWTH

## 2022-12-29 ENCOUNTER — Telehealth: Payer: Self-pay | Admitting: Neurology

## 2022-12-29 NOTE — Telephone Encounter (Signed)
At her visit with me several days ago, she was reporting shortness of breath and I noticed reduced breath sounds.  I ordered a chest x-ray.  The results came back this morning.  It shows that she has a large left pulmonary effusion.  She also has cardiomegaly.  I called and spoke to her husband, [name], to go over the results.  He also informed me that 2 days ago she was in the emergency room because she had some hematuria.  They actually did a CT scan of the abdomen and pelvis but not the chest.  The pleural effusion was seen on the CT scan as well.  She continues to have shortness of breath just a little bit worse than a few days ago.  I believe her shortness of breath is related to the findings on the imaging studies.  They are going to contact her primary care in the morning about the results.  I did tell [name] to take center to the emergency room if she has any further worsening as the day goes on.  I also advised him that if he cannot reach primary care tomorrow to let us know and we will send a referral to pulmonology or will have her go to the ED for further evaluation if there is any worsening.

## 2022-12-30 ENCOUNTER — Telehealth: Payer: Self-pay | Admitting: *Deleted

## 2022-12-30 ENCOUNTER — Emergency Department (HOSPITAL_BASED_OUTPATIENT_CLINIC_OR_DEPARTMENT_OTHER): Payer: Medicare Other

## 2022-12-30 ENCOUNTER — Encounter (HOSPITAL_BASED_OUTPATIENT_CLINIC_OR_DEPARTMENT_OTHER): Payer: Self-pay

## 2022-12-30 ENCOUNTER — Inpatient Hospital Stay (HOSPITAL_BASED_OUTPATIENT_CLINIC_OR_DEPARTMENT_OTHER)
Admission: EM | Admit: 2022-12-30 | Discharge: 2023-01-02 | DRG: 291 | Disposition: A | Discharge status: Hospice | Payer: Medicare Other | Attending: Internal Medicine | Admitting: Internal Medicine

## 2022-12-30 DIAGNOSIS — Z79890 Hormone replacement therapy (postmenopausal): Secondary | ICD-10-CM | POA: Diagnosis not present

## 2022-12-30 DIAGNOSIS — X58XXXA Exposure to other specified factors, initial encounter: Secondary | ICD-10-CM | POA: Diagnosis present

## 2022-12-30 DIAGNOSIS — I480 Paroxysmal atrial fibrillation: Secondary | ICD-10-CM

## 2022-12-30 DIAGNOSIS — Z515 Encounter for palliative care: Secondary | ICD-10-CM | POA: Diagnosis not present

## 2022-12-30 DIAGNOSIS — I447 Left bundle-branch block, unspecified: Secondary | ICD-10-CM | POA: Diagnosis present

## 2022-12-30 DIAGNOSIS — N939 Abnormal uterine and vaginal bleeding, unspecified: Secondary | ICD-10-CM | POA: Diagnosis present

## 2022-12-30 DIAGNOSIS — C84A0 Cutaneous T-cell lymphoma, unspecified, unspecified site: Secondary | ICD-10-CM | POA: Diagnosis not present

## 2022-12-30 DIAGNOSIS — I110 Hypertensive heart disease with heart failure: Secondary | ICD-10-CM | POA: Diagnosis not present

## 2022-12-30 DIAGNOSIS — K5900 Constipation, unspecified: Secondary | ICD-10-CM | POA: Diagnosis not present

## 2022-12-30 DIAGNOSIS — J90 Pleural effusion, not elsewhere classified: Secondary | ICD-10-CM | POA: Diagnosis not present

## 2022-12-30 DIAGNOSIS — I5031 Acute diastolic (congestive) heart failure: Secondary | ICD-10-CM | POA: Diagnosis not present

## 2022-12-30 DIAGNOSIS — S81802D Unspecified open wound, left lower leg, subsequent encounter: Secondary | ICD-10-CM

## 2022-12-30 DIAGNOSIS — Z993 Dependence on wheelchair: Secondary | ICD-10-CM | POA: Diagnosis not present

## 2022-12-30 DIAGNOSIS — E7800 Pure hypercholesterolemia, unspecified: Secondary | ICD-10-CM | POA: Diagnosis present

## 2022-12-30 DIAGNOSIS — R54 Age-related physical debility: Secondary | ICD-10-CM | POA: Diagnosis present

## 2022-12-30 DIAGNOSIS — M7989 Other specified soft tissue disorders: Secondary | ICD-10-CM | POA: Diagnosis not present

## 2022-12-30 DIAGNOSIS — Z7189 Other specified counseling: Secondary | ICD-10-CM | POA: Diagnosis not present

## 2022-12-30 DIAGNOSIS — Z8249 Family history of ischemic heart disease and other diseases of the circulatory system: Secondary | ICD-10-CM

## 2022-12-30 DIAGNOSIS — Z66 Do not resuscitate: Secondary | ICD-10-CM | POA: Diagnosis present

## 2022-12-30 DIAGNOSIS — E039 Hypothyroidism, unspecified: Secondary | ICD-10-CM

## 2022-12-30 DIAGNOSIS — Z8261 Family history of arthritis: Secondary | ICD-10-CM

## 2022-12-30 DIAGNOSIS — J918 Pleural effusion in other conditions classified elsewhere: Secondary | ICD-10-CM | POA: Diagnosis not present

## 2022-12-30 DIAGNOSIS — Z79899 Other long term (current) drug therapy: Secondary | ICD-10-CM

## 2022-12-30 DIAGNOSIS — S81802A Unspecified open wound, left lower leg, initial encounter: Secondary | ICD-10-CM | POA: Diagnosis present

## 2022-12-30 DIAGNOSIS — R918 Other nonspecific abnormal finding of lung field: Secondary | ICD-10-CM | POA: Diagnosis present

## 2022-12-30 DIAGNOSIS — R001 Bradycardia, unspecified: Secondary | ICD-10-CM | POA: Diagnosis present

## 2022-12-30 DIAGNOSIS — G35 Multiple sclerosis: Secondary | ICD-10-CM | POA: Diagnosis present

## 2022-12-30 DIAGNOSIS — I5033 Acute on chronic diastolic (congestive) heart failure: Secondary | ICD-10-CM | POA: Diagnosis not present

## 2022-12-30 DIAGNOSIS — R310 Gross hematuria: Secondary | ICD-10-CM

## 2022-12-30 DIAGNOSIS — I509 Heart failure, unspecified: Secondary | ICD-10-CM | POA: Diagnosis present

## 2022-12-30 DIAGNOSIS — J9811 Atelectasis: Secondary | ICD-10-CM | POA: Diagnosis present

## 2022-12-30 DIAGNOSIS — N3001 Acute cystitis with hematuria: Secondary | ICD-10-CM | POA: Diagnosis not present

## 2022-12-30 DIAGNOSIS — Z7901 Long term (current) use of anticoagulants: Secondary | ICD-10-CM

## 2022-12-30 DIAGNOSIS — J9601 Acute respiratory failure with hypoxia: Secondary | ICD-10-CM

## 2022-12-30 DIAGNOSIS — N390 Urinary tract infection, site not specified: Secondary | ICD-10-CM

## 2022-12-30 DIAGNOSIS — N323 Diverticulum of bladder: Secondary | ICD-10-CM | POA: Diagnosis present

## 2022-12-30 DIAGNOSIS — F419 Anxiety disorder, unspecified: Secondary | ICD-10-CM | POA: Diagnosis present

## 2022-12-30 DIAGNOSIS — I421 Obstructive hypertrophic cardiomyopathy: Secondary | ICD-10-CM | POA: Diagnosis present

## 2022-12-30 DIAGNOSIS — R1314 Dysphagia, pharyngoesophageal phase: Secondary | ICD-10-CM | POA: Diagnosis present

## 2022-12-30 DIAGNOSIS — R269 Unspecified abnormalities of gait and mobility: Secondary | ICD-10-CM

## 2022-12-30 DIAGNOSIS — J810 Acute pulmonary edema: Secondary | ICD-10-CM

## 2022-12-30 LAB — CBC WITH DIFFERENTIAL/PLATELET
Abs Immature Granulocytes: 0.06 10*3/uL (ref 0.00–0.07)
Basophils Absolute: 0 10*3/uL (ref 0.0–0.1)
Basophils Relative: 0 %
Eosinophils Absolute: 0.2 10*3/uL (ref 0.0–0.5)
Eosinophils Relative: 2 %
HCT: 41.9 % (ref 36.0–46.0)
Hemoglobin: 13.4 g/dL (ref 12.0–15.0)
Immature Granulocytes: 1 %
Lymphocytes Relative: 5 %
Lymphs Abs: 0.5 10*3/uL — ABNORMAL LOW (ref 0.7–4.0)
MCH: 28.9 pg (ref 26.0–34.0)
MCHC: 32 g/dL (ref 30.0–36.0)
MCV: 90.3 fL (ref 80.0–100.0)
Monocytes Absolute: 0.8 10*3/uL (ref 0.1–1.0)
Monocytes Relative: 8 %
Neutro Abs: 9.1 10*3/uL — ABNORMAL HIGH (ref 1.7–7.7)
Neutrophils Relative %: 84 %
Platelets: 452 10*3/uL — ABNORMAL HIGH (ref 150–400)
RBC: 4.64 MIL/uL (ref 3.87–5.11)
RDW: 18.1 % — ABNORMAL HIGH (ref 11.5–15.5)
WBC: 10.7 10*3/uL — ABNORMAL HIGH (ref 4.0–10.5)
nRBC: 0 % (ref 0.0–0.2)

## 2022-12-30 LAB — URINALYSIS, ROUTINE W REFLEX MICROSCOPIC
Glucose, UA: NEGATIVE mg/dL
Ketones, ur: NEGATIVE mg/dL
Nitrite: NEGATIVE
Protein, ur: 100 mg/dL — AB
Specific Gravity, Urine: >=1.03 (ref 1.005–1.030)
pH: 6 (ref 5.0–8.0)

## 2022-12-30 LAB — BASIC METABOLIC PANEL
Anion gap: 10 (ref 5–15)
BUN: 29 mg/dL — ABNORMAL HIGH (ref 8–23)
CO2: 24 mmol/L (ref 22–32)
Calcium: 9.5 mg/dL (ref 8.9–10.3)
Chloride: 108 mmol/L (ref 98–111)
Creatinine, Ser: 0.63 mg/dL (ref 0.44–1.00)
GFR, Estimated: >60 mL/min (ref >60)
Glucose, Bld: 92 mg/dL (ref 70–99)
Potassium: 3.9 mmol/L (ref 3.5–5.1)
Sodium: 142 mmol/L (ref 135–145)

## 2022-12-30 LAB — URINALYSIS, MICROSCOPIC (REFLEX): RBC / HPF: >50 RBC/hpf (ref 0–5)

## 2022-12-30 LAB — BRAIN NATRIURETIC PEPTIDE: B Natriuretic Peptide: 1387.2 pg/mL — ABNORMAL HIGH (ref 0.0–100.0)

## 2022-12-30 LAB — MAGNESIUM: Magnesium: 2.3 mg/dL (ref 1.7–2.4)

## 2022-12-30 LAB — TSH: TSH: 5.15 u[IU]/mL — ABNORMAL HIGH (ref 0.350–4.500)

## 2022-12-30 MED ORDER — FUROSEMIDE 10 MG/ML IJ SOLN
40.0000 mg | Freq: Once | INTRAMUSCULAR | Status: AC
  Administered 2022-12-30: 40 mg via INTRAVENOUS
  Filled 2022-12-30: qty 4

## 2022-12-30 MED ORDER — SODIUM CHLORIDE 0.9 % IV SOLN: 250.0000 mL | INTRAVENOUS | Status: DC | PRN

## 2022-12-30 MED ORDER — HYDROCODONE-ACETAMINOPHEN 5-325 MG PO TABS: 1.0000 | ORAL_TABLET | Freq: Three times a day (TID) | ORAL | Status: DC | PRN

## 2022-12-30 MED ORDER — BACLOFEN 5 MG HALF TABLET
5.0000 mg | ORAL_TABLET | Freq: Two times a day (BID) | ORAL | Status: DC
  Administered 2022-12-30 – 2023-01-02 (×6): 5 mg via ORAL
  Filled 2022-12-30 (×6): qty 1

## 2022-12-30 MED ORDER — SODIUM CHLORIDE 0.9 % IV SOLN
1.0000 g | Freq: Every day | INTRAVENOUS | Status: DC
  Administered 2022-12-30 – 2023-01-01 (×3): 1 g via INTRAVENOUS
  Filled 2022-12-30 (×3): qty 10

## 2022-12-30 MED ORDER — POLYETHYLENE GLYCOL 3350 17 G PO PACK
17.0000 g | PACK | Freq: Every day | ORAL | Status: DC
  Administered 2022-12-31 – 2023-01-02 (×3): 17 g via ORAL
  Filled 2022-12-30 (×3): qty 1

## 2022-12-30 MED ORDER — FLUCONAZOLE 100 MG PO TABS
100.0000 mg | ORAL_TABLET | Freq: Once | ORAL | Status: AC
  Administered 2022-12-30: 100 mg via ORAL
  Filled 2022-12-30: qty 1

## 2022-12-30 MED ORDER — AMIODARONE HCL 200 MG PO TABS: 100.0000 mg | ORAL_TABLET | Freq: Every day | ORAL | Status: DC

## 2022-12-30 MED ORDER — ONDANSETRON HCL 4 MG PO TABS: 4.0000 mg | ORAL_TABLET | Freq: Four times a day (QID) | ORAL | Status: DC | PRN

## 2022-12-30 MED ORDER — PANTOPRAZOLE SODIUM 40 MG PO TBEC
40.0000 mg | DELAYED_RELEASE_TABLET | Freq: Every day | ORAL | Status: DC
  Administered 2022-12-31 – 2023-01-02 (×3): 40 mg via ORAL
  Filled 2022-12-30 (×3): qty 1

## 2022-12-30 MED ORDER — LEVOTHYROXINE SODIUM 75 MCG PO TABS
75.0000 ug | ORAL_TABLET | Freq: Every day | ORAL | Status: DC
  Administered 2022-12-31 – 2023-01-02 (×3): 75 ug via ORAL
  Filled 2022-12-30 (×3): qty 1

## 2022-12-30 MED ORDER — ONDANSETRON HCL 4 MG/2ML IJ SOLN
4.0000 mg | Freq: Four times a day (QID) | INTRAMUSCULAR | Status: DC | PRN
  Administered 2022-12-31 – 2023-01-01 (×2): 4 mg via INTRAVENOUS
  Filled 2022-12-30 (×2): qty 2

## 2022-12-30 MED ORDER — SODIUM CHLORIDE 0.9% FLUSH
3.0000 mL | Freq: Two times a day (BID) | INTRAVENOUS | Status: DC
  Administered 2022-12-30: 3 mL via INTRAVENOUS

## 2022-12-30 MED ORDER — GABAPENTIN 300 MG PO CAPS
300.0000 mg | ORAL_CAPSULE | Freq: Three times a day (TID) | ORAL | Status: DC
  Administered 2022-12-30 – 2022-12-31 (×2): 300 mg via ORAL
  Filled 2022-12-30 (×2): qty 1

## 2022-12-30 MED ORDER — LIOTHYRONINE SODIUM 5 MCG PO TABS
5.0000 ug | ORAL_TABLET | Freq: Every day | ORAL | Status: DC
  Administered 2022-12-31 – 2023-01-02 (×3): 5 ug via ORAL
  Filled 2022-12-30 (×4): qty 1

## 2022-12-30 MED ORDER — SODIUM CHLORIDE 0.9% FLUSH: 3.0000 mL | INTRAVENOUS | Status: DC | PRN

## 2022-12-30 MED ORDER — IOHEXOL 350 MG/ML SOLN
100.0000 mL | Freq: Once | INTRAVENOUS | Status: AC | PRN
  Administered 2022-12-30: 75 mL via INTRAVENOUS

## 2022-12-30 MED ORDER — DOCUSATE SODIUM 100 MG PO CAPS
100.0000 mg | ORAL_CAPSULE | Freq: Two times a day (BID) | ORAL | Status: DC
  Administered 2022-12-31 – 2023-01-02 (×5): 100 mg via ORAL
  Filled 2022-12-30 (×5): qty 1

## 2022-12-30 MED ORDER — BISACODYL 10 MG RE SUPP: 10.0000 mg | Freq: Every day | RECTAL | Status: DC | PRN

## 2022-12-30 MED ORDER — ALBUTEROL SULFATE (2.5 MG/3ML) 0.083% IN NEBU: 2.5000 mg | INHALATION_SOLUTION | RESPIRATORY_TRACT | Status: DC | PRN

## 2022-12-30 MED ORDER — FLUCONAZOLE 50 MG PO TABS: 50.0000 mg | ORAL_TABLET | Freq: Every day | ORAL | Status: DC

## 2022-12-30 MED ORDER — HYDROCODONE-ACETAMINOPHEN 5-325 MG PO TABS: 1.0000 | ORAL_TABLET | ORAL | Status: DC | PRN

## 2022-12-30 MED ORDER — ACETAMINOPHEN 325 MG PO TABS: 650.0000 mg | ORAL_TABLET | Freq: Four times a day (QID) | ORAL | Status: DC | PRN

## 2022-12-30 MED ORDER — ACETAMINOPHEN 650 MG RE SUPP: 650.0000 mg | Freq: Four times a day (QID) | RECTAL | Status: DC | PRN

## 2022-12-30 NOTE — ED Notes (Signed)
Pt provided tomato soup, cheese, and water, per her request. No further needs expressed. Family at bedside, call bell within reach, will continue to monitor.

## 2022-12-30 NOTE — ED Notes (Signed)
Attempted report x1, receiving RN unavailable.  This RN to call back.

## 2022-12-30 NOTE — H&P (Signed)
Tina Webb AVW:[id number] DOB: 08-14-1933 DOA: 12/30/2022     PCP: Nadara Eaton, MD   Outpatient Specialists:  CARDS:   Dr. Jacinto Halim   NEurology    Dr. Epimenio Foot    Patient arrived to ER on 12/30/22 at 1048 Referred by Attending Leroy Sea, MD   Patient coming from:    home Lives With family    Chief Complaint:   Chief Complaint  Patient presents with   Shortness of Breath    HPI: Tina Webb is a 89 y.o. 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  Status post bladder stimulator unable to get MRI  Presented with   sob Pt with known hx of MS wheelchair-bound at baseline and known history of hypertrophic cardiomyopathy In the past was taking Eliquis for atrial fibrillation but had to be stopped on 21 June secondary to hemorrhagic cystitis Patient was treated with antibiotics Keflex Now she has been short of breath for past few days chest x-ray was done by neurology that showed bilateral pleural effusions she was sent to emergency department for further evaluation She reports that she is more short of breath with exertion and at night when lays down flat but okay when she is just sitting up No fever    Denies significant ETOH intake   Does not smoke   No results found for: "SARSCOV2NAA"      Regarding pertinent Chronic problems:    Hyperlipidemia -  on statins      HTN on propranolol     Hypothyroidism: on synthroid   cutaneous T cell lymphoma followed by [facility] and is on methotrexate.        A. Fib -  - CHA2DS2 vas score  5       Not on anticoagulation secondary to  recurrent bleeding         -  Rate control:  Currently controlled with propranoliol        - Rhythm control:   amiodarone,       While in ER:   Worsening shortness of breath found to have bilateral pleural effusions     Lab Orders          Basic metabolic panel         CBC with Differential         Brain natriuretic peptide         Urinalysis, Routine w reflex microscopic -Urine, Catheterized; Indwelling urinary catheter         Urinalysis, Microscopic (reflex)         Magnesium         CXR - Unchanged moderate left and small right pleural effusions with bibasilar atelectasis    CTA chest -   no PE,  Moderate right and large left pleural effusions with associated atelectasis.  Soft tissue lesion of the medial left chest located between the costochondral cartilage of the first and second ribs, concerning for metastatic disease, although benign etiology also possible. Scattered solid pulmonary nodules measuring up to 6 mm.  Following Medications were ordered in ER: Medications  iohexol (OMNIPAQUE) 350 MG/ML injection 100 mL (75 mLs Intravenous Contrast Given 12/30/22 1247)  furosemide (LASIX) injection 40 mg (40 mg Intravenous Given 12/30/22 1428)    _______________________________________________________ ER Provider Called:  Cardiology    Dr.Tolia  They Recommend admit to medicine   Will see in  AM   ED Triage Vitals  Enc Vitals Group     BP 12/30/22 1054 98/73     Pulse Rate 12/30/22 1054 (!) 51     Resp 12/30/22 1054 19     Temp 12/30/22 1054 97.8 F (36.6 C)     Temp Source 12/30/22 1054 Oral     SpO2 12/30/22 1054 100 %     Weight --      Height --      Head Circumference --      Peak Flow --      Pain Score 12/30/22 1057 0     Pain Loc --      Pain Edu? --      Excl. in GC? --   TMAX(24)@     _________________________________________ Significant initial  Findings: Abnormal Labs Reviewed  BASIC METABOLIC PANEL - Abnormal; Notable for the following components:      Result Value   BUN 29 (*)    All other components within normal limits  CBC WITH DIFFERENTIAL/PLATELET - Abnormal; Notable for the following components:   WBC 10.7 (*)    RDW 18.1 (*)    Platelets 452 (*)    Neutro Abs 9.1 (*)     Lymphs Abs 0.5 (*)    All other components within normal limits  BRAIN NATRIURETIC PEPTIDE - Abnormal; Notable for the following components:   B Natriuretic Peptide 1,387.2 (*)    All other components within normal limits  URINALYSIS, ROUTINE W REFLEX MICROSCOPIC - Abnormal; Notable for the following components:   Color, Urine BROWN (*)    APPearance CLOUDY (*)    Hgb urine dipstick LARGE (*)    Bilirubin Urine SMALL (*)    Protein, ur 100 (*)    Leukocytes,Ua SMALL (*)    All other components within normal limits  URINALYSIS, MICROSCOPIC (REFLEX) - Abnormal; Notable for the following components:   Bacteria, UA MANY (*)    All other components within normal limits      _________________________ Troponin  ordered    ECG: Ordered Personally reviewed and interpreted by me showing: HR : 51  Rhythm: Ventricular premature complex Left bundle branch block Baseline wander in lead(s) V6 No significant change since last tracing QTC 546  BNP (last 3 results) Recent Labs    12/30/22 1101  BNP 1,387.2*         _________________ This patient meets SIRS Criteria and may be septic.    The recent clinical data is shown below. Vitals:   12/30/22 2000 12/30/22 2039 12/30/22 2040 12/30/22 2141  BP: 102/74  106/61 (!) 93/58  Pulse: (!) 47 (!) 45 (!) 45 (!) 47  Resp: (!) 29 14 (!) 25 15  Temp:    97.9 F (36.6 C)  TempSrc:    Oral  SpO2: 94% 94% 94% 95%      WBC     Component Value Date/Time   WBC 10.7 (H) 12/30/2022 1101   LYMPHSABS 0.5 (L) 12/30/2022 1101   MONOABS 0.8 12/30/2022 1101   EOSABS 0.2 12/30/2022 1101   BASOSABS 0.0 12/30/2022 1101      Procalcitonin   Ordered      UA  evidence of UTI yeast noted   Urine analysis:    Component Value Date/Time   COLORURINE BROWN (A) 12/30/2022 1157   APPEARANCEUR CLOUDY (A) 12/30/2022 1157   LABSPEC >=1.030 12/30/2022 1157   PHURINE 6.0 12/30/2022 1157   GLUCOSEU NEGATIVE 12/30/2022 1157   HGBUR LARGE (  A) 12/30/2022  1157   BILIRUBINUR SMALL (A) 12/30/2022 1157   KETONESUR NEGATIVE 12/30/2022 1157   PROTEINUR 100 (A) 12/30/2022 1157   NITRITE NEGATIVE 12/30/2022 1157   LEUKOCYTESUR SMALL (A) 12/30/2022 1157    Results for orders placed or performed during the hospital encounter of 12/27/22  Urine Culture     Status: None   Collection Time: 12/27/22  1:34 PM   Specimen: Urine, Catheterized  Result Value Ref Range Status   Specimen Description   Final    URINE, CATHETERIZED Performed at [facility], [address] [place], [place] [address]    Special Requests   Final    NONE Performed at [facility], [address] [place], [place] [address]    Culture   Final    NO GROWTH Performed at [facility], [address] [place], [place] [address]    Report Status 12/28/2022 FINAL  Final        __________________________________________________________ Recent Labs  Lab 12/27/22 0943 12/30/22 1101 12/30/22 1711  NA 138 142  --   K 3.9 3.9  --   CO2 23 24  --   GLUCOSE 103* 92  --   BUN 48* 29*  --   CREATININE 0.61 0.63  --   CALCIUM 8.6* 9.5  --   MG  --   --  2.3    Cr   stable,   Lab Results  Component Value Date   CREATININE 0.63 12/30/2022   CREATININE 0.61 12/27/2022   CREATININE 0.61 12/15/2022    Recent Labs  Lab 12/27/22 0943  AST 24  ALT 25  ALKPHOS 53  BILITOT 0.7  PROT 6.2*  ALBUMIN 3.1*   Lab Results  Component Value Date   CALCIUM 9.5 12/30/2022    Plt: Lab Results  Component Value Date   PLT 452 (H) 12/30/2022        Recent Labs  Lab 12/27/22 0943 12/30/22 1101  WBC 9.7 10.7*  NEUTROABS 8.2* 9.1*  HGB 11.8* 13.4  HCT 37.9 41.9  MCV 92.4 90.3  PLT 367 452*    HG/HCT stable,      Component Value Date/Time   HGB 13.4 12/30/2022 1101   HCT 41.9 12/30/2022 1101   MCV 90.3 12/30/2022 1101    Recent Labs  Lab 12/27/22 0943  LIPASE 26   No results for input(s): "AMMONIA" in the last 168 hours.     _______________________________________________ Hospitalist was called for admission for   MS  Bilateral pleural effusion   CHF UTI    The following Work up has been ordered so far:  Orders Placed This Encounter  Procedures   DG Chest Port 1 View   CT Angio Chest PE W and/or Wo Contrast   Basic metabolic panel   CBC with Differential   Brain natriuretic peptide   Urinalysis, Routine w reflex microscopic -Urine, Catheterized; Indwelling urinary catheter   Urinalysis, Microscopic (reflex)   Magnesium   ED Cardiac monitoring   Cardiac Monitoring Continuous x 48 hours Indications for use: High risk post non-cardiac surgery   Consult to hospitalist   Inpatient consult to Cardiology   ED EKG   EKG 12-Lead   Insert peripheral IV   Admit to Inpatient (patient's expected length of stay will be greater than 2 midnights or inpatient only procedure)     OTHER Significant initial  Findings:  labs showing:  Cultures:    Component Value Date/Time   SDES  12/27/2022 1334    URINE, CATHETERIZED Performed at [facility], [address] [place], [place] [address]    [facility]  12/27/2022 1334    NONE Performed at [facility], [address] [place], [place] [address]    CULT  12/27/2022 1334    NO GROWTH Performed at [facility], [address], [place], [place] [address]    REPTSTATUS 12/28/2022 FINAL 12/27/2022 1334     Radiological Exams on Admission: CT Angio Chest PE W and/or Wo Contrast  Result Date: 12/30/2022 CLINICAL DATA:  Shortness of breath for 2 weeks EXAM: CT ANGIOGRAPHY CHEST WITH CONTRAST TECHNIQUE: Multidetector CT imaging of the chest was performed using the standard protocol during bolus administration of intravenous contrast. Multiplanar CT image reconstructions and MIPs were obtained to evaluate the vascular anatomy. RADIATION DOSE REDUCTION: This exam was performed according to the departmental dose-optimization  program which includes automated exposure control, adjustment of the mA and/or kV according to patient size and/or use of iterative reconstruction technique. CONTRAST:  75mL OMNIPAQUE IOHEXOL 350 MG/ML SOLN COMPARISON:  None Available. FINDINGS: Cardiovascular: No evidence of pulmonary embolus.Mild cardiomegaly. No pericardial effusion. Normal caliber thoracic aorta with moderate atherosclerotic disease. Mediastinum/Nodes: Small hiatal hernia. Left-sided thyroid nodule measuring 14 mm, no follow-up imaging is necessary. Enlarged lymph nodes seen in the chest. Lungs/Pleura: Central airways are patent. Moderate right and large left pleural effusions with associated atelectasis. Right upper lobe predominant ground-glass opacities and septal thickening. Scattered solid pulmonary nodules. Largest measures 6 mm in the right lower lobe on series 8, image 32. Upper Abdomen: Gallstones. Hyperdense material seen in the gallbladder, likely due to sludge. No acute abnormality. Musculoskeletal: Soft tissue lesion of the medial left chest wall measuring 2.8 x 1.5 cm on series 6, image 38 located between the costochondral cartilage of the left first and second ribs. No aggressive appearing osseous lesions. Review of the MIP images confirms the above findings. IMPRESSION: 1. No evidence of pulmonary embolus. 2. Moderate right and large left pleural effusions with associated atelectasis. 3. Soft tissue lesion of the medial left chest located between the costochondral cartilage of the first and second ribs, concerning for metastatic disease, although benign etiology also possible. Based on goals of care, finding can be further evaluated with soft tissue ultrasound and tissue sampling and/or PET-CT. 4. Right upper lobe predominant ground-glass opacities and septal thickening, likely due to pulmonary edema. 5. Scattered solid pulmonary nodules measuring up to 6 mm. Non-contrast chest CT at 3-6 months is recommended. If the nodules are  stable at time of repeat CT, then future CT at 18-24 months (from today's scan) is considered optional for low-risk patients, but is recommended for high-risk patients. This recommendation follows the consensus statement: Guidelines for Management of Incidental Pulmonary Nodules Detected on CT Images: From the Fleischner Society 2017; Radiology 2017; 284:228-243. 6. Aortic Atherosclerosis (ICD10-I70.0). Electronically Signed   By: Allegra Lai M.D.   On: 12/30/2022 13:40   DG Chest Port 1 View  Result Date: 12/30/2022 CLINICAL DATA:  Shortness of breath for the past 2 weeks. EXAM: PORTABLE CHEST 1 VIEW COMPARISON:  Chest x-ray dated December 24, 2022. FINDINGS: The heart borders are partially obscured but remain mildly enlarged. Interstitial edema in the right lung has improved. Unchanged moderate left and small right pleural effusions with left-greater-than-right basilar atelectasis. No pneumothorax. No acute osseous abnormality. IMPRESSION: 1. Unchanged moderate left  and small right pleural effusions with bibasilar atelectasis. Electronically Signed   By: Obie Dredge M.D.   On: 12/30/2022 11:34   _______________________________________________________________________________________________________ Latest  Blood pressure (!) 93/58, pulse (!) 47, temperature 97.9 F (36.6 C), temperature source Oral, resp. rate 15, SpO2 95 %.   Vitals  labs and radiology finding personally reviewed  Review of Systems:    Pertinent positives include:  chills, fatigue,  shortness of breath at rest.  dyspnea on exertion, Constitutional:  No weight loss, night sweats, Fevers, weight loss  HEENT:  No headaches, Difficulty swallowing,Tooth/dental problems,Sore throat,  No sneezing, itching, ear ache, nasal congestion, post nasal drip,  Cardio-vascular:  No chest pain, Orthopnea, PND, anasarca, dizziness, palpitations.no Bilateral lower extremity swelling  GI:  No heartburn, indigestion, abdominal pain, nausea,  vomiting, diarrhea, change in bowel habits, loss of appetite, melena, blood in stool, hematemesis Resp:  no  No excess mucus, no productive cough, No non-productive cough, No coughing up of blood.No change in color of mucus.No wheezing. Skin:  no rash or lesions. No jaundice GU:  no dysuria, change in color of urine, no urgency or frequency. No straining to urinate.  No flank pain.  Musculoskeletal:  No joint pain or no joint swelling. No decreased range of motion. No back pain.  Psych:  No change in mood or affect. No depression or anxiety. No memory loss.  Neuro: no localizing neurological complaints, no tingling, no weakness, no double vision, no gait abnormality, no slurred speech, no confusion  All systems reviewed and apart from HOPI all are negative _______________________________________________________________________________________________ Past Medical History:   Past Medical History:  Diagnosis Date   Anxiety    High cholesterol    Hypertrophic cardiomyopathy (HCC)    Multiple sclerosis (HCC)    Paroxysmal A-fib (HCC)    Rotator cuff tear    left      Past Surgical History:  Procedure Laterality Date   BLADDER SURGERY  2018   STEM placement    ULNAR NERVE REPAIR Right 2010    Social History:  Ambulatory   wheelchair bound,      reports that she has never smoked. She has never used smokeless tobacco. She reports current alcohol use. She reports that she does not use drugs.     Family History:   Family History  Problem Relation Age of Onset   Heart attack Mother    Transient ischemic attack Mother    Arthritis Mother    Heart Problems Father    ______________________________________________________________________________________________ Allergies: No Known Allergies   Prior to Admission medications   Medication Sig Start Date End Date Taking? Authorizing Provider  amiodarone (PACERONE) 200 MG tablet Take 0.5 tablets (100 mg total) by mouth  daily. 11/20/22   Yates Decamp, MD  AMPYRA 10 MG TB12 Take 1 tablet (10 mg total) by mouth in the morning and at bedtime. BRAND NAME AMPYRA MEDICALLY NECESSARY 10/02/22   Sater, Pearletha Furl, MD  apixaban (ELIQUIS) 2.5 MG TABS tablet Take 1 tablet (2.5 mg total) by mouth 2 (two) times daily. 02/25/22 12/27/22  Cristopher Peru, PA-C  baclofen (LIORESAL) 10 MG tablet TAKE 1/2 TABLET TWICE DAILY 10/09/22   Sater, Pearletha Furl, MD  cephALEXin (KEFLEX) 500 MG capsule Take 1 capsule (500 mg total) by mouth 3 (three) times daily for 7 days. 12/27/22 01/03/23  Tanda Rockers A, DO  clobetasol cream (TEMOVATE) 0.05 % Apply 1 application topically as needed.    [provider]  Cranberry-Vitamin C-Probiotic (AZO CRANBERRY  PO) Take 1 tablet by mouth daily.    [provider]  diclofenac Sodium (VOLTAREN) 1 % GEL Apply 2 g topically as needed (joint pain).    [provider]  estradiol (ESTRING) 2 MG vaginal ring Place 2 mg vaginally every 3 (three) months. follow package directions    [provider]  folic acid (FOLVITE) 1 MG tablet Take 1 mg by mouth daily.    [provider]  gabapentin (NEURONTIN) 300 MG capsule Take 300 mg by mouth 3 (three) times daily.    [provider]  HYDROcodone-acetaminophen (NORCO) 5-325 MG tablet Take 1 tablet by mouth every 8 (eight) hours as needed for up to 3 days for severe pain. 12/27/22 12/30/22  Edwin Dada P, DO  hydrocortisone 2.5 % cream Apply 1 application topically as needed.    [provider]  levothyroxine (SYNTHROID) 25 MCG tablet Take 75 mcg by mouth daily before breakfast.    [provider]  liothyronine (CYTOMEL) 5 MCG tablet Take 5 mcg by mouth in the morning and at bedtime.    [provider]  methenamine (HIPREX) 1 g tablet Take 1 g by mouth daily.    [provider]  methotrexate (RHEUMATREX) 2.5 MG tablet Take 2.5 mg by mouth once a week. Caution:Chemotherapy. Protect from light. 6  tablets weekley    [provider]  mupirocin cream (BACTROBAN) 2 % Apply 1 application topically as needed.    [provider]  nitrofurantoin (MACRODANTIN) 100 MG capsule Take 50 mg by mouth at bedtime.    [provider]  pantoprazole (PROTONIX) 40 MG tablet Take 40 mg by mouth daily. 04/01/22   [provider]  polyethylene glycol (MIRALAX / GLYCOLAX) 17 g packet Take 17 g by mouth daily.    [provider]  Probiotic Product (PROBIOTIC PO) Take 2 tablets by mouth daily.    [provider]  propranolol ER (INDERAL LA) 80 MG 24 hr capsule Take 1 capsule (80 mg total) by mouth daily. 12/11/22   Yates Decamp, MD  rosuvastatin (CRESTOR) 20 MG tablet Take 20 mg by mouth daily. Patient not taking: Reported on 12/24/2022    [provider]  solifenacin (VESICARE) 5 MG tablet Take 1 tablet by mouth daily. 02/21/22   [provider]  triamcinolone cream (KENALOG) 0.1 % Apply 1 application topically as needed.    [provider]  VITAMIN D PO Take 4,000 Units by mouth daily.    [provider]  zolpidem (AMBIEN) 5 MG tablet Take 5 mg by mouth at bedtime.    [provider]    ___________________________________________________________________________________________________ Physical Exam:    12/30/2022    9:41 PM 12/30/2022    8:40 PM 12/30/2022    8:39 PM  Vitals with BMI  Systolic 93 106   Diastolic 58 61   Pulse 47 45 45     1. General:  in No  Acute distress   Chronically ill   -appearing 2. Psychological: Alert and   Oriented 3. Head/ENT:   Moist  Mucous Membranes                          Head Non traumatic, neck supple                         Poor Dentition 4. SKIN: norma Skin turgor,  Skin clean Dry and intact no rash  5. Heart: Regular rate and rhythm systolic  Murmur, no Rub or gallop 6. Lungs:  no wheezes decreased  7. Abdomen: Soft,  non-tender, Non distended   obese  bowel sounds  present 8. Lower extremities: no clubbing, cyanosis, 2+ edema 9. Neurologically generally diminished 10. MSK: Normal range of motion    Chart has been reviewed  ______________________________________________________________________________________________  Assessment/Plan 89 y.o. 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  Status post bladder stimulator unable to get MRI   Admitted for    Bilateral pleural effusion UTI    Present on Admission:  Acute on chronic diastolic CHF (congestive heart failure) (HCC)  Multiple sclerosis (HCC)  Hypothyroidism  Soft tissue mass  Bilateral pleural effusion  Acute respiratory failure with hypoxia (HCC)  UTI (urinary tract infection)  Paroxysmal atrial fibrillation (HCC)  Leg wound, left  Constipation     Multiple sclerosis (HCC) Chronic followed by neurology Family is interested in palliative care consult to further discuss goals of care in the morning   Acute on chronic diastolic CHF (congestive heart failure) (HCC) Patient is followed by Dr. Jacinto Halim. Case discussed with Dr. Odis Hollingshead Patient already received a dose of Lasix in the emergency department blood pressure now soft we will hold off on further diuresis for now Repeat echogram appreciate cardiology consult  Hypothyroidism Check TSH continue Synthroid at 75 mcg a day patient is also on Cytomel 5 mcg a day  Soft tissue mass CT showing evidence of soft tissue mass.  Discussed with family and patient.  At this point will need further discussion in a.m. regarding overall goals of care and the family and patient would like to pursue this.  Bilateral pleural effusion Discussed with family if patient would be agreeable for thoracentesis.  At this time would like to discuss this and think about it for tomorrow. Blood pressure is somewhat soft after first  dose of Lasix we will hold off on further diuresis patient is currently comfortable  Acute respiratory failure with hypoxia (HCC) In the setting of pleural effusions bilaterally. Continue O2 as needed  UTI (urinary tract infection) UTI will obtain urine culture.  Noticed some yeast will start Diflucan as well start Rocephin  Paroxysmal atrial fibrillation (HCC) Noted 40 cardio hold off on propranolol today. Continue amiodarone at 100 mg a day. It hold Eliquis given hematuria  Leg wound, left Will  order wound consult  Constipation Bowel regimen    Other plan as per orders.  DVT prophylaxis:  SCD      Code Status:   DNR/DNI as per patient   I had personally discussed CODE STATUS with patient and family  ACP none   Family Communication:   Family not at  Bedside  plan of care was discussed on the phone with   Daughter,   Diet  heart healthy   Disposition Plan:        To home once workup is complete and patient is stable   Following barriers for discharge:                                                      Will need to be able to tolerate PO  Will likely need home health, home O2, set up                           Will need consultants to evaluate patient prior to discharge       Consult Orders  (From admission, onward)           Start     Ordered   12/30/22 1420  Consult to hospitalist  Called [facility] for call to Hospitalist @2 :33pm.  Maisie Fus to schduled after call to [facility].  Once       Provider:  (Not yet assigned)  Question Answer Comment  Place call to: [facility]   Reason for Consult Admit      12/30/22 1419                               Would benefit from PT/OT eval prior to DC  Ordered                                       Palliative care    consulted                    Consults called: Cardiology is aware    Admission status:  ED Disposition     ED Disposition  Admit   Condition  --    Comment  Hospital Area: [facility] [[id number]]  Level of Care: Telemetry Medical [[id number]]  May admit patient to [facility] or [facility] if equivalent level of care is available:: No  Interfacility transfer: Yes  Covid Evaluation: Asymptomatic - no recent exposure (last 10 days) testing not required  Diagnosis: CHF (congestive heart failure) [facility]) [[id number]]  Admitting Physician: Leroy Sea [[id number]]  Attending Physician: Leroy Sea [[id number]]  Bed request comments: 5W  Certification:: I certify there are rare and unusual circumstances requiring inpatient admission  Estimated Length of Stay: 2            inpatient     I Expect 2 midnight stay secondary to severity of patient's current illness need for inpatient interventions justified by the following:  hemodynamic instability despite optimal treatment ( hypoxia,  )   Severe lab/radiological/exam abnormalities including:   Bilateral pleural effusion and extensive comorbidities including:    history of MS    malignancy,   Chronic anticoagulation  That are currently affecting medical management.   I expect  patient to be hospitalized for 2 midnights requiring inpatient medical care.  Patient is at high risk for adverse outcome (such as loss of life or disability) if not treated.  Indication for inpatient stay as follows:    New or worsening hypoxia   Need for  IV diuretics IV abx    Level of care     tele  For  24H      Prabhjot Piscitello 12/30/2022, 11:40 PM    [facility]     after 2 AM please page floor coverage PA If 7AM-7PM, please contact the day team taking care of the patient using [email or web address]

## 2022-12-30 NOTE — Assessment & Plan Note (Signed)
Tina Webb  order wound consult

## 2022-12-30 NOTE — ED Notes (Signed)
Repositioned patient with pillow wedge support (R) side

## 2022-12-30 NOTE — Assessment & Plan Note (Signed)
UTI will obtain urine culture.  Noticed some yeast will start Diflucan as well start Rocephin

## 2022-12-30 NOTE — Assessment & Plan Note (Signed)
Check TSH continue Synthroid at 75 mcg a day patient is also on Cytomel 5 mcg a day

## 2022-12-30 NOTE — Assessment & Plan Note (Addendum)
Chronic followed by neurology Family is interested in palliative care consult to further discuss goals of care in the morning

## 2022-12-30 NOTE — ED Triage Notes (Signed)
C/o shortness of breath x 2 weeks, recently seen here for blood in urine, decreasing amount of blood per patient. States breathing has not worsened since she was last here.  Had Xray last week and was told by PCP to come as it showed pleural effusion.

## 2022-12-30 NOTE — Plan of Care (Signed)
Tina Webb, is a 89 y.o. female, DOB - 1934/02/17, ION:[id number]  With H/O MS, wheelchair bound, Afib, Eliquis was recently stopped for Hematuria - 21st of this mth, follows with Dr Jacinto Halim, coming for exertional SOB-CHF, ?? UA, Dr Odis Hollingshead to see, got Lasix at [facility], stable.   Vitals:   12/30/22 1059 12/30/22 1158 12/30/22 1230 12/30/22 1348  BP:  127/71 (!) 130/55 (!) 141/47  Pulse:  (!) 48 (!) 49 (!) 49  Resp: (!) 22 (!) 21 (!) 21 (!) 21  Temp:  97.8 F (36.6 C)    TempSrc:      SpO2: 100% 94% 93% 93%        Data Review   Micro Results Recent Results (from the past 240 hour(s))  Urine Culture     Status: None   Collection Time: 12/27/22  1:34 PM   Specimen: Urine, Catheterized  Result Value Ref Range Status   Specimen Description   Final    URINE, CATHETERIZED Performed at [facility], [address] [place], [place] [address]    Special Requests   Final    NONE Performed at [facility], [address] [place], [place] [address]    Culture   Final    NO GROWTH Performed at [facility], [address] [place], [place] [address]    Report Status 12/28/2022 FINAL  Final    Radiology Reports CT Angio Chest PE W and/or Wo Contrast  Result Date: 12/30/2022 CLINICAL DATA:  Shortness of breath for 2 weeks EXAM: CT ANGIOGRAPHY CHEST WITH CONTRAST TECHNIQUE: Multidetector CT imaging of the chest was performed using the standard protocol during bolus administration of intravenous contrast. Multiplanar CT image reconstructions and MIPs were obtained to evaluate the vascular anatomy. RADIATION DOSE REDUCTION: This exam was performed according to the departmental dose-optimization program which includes automated exposure control, adjustment of the mA and/or kV according to patient size and/or use of iterative reconstruction technique. CONTRAST:  75mL OMNIPAQUE  IOHEXOL 350 MG/ML SOLN COMPARISON:  None Available. FINDINGS: Cardiovascular: No evidence of pulmonary embolus.Mild cardiomegaly. No pericardial effusion. Normal caliber thoracic aorta with moderate atherosclerotic disease. Mediastinum/Nodes: Small hiatal hernia. Left-sided thyroid nodule measuring 14 mm, no follow-up imaging is necessary. Enlarged lymph nodes seen in the chest. Lungs/Pleura: Central airways are patent. Moderate right and large left pleural effusions with associated atelectasis. Right upper lobe predominant ground-glass opacities and septal thickening. Scattered solid pulmonary nodules. Largest measures 6 mm in the right lower lobe on series 8, image 32. Upper Abdomen: Gallstones. Hyperdense material seen in the gallbladder, likely due to sludge. No acute abnormality. Musculoskeletal: Soft tissue lesion of the medial left chest wall measuring 2.8 x 1.5 cm on series 6, image 38 located between the costochondral cartilage of the left first and second ribs. No aggressive appearing osseous lesions. Review of the MIP images confirms the above findings. IMPRESSION: 1. No evidence of pulmonary embolus. 2. Moderate right and large left pleural effusions with associated atelectasis. 3. Soft tissue lesion of the medial left chest located between the costochondral cartilage of the first and second ribs, concerning for metastatic disease, although benign etiology also possible. Based on goals of care, finding can be further evaluated with soft tissue ultrasound and tissue sampling and/or PET-CT. 4. Right upper lobe predominant ground-glass opacities and septal thickening, likely due to pulmonary edema. 5. Scattered solid pulmonary nodules measuring up to 6 mm. Non-contrast chest CT at 3-6 months is recommended.  If the nodules are stable at time of repeat CT, then future CT at 18-24 months (from today's scan) is considered optional for low-risk patients, but is recommended for high-risk patients. This  recommendation follows the consensus statement: Guidelines for Management of Incidental Pulmonary Nodules Detected on CT Images: From the Fleischner Society 2017; Radiology 2017; 284:228-243. 6. Aortic Atherosclerosis (ICD10-I70.0). Electronically Signed   By: Allegra Lai M.D.   On: 12/30/2022 13:40   DG Chest Port 1 View  Result Date: 12/30/2022 CLINICAL DATA:  Shortness of breath for the past 2 weeks. EXAM: PORTABLE CHEST 1 VIEW COMPARISON:  Chest x-ray dated December 24, 2022. FINDINGS: The heart borders are partially obscured but remain mildly enlarged. Interstitial edema in the right lung has improved. Unchanged moderate left and small right pleural effusions with left-greater-than-right basilar atelectasis. No pneumothorax. No acute osseous abnormality. IMPRESSION: 1. Unchanged moderate left and small right pleural effusions with bibasilar atelectasis. Electronically Signed   By: Obie Dredge M.D.   On: 12/30/2022 11:34   DG Chest 2 View  Result Date: 12/29/2022 CLINICAL DATA:  Shortness of breath. EXAM: CHEST - 2 VIEW COMPARISON:  None Available. FINDINGS: There is mild to moderate severity enlargement of the cardiac silhouette. Low lung volumes are seen with moderate severity diffusely increased interstitial lung markings. Mild superimposed right upper lobe and right infrahilar airspace disease is seen. Marked severity consolidation is seen within the left lung base. A moderate to large left pleural effusion is also noted. No pneumothorax is identified. There is anterior dislocation of the left shoulder of indeterminate age. Multilevel degenerative changes are seen throughout the thoracic spine. IMPRESSION: 1. Cardiomegaly with moderate severity interstitial edema and mild superimposed right upper lobe and right infrahilar airspace disease. 2. Marked severity left basilar consolidation with a moderate to large left pleural effusion. Follow-up to resolution is recommended, as sequelae associated  with an underlying neoplastic process cannot be excluded. 3. Anterior dislocation of the left shoulder of indeterminate age. Electronically Signed   By: Aram Candela M.D.   On: 12/29/2022 03:23   CT ABDOMEN PELVIS W CONTRAST  Result Date: 12/27/2022 CLINICAL DATA:  Gross hematuria, vaginal bleeding, incontinence EXAM: CT ABDOMEN AND PELVIS WITH CONTRAST TECHNIQUE: Multidetector CT imaging of the abdomen and pelvis was performed using the standard protocol following bolus administration of intravenous contrast. RADIATION DOSE REDUCTION: This exam was performed according to the departmental dose-optimization program which includes automated exposure control, adjustment of the mA and/or kV according to patient size and/or use of iterative reconstruction technique. CONTRAST:  OMNIPAQUE IOHEXOL 300 MG/ML  SOLN COMPARISON:  08/02/2021 FINDINGS: Lower chest: Large left, moderate right pleural effusions. Cardiomegaly. Hepatobiliary: No solid liver abnormality is seen. Sludge and small gallstones. No gallbladder wall thickening, or biliary dilatation. Pancreas: Unremarkable. No pancreatic ductal dilatation or surrounding inflammatory changes. Spleen: Normal in size without significant abnormality. Adrenals/Urinary Tract: Adrenal glands are unremarkable. Kidneys are normal, without renal calculi, solid lesion, or hydronephrosis. Severely diverticular urinary bladder, similar to prior examination (series 3, image 67). Stomach/Bowel: Stomach is within normal limits. Appendix not clearly visualized. No evidence of bowel wall thickening, distention, or inflammatory changes. Large burden of stool and stool balls throughout the colon and rectum. Vascular/Lymphatic: Aortic atherosclerosis. No enlarged abdominal or pelvic lymph nodes. Reproductive: No mass or other significant abnormality. Other: Fat containing umbilical hernia.  No ascites. Musculoskeletal: No acute or significant osseous findings. IMPRESSION: 1.  Severely diverticular urinary bladder, similar to prior examination. 2. No evidence of urinary  tract calculus or hydronephrosis. 3. Large burden of stool and stool balls throughout the colon and rectum. 4. Large left, moderate right pleural effusions. 5. Cardiomegaly. Aortic Atherosclerosis (ICD10-I70.0). Electronically Signed   By: Jearld Lesch M.D.   On: 12/27/2022 11:45    CBC Recent Labs  Lab 12/27/22 0943 12/30/22 1101  WBC 9.7 10.7*  HGB 11.8* 13.4  HCT 37.9 41.9  PLT 367 452*  MCV 92.4 90.3  MCH 28.8 28.9  MCHC 31.1 32.0  RDW 18.1* 18.1*  LYMPHSABS 0.5* 0.5*  MONOABS 0.7 0.8  EOSABS 0.2 0.2  BASOSABS 0.0 0.0    Chemistries  Recent Labs  Lab 12/27/22 0943 12/30/22 1101  NA 138 142  K 3.9 3.9  CL 105 108  CO2 23 24  GLUCOSE 103* 92  BUN 48* 29*  CREATININE 0.61 0.63  CALCIUM 8.6* 9.5  AST 24  --   ALT 25  --   ALKPHOS 53  --   BILITOT 0.7  --      Coagulation profile Recent Labs  Lab 12/27/22 0943  INR 1.2      Signature  Susa Raring M.D on 12/30/2022 at 3:16 PM   -  To page go to [email or web address]

## 2022-12-30 NOTE — Telephone Encounter (Signed)
Faxed signed order back to [employer] re: power mobility device (PWC or scooter) evaluation. Fax: [phone]. Received fax confirmation.

## 2022-12-30 NOTE — Assessment & Plan Note (Addendum)
Patient is followed by Dr. Jacinto Halim. Case discussed with Dr. Odis Hollingshead Patient already received a dose of Lasix in the emergency department blood pressure now soft we will hold off on further diuresis for now Repeat echogram appreciate cardiology consult

## 2022-12-30 NOTE — Assessment & Plan Note (Signed)
Discussed with family if patient would be agreeable for thoracentesis.  At this time would like to discuss this and think about it for tomorrow. Blood pressure is somewhat soft after first dose of Lasix we will hold off on further diuresis patient is currently comfortable

## 2022-12-30 NOTE — Assessment & Plan Note (Signed)
CT showing evidence of soft tissue mass.  Discussed with family and patient.  At this point will need further discussion in a.m. regarding overall goals of care and the family and patient would like to pursue this.

## 2022-12-30 NOTE — ED Notes (Signed)
[facility] at bedside 

## 2022-12-30 NOTE — Progress Notes (Signed)
Pharmacy Antibiotic Note  Tina Webb is a 89 y.o. female admitted on 12/30/2022 with UTI.  Pharmacy has been consulted for Diflucan dosing.  Plan: Diflucan 100mg  now then 50mg  daily x 4 days Pharmacy will sign off - please reconsult if needed     Temp (24hrs), Avg:97.8 F (36.6 C), Min:97.8 F (36.6 C), Max:97.9 F (36.6 C)  Recent Labs  Lab 12/27/22 0943 12/30/22 1101  WBC 9.7 10.7*  CREATININE 0.61 0.63    Estimated Creatinine Clearance: 39.9 mL/min (by C-G formula based on SCr of 0.63 mg/dL).    No Known Allergies  Thank you for allowing pharmacy to be a part of this patient's care.  Christoper Fabian, PharmD, BCPS Please see amion for complete clinical pharmacist phone list 12/30/2022 10:23 PM

## 2022-12-30 NOTE — ED Notes (Signed)
Recliner for family members comfort

## 2022-12-30 NOTE — Subjective & Objective (Signed)
Pt with known hx of MS wheelchair-bound at baseline and known history of hypertrophic cardiomyopathy In the past was taking Eliquis for atrial fibrillation but had to be stopped on 21 June secondary to hemorrhagic cystitis Patient was treated with antibiotics Keflex Now she has been short of breath for past few days chest x-ray was done by neurology that showed bilateral pleural effusions she was sent to emergency department for further evaluation She reports that she is more short of breath with exertion and at night when lays down flat but okay when she is just sitting up No fever

## 2022-12-30 NOTE — ED Notes (Signed)
Lab notified to add-on magnesium to previously collected blood  sample.   

## 2022-12-30 NOTE — Assessment & Plan Note (Signed)
Bowel regimen ?

## 2022-12-30 NOTE — Assessment & Plan Note (Signed)
In the setting of pleural effusions bilaterally. Continue O2 as needed

## 2022-12-30 NOTE — ED Notes (Signed)
RT assessed in triage. Husband stated that she was diagnosed with a left sided PE. SAT 100%, more diminished on left side but clear.

## 2022-12-30 NOTE — ED Provider Notes (Signed)
EMERGENCY DEPARTMENT AT [facility] Provider Note   CSN: [id number] Arrival date & time: 12/30/22  1048     History  Chief Complaint  Patient presents with   Shortness of Breath    Tina Webb is a 89 y.o. female.  Pt is a 89 yo female with pmhx significant for MS (wheelchair bound), hld, anxiety, afib, hypertrophic cm.  Pt was on Eliquis for afib, but was taken off it on 6/21 when she was here for hemorrhagic cystitis.  UA looked +, so she was given rocephin and put on keflex.  She has been sob for several days.  Her neurologist noticed this and ordered a CXR.  CXR showed bilateral pleural effusions.  Pleural effusion was seen on CT of pt's abd on 6/21, but pt said she was not told of it.  Pt told to come to the ED for further eval of the effusions.  Pt said she is sob with any kind of exertion and at night.  She feels ok when she is just sitting.  Pt said she still has brown urine in her fc.  No fever.       Home Medications Prior to Admission medications   Medication Sig Start Date End Date Taking? Authorizing Provider  amiodarone (PACERONE) 200 MG tablet Take 0.5 tablets (100 mg total) by mouth daily. 11/20/22   Yates Decamp, MD  AMPYRA 10 MG TB12 Take 1 tablet (10 mg total) by mouth in the morning and at bedtime. BRAND NAME AMPYRA MEDICALLY NECESSARY 10/02/22   Sater, Pearletha Furl, MD  apixaban (ELIQUIS) 2.5 MG TABS tablet Take 1 tablet (2.5 mg total) by mouth 2 (two) times daily. 02/25/22 12/27/22  Cristopher Peru, PA-C  baclofen (LIORESAL) 10 MG tablet TAKE 1/2 TABLET TWICE DAILY 10/09/22   Sater, Pearletha Furl, MD  cephALEXin (KEFLEX) 500 MG capsule Take 1 capsule (500 mg total) by mouth 3 (three) times daily for 7 days. 12/27/22 01/03/23  Tanda Rockers A, DO  clobetasol cream (TEMOVATE) 0.05 % Apply 1 application topically as needed.    [provider]  Cranberry-Vitamin C-Probiotic (AZO CRANBERRY PO) Take 1 tablet by mouth daily.    [provider]  diclofenac Sodium (VOLTAREN) 1 % GEL Apply 2 g topically as needed (joint pain).    [provider]  estradiol (ESTRING) 2 MG vaginal ring Place 2 mg vaginally every 3 (three) months. follow package directions    [provider]  folic acid (FOLVITE) 1 MG tablet Take 1 mg by mouth daily.    [provider]  gabapentin (NEURONTIN) 300 MG capsule Take 300 mg by mouth 3 (three) times daily.    [provider]  HYDROcodone-acetaminophen (NORCO) 5-325 MG tablet Take 1 tablet by mouth every 8 (eight) hours as needed for up to 3 days for severe pain. 12/27/22 12/30/22  Edwin Dada P, DO  hydrocortisone 2.5 % cream Apply 1 application topically as needed.    [provider]  levothyroxine (SYNTHROID) 25 MCG tablet Take 75 mcg by mouth daily before breakfast.    [provider]  liothyronine (CYTOMEL) 5 MCG tablet Take 5 mcg by mouth in the morning and at bedtime.    [provider]  methenamine (HIPREX) 1 g tablet Take 1 g by mouth daily.    [provider]  methotrexate (RHEUMATREX) 2.5 MG tablet Take 2.5 mg by mouth once a week. Caution:Chemotherapy. Protect from light. 6 tablets weekley  [provider]  mupirocin cream (BACTROBAN) 2 % Apply 1 application topically as needed.    [provider]  nitrofurantoin (MACRODANTIN) 100 MG capsule Take 50 mg by mouth at bedtime.    [provider]  pantoprazole (PROTONIX) 40 MG tablet Take 40 mg by mouth daily. 04/01/22   [provider]  polyethylene glycol (MIRALAX / GLYCOLAX) 17 g packet Take 17 g by mouth daily.    [provider]  Probiotic Product (PROBIOTIC PO) Take 2 tablets by mouth daily.    [provider]  propranolol ER (INDERAL LA) 80 MG 24 hr capsule Take 1 capsule (80 mg total) by mouth daily. 12/11/22   Yates Decamp, MD  rosuvastatin (CRESTOR) 20 MG tablet Take 20 mg by mouth daily. Patient not taking: Reported on  12/24/2022    [provider]  solifenacin (VESICARE) 5 MG tablet Take 1 tablet by mouth daily. 02/21/22   [provider]  triamcinolone cream (KENALOG) 0.1 % Apply 1 application topically as needed.    [provider]  VITAMIN D PO Take 4,000 Units by mouth daily.    [provider]  zolpidem (AMBIEN) 5 MG tablet Take 5 mg by mouth at bedtime.    [provider]      Allergies    Patient has no known allergies.    Review of Systems   Review of Systems  Respiratory:  Positive for shortness of breath.   Genitourinary:        Manson Passey urine  All other systems reviewed and are negative.   Physical Exam Updated Vital Signs BP (!) 141/47   Pulse (!) 49   Temp 97.8 F (36.6 C)   Resp (!) 21   SpO2 93%  Physical Exam Vitals and nursing note reviewed.  Constitutional:      Appearance: She is well-developed.  HENT:     Head: Normocephalic and atraumatic.     Mouth/Throat:     Mouth: Mucous membranes are moist.     Pharynx: Oropharynx is clear.  Eyes:     Extraocular Movements: Extraocular movements intact.     Pupils: Pupils are equal, round, and reactive to light.  Cardiovascular:     Rate and Rhythm: Regular rhythm. Bradycardia present.     Heart sounds: Murmur heard.  Pulmonary:     Effort: Pulmonary effort is normal.     Breath sounds: Rhonchi present.  Abdominal:     General: Bowel sounds are normal.     Palpations: Abdomen is soft.  Musculoskeletal:     Cervical back: Normal range of motion and neck supple.  Skin:    General: Skin is warm.     Capillary Refill: Capillary refill takes less than 2 seconds.  Neurological:     Mental Status: She is alert and oriented to person, place, and time.     Motor: Tremor present.     Comments: Diffuse weakness  Psychiatric:        Mood and Affect: Mood normal.        Behavior: Behavior normal.     ED Results / Procedures / Treatments   Labs (all labs ordered are listed, but only  abnormal results are displayed) Labs Reviewed  BASIC METABOLIC PANEL - Abnormal; Notable for the following components:      Result Value   BUN 29 (*)    All other components within normal limits  CBC WITH DIFFERENTIAL/PLATELET - Abnormal; Notable for the following components:  WBC 10.7 (*)    RDW 18.1 (*)    Platelets 452 (*)    Neutro Abs 9.1 (*)    Lymphs Abs 0.5 (*)    All other components within normal limits  BRAIN NATRIURETIC PEPTIDE - Abnormal; Notable for the following components:   B Natriuretic Peptide 1,387.2 (*)    All other components within normal limits  URINALYSIS, ROUTINE W REFLEX MICROSCOPIC - Abnormal; Notable for the following components:   Color, Urine BROWN (*)    APPearance CLOUDY (*)    Hgb urine dipstick LARGE (*)    Bilirubin Urine SMALL (*)    Protein, ur 100 (*)    Leukocytes,Ua SMALL (*)    All other components within normal limits  URINALYSIS, MICROSCOPIC (REFLEX) - Abnormal; Notable for the following components:   Bacteria, UA MANY (*)    All other components within normal limits    EKG EKG Interpretation  Date/Time:  Monday December 30 2022 11:01:34 EDT Ventricular Rate:  51 PR Interval:  189 QRS Duration: 165 QT Interval:  592 QTC Calculation: 546 R Axis:   -3 Text Interpretation: Sinus rhythm Ventricular premature complex Left bundle branch block Baseline wander in lead(s) V6 No significant change since last tracing Confirmed by Jacalyn Lefevre [phone]) on 12/30/2022 11:06:45 AM  Radiology CT Angio Chest PE W and/or Wo Contrast  Result Date: 12/30/2022 CLINICAL DATA:  Shortness of breath for 2 weeks EXAM: CT ANGIOGRAPHY CHEST WITH CONTRAST TECHNIQUE: Multidetector CT imaging of the chest was performed using the standard protocol during bolus administration of intravenous contrast. Multiplanar CT image reconstructions and MIPs were obtained to evaluate the vascular anatomy. RADIATION DOSE REDUCTION: This exam was performed according to the  departmental dose-optimization program which includes automated exposure control, adjustment of the mA and/or kV according to patient size and/or use of iterative reconstruction technique. CONTRAST:  75mL OMNIPAQUE IOHEXOL 350 MG/ML SOLN COMPARISON:  None Available. FINDINGS: Cardiovascular: No evidence of pulmonary embolus.Mild cardiomegaly. No pericardial effusion. Normal caliber thoracic aorta with moderate atherosclerotic disease. Mediastinum/Nodes: Small hiatal hernia. Left-sided thyroid nodule measuring 14 mm, no follow-up imaging is necessary. Enlarged lymph nodes seen in the chest. Lungs/Pleura: Central airways are patent. Moderate right and large left pleural effusions with associated atelectasis. Right upper lobe predominant ground-glass opacities and septal thickening. Scattered solid pulmonary nodules. Largest measures 6 mm in the right lower lobe on series 8, image 32. Upper Abdomen: Gallstones. Hyperdense material seen in the gallbladder, likely due to sludge. No acute abnormality. Musculoskeletal: Soft tissue lesion of the medial left chest wall measuring 2.8 x 1.5 cm on series 6, image 38 located between the costochondral cartilage of the left first and second ribs. No aggressive appearing osseous lesions. Review of the MIP images confirms the above findings. IMPRESSION: 1. No evidence of pulmonary embolus. 2. Moderate right and large left pleural effusions with associated atelectasis. 3. Soft tissue lesion of the medial left chest located between the costochondral cartilage of the first and second ribs, concerning for metastatic disease, although benign etiology also possible. Based on goals of care, finding can be further evaluated with soft tissue ultrasound and tissue sampling and/or PET-CT. 4. Right upper lobe predominant ground-glass opacities and septal thickening, likely due to pulmonary edema. 5. Scattered solid pulmonary nodules measuring up to 6 mm. Non-contrast chest CT at 3-6 months is  recommended. If the nodules are stable at time of repeat CT, then future CT at 18-24 months (from today's scan) is considered optional for low-risk patients,  but is recommended for high-risk patients. This recommendation follows the consensus statement: Guidelines for Management of Incidental Pulmonary Nodules Detected on CT Images: From the Fleischner Society 2017; Radiology 2017; 284:228-243. 6. Aortic Atherosclerosis (ICD10-I70.0). Electronically Signed   By: Allegra Lai M.D.   On: 12/30/2022 13:40   DG Chest Port 1 View  Result Date: 12/30/2022 CLINICAL DATA:  Shortness of breath for the past 2 weeks. EXAM: PORTABLE CHEST 1 VIEW COMPARISON:  Chest x-ray dated December 24, 2022. FINDINGS: The heart borders are partially obscured but remain mildly enlarged. Interstitial edema in the right lung has improved. Unchanged moderate left and small right pleural effusions with left-greater-than-right basilar atelectasis. No pneumothorax. No acute osseous abnormality. IMPRESSION: 1. Unchanged moderate left and small right pleural effusions with bibasilar atelectasis. Electronically Signed   By: Obie Dredge M.D.   On: 12/30/2022 11:34    Procedures Procedures    Medications Ordered in ED Medications  iohexol (OMNIPAQUE) 350 MG/ML injection 100 mL (75 mLs Intravenous Contrast Given 12/30/22 1247)  furosemide (LASIX) injection 40 mg (40 mg Intravenous Given 12/30/22 1428)    ED Course/ Medical Decision Making/ A&P                             Medical Decision Making Amount and/or Complexity of Data Reviewed Labs: ordered. Radiology: ordered.  Risk Prescription drug management. Decision regarding hospitalization.   This patient presents to the ED for concern of sob, this involves an extensive number of treatment options, and is a complaint that carries with it a high risk of complications and morbidity.  The differential diagnosis includes pleural effusion, PE, pna   Co morbidities that  complicate the patient evaluation   MS (wheelchair bound), hld, anxiety, afib, hypertrophic cm   Additional history obtained:  Additional history obtained from epic chart review External records from outside source obtained and reviewed including husband   Lab Tests:  I Ordered, and personally interpreted labs.  The pertinent results include:  cbc with wbc elevated at 10.7, bmp nl other than bun elevated at 29, ua with +hgb; urine culture from 6/21 had no growth   Imaging Studies ordered:  I ordered imaging studies including cxr and ct chest  I independently visualized and interpreted imaging which showed  CXR: Unchanged moderate left and small right pleural effusions with  bibasilar atelectasis.   CT angio chest: No evidence of pulmonary embolus.  2. Moderate right and large left pleural effusions with associated  atelectasis.  3. Soft tissue lesion of the medial left chest located between the  costochondral cartilage of the first and second ribs, concerning for  metastatic disease, although benign etiology also possible. Based on  goals of care, finding can be further evaluated with soft tissue  ultrasound and tissue sampling and/or PET-CT.  4. Right upper lobe predominant ground-glass opacities and septal  thickening, likely due to pulmonary edema.  5. Scattered solid pulmonary nodules measuring up to 6 mm.  Non-contrast chest CT at 3-6 months is recommended. If the nodules  are stable at time of repeat CT, then future CT at 18-24 months  (from today's scan) is considered optional for low-risk patients,  but is recommended for high-risk patients. This recommendation  follows the consensus statement: Guidelines for Management of  Incidental Pulmonary Nodules Detected on CT Images: From the  Fleischner Society 2017; Radiology 2017; 284:228-243.  6. Aortic Atherosclerosis (ICD10-I70.0).   I agree with the radiologist interpretation  Cardiac Monitoring:  The patient  was maintained on a cardiac monitor.  I personally viewed and interpreted the cardiac monitored which showed an underlying rhythm of: sb   Medicines ordered and prescription drug management:  I ordered medication including lasix  for chf  Reevaluation of the patient after these medicines showed that the patient improved I have reviewed the patients home medicines and have made adjustments as needed   Test Considered:  echo   Critical Interventions:  lasix   Consultations Obtained:  I requested consultation with the cardiologist (Dr. Odis Hollingshead),  and discussed lab and imaging findings as well as pertinent plan -he will consult.  Requests [facility]. Pt d/w Dr. Thedore Mins ([facility]) for admission.   Problem List / ED Course:  CHF with pleural effusions:  pt is not on diuretics.  She is started on lasix.  It looks like Dr. Jacinto Halim ordered an ECHO in December, but it was never done.  She was also supposed to f/u in March, but did not.  Pt does have HOCM and had no evidence of chf when she was seen in December. Hematuria:  no uti on culture; pt has been off DOAC since 6/21. I am going to hold off on abx for now as she's afebrile and has no dysuria.  Reevaluation:  After the interventions noted above, I reevaluated the patient and found that they have :improved   Social Determinants of Health:  Lives at home.  Husband takes very good care of her.   Dispostion:  After consideration of the diagnostic results and the patients response to treatment, I feel that the patent would benefit from admission.          Final Clinical Impression(s) / ED Diagnoses Final diagnoses:  MS (multiple sclerosis) (HCC)  Bilateral pleural effusion  Acute pulmonary edema (HCC)  Gross hematuria    Rx / DC Orders ED Discharge Orders     None         Jacalyn Lefevre, MD 12/30/22 1520

## 2022-12-30 NOTE — Assessment & Plan Note (Signed)
Noted 40 cardio hold off on propranolol today. Continue amiodarone at 100 mg a day. It hold Eliquis given hematuria

## 2022-12-31 ENCOUNTER — Inpatient Hospital Stay (HOSPITAL_COMMUNITY): Payer: Medicare Other

## 2022-12-31 DIAGNOSIS — I5031 Acute diastolic (congestive) heart failure: Secondary | ICD-10-CM | POA: Diagnosis not present

## 2022-12-31 DIAGNOSIS — I5033 Acute on chronic diastolic (congestive) heart failure: Secondary | ICD-10-CM | POA: Diagnosis not present

## 2022-12-31 LAB — CBC
HCT: 36.5 % (ref 36.0–46.0)
Hemoglobin: 11.9 g/dL — ABNORMAL LOW (ref 12.0–15.0)
MCH: 29.2 pg (ref 26.0–34.0)
MCHC: 32.6 g/dL (ref 30.0–36.0)
MCV: 89.7 fL (ref 80.0–100.0)
Platelets: 435 10*3/uL — ABNORMAL HIGH (ref 150–400)
RBC: 4.07 MIL/uL (ref 3.87–5.11)
RDW: 17.7 % — ABNORMAL HIGH (ref 11.5–15.5)
WBC: 8.9 10*3/uL (ref 4.0–10.5)
nRBC: 0 % (ref 0.0–0.2)

## 2022-12-31 LAB — ECHOCARDIOGRAM COMPLETE
AR max vel: 1.71 cm2
AV Area VTI: 2.17 cm2
AV Area mean vel: 1.84 cm2
AV Mean grad: 53 mmHg
AV Peak grad: 81.7 mmHg
Ao pk vel: 4.52 m/s
Area-P 1/2: 1.96 cm2
Calc EF: 48.8 %
MV M vel: 7.88 m/s
MV Peak grad: 248.4 mmHg
MV VTI: 2.7 cm2
S' Lateral: 2.8 cm
Single Plane A2C EF: 46.1 %
Single Plane A4C EF: 53.8 %

## 2022-12-31 LAB — COMPREHENSIVE METABOLIC PANEL
ALT: 17 U/L (ref 0–44)
ALT: 16 U/L (ref 0–44)
AST: 16 U/L (ref 15–41)
AST: 16 U/L (ref 15–41)
Albumin: 2.8 g/dL — ABNORMAL LOW (ref 3.5–5.0)
Albumin: 2.7 g/dL — ABNORMAL LOW (ref 3.5–5.0)
Alkaline Phosphatase: 45 U/L (ref 38–126)
Alkaline Phosphatase: 44 U/L (ref 38–126)
Anion gap: 14 (ref 5–15)
Anion gap: 8 (ref 5–15)
BUN: 24 mg/dL — ABNORMAL HIGH (ref 8–23)
BUN: 22 mg/dL (ref 8–23)
CO2: 21 mmol/L — ABNORMAL LOW (ref 22–32)
CO2: 22 mmol/L (ref 22–32)
Calcium: 8.7 mg/dL — ABNORMAL LOW (ref 8.9–10.3)
Calcium: 8.4 mg/dL — ABNORMAL LOW (ref 8.9–10.3)
Chloride: 106 mmol/L (ref 98–111)
Chloride: 109 mmol/L (ref 98–111)
Creatinine, Ser: 0.59 mg/dL (ref 0.44–1.00)
Creatinine, Ser: 0.61 mg/dL (ref 0.44–1.00)
GFR, Estimated: >60 mL/min (ref >60)
GFR, Estimated: >60 mL/min (ref >60)
Glucose, Bld: 100 mg/dL — ABNORMAL HIGH (ref 70–99)
Glucose, Bld: 100 mg/dL — ABNORMAL HIGH (ref 70–99)
Potassium: 3.7 mmol/L (ref 3.5–5.1)
Potassium: 3.5 mmol/L (ref 3.5–5.1)
Sodium: 141 mmol/L (ref 135–145)
Sodium: 139 mmol/L (ref 135–145)
Total Bilirubin: 0.6 mg/dL (ref 0.3–1.2)
Total Bilirubin: 0.5 mg/dL (ref 0.3–1.2)
Total Protein: 5.4 g/dL — ABNORMAL LOW (ref 6.5–8.1)
Total Protein: 5.1 g/dL — ABNORMAL LOW (ref 6.5–8.1)

## 2022-12-31 LAB — T4, FREE: Free T4: 1.18 ng/dL — ABNORMAL HIGH (ref 0.61–1.12)

## 2022-12-31 LAB — TROPONIN I (HIGH SENSITIVITY)
Troponin I (High Sensitivity): 11 ng/L (ref <18)
Troponin I (High Sensitivity): 10 ng/L (ref <18)

## 2022-12-31 LAB — MAGNESIUM: Magnesium: 2.1 mg/dL (ref 1.7–2.4)

## 2022-12-31 LAB — PROCALCITONIN: Procalcitonin: <0.1 ng/mL

## 2022-12-31 LAB — PHOSPHORUS: Phosphorus: 4.9 mg/dL — ABNORMAL HIGH (ref 2.5–4.6)

## 2022-12-31 MED ORDER — AMIODARONE HCL 200 MG PO TABS: 100.0000 mg | ORAL_TABLET | Freq: Every day | ORAL | Status: DC

## 2022-12-31 MED ORDER — MIDODRINE HCL 5 MG PO TABS
10.0000 mg | ORAL_TABLET | Freq: Two times a day (BID) | ORAL | Status: DC
  Administered 2022-12-31 – 2023-01-02 (×6): 10 mg via ORAL
  Filled 2022-12-31 (×6): qty 2

## 2022-12-31 MED ORDER — HYDROCODONE-ACETAMINOPHEN 5-325 MG PO TABS: 1.0000 | ORAL_TABLET | Freq: Four times a day (QID) | ORAL | Status: DC | PRN

## 2022-12-31 MED ORDER — HEPARIN SODIUM (PORCINE) 5000 UNIT/ML IJ SOLN
5000.0000 [IU] | Freq: Three times a day (TID) | INTRAMUSCULAR | Status: DC
  Administered 2022-12-31 – 2023-01-02 (×6): 5000 [IU] via SUBCUTANEOUS
  Filled 2022-12-31 (×7): qty 1

## 2022-12-31 MED ORDER — POTASSIUM CHLORIDE CRYS ER 20 MEQ PO TBCR
20.0000 meq | EXTENDED_RELEASE_TABLET | Freq: Once | ORAL | Status: AC
  Administered 2022-12-31: 20 meq via ORAL
  Filled 2022-12-31: qty 1

## 2022-12-31 MED ORDER — ADULT MULTIVITAMIN W/MINERALS CH
1.0000 | ORAL_TABLET | Freq: Every day | ORAL | Status: DC
  Administered 2022-12-31 – 2023-01-02 (×3): 1 via ORAL
  Filled 2022-12-31 (×3): qty 1

## 2022-12-31 MED ORDER — POTASSIUM CHLORIDE CRYS ER 20 MEQ PO TBCR
40.0000 meq | EXTENDED_RELEASE_TABLET | Freq: Once | ORAL | Status: AC
  Administered 2022-12-31: 40 meq via ORAL
  Filled 2022-12-31: qty 2

## 2022-12-31 MED ORDER — GABAPENTIN 300 MG PO CAPS
300.0000 mg | ORAL_CAPSULE | Freq: Every day | ORAL | Status: DC
  Administered 2023-01-01 – 2023-01-02 (×2): 300 mg via ORAL
  Filled 2022-12-31 (×2): qty 1

## 2022-12-31 MED ORDER — FUROSEMIDE 10 MG/ML IJ SOLN
40.0000 mg | Freq: Once | INTRAMUSCULAR | Status: AC
  Administered 2022-12-31: 40 mg via INTRAVENOUS
  Filled 2022-12-31: qty 4

## 2022-12-31 MED ORDER — GABAPENTIN 300 MG PO CAPS
600.0000 mg | ORAL_CAPSULE | Freq: Every day | ORAL | Status: DC
  Administered 2022-12-31 – 2023-01-01 (×2): 600 mg via ORAL
  Filled 2022-12-31 (×2): qty 2

## 2022-12-31 NOTE — Consult Note (Addendum)
CARDIOLOGY CONSULT NOTE  Patient ID: RICKETTA COLANTONIO MRN: [id number] DOB/AGE: Dec 17, 1933 89 y.o.  Admit date: 12/30/2022 Referring Physician: [facility] hospitalist Reason for Consultation: Bradycardia  HPI:   89 year old Caucasian female with hypertrophic obstructive cardiomyopathy, paroxysmal A-fib, known sinus bradycardia, multiple sclerosis, cutaneous T-cell lymphoma, hypothyroidism, esophageal dysphagia.  Patient lives with her husband, a retired Education officer, community.  She ambulates on wheelchair, but fairly independent with ADLs.  She does have an aide visiting her.  She got admitted to the hospital on 12/30/2022 with worsening shortness of breath.  Cardiology consulted due to bradycardia and shortness of breath.  Her breathing is improved with IV diuresis, but not back to baseline. Chest Xray did show b/l pleural effusions. Amiodarone and beta blockers are currently on hold.   Patient and daughter also mention about blood in urine, and currently havign a catheter. I do not know if she has been seen by urology. Eliquis is currently on hold due to the same.   Past Medical History:  Diagnosis Date   Anxiety    High cholesterol    Hypertrophic cardiomyopathy (HCC)    Multiple sclerosis (HCC)    Paroxysmal A-fib (HCC)    Rotator cuff tear    left     Past Surgical History:  Procedure Laterality Date   BLADDER SURGERY  2018   STEM placement    ULNAR NERVE REPAIR Right 2010      Family History  Problem Relation Age of Onset   Heart attack Mother    Transient ischemic attack Mother    Arthritis Mother    Heart Problems Father      Social History: Social History   Socioeconomic History   Marital status: Married    Spouse name: [name]   Number of children: 3   Years of education: [employer] education level: Not on file  Occupational History   Occupation: Retired  Tobacco Use   Smoking status: Never   Smokeless tobacco: Never  Vaping Use   Vaping Use: Never used   Substance and Sexual Activity   Alcohol use: Yes    Comment: 2 drinks per week   Drug use: Never   Sexual activity: Not Currently  Other Topics Concern   Not on file  Social History Narrative   Originally right handed but had surgery and now uses left hand for eating and other activities   Caffeine use: 2 cups coffee per day   Lives with husband   Lives at [place] with husband in [place]   Social Determinants of Health   Financial Resource Strain: Not on file  Food Insecurity: Not on file  Transportation Needs: Not on file  Physical Activity: Not on file  Stress: Not on file  Social Connections: Not on file  Intimate Partner Violence: Not on file     Medications Prior to Admission  Medication Sig Dispense Refill Last Dose   amiodarone (PACERONE) 200 MG tablet Take 0.5 tablets (100 mg total) by mouth daily.      AMPYRA 10 MG TB12 Take 1 tablet (10 mg total) by mouth in the morning and at bedtime. BRAND NAME AMPYRA MEDICALLY NECESSARY 60 tablet 10    apixaban (ELIQUIS) 2.5 MG TABS tablet Take 1 tablet (2.5 mg total) by mouth 2 (two) times daily. 60 tablet 0    baclofen (LIORESAL) 10 MG tablet TAKE 1/2 TABLET TWICE DAILY 90 tablet 2    cephALEXin (KEFLEX) 500 MG capsule Take 1 capsule (500 mg total) by mouth  3 (three) times daily for 7 days. 21 capsule 0    clobetasol cream (TEMOVATE) 0.05 % Apply 1 application topically as needed.      Cranberry-Vitamin C-Probiotic (AZO CRANBERRY PO) Take 1 tablet by mouth daily.      diclofenac Sodium (VOLTAREN) 1 % GEL Apply 2 g topically as needed (joint pain).      estradiol (ESTRING) 2 MG vaginal ring Place 2 mg vaginally every 3 (three) months. follow package directions      folic acid (FOLVITE) 1 MG tablet Take 1 mg by mouth daily.      gabapentin (NEURONTIN) 300 MG capsule Take 300 mg by mouth 3 (three) times daily.      [EXPIRED] HYDROcodone-acetaminophen (NORCO) 5-325 MG tablet Take 1 tablet by mouth every 8 (eight) hours as  needed for up to 3 days for severe pain. 9 tablet 0    hydrocortisone 2.5 % cream Apply 1 application topically as needed.      levothyroxine (SYNTHROID) 25 MCG tablet Take 75 mcg by mouth daily before breakfast.      liothyronine (CYTOMEL) 5 MCG tablet Take 5 mcg by mouth in the morning and at bedtime.      methenamine (HIPREX) 1 g tablet Take 1 g by mouth daily.      methotrexate (RHEUMATREX) 2.5 MG tablet Take 2.5 mg by mouth once a week. Caution:Chemotherapy. Protect from light. 6 tablets weekley      mupirocin cream (BACTROBAN) 2 % Apply 1 application topically as needed.      nitrofurantoin (MACRODANTIN) 100 MG capsule Take 50 mg by mouth at bedtime.      pantoprazole (PROTONIX) 40 MG tablet Take 40 mg by mouth daily.      polyethylene glycol (MIRALAX / GLYCOLAX) 17 g packet Take 17 g by mouth daily.      Probiotic Product (PROBIOTIC PO) Take 2 tablets by mouth daily.      propranolol ER (INDERAL LA) 80 MG 24 hr capsule Take 1 capsule (80 mg total) by mouth daily. 90 capsule 1    rosuvastatin (CRESTOR) 20 MG tablet Take 20 mg by mouth daily. (Patient not taking: Reported on 12/24/2022)      solifenacin (VESICARE) 5 MG tablet Take 1 tablet by mouth daily.      triamcinolone cream (KENALOG) 0.1 % Apply 1 application topically as needed.      VITAMIN D PO Take 4,000 Units by mouth daily.      zolpidem (AMBIEN) 5 MG tablet Take 5 mg by mouth at bedtime.       Review of Systems  Cardiovascular:  Positive for leg swelling. Negative for chest pain, palpitations and syncope.  Respiratory:  Positive for shortness of breath.      Vitals:   12/31/22 0200 12/31/22 0429  BP:  (!) 96/55  Pulse: (!) 46 (!) 45  Resp: 13 12  Temp:  97.6 F (36.4 C)  SpO2: 98% 96%    Physical Exam: Physical Exam Vitals and nursing note reviewed.  Constitutional:      General: She is not in acute distress. Neck:     Vascular: No JVD.  Cardiovascular:     Rate and Rhythm: Normal rate and regular rhythm.      Heart sounds: Murmur heard.     Blowing holosystolic murmur is present with a grade of 3/6.  Pulmonary:     Effort: Pulmonary effort is normal.     Breath sounds: Normal breath sounds. No wheezing or rales.  Musculoskeletal:     Right lower leg: Edema (1+, chronic) present.     Left lower leg: Edema (1+, chronic) present.        Imaging/tests reviewed and independently interpreted: Lab Results: CBC, BMP  CXR 12/30/2022: 1. Unchanged moderate left and small right pleural effusions with bibasilar atelectasis.    Cardiac Studies:  Telemetry 12/31/2022: Sinus bradycardia in 40s-50s  EKG 12/31/2022: Sinus bradycardia 46 bpm LBBB  Echocardiogram 12/31/2022: Pending (Requested additional aortic valve images to distinguish between HOCM vs AS-previously known to be HOCM-not AS).  External echocardiogram 08/2022: New SAM and associated dynamic LVOT obstruction and severe MR.  The left ventricular cavity is small. Severe asymmetric left ventricular  septal hypertrophy consistent with hypertrophic obstructive cardiomyopathy  with normal wall motion and systolic function. LV ejection fraction 60-65%.  There is systolic anterior motion of the mitral valve with severe resting LVOT  gradient of 91 mm Hg. Doppler gradients after Valsalva appear reduced, I  suspect due to sverity of obstruction with limited outflow.  Mildly reduced LV Global L Strain =-16.7%. LV longitudinal strain imaging  showed segmental reduction pattern.  There is mild, Grade I diastolic dysfunction, with indeterminate left atrial  pressure. LAP indeterminate ue to HCM and reduced annular e  values on that  basis.  The right ventricle is normal in size and function.  The left atrium is severely dilated.  There is mild aortic regurgitation.  There is severe mitral regurgitation due to [facility].  There was insufficient TR detected to calculate RV systolic pressure.  IVC size was normal.  The aortic sinus is normal  size.     Assessment & Recommendations:  89 year old Caucasian female with hypertrophic obstructive cardiomyopathy, paroxysmal A-fib, known sinus bradycardia, multiple sclerosis, cutaneous T-cell lymphoma, hypothyroidism, esophageal dysphagia.  Dyspnea: Acute on chronic HFpEF with underlying HOCM. (Requested additional aortic valve images to distinguish between HOCM vs AS-previously known to be HOCM-not AS). I do not think she is a candidate for any corrective procedures for her HOCM due to her advanced age and comorbidities. Management, therefore, remains medical and conservative.  Okay to continue IV lasix, if MAP stays >60 mmHg. Consider thoracentesis given b/l pleural effusions.  I discussed with the patient and daughter re: long term goals of care. They are in agreement with palliative care consult.  Bradycardia: Underlying paroxysmal Afib., currently in sinus bradycardia. Heart rate is historically in 40s and 50s owing to medications (propranolol and amiodarone). Bradycardia should help with LVOT gradient. In absence of symptoms, reasonable to keep HR low. Given her borderline low blood pressures, okay to hold propranolol. Amiodarone is currently held. Can resume at 100 mg every other day tomorrow.  High stroke risk regardless of CHA2DS2VASc score given her HOCM. Eliquis currently held due to hematuria. Resume when deemed safe. I do not think she's a candidate for LAA closure.   Will peripherally follow the patient.  Discussed interpretation of tests and management recommendations with the primary team     Elder Negus, MD Pager: [phone] Office: [phone]

## 2022-12-31 NOTE — Progress Notes (Signed)
PROGRESS NOTE                                                                                                                                                                                                             Patient Demographics:    Tina Webb, is a 89 y.o. female, DOB - Dec 05, 1933, UXN:[id number]  Outpatient Primary MD for the patient is Nadara Eaton, MD    LOS - 1  Admit date - 12/30/2022    Chief Complaint  Patient presents with   Shortness of Breath       Brief Narrative (HPI from H&P)   89 y.o. 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  She was recently diagnosed with UTI causing hematuria, she is finishing antibiotics for the same, her Eliquis was held on 12/27/2022 due to hematuria, urine seems to be clearing, she presented to the hospital with exertional shortness of breath.  She was diagnosed with CHF and admitted.   Subjective:    Tina Webb today has, No headache, No chest pain, No abdominal pain - No Nausea, No new weakness tingling or numbness, improved SOB.   Assessment  & Plan :   Exertional shortness of breath due to acute on chronic diastolic CHF.  She has been started on Lasix with good improvement in her shortness of breath, blood pressure now on the lower side, hold further diuretics, currently shortness of breath much improved, no indication for thoracentesis yet, continue to monitor, advance activity titrate down oxygen.  Cardiology to see shortly.  CTA chest negative for PE.  History of MS.  Wheelchair-bound.  Right side more weak than left.  PT OT.  Recent history of hematuria and UTI, indwelling Foley catheter placed few days after the diagnosis of UTI in the outpatient setting.  Finished IV  antibiotics, follow final cultures, home Eliquis has been held, could be resumed in the next few days, had a Foley catheter placed 3 days ago which for now will be continued.  Hematuria seems to have resolved.  Paroxysmal atrial fibrillation [place] vas 2 score of greater than 3.  Currently bradycardic, beta-blocker discontinued, amiodarone skip today, cardiology to see and opine, currently see Eliquis discussion above.  Constipation.  Placed on bowel regimen.  Hypothyroidism.  Continue home dose Synthroid.  TSH stable.    Left leg superficial wound.  Local wound care.  History of lung nodules, possible soft tissue mass on CT scan.  Palliative care to see for goals of care, to be addressed outpatient by PCP as appropriate by age, appears to be too frail to tolerate any major surgical procedures or chemotherapy.  Had long discussion with patient's husband, he is leaning more towards gentle medical treatment and avoiding heroics.  He will follow-up with PCP for long-term plan on this issue.       Condition - Extremely Guarded  Family Communication  : Jefm Bryant [phone] on 12/31/2022   Code Status :  DNR  Consults  :  Pall Care, Cards  PUD Prophylaxis : PPI   Procedures  :     CT chest - 1. No evidence of pulmonary embolus. 2. Moderate right and large left pleural effusions with associated atelectasis. 3. Soft tissue lesion of the medial left chest located between the costochondral cartilage of the first and second ribs, concerning for metastatic disease, although benign etiology also possible. Based on goals of care, finding can be further evaluated with soft tissue ultrasound and tissue sampling and/or PET-CT. 4. Right upper lobe predominant ground-glass opacities and septal thickening, likely due to pulmonary edema. 5. Scattered solid pulmonary nodules measuring up to 6 mm.  CT abdomen pelvis - 1. Severely diverticular urinary bladder, similar to prior examination. 2. No evidence of  urinary tract calculus or hydronephrosis. 3. Large burden of stool and stool balls throughout the colon and rectum. 4. Large left, moderate right pleural effusions. 5. Cardiomegaly. Aortic Atherosclerosis (ICD10-I70.0).      Disposition Plan  :    Status is: Inpatient  DVT Prophylaxis  :    SCDs Start: 12/30/22 2222    Lab Results  Component Value Date   PLT 435 (H) 12/31/2022    Diet :  Diet Order             Diet Heart Room service appropriate? Yes; Fluid consistency: Thin; Fluid restriction: 1200 mL Fluid  Diet effective now                    Inpatient Medications  Scheduled Meds:  [START ON 01/01/2023] amiodarone  100 mg Oral Daily   baclofen  5 mg Oral BID   docusate sodium  100 mg Oral BID   gabapentin  300 mg Oral TID   levothyroxine  75 mcg Oral QAC breakfast   liothyronine  5 mcg Oral Daily   pantoprazole  40 mg Oral Daily   polyethylene glycol  17 g Oral Daily   potassium chloride  20 mEq Oral Once   Continuous Infusions:  cefTRIAXone (ROCEPHIN)  IV 200 mL/hr at 12/31/22 0657   PRN Meds:.acetaminophen **OR** acetaminophen, albuterol, bisacodyl, HYDROcodone-acetaminophen, ondansetron **OR** ondansetron (ZOFRAN) IV   Objective:   Vitals:   12/31/22 0000 12/31/22 0200 12/31/22 0429 12/31/22 0939  BP: (!) 90/50  (!) 96/55 (!) 86/57  Pulse: (!) 46 (!) 46 (!) 45 (!) 48  Resp: 18 13 12 17   Temp: 97.9 F (36.6 C)  97.6 F (36.4 C)   TempSrc: Oral  Oral   SpO2: 95% 98% 96% 93%    Wt Readings from Last 3 Encounters:  12/27/22 53 kg  12/15/22 54.4 kg  11/20/22 52.2 kg     Intake/Output Summary (Last 24 hours) at 12/31/2022 1016 Last data  filed at 12/31/2022 0100 Gross per 24 hour  Intake 220 ml  Output 1770 ml  Net -1550 ml     Physical Exam  Awake Alert, No new F.N deficits, Right side weaker than the left side, foley [place].AT,PERRAL Supple Neck, No JVD,   Symmetrical Chest wall movement, Good air movement bilaterally, bibasilar  rales RRR,No Gallops,Rubs or new Murmurs,  +ve B.Sounds, Abd Soft, No tenderness,   No Cyanosis, Clubbing or edema      Data Review:    Recent Labs  Lab 12/27/22 0943 12/30/22 1101 12/31/22 0131  WBC 9.7 10.7* 8.9  HGB 11.8* 13.4 11.9*  HCT 37.9 41.9 36.5  PLT 367 452* 435*  MCV 92.4 90.3 89.7  MCH 28.8 28.9 29.2  MCHC 31.1 32.0 32.6  RDW 18.1* 18.1* 17.7*  LYMPHSABS 0.5* 0.5*  --   MONOABS 0.7 0.8  --   EOSABS 0.2 0.2  --   BASOSABS 0.0 0.0  --     Recent Labs  Lab 12/27/22 0943 12/30/22 1101 12/30/22 1711 12/30/22 2246 12/31/22 0131  NA 138 142  --  141 139  K 3.9 3.9  --  3.7 3.5  CL 105 108  --  106 109  CO2 23 24  --  21* 22  ANIONGAP 10 10  --  14 8  GLUCOSE 103* 92  --  100* 100*  BUN 48* 29*  --  24* 22  CREATININE 0.61 0.63  --  0.59 0.61  AST 24  --   --  16 16  ALT 25  --   --  17 16  ALKPHOS 53  --   --  45 44  BILITOT 0.7  --   --  0.6 0.5  ALBUMIN 3.1*  --   --  2.8* 2.7*  PROCALCITON  --   --   --  <0.10  --   INR 1.2  --   --   --   --   TSH  --   --   --  5.150*  --   BNP  --  1,610.9*  --   --   --   MG  --   --  2.3  --  2.1  CALCIUM 8.6* 9.5  --  8.7* 8.4*      Recent Labs  Lab 12/27/22 0943 12/30/22 1101 12/30/22 1711 12/30/22 2246 12/31/22 0131  PROCALCITON  --   --   --  <0.10  --   INR 1.2  --   --   --   --   TSH  --   --   --  5.150*  --   BNP  --  6,045.4*  --   --   --   MG  --   --  2.3  --  2.1  CALCIUM 8.6* 9.5  --  8.7* 8.4*    Recent Labs    12/30/22 2246  TSH 5.150*   Radiology Reports CT Angio Chest PE W and/or Wo Contrast  Result Date: 12/30/2022 CLINICAL DATA:  Shortness of breath for 2 weeks EXAM: CT ANGIOGRAPHY CHEST WITH CONTRAST TECHNIQUE: Multidetector CT imaging of the chest was performed using the standard protocol during bolus administration of intravenous contrast. Multiplanar CT image reconstructions and MIPs were obtained to evaluate the vascular anatomy. RADIATION DOSE REDUCTION: This exam  was performed according to the departmental dose-optimization program which includes automated exposure control, adjustment of the mA and/or kV according to patient size and/or use  of iterative reconstruction technique. CONTRAST:  75mL OMNIPAQUE IOHEXOL 350 MG/ML SOLN COMPARISON:  None Available. FINDINGS: Cardiovascular: No evidence of pulmonary embolus.Mild cardiomegaly. No pericardial effusion. Normal caliber thoracic aorta with moderate atherosclerotic disease. Mediastinum/Nodes: Small hiatal hernia. Left-sided thyroid nodule measuring 14 mm, no follow-up imaging is necessary. Enlarged lymph nodes seen in the chest. Lungs/Pleura: Central airways are patent. Moderate right and large left pleural effusions with associated atelectasis. Right upper lobe predominant ground-glass opacities and septal thickening. Scattered solid pulmonary nodules. Largest measures 6 mm in the right lower lobe on series 8, image 32. Upper Abdomen: Gallstones. Hyperdense material seen in the gallbladder, likely due to sludge. No acute abnormality. Musculoskeletal: Soft tissue lesion of the medial left chest wall measuring 2.8 x 1.5 cm on series 6, image 38 located between the costochondral cartilage of the left first and second ribs. No aggressive appearing osseous lesions. Review of the MIP images confirms the above findings. IMPRESSION: 1. No evidence of pulmonary embolus. 2. Moderate right and large left pleural effusions with associated atelectasis. 3. Soft tissue lesion of the medial left chest located between the costochondral cartilage of the first and second ribs, concerning for metastatic disease, although benign etiology also possible. Based on goals of care, finding can be further evaluated with soft tissue ultrasound and tissue sampling and/or PET-CT. 4. Right upper lobe predominant ground-glass opacities and septal thickening, likely due to pulmonary edema. 5. Scattered solid pulmonary nodules measuring up to 6 mm.  Non-contrast chest CT at 3-6 months is recommended. If the nodules are stable at time of repeat CT, then future CT at 18-24 months (from today's scan) is considered optional for low-risk patients, but is recommended for high-risk patients. This recommendation follows the consensus statement: Guidelines for Management of Incidental Pulmonary Nodules Detected on CT Images: From the Fleischner Society 2017; Radiology 2017; 284:228-243. 6. Aortic Atherosclerosis (ICD10-I70.0). Electronically Signed   By: Allegra Lai M.D.   On: 12/30/2022 13:40   DG Chest Port 1 View  Result Date: 12/30/2022 CLINICAL DATA:  Shortness of breath for the past 2 weeks. EXAM: PORTABLE CHEST 1 VIEW COMPARISON:  Chest x-ray dated December 24, 2022. FINDINGS: The heart borders are partially obscured but remain mildly enlarged. Interstitial edema in the right lung has improved. Unchanged moderate left and small right pleural effusions with left-greater-than-right basilar atelectasis. No pneumothorax. No acute osseous abnormality. IMPRESSION: 1. Unchanged moderate left and small right pleural effusions with bibasilar atelectasis. Electronically Signed   By: Obie Dredge M.D.   On: 12/30/2022 11:34   CT ABDOMEN PELVIS W CONTRAST  Result Date: 12/27/2022 CLINICAL DATA:  Gross hematuria, vaginal bleeding, incontinence EXAM: CT ABDOMEN AND PELVIS WITH CONTRAST TECHNIQUE: Multidetector CT imaging of the abdomen and pelvis was performed using the standard protocol following bolus administration of intravenous contrast. RADIATION DOSE REDUCTION: This exam was performed according to the departmental dose-optimization program which includes automated exposure control, adjustment of the mA and/or kV according to patient size and/or use of iterative reconstruction technique. CONTRAST:  OMNIPAQUE IOHEXOL 300 MG/ML  SOLN COMPARISON:  08/02/2021 FINDINGS: Lower chest: Large left, moderate right pleural effusions. Cardiomegaly. Hepatobiliary:  No solid liver abnormality is seen. Sludge and small gallstones. No gallbladder wall thickening, or biliary dilatation. Pancreas: Unremarkable. No pancreatic ductal dilatation or surrounding inflammatory changes. Spleen: Normal in size without significant abnormality. Adrenals/Urinary Tract: Adrenal glands are unremarkable. Kidneys are normal, without renal calculi, solid lesion, or hydronephrosis. Severely diverticular urinary bladder, similar to prior examination (series 3, image  67). Stomach/Bowel: Stomach is within normal limits. Appendix not clearly visualized. No evidence of bowel wall thickening, distention, or inflammatory changes. Large burden of stool and stool balls throughout the colon and rectum. Vascular/Lymphatic: Aortic atherosclerosis. No enlarged abdominal or pelvic lymph nodes. Reproductive: No mass or other significant abnormality. Other: Fat containing umbilical hernia.  No ascites. Musculoskeletal: No acute or significant osseous findings. IMPRESSION: 1. Severely diverticular urinary bladder, similar to prior examination. 2. No evidence of urinary tract calculus or hydronephrosis. 3. Large burden of stool and stool balls throughout the colon and rectum. 4. Large left, moderate right pleural effusions. 5. Cardiomegaly. Aortic Atherosclerosis (ICD10-I70.0). Electronically Signed   By: Jearld Lesch M.D.   On: 12/27/2022 11:45      Signature  -   Susa Raring M.D on 12/31/2022 at 10:16 AM   -  To page go to [email or web address]

## 2022-12-31 NOTE — Progress Notes (Signed)
Initial Nutrition Assessment  DOCUMENTATION CODES:  Not applicable  INTERVENTION:  Liberalize diet to regular due to increased needs. Continue 1.2 L fluid restriction Encourage good PO intake  Multivitamin w/ minerals daily  NUTRITION DIAGNOSIS:  Increased nutrient needs related to chronic illness as evidenced by estimated needs.  GOAL:  Patient will meet greater than or equal to 90% of their needs  MONITOR:  PO intake, Labs, Weight trends, I & O's  REASON FOR ASSESSMENT:  Consult Assessment of nutrition requirement/status  ASSESSMENT:   89 y.o. female presented to the ED with SOB. PMH includes A. Fib., GERD, MS, wheelchair bound, and cutaneous T cell lymphoma. Pt admitted with acute on chronic CHF.   Pt sitting up in bed, family at bedside. Pt reports that she has a pretty good appetite and eats well at home. Reports that she eats 3 meals per day. Does not drink ONS at home. Husband reports that he tries to get her to drink them but she does not like them. Denies any nausea, vomiting, diarrhea, and constipation.  Denies any weight loss. Per EMR, weight appears stable over the past year.   Medications reviewed and include: Colace, Lasix, Protonix, Miralax, Potassium Chloride, IV rocephin Labs reviewed: Sodium 139, Potassium 3.5, Phosphorus 4.9, Magnesium 2.1   NUTRITION - FOCUSED PHYSICAL EXAM:  Deferred to follow-up due to pt eating breakfast.   Diet Order:   Diet Order             Diet regular Room service appropriate? Yes; Fluid consistency: Thin; Fluid restriction: 1200 mL Fluid  Diet effective now                  EDUCATION NEEDS:No education needs have been identified at this time  Skin:  Skin Assessment: Reviewed RN Assessment  Last BM:  6/24  Height:  Ht Readings from Last 1 Encounters:  12/27/22 5\' 2"  (1.575 m)   Weight:  Wt Readings from Last 1 Encounters:  12/27/22 53 kg   Ideal Body Weight:  50 kg  BMI:  There is no height or weight on file  to calculate BMI.  Estimated Nutritional Needs:  Kcal:  1400-1600 Protein:  70-90 grams Fluid:  >/= 1.5 L   Kirby Crigler RD, LDN Clinical Dietitian See [facility] for contact information.

## 2022-12-31 NOTE — Progress Notes (Signed)
Heart Failure Navigator Progress Note  Assessed for Heart & Vascular TOC clinic readiness.  Patient does not meet criteria due to [facility] patient.   Navigator will sign off at this time.   Sasuke Yaffe, BSN, RN Heart Failure Nurse Navigator Secure Chat Only   

## 2022-12-31 NOTE — Evaluation (Signed)
Physical Therapy Evaluation Patient Details Name: Tina Webb MRN: [id number] DOB: 08/07/1933 Today's Date: 12/31/2022  History of Present Illness  89 y.o. female presented to the ED 6/24 with SOB. Pt admitted with acute on chronic CHF. PMHx includes A. Fib., GERD, MS, wheelchair bound, and cutaneous T cell lymphoma.  Clinical Impression  Pt presents today likely close to her baseline but with deficits in overall mobility, strength, and balance. Pt requires assist for balance and transfers at baseline, utilizing a power wheelchair for all mobility. Pt limited during today's session by a drop in her BP while EOB, pt asymptomatic but BP improving when back in supine, RN present and aware. Pt is on room air at baseline, currently tolerating 2L O2 [place] with mobility but reports fatiguing more easily. Pt will continue to benefit from skilled acute PT during admission to progress endurance with mobility. Pt with no further PT needs upon discharge, has full time care from her husband and an aide, along with a personal trainer who assists pt with  exercises. PT will follow during admission as appropriate.        Recommendations for follow up therapy are one component of a multi-disciplinary discharge planning process, led by the attending physician.  Recommendations may be updated based on patient status, additional functional criteria and insurance authorization.  Follow Up Recommendations       Assistance Recommended at Discharge Frequent or constant Supervision/Assistance  Patient can return home with the following  A lot of help with walking and/or transfers;A lot of help with bathing/dressing/bathroom;Assistance with cooking/housework;Assist for transportation    Equipment Recommendations None recommended by PT  Recommendations for Other Services       Functional Status Assessment Patient has had a recent decline in their functional status and/or demonstrates limited ability to make  significant improvements in function in a reasonable and predictable amount of time     Precautions / Restrictions Precautions Precautions: Fall;Other (comment) Precaution Comments: watch BP and O2, B heel wounds Restrictions Weight Bearing Restrictions: No      Mobility  Bed Mobility Overal bed mobility: Needs Assistance Bed Mobility: Supine to Sit, Sit to Supine     Supine to sit: HOB elevated, Max assist Sit to supine: Mod assist, HOB elevated   General bed mobility comments: assist for BLE for supine<>sit, mild assist for trunk to pull into sitting and to scoot hips to EOB, pt utilizing bed rail    Transfers                   General transfer comment: low BP at EOB, deferred any transfers    Ambulation/Gait               General Gait Details: pt not ambulatory at baseline  Stairs            Wheelchair Mobility    Modified Rankin (Stroke Patients Only)       Balance Overall balance assessment: Needs assistance Sitting-balance support: Bilateral upper extremity supported, Feet supported Sitting balance-Leahy Scale: Fair Sitting balance - Comments: utilizing BUE for balance                                     Pertinent Vitals/Pain Pain Assessment Pain Assessment: No/denies pain    Home Living Family/patient expects to be discharged to:: Private residence Living Arrangements: Spouse/significant other Available Help at Discharge: Family;Available 24 hours/day;Personal care attendant  Type of Home: House Home Access: Ramped entrance       Home Layout: One level Home Equipment: Grab bars - tub/shower;Wheelchair - power;Hospital bed;Shower seat;Grab bars - toilet Additional Comments: aide 4 days/week, ~2 hours/day, personal trainer 3 days/week, ~1 hour/day to take pt through exercises    Prior Function Prior Level of Function : Needs assist       Physical Assist : Mobility (physical) Mobility (physical): Bed  mobility;Transfers ADLs (physical): Bathing;Dressing;Toileting;IADLs Mobility Comments: assist for bed mobility and transfers, able to navigate power wheelchair. No falls in past 6 months ADLs Comments: able to complete feeding/grooming with set up sitting at w/c or bed level. Pt uses electric w/c, needs assistance for bathing/dressing/IADLs     Hand Dominance   Dominant Hand: Right    Extremity/Trunk Assessment   Upper Extremity Assessment Upper Extremity Assessment: Defer to OT evaluation RUE Deficits / Details: hx of R ulnar neuropathy, R hand dominant, difficulty with functional grip RUE Sensation: history of peripheral neuropathy RUE Coordination: decreased fine motor LUE Deficits / Details: L RTC tear, little to no ER LUE: Shoulder pain with ROM LUE Sensation: history of peripheral neuropathy LUE Coordination: decreased gross motor    Lower Extremity Assessment Lower Extremity Assessment: Generalized weakness;RLE deficits/detail;LLE deficits/detail RLE Deficits / Details: active movement throughout but unable to lift against gravity, reports R side is her weaker side LLE Deficits / Details: able to extend knee against gravity and partial hip flexion against gravity, no resistance applied today       Communication   Communication: No difficulties  Cognition Arousal/Alertness: Awake/alert Behavior During Therapy: WFL for tasks assessed/performed Overall Cognitive Status: Within Functional Limits for tasks assessed                                 General Comments: A&Ox4, pleasant throughout session        General Comments General comments (skin integrity, edema, etc.): BP soft in supine, pt asymptomatic 88/49. BP in sitting 64/47, pt remains asymptomatic, RN present, pt returned to supine and BP improving to 106/71. Husband at bedside throughout, very supportive. SPO2 stable on 2L O2 [place]    Exercises     Assessment/Plan    PT Assessment Patient needs  continued PT services  PT Problem List Decreased strength;Decreased activity tolerance;Decreased balance;Decreased mobility;Decreased coordination;Cardiopulmonary status limiting activity       PT Treatment Interventions DME instruction;Functional mobility training;Therapeutic activities;Therapeutic exercise;Balance training;Neuromuscular re-education;Patient/family education    PT Goals (Current goals can be found in the Care Plan section)  Acute Rehab PT Goals Patient Stated Goal: go home PT Goal Formulation: With patient/family Time For Goal Achievement: 01/14/23 Potential to Achieve Goals: Fair    Frequency Min 2X/week     Co-evaluation               AM-PAC PT "6 Clicks" Mobility  Outcome Measure Help needed turning from your back to your side while in a flat bed without using bedrails?: A Lot Help needed moving from lying on your back to sitting on the side of a flat bed without using bedrails?: A Lot Help needed moving to and from a bed to a chair (including a wheelchair)?: A Lot Help needed standing up from a chair using your arms (e.g., wheelchair or bedside chair)?: A Lot Help needed to walk in hospital room?: Total Help needed climbing 3-5 steps with a railing? : Total 6 Click Score: 10  End of Session Equipment Utilized During Treatment: Oxygen Activity Tolerance: Other (comment) (limited by drop in BP at EOB although pt asymptomatic) Patient left: in bed;with call bell/phone within reach;with nursing/sitter in room;with family/visitor present Nurse Communication: Mobility status PT Visit Diagnosis: Muscle weakness (generalized) (M62.81);Other abnormalities of gait and mobility (R26.89)    Time: 1207-1230 PT Time Calculation (min) (ACUTE ONLY): 23 min   Charges:   PT Evaluation $PT Eval Moderate Complexity: 1 Mod          Lindalou Hose, PT DPT Acute Rehabilitation Services Office [phone]   Leonie Man 12/31/2022, 2:03 PM

## 2022-12-31 NOTE — Progress Notes (Signed)
Ok to stop fluconazole per Dr Thedore Mins.  Ulyses Southward, PharmD, BCIDP, AAHIVP, CPP Infectious Disease Pharmacist 12/31/2022 7:56 AM

## 2022-12-31 NOTE — Evaluation (Signed)
Occupational Therapy Evaluation Patient Details Name: Tina Webb MRN: [id number] DOB: [birth year] Today's Date: 12/31/2022   History of Present Illness 89 y.o. female presented to the ED with SOB. PMH includes A. Fib., GERD, MS, wheelchair bound, and cutaneous T cell lymphoma. Pt admitted with acute on chronic CHF.   Clinical Impression   Pt is s/p above diagnosis. Pt in good spirits, motivated to return home, husband/daughter present during session. Pt lives at home with husband who assistance with bed mobility, transfers, dressing, has home aide 4X/wk for bathing/IADLs. Pt states a personal trainer comes 3X/wk for strengthening. Pt is on 2L O2, stable around 93 sitting in bed, on room air desated multiple times to 86, requires verbal cueing to breathe deeply to recover to 92 on room air. Pt currently at baseline assistance, displays decreased activity tolerance, decreased overall strength, would benefit from continued acute care to improve strength/endurance as needed, Pt and family states they have all DME and assistance at home to remain safe and have been assisting Pt for the last 3 years with all needs, no follow up OT required.      Recommendations for follow up therapy are one component of a multi-disciplinary discharge planning process, led by the attending physician.  Recommendations may be updated based on patient status, additional functional criteria and insurance authorization.   Assistance Recommended at Discharge Frequent or constant Supervision/Assistance  Patient can return home with the following A little help with walking and/or transfers;A little help with bathing/dressing/bathroom;Assistance with cooking/housework;Assist for transportation;Help with stairs or ramp for entrance    Functional Status Assessment  Patient has had a recent decline in their functional status and demonstrates the ability to make significant improvements in function in a reasonable and  predictable amount of time.  Equipment Recommendations  None recommended by OT    Recommendations for Other Services       Precautions / Restrictions Precautions Precautions: Fall Precaution Comments: BLE wounds, prevalon boots ordered Restrictions Weight Bearing Restrictions: No      Mobility Bed Mobility Overal bed mobility: Needs Assistance Bed Mobility: Rolling, Supine to Sit, Sit to Supine Rolling: Total assist   Supine to sit: Total assist Sit to supine: Total assist   General bed mobility comments: at baseline, total A for bed mobility    Transfers Overall transfer level: Needs assistance                 General transfer comment: noted NWB precautions from RN note with BLE wounds, did not attempt sit to stand      Balance Overall balance assessment: Needs assistance Sitting-balance support: Bilateral upper extremity supported, Feet supported Sitting balance-Leahy Scale: Poor Sitting balance - Comments: uses BUEs for balance sitting EOB Postural control: Left lateral lean                                 ADL either performed or assessed with clinical judgement   ADL Overall ADL's : At baseline;Needs assistance/impaired Eating/Feeding: Set up;Sitting;Bed level   Grooming: Set up;Sitting;Bed level   Upper Body Bathing: Moderate assistance;Sitting   Lower Body Bathing: Maximal assistance;Sitting/lateral leans   Upper Body Dressing : Moderate assistance;Sitting   Lower Body Dressing: Total assistance;Sitting/lateral leans       Toileting- Clothing Manipulation and Hygiene: Maximal assistance       Functional mobility during ADLs: Total assistance;Wheelchair General ADL Comments: Pt at baseline, recieves help from husband at  home, home aide 4X/wk for bathing, dressing, transfers, bed mobililty, IADLs.     Vision         Perception     Praxis      Pertinent Vitals/Pain Pain Assessment Pain Assessment: No/denies pain      Hand Dominance Right   Extremity/Trunk Assessment Upper Extremity Assessment Upper Extremity Assessment: Generalized weakness;RUE deficits/detail;LUE deficits/detail RUE Deficits / Details: hx of R ulnar neuropathy, R hand dominant, difficulty with functional grip RUE Sensation: history of peripheral neuropathy RUE Coordination: decreased fine motor LUE Deficits / Details: L RTC tear, little to no ER LUE: Shoulder pain with ROM LUE Sensation: history of peripheral neuropathy LUE Coordination: decreased gross motor   Lower Extremity Assessment Lower Extremity Assessment: Defer to PT evaluation       Communication Communication Communication: No difficulties   Cognition Arousal/Alertness: Awake/alert Behavior During Therapy: WFL for tasks assessed/performed Overall Cognitive Status: Within Functional Limits for tasks assessed                                       General Comments  noted NWB restrictions from RN note, did not see order for NWB, progressive mobility states no restrictions, did not attempt transfer or STS today, mod-max A at baseline per husband for transfers    Exercises Exercises: Other exercises Other Exercises Other Exercises: trunk flexion/lateral flexion and extension   Shoulder Instructions      Home Living Family/patient expects to be discharged to:: Private residence Living Arrangements: Spouse/significant other Available Help at Discharge: Family Type of Home: House Home Access: Ramped entrance     Home Layout: One level     Bathroom Shower/Tub: Chief Strategy Officer: Handicapped height Bathroom Accessibility: Yes How Accessible: Accessible via wheelchair Home Equipment: Tub bench;Grab bars - tub/shower;Wheelchair - power;Hospital bed   Additional Comments: Pt lives at home with husband who assists as needed, 4X/wk has aide for bathing/IADLs,      Prior Functioning/Environment Prior Level of Function :  Needs assist       Physical Assist : Mobility (physical);ADLs (physical) Mobility (physical): Bed mobility;Transfers;Gait ADLs (physical): Bathing;Dressing;Toileting;IADLs Mobility Comments: electric w/c, assistance with transfers/bed mobility from husband ADLs Comments: able to complete feeding/grooming with set up sitting at w/c or bed level. Pt uses electric w/c, needs assistance for bathing/dressing/IADLs        OT Problem List: Decreased strength;Decreased range of motion;Decreased activity tolerance;Impaired balance (sitting and/or standing);Decreased coordination;Impaired sensation;Impaired UE functional use      OT Treatment/Interventions: Self-care/ADL training;Therapeutic exercise;Neuromuscular education;Energy conservation;DME and/or AE instruction;Therapeutic activities    OT Goals(Current goals can be found in the care plan section) Acute Rehab OT Goals Patient Stated Goal: to return home OT Goal Formulation: With patient/family Time For Goal Achievement: 01/14/23 Potential to Achieve Goals: Good  OT Frequency: Min 2X/week    Co-evaluation              AM-PAC OT "6 Clicks" Daily Activity     Outcome Measure Help from another person eating meals?: A Little Help from another person taking care of personal grooming?: A Little Help from another person toileting, which includes using toliet, bedpan, or urinal?: A Lot Help from another person bathing (including washing, rinsing, drying)?: A Lot Help from another person to put on and taking off regular upper body clothing?: A Lot Help from another person to put on and taking off regular  lower body clothing?: Total 6 Click Score: 13   End of Session Equipment Utilized During Treatment: Oxygen Nurse Communication: Mobility status  Activity Tolerance: Patient tolerated treatment well Patient left: in bed;with call bell/phone within reach;with family/visitor present  OT Visit Diagnosis: Unsteadiness on feet  (R26.81);Other abnormalities of gait and mobility (R26.89);Muscle weakness (generalized) (M62.81);Other symptoms and signs involving the nervous system (R29.898)                Time: 8295-6213 OT Time Calculation (min): 35 min Charges:  OT General Charges $OT Visit: 1 Visit OT Evaluation $OT Eval Moderate Complexity: 1 Mod OT Treatments $Self Care/Home Management : 8-22 mins  [place], OTR/L   Alexis Goodell 12/31/2022, 11:46 AM

## 2022-12-31 NOTE — Consult Note (Signed)
WOC Nurse Consult Note: Reason for Consult:Patient with full thickness venous insufficiency related ulcerations. Follows weekly at the [facility] outpatient wound care center at [facility] with Dr. Nelda Severe. Last seen by that provider on 12/25/22.  Wound type:Venous insufficiency Pressure Injury POA: N/A Measurement:To be obtained by the Bedside RN and documented on Nursing Flow Sheet with next dressing application Dressing procedure/placement/frequency: I will provide Nursing with guidance for the care of the LEs using a daily cleanse and application of an antimicrobial nonadherent gauze (xeroform) topped with dry gauze and silicone foam followed by a dry boot consisting of Kerlix roll gauze applied from just below toes to just below knees and topped with an ACE bandage applied in a similar manner.   Appreciate the assistance of Bedside RN J. Dahm in the care of this patient and his assistance with this assessment.  A sacral prophylactic foam dressing is to be placed for PI prevention,. Prevalon boots are provided for floatation of heels and PI prevention in this area.  Recommend patient return to the outpatient wound care center for her next appointment at the interval recommended by Dr. Nelda Severe following discharge from acute care.  WOC nursing team will not follow, but will remain available to this patient, the nursing and medical teams.  Please re-consult if needed.  Thank you for inviting [place] to participate in this patient's Plan of Care.  Ladona Mow, MSN, RN, CNS, GNP, Leda Min, [employer], [employer] phone:  [phone]

## 2023-01-01 ENCOUNTER — Inpatient Hospital Stay (HOSPITAL_COMMUNITY): Payer: Medicare Other

## 2023-01-01 ENCOUNTER — Encounter (HOSPITAL_COMMUNITY): Payer: Self-pay | Admitting: Internal Medicine

## 2023-01-01 DIAGNOSIS — I5033 Acute on chronic diastolic (congestive) heart failure: Secondary | ICD-10-CM | POA: Diagnosis not present

## 2023-01-01 DIAGNOSIS — Z515 Encounter for palliative care: Secondary | ICD-10-CM

## 2023-01-01 DIAGNOSIS — Z7189 Other specified counseling: Secondary | ICD-10-CM | POA: Diagnosis not present

## 2023-01-01 HISTORY — PX: IR THORACENTESIS ASP PLEURAL SPACE W/IMG GUIDE: IMG5380

## 2023-01-01 LAB — MAGNESIUM: Magnesium: 2.3 mg/dL (ref 1.7–2.4)

## 2023-01-01 LAB — CBC WITH DIFFERENTIAL/PLATELET
Abs Immature Granulocytes: 0.04 10*3/uL (ref 0.00–0.07)
Basophils Absolute: 0 10*3/uL (ref 0.0–0.1)
Basophils Relative: 0 %
Eosinophils Absolute: 0.2 10*3/uL (ref 0.0–0.5)
Eosinophils Relative: 3 %
HCT: 36.4 % (ref 36.0–46.0)
Hemoglobin: 11.6 g/dL — ABNORMAL LOW (ref 12.0–15.0)
Immature Granulocytes: 1 %
Lymphocytes Relative: 9 %
Lymphs Abs: 0.6 10*3/uL — ABNORMAL LOW (ref 0.7–4.0)
MCH: 28.6 pg (ref 26.0–34.0)
MCHC: 31.9 g/dL (ref 30.0–36.0)
MCV: 89.9 fL (ref 80.0–100.0)
Monocytes Absolute: 0.5 10*3/uL (ref 0.1–1.0)
Monocytes Relative: 7 %
Neutro Abs: 5.7 10*3/uL (ref 1.7–7.7)
Neutrophils Relative %: 80 %
Platelets: 443 10*3/uL — ABNORMAL HIGH (ref 150–400)
RBC: 4.05 MIL/uL (ref 3.87–5.11)
RDW: 17.9 % — ABNORMAL HIGH (ref 11.5–15.5)
WBC: 7.1 10*3/uL (ref 4.0–10.5)
nRBC: 0 % (ref 0.0–0.2)

## 2023-01-01 LAB — BASIC METABOLIC PANEL
Anion gap: 10 (ref 5–15)
BUN: 20 mg/dL (ref 8–23)
CO2: 23 mmol/L (ref 22–32)
Calcium: 8.6 mg/dL — ABNORMAL LOW (ref 8.9–10.3)
Chloride: 106 mmol/L (ref 98–111)
Creatinine, Ser: 0.57 mg/dL (ref 0.44–1.00)
GFR, Estimated: >60 mL/min (ref >60)
Glucose, Bld: 112 mg/dL — ABNORMAL HIGH (ref 70–99)
Potassium: 4.3 mmol/L (ref 3.5–5.1)
Sodium: 139 mmol/L (ref 135–145)

## 2023-01-01 LAB — BRAIN NATRIURETIC PEPTIDE: B Natriuretic Peptide: 2499.3 pg/mL — ABNORMAL HIGH (ref 0.0–100.0)

## 2023-01-01 MED ORDER — HYDROCORTISONE 0.5 % EX CREA
1.0000 | TOPICAL_CREAM | Freq: Three times a day (TID) | CUTANEOUS | Status: DC | PRN
  Filled 2023-01-01: qty 28.35

## 2023-01-01 MED ORDER — LIDOCAINE HCL 1 % IJ SOLN
INTRAMUSCULAR | Status: AC
  Filled 2023-01-01: qty 20

## 2023-01-01 MED ORDER — DALFAMPRIDINE ER 10 MG PO TB12
10.0000 mg | ORAL_TABLET | Freq: Two times a day (BID) | ORAL | Status: DC
  Administered 2023-01-01 – 2023-01-02 (×2): 10 mg via ORAL
  Filled 2023-01-01 (×3): qty 1

## 2023-01-01 MED ORDER — AMIODARONE HCL 200 MG PO TABS
100.0000 mg | ORAL_TABLET | ORAL | Status: DC
  Administered 2023-01-01: 100 mg via ORAL
  Filled 2023-01-01: qty 1

## 2023-01-01 NOTE — Procedures (Signed)
PROCEDURE SUMMARY:  Successful US guided left thoracentesis. Yielded 1.2 L of clear yellow fluid. Pt tolerated procedure well. No immediate complications.  Specimen not sent for labs. CXR ordered; no post-procedure pneumothorax identified.   EBL < 2 mL  Mickie Kay, NP 01/01/2023 1:52 PM

## 2023-01-01 NOTE — Progress Notes (Addendum)
Progress Note    Tina Webb  OAC:[id number] DOB: 07-27-1933  DOA: 12/30/2022 PCP: Nadara Eaton, MD      Brief Narrative:    Medical records reviewed and are as summarized below:  Tina Webb is a 89 y.o. 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  She was recently diagnosed with UTI causing hematuria, she is finishing antibiotics for the same, her Eliquis was held on 12/27/2022 due to hematuria, urine seems to be clearing, she presented to the hospital with exertional shortness of breath.  She was diagnosed with CHF and admitted.       Assessment/Plan:   Principal Problem:   Acute on chronic diastolic CHF (congestive heart failure) (HCC) Active Problems:   Multiple sclerosis (HCC)   Hypothyroidism   Soft tissue mass   Bilateral pleural effusion   Acute respiratory failure with hypoxia (HCC)   UTI (urinary tract infection)   Paroxysmal atrial fibrillation (HCC)   Leg wound, left   Constipation   Nutrition Problem: Increased nutrient needs Etiology: chronic illness  Signs/Symptoms: estimated needs   Body mass index is 21.65 kg/m.   Acute on chronic diastolic CHF: IV Lasix on hold for now.  Monitor BMP, daily weight and urine output.  Follow-up with cardiologist    Bilateral thoracentesis (large left and moderate right): Plan for thoracentesis today.   Acute hypoxic respiratory failure: Continue 2 L/min oxygen via [place].  Taper off oxygen as able.   Paroxysmal atrial fibrillation: Cardiologist recommended amiodarone to be scheduled every other day.  Eliquis on hold.  Metoprolol on hold because of bradycardia.   Recent acute UTI and hematuria requiring placement of indwelling Foley catheter in the outpatient setting: Hematuria has resolved.  Discontinue Foley  catheter. Patient has a vaginal ring that was placed by urologist.  Family request that new vaginal ring be placed. Spoke to Dr. Debroah Loop, gynecologist, about replacing vaginal ring.  He said it's not urgent and it can be done in the outpatient setting   History of lung nodules, possible soft tissue mass on CT scan: Outpatient follow-up with PCP if desired.  Palliative care to see patient for goals of care discussion.   Other comorbidities include constipation, hypothyroidism, multiple sclerosis (wheelchair-bound)    Diet Order             Diet regular Room service appropriate? Yes; Fluid consistency: Thin; Fluid restriction: 1200 mL Fluid  Diet effective now                            Consultants: Cardiologist  Procedures: Plan for thoracentesis    Medications:    amiodarone  100 mg Oral QODAY   baclofen  5 mg Oral BID   docusate sodium  100 mg Oral BID   gabapentin  300 mg Oral Daily   gabapentin  600 mg Oral QHS   heparin injection (subcutaneous)  5,000 Units Subcutaneous Q8H   levothyroxine  75 mcg Oral QAC breakfast   liothyronine  5 mcg Oral Daily   midodrine  10 mg Oral BID WC   multivitamin with minerals  1 tablet Oral Daily   pantoprazole  40 mg Oral Daily   polyethylene glycol  17 g Oral Daily   Continuous Infusions:  cefTRIAXone (ROCEPHIN)  IV 1 g (12/31/22 2135)  Anti-infectives (From admission, onward)    Start     Dose/Rate Route Frequency Ordered Stop   12/31/22 2200  fluconazole (DIFLUCAN) tablet 50 mg  Status:  Discontinued       See Hyperspace for full Linked Orders Report.   50 mg Oral Daily at bedtime 12/30/22 2222 12/31/22 0755   12/30/22 2315  fluconazole (DIFLUCAN) tablet 100 mg       See Hyperspace for full Linked Orders Report.   100 mg Oral  Once 12/30/22 2222 12/30/22 2250   12/30/22 2245  cefTRIAXone (ROCEPHIN) 1 g in sodium chloride 0.9 % 100 mL IVPB        1 g 200 mL/hr over 30 Minutes Intravenous Daily at bedtime  12/30/22 2230                Family Communication/Anticipated D/C date and plan/Code Status   DVT prophylaxis: heparin injection 5,000 Units Start: 12/31/22 1400 Place TED hose Start: 12/31/22 1019 SCDs Start: 12/30/22 2222     Code Status: DNR  Family Communication: Plan discussed with her husband and Corrie Dandy, daughter, at the bedside. Disposition Plan: Plan to discharge home in 2 to 3 days   Status is: Inpatient Remains inpatient appropriate because: CHF exacerbation, pleural effusion       Subjective:   Interval events noted. No complaints.  Breathing is better.  Husband and Mary at bedside.  Family was concerned that patient has been sleeping too much since yesterday.  Patient was awakened and she said that she was woken up early this morning  Objective:    Vitals:   12/31/22 2300 01/01/23 0442 01/01/23 0500 01/01/23 0800  BP: (!) 106/48 (!) 119/93    Pulse: (!) 48 (!) 48    Resp: 14 16    Temp: 97.7 F (36.5 C) 98 F (36.7 C)  97.7 F (36.5 C)  TempSrc: Oral Oral  Oral  SpO2: 91% 94%    Weight:   53.7 kg    No data found.   Intake/Output Summary (Last 24 hours) at 01/01/2023 9604 Last data filed at 01/01/2023 0400 Gross per 24 hour  Intake --  Output 1600 ml  Net -1600 ml   Filed Weights   01/01/23 0500  Weight: 53.7 kg    Exam:  GEN: NAD SKIN: Warm and dry EYES: No pallor or icterus ENT: MMM CV: RRR PULM:Decreased air entry L>R, no wheezing or rales heard ABD: soft, ND, NT, +BS CNS: AAO x 3, non focal EXT: B/l leg edema , no tenderness GU: Foley Catheter draining amber urine     Data Reviewed:   I have personally reviewed following labs and imaging studies:  Labs: Labs show the following:   Basic Metabolic Panel: Recent Labs  Lab 12/27/22 0943 12/30/22 1101 12/30/22 1711 12/30/22 2246 12/31/22 0131 01/01/23 0301  NA 138 142  --  141 139 139  K 3.9 3.9  --  3.7 3.5 4.3  CL 105 108  --  106 109 106  CO2 23 24  --   21* 22 23  GLUCOSE 103* 92  --  100* 100* 112*  BUN 48* 29*  --  24* 22 20  CREATININE 0.61 0.63  --  0.59 0.61 0.57  CALCIUM 8.6* 9.5  --  8.7* 8.4* 8.6*  MG  --   --  2.3  --  2.1 2.3  PHOS  --   --   --   --  4.9*  --  GFR Estimated Creatinine Clearance: 39.9 mL/min (by C-G formula based on SCr of 0.57 mg/dL). Liver Function Tests: Recent Labs  Lab 12/27/22 0943 12/30/22 2246 12/31/22 0131  AST 24 16 16   ALT 25 17 16   ALKPHOS 53 45 44  BILITOT 0.7 0.6 0.5  PROT 6.2* 5.4* 5.1*  ALBUMIN 3.1* 2.8* 2.7*   Recent Labs  Lab 12/27/22 0943  LIPASE 26   No results for input(s): "AMMONIA" in the last 168 hours. Coagulation profile Recent Labs  Lab 12/27/22 0943  INR 1.2    CBC: Recent Labs  Lab 12/27/22 0943 12/30/22 1101 12/31/22 0131 01/01/23 0301  WBC 9.7 10.7* 8.9 7.1  NEUTROABS 8.2* 9.1*  --  5.7  HGB 11.8* 13.4 11.9* 11.6*  HCT 37.9 41.9 36.5 36.4  MCV 92.4 90.3 89.7 89.9  PLT 367 452* 435* 443*   Cardiac Enzymes: No results for input(s): "CKTOTAL", "CKMB", "CKMBINDEX", "TROPONINI" in the last 168 hours. BNP (last 3 results) No results for input(s): "PROBNP" in the last 8760 hours. CBG: No results for input(s): "GLUCAP" in the last 168 hours. D-Dimer: No results for input(s): "DDIMER" in the last 72 hours. Hgb A1c: No results for input(s): "HGBA1C" in the last 72 hours. Lipid Profile: No results for input(s): "CHOL", "HDL", "LDLCALC", "TRIG", "CHOLHDL", "LDLDIRECT" in the last 72 hours. Thyroid function studies: Recent Labs    12/30/22 2246  TSH 5.150*   Anemia work up: No results for input(s): "VITAMINB12", "FOLATE", "FERRITIN", "TIBC", "IRON", "RETICCTPCT" in the last 72 hours. Sepsis Labs: Recent Labs  Lab 12/27/22 0943 12/30/22 1101 12/30/22 2246 12/31/22 0131 01/01/23 0301  PROCALCITON  --   --  <0.10  --   --   WBC 9.7 10.7*  --  8.9 7.1    Microbiology Recent Results (from the past 240 hour(s))  Urine Culture     Status: None    Collection Time: 12/27/22  1:34 PM   Specimen: Urine, Catheterized  Result Value Ref Range Status   Specimen Description   Final    URINE, CATHETERIZED Performed at [facility], [address] [place], [place] [address]    Special Requests   Final    NONE Performed at [facility], [address] [place], [place] [address]    Culture   Final    NO GROWTH Performed at [facility], [address] [place], [place] [address]    Report Status 12/28/2022 FINAL  Final    Procedures and diagnostic studies:  ECHOCARDIOGRAM COMPLETE  Result Date: 12/31/2022    ECHOCARDIOGRAM REPORT   Patient Name:   EKATERINI CAPITANO Fok Date of Exam: 12/31/2022 Medical Rec #:  [id number]          Height:       62.0 in Accession #:    [id number]         Weight:       116.8 lb Date of Birth:  [birth year]         BSA:          1.521 m Patient Age:    88 years           BP:           86/57 mmHg Patient Gender: F                  HR:           49 bpm. Exam Location:  Inpatient Procedure: 2D Echo,  Cardiac Doppler, Color Doppler and Strain Analysis Indications:     CHF - Acute Diastolic  History:         Patient has prior history of Echocardiogram examinations, most                  recent 09/04/2022. CHF and Cardiomyopathy, MS, Arrythmias:Atrial                  Fibrillation; Signs/Symptoms:acute respiratory failure and                  Shortness of Breath.  Sonographer:     Wallie Char Referring Phys:  Kenn File DOUTOVA Diagnosing Phys: Thomasene Ripple DO  Sonographer Comments: Technically difficult study due to poor echo windows. Image acquisition challenging due to patient body habitus. Global longitudinal strain was attempted. IMPRESSIONS  1. Know history of HCM, increased resting gradient across the LVOT. Left ventricular ejection fraction, by estimation, is 55 to 60%. The left ventricle has normal function. The left ventricle demonstrates global hypokinesis. There is severe asymmetric  left ventricular hypertrophy of the septal segment. Left ventricular diastolic function could not be evaluated. Elevated left ventricular end-diastolic pressure. The average left ventricular global longitudinal strain is -14.1 %. The global longitudinal strain is abnormal.  2. Right ventricular systolic function was not well visualized. The right ventricular size is normal.  3. Left atrial size was severely dilated.  4. The mitral valve is degenerative. Moderate mitral valve regurgitation. No evidence of mitral stenosis. Moderate mitral annular calcification.  5. The aortic valve is normal in structure. Aortic valve regurgitation is trivial. No aortic stenosis is present.  6. The inferior vena cava is dilated in size with >50% respiratory variability, suggesting right atrial pressure of 8 mmHg. FINDINGS  Left Ventricle: Know history of HCM, increased resting gradient across the LVOT. Left ventricular ejection fraction, by estimation, is 55 to 60%. The left ventricle has normal function. The left ventricle demonstrates global hypokinesis. The average left ventricular global longitudinal strain is -14.1 %. The global longitudinal strain is abnormal. The left ventricular internal cavity size was normal in size. There is severe asymmetric left ventricular hypertrophy of the septal segment. Left ventricular diastolic function could not be evaluated due to mitral annular calcification (moderate or greater). Left ventricular diastolic function could not be evaluated. Elevated left ventricular end-diastolic pressure. Right Ventricle: The right ventricular size is normal. No increase in right ventricular wall thickness. Right ventricular systolic function was not well visualized. Left Atrium: Left atrial size was severely dilated. Right Atrium: Right atrial size was normal in size. Pericardium: There is no evidence of pericardial effusion. Mitral Valve: The mitral valve is degenerative in appearance. Moderate mitral annular  calcification. Moderate mitral valve regurgitation. No evidence of mitral valve stenosis. MV peak gradient, 8.2 mmHg. The mean mitral valve gradient is 2.0 mmHg. Tricuspid Valve: The tricuspid valve is normal in structure. Tricuspid valve regurgitation is not demonstrated. No evidence of tricuspid stenosis. Aortic Valve: The aortic valve is normal in structure. Aortic valve regurgitation is trivial. No aortic stenosis is present. Aortic valve mean gradient measures 53.0 mmHg. Aortic valve peak gradient measures 81.7 mmHg. Aortic valve area, by VTI measures 2.17 cm. Pulmonic Valve: The pulmonic valve was normal in structure. Pulmonic valve regurgitation is not visualized. No evidence of pulmonic stenosis. Aorta: The aortic root is normal in size and structure. Venous: The inferior vena cava is dilated in size with greater than 50% respiratory variability, suggesting right atrial pressure  of 8 mmHg. IAS/Shunts: No atrial level shunt detected by color flow Doppler.  LEFT VENTRICLE PLAX 2D LVIDd:         3.50 cm      Diastology LVIDs:         2.80 cm      LV e' medial:    4.08 cm/s LV PW:         1.00 cm      LV E/e' medial:  22.3 LV IVS:        1.60 cm      LV e' lateral:   4.40 cm/s LVOT diam:     2.00 cm      LV E/e' lateral: 20.7 LV SV:         320 LV SV Index:   211          2D Longitudinal Strain LVOT Area:     3.14 cm     2D Strain GLS Avg:     -14.1 %  LV Volumes (MOD) LV vol d, MOD A2C: 133.0 ml LV vol d, MOD A4C: 109.0 ml LV vol s, MOD A2C: 71.7 ml LV vol s, MOD A4C: 50.4 ml LV SV MOD A2C:     61.3 ml LV SV MOD A4C:     109.0 ml LV SV MOD BP:      62.9 ml RIGHT VENTRICLE         IVC TAPSE (M-mode): 2.1 cm  IVC diam: 3.00 cm LEFT ATRIUM             Index        RIGHT ATRIUM           Index LA diam:        3.00 cm 1.97 cm/m   RA Area:     10.60 cm LA Vol (A2C):   64.5 ml 42.41 ml/m  RA Volume:   22.10 ml  14.53 ml/m LA Vol (A4C):   85.3 ml 56.08 ml/m LA Biplane Vol: 76.4 ml 50.23 ml/m  AORTIC VALVE AV  Area (Vmax):    1.71 cm AV Area (Vmean):   1.84 cm AV Area (VTI):     2.17 cm AV Vmax:           452.00 cm/s AV Vmean:          344.000 cm/s AV VTI:            1.480 m AV Peak Grad:      81.7 mmHg AV Mean Grad:      53.0 mmHg LVOT Vmax:         246.00 cm/s LVOT Vmean:        201.000 cm/s LVOT VTI:          1.020 m LVOT/AV VTI ratio: 0.69 MITRAL VALVE MV Area (PHT): 1.96 cm     SHUNTS MV Peak grad:  8.2 mmHg     Systemic VTI:  1.02 m MV Mean grad:  2.0 mmHg     Systemic Diam: 2.00 cm MV Vmax:       1.43 m/s MV Vmean:      70.1 cm/s MV Decel Time: 387 msec MR Peak grad: 248.4 mmHg MR Mean grad: 155.0 mmHg MR Vmax:      788.00 cm/s MR Vmean:     569.0 cm/s MV E velocity: 90.90 cm/s MV A velocity: 138.00 cm/s MV E/A ratio:  0.66 Kardie Tobb DO Electronically signed by Thomasene Ripple DO Signature Date/Time: 12/31/2022/11:29:14 AM  Final (Updated)    CT Angio Chest PE W and/or Wo Contrast  Result Date: 12/30/2022 CLINICAL DATA:  Shortness of breath for 2 weeks EXAM: CT ANGIOGRAPHY CHEST WITH CONTRAST TECHNIQUE: Multidetector CT imaging of the chest was performed using the standard protocol during bolus administration of intravenous contrast. Multiplanar CT image reconstructions and MIPs were obtained to evaluate the vascular anatomy. RADIATION DOSE REDUCTION: This exam was performed according to the departmental dose-optimization program which includes automated exposure control, adjustment of the mA and/or kV according to patient size and/or use of iterative reconstruction technique. CONTRAST:  75mL OMNIPAQUE IOHEXOL 350 MG/ML SOLN COMPARISON:  None Available. FINDINGS: Cardiovascular: No evidence of pulmonary embolus.Mild cardiomegaly. No pericardial effusion. Normal caliber thoracic aorta with moderate atherosclerotic disease. Mediastinum/Nodes: Small hiatal hernia. Left-sided thyroid nodule measuring 14 mm, no follow-up imaging is necessary. Enlarged lymph nodes seen in the chest. Lungs/Pleura: Central airways  are patent. Moderate right and large left pleural effusions with associated atelectasis. Right upper lobe predominant ground-glass opacities and septal thickening. Scattered solid pulmonary nodules. Largest measures 6 mm in the right lower lobe on series 8, image 32. Upper Abdomen: Gallstones. Hyperdense material seen in the gallbladder, likely due to sludge. No acute abnormality. Musculoskeletal: Soft tissue lesion of the medial left chest wall measuring 2.8 x 1.5 cm on series 6, image 38 located between the costochondral cartilage of the left first and second ribs. No aggressive appearing osseous lesions. Review of the MIP images confirms the above findings. IMPRESSION: 1. No evidence of pulmonary embolus. 2. Moderate right and large left pleural effusions with associated atelectasis. 3. Soft tissue lesion of the medial left chest located between the costochondral cartilage of the first and second ribs, concerning for metastatic disease, although benign etiology also possible. Based on goals of care, finding can be further evaluated with soft tissue ultrasound and tissue sampling and/or PET-CT. 4. Right upper lobe predominant ground-glass opacities and septal thickening, likely due to pulmonary edema. 5. Scattered solid pulmonary nodules measuring up to 6 mm. Non-contrast chest CT at 3-6 months is recommended. If the nodules are stable at time of repeat CT, then future CT at 18-24 months (from today's scan) is considered optional for low-risk patients, but is recommended for high-risk patients. This recommendation follows the consensus statement: Guidelines for Management of Incidental Pulmonary Nodules Detected on CT Images: From the Fleischner Society 2017; Radiology 2017; 284:228-243. 6. Aortic Atherosclerosis (ICD10-I70.0). Electronically Signed   By: Allegra Lai M.D.   On: 12/30/2022 13:40   DG Chest Port 1 View  Result Date: 12/30/2022 CLINICAL DATA:  Shortness of breath for the past 2 weeks. EXAM:  PORTABLE CHEST 1 VIEW COMPARISON:  Chest x-ray dated December 24, 2022. FINDINGS: The heart borders are partially obscured but remain mildly enlarged. Interstitial edema in the right lung has improved. Unchanged moderate left and small right pleural effusions with left-greater-than-right basilar atelectasis. No pneumothorax. No acute osseous abnormality. IMPRESSION: 1. Unchanged moderate left and small right pleural effusions with bibasilar atelectasis. Electronically Signed   By: Obie Dredge M.D.   On: 12/30/2022 11:34               LOS: 2 days   Joelee Snoke  [facility]   Pager on [email or web address]. If 7PM-7AM, please contact night-coverage at [email or web address]     01/01/2023, 9:06 AM

## 2023-01-01 NOTE — Plan of Care (Signed)
  Problem: Education: Goal: Knowledge of General Education information will improve Description Including pain rating scale, medication(s)/side effects and non-pharmacologic comfort measures Outcome: Progressing   Problem: Health Behavior/Discharge Planning: Goal: Ability to manage health-related needs will improve Outcome: Progressing   

## 2023-01-01 NOTE — Plan of Care (Signed)

## 2023-01-01 NOTE — TOC CM/SW Note (Signed)
Transition of Care [facility]) - Inpatient Brief Assessment   Patient Details  Name: Tina Webb MRN: [id number] Date of Birth: 1933-11-15  [facility]) CM/SW Contact:    Gordy Clement, RN Phone Number: 01/01/2023, 10:07 AM   Clinical Narrative:  Patient is from home with Husband.  Husband assists with bathing, dressing and ADL's  All DME is in the home.  PT and OT recommended no additional needs. PCP is established and patient is insured. Family will transport at DC   [facility] will continue to follow patient for any additional discharge needs    Transition of Care Asessment: Insurance and Status: Insurance coverage has been reviewed (Medicare A and B) Patient has primary care physician: Yes [name], Nolon Bussing, MD) Home environment has been reviewed: Pt lives at home with husband who assistance with bed mobility, transfers, dressing, has home aide 4X/wk for bathing/IADLs. Prior level of function:: Dependent on Husband for Above Prior/Current Home Services: No current home services Social Determinants of Health Reivew: SDOH reviewed no interventions necessary Readmission risk has been reviewed: Yes (15%) Transition of care needs: transition of care needs identified, TOC will continue to follow

## 2023-01-01 NOTE — Progress Notes (Signed)
Occupational Therapy Treatment Patient Details Name: Tina Webb MRN: [id number] DOB: [birth year] Today's Date: 01/01/2023   History of present illness 89 y.o. female presented to the ED 6/24 with SOB. Pt admitted with acute on chronic CHF. PMHx includes A. Fib., GERD, MS, wheelchair bound, and cutaneous T cell lymphoma.   OT comments  Pt in good spirits, feeling good today, some nausea throughout session. Pt was able to tolerate sitting EOB for up to ~18 minutes without O2, BO 92 on average, began to dip to 86 once supine back in bed, unable to recover on room air, recovered within 1 minute with [place] O2. Pt tolerated session well, performed core exercises sitting EOB, good tolerance, supervision for sitting EOB. Pt max A for bed mobility, total for BLE support, limited trunk control and UB strength to assist. Pt DC plan still appropriate, would benefit from continued acute OT to further improve activity tolerance and overall strength.    Recommendations for follow up therapy are one component of a multi-disciplinary discharge planning process, led by the attending physician.  Recommendations may be updated based on patient status, additional functional criteria and insurance authorization.    Assistance Recommended at Discharge Frequent or constant Supervision/Assistance  Patient can return home with the following  A little help with walking and/or transfers;A little help with bathing/dressing/bathroom;Assistance with cooking/housework;Assist for transportation;Help with stairs or ramp for entrance   Equipment Recommendations  None recommended by OT    Recommendations for Other Services      Precautions / Restrictions Precautions Precautions: Fall;Other (comment) Precaution Comments: watch BP and O2, B heel wounds Restrictions Weight Bearing Restrictions: No       Mobility Bed Mobility Overal bed mobility: Needs Assistance Bed Mobility: Supine to Sit, Sit to Supine      Supine to sit: Max assist, HOB elevated Sit to supine: Max assist   General bed mobility comments: significant assistance for sitting up, total A for BLEs, minimal ability to perform any part of bed mobility without significant assistance    Transfers Overall transfer level: Needs assistance   Transfers: Sit to/from Stand Sit to Stand: Total assist           General transfer comment: Attempted multiple STSs at EOB, using gait belt, Pt total A for standing, not able to stand straight or provide assistance     Balance Overall balance assessment: Needs assistance Sitting-balance support: Single extremity supported, Feet supported Sitting balance-Leahy Scale: Fair Sitting balance - Comments: sitting EOB performing EOB core exercises, increased time for leaning Postural control: Left lateral lean   Standing balance-Leahy Scale: Zero Standing balance comment: total A for STS using gait belt                           ADL either performed or assessed with clinical judgement   ADL                                              Extremity/Trunk Assessment              Vision       Perception     Praxis      Cognition Arousal/Alertness: Awake/alert Behavior During Therapy: WFL for tasks assessed/performed Overall Cognitive Status: Within Functional Limits for tasks assessed  General Comments: A&Ox4, pleasant throughout session        Exercises Other Exercises Other Exercises: trunk flexion/lateral flexion and extension    Shoulder Instructions       General Comments      Pertinent Vitals/ Pain       Pain Assessment Pain Assessment: No/denies pain  Home Living                                          Prior Functioning/Environment              Frequency  Min 2X/week        Progress Toward Goals  OT Goals(current goals can now be found in the care  plan section)  Progress towards OT goals: Progressing toward goals  Acute Rehab OT Goals Patient Stated Goal: to return home OT Goal Formulation: With patient/family Time For Goal Achievement: 01/14/23 Potential to Achieve Goals: Good ADL Goals Pt Will Transfer to Toilet: with mod assist;bedside commode Pt/caregiver will Perform Home Exercise Program: Increased strength;Both right and left upper extremity;With theraband;With written HEP provided Additional ADL Goal #1: Pt will be able to sit EOB for up to 10 mins as needed to participate in ADLs and prepare for transfers with supervision using BUEs for support as needed  Plan Discharge plan remains appropriate    Co-evaluation                 AM-PAC OT "6 Clicks" Daily Activity     Outcome Measure   Help from another person eating meals?: A Little Help from another person taking care of personal grooming?: A Little Help from another person toileting, which includes using toliet, bedpan, or urinal?: A Lot Help from another person bathing (including washing, rinsing, drying)?: A Lot Help from another person to put on and taking off regular upper body clothing?: A Lot Help from another person to put on and taking off regular lower body clothing?: Total 6 Click Score: 13    End of Session Equipment Utilized During Treatment: Oxygen;Gait belt  OT Visit Diagnosis: Unsteadiness on feet (R26.81);Other abnormalities of gait and mobility (R26.89);Muscle weakness (generalized) (M62.81);Other symptoms and signs involving the nervous system (R29.898)   Activity Tolerance Patient tolerated treatment well   Patient Left in bed;with call bell/phone within reach;with family/visitor present   Nurse Communication Mobility status        Time: 1610-9604 OT Time Calculation (min): 27 min  Charges: OT General Charges $OT Visit: 1 Visit OT Treatments $Therapeutic Activity: 8-22 mins $Therapeutic Exercise: 8-22 mins  Takesha Steger,  OTR/L   Genevive Printup R Diogo Anne 01/01/2023, 1:45 PM

## 2023-01-01 NOTE — Consult Note (Signed)
Consultation Note Date: 01/01/2023   Patient Name: Tina Webb  DOB: 07-17-1933  MRN: [id number]  Age / Sex: 89 y.o., female  PCP: Nadara Eaton, MD Referring Physician: Lurene Shadow, MD  Reason for Consultation: Establishing goals of care  HPI/Patient Profile: 89 y.o. 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   Clinical Assessment and Goals of Care: I have reviewed medical records including EPIC notes, labs and imaging, received report from RN.  Remote consult.    Cardiology note shares that breathing is improved with IV diuresis but not back to baseline.  Chest x-ray with bilateral pleural effusion.  Requesting additional aortic valve images although not considered to be a candidate for corrective procedures.  Recommending conservative medical treatment at this time.  Bradycardia with underlying paroxysmal A-fib currently in sinus brady.  Reassuming Eliquis when safe.  Call to Mrs. Egelhoff.  Phone answered by daughter, Shon Hale (patient is away for thoracentesis and spouse is at his own medical appointment) to discuss diagnosis prognosis, GOC, EOL wishes, disposition and options. I introduced Palliative Medicine as specialized medical care for people living with serious illness. It focuses on providing relief from the symptoms and stress of a serious illness. The goal is to improve quality of life for both the patient and the family.  We discussed a brief life review of the patient.  Mr. Mrs. Brisbon have lived at [facility] for the last 3 years.  Per notes her husband assists with bathing,  dressing, ADLs in part due to patient's history of multiple sclerosis.  It seems that they have all needed DME at this time.  We then focused on their current illness.  We talk about heart failure and the treatment plan.  We talked about further testing, medical management, outpatient follow-ups.  The natural disease trajectory and expectations at EOL were discussed.  Advanced directives, concepts specific to code status, artifical feeding and hydration, and rehospitalization were not discussed today as Mrs. Wind is known to be DNR.  Palliative Care services outpatient were explained and offered.  Shon Hale and I talk about the differences between palliative care and hospice care.  Mrs. Barbeau is active with [facility] health system outpatient palliative team.  She is followed by Sharyne Richters, NP.  It seems that she has been active since May 30 of this year.  Last contact was June 14 of this year.  Discussed the importance of continued conversation with family and the medical providers regarding overall plan of care and treatment options, ensuring decisions are within the context of the patient's values and GOCs.  Questions and concerns were addressed. The family was encouraged to call with questions or concerns.  PMT will continue to support holistically.  Conference with attending, bedside nursing staff, transition of care team related to patient  condition, needs, goals of care, disposition.    HCPOA  NEXT OF KIN -spouse, [name] Grieser    SUMMARY OF RECOMMENDATIONS   At this point continue to treat the treatable but no CPR or intubation. Time for outcomes. Return home. Active with [facility] outpatient palliative services.   Code Status/Advance Care Planning: DNR  Symptom Management:  Per hospitalist, no additional needs at this time.  Palliative Prophylaxis:  Frequent Pain Assessment and Palliative Wound Care  Additional Recommendations (Limitations, Scope,  Preferences): Continue to treat the treatable but no CPR or intubation  Psycho-social/Spiritual:  Desire for further Chaplaincy support:no Additional Recommendations: Caregiving  Support/Resources  Prognosis:  Unable to determine, based on outcomes.  6 months or less would not be surprising based on chronic illness burden, poor functional status.  Discharge Planning: Home with Home Health      Primary Diagnoses: Present on Admission:  Acute on chronic diastolic CHF (congestive heart failure) (HCC)  Multiple sclerosis (HCC)  Hypothyroidism  Soft tissue mass  Bilateral pleural effusion  Acute respiratory failure with hypoxia (HCC)  UTI (urinary tract infection)  Paroxysmal atrial fibrillation (HCC)  Leg wound, left  Constipation   I have reviewed the medical record, interviewed the patient and family, and examined the patient. The following aspects are pertinent.  Past Medical History:  Diagnosis Date   Anxiety    High cholesterol    Hypertrophic cardiomyopathy (HCC)    Multiple sclerosis (HCC)    Paroxysmal A-fib (HCC)    Rotator cuff tear    left   Social History   Socioeconomic History   Marital status: Married    Spouse name: [name]   Number of children: 3   Years of education: [employer] education level: Not on file  Occupational History   Occupation: Retired  Tobacco Use   Smoking status: Never   Smokeless tobacco: Never  Vaping Use   Vaping Use: Never used  Substance and Sexual Activity   Alcohol use: Yes    Comment: 2 drinks per week   Drug use: Never   Sexual activity: Not Currently  Other Topics Concern   Not on file  Social History Narrative   Originally right handed but had surgery and now uses left hand for eating and other activities   Caffeine use: 2 cups coffee per day   Lives with husband   Lives at [facility] with husband in [place]   Social Determinants of Health   Financial Resource Strain: Not on file  Food  Insecurity: Not on file  Transportation Needs: Not on file  Physical Activity: Not on file  Stress: Not on file  Social Connections: Not on file   Family History  Problem Relation Age of Onset   Heart attack Mother    Transient ischemic attack Mother    Arthritis Mother    Heart Problems Father    Scheduled Meds:  amiodarone  100 mg Oral QODAY   baclofen  5 mg Oral BID   dalfampridine  10 mg Oral BID   docusate sodium  100 mg Oral BID   gabapentin  300 mg Oral Daily   gabapentin  600 mg Oral QHS   heparin injection (subcutaneous)  5,000 Units Subcutaneous Q8H   levothyroxine  75 mcg Oral QAC breakfast   liothyronine  5 mcg Oral Daily   midodrine  10 mg Oral BID WC   multivitamin with minerals  1 tablet Oral Daily   pantoprazole  40 mg Oral Daily  polyethylene glycol  17 g Oral Daily   Continuous Infusions:  cefTRIAXone (ROCEPHIN)  IV 1 g (12/31/22 2135)   PRN Meds:.acetaminophen **OR** acetaminophen, albuterol, bisacodyl, HYDROcodone-acetaminophen, hydrocortisone cream, ondansetron **OR** ondansetron (ZOFRAN) IV Medications Prior to Admission:  Prior to Admission medications   Medication Sig Start Date End Date Taking? Authorizing Provider  amiodarone (PACERONE) 200 MG tablet Take 0.5 tablets (100 mg total) by mouth daily. Patient taking differently: Take 100 mg by mouth every other day. 11/20/22  Yes Yates Decamp, MD  AMPYRA 10 MG TB12 Take 1 tablet (10 mg total) by mouth in the morning and at bedtime. BRAND NAME AMPYRA MEDICALLY NECESSARY Patient taking differently: Take 10 mg by mouth in the morning and at bedtime. 10/02/22  Yes Sater, Pearletha Furl, MD  apixaban (ELIQUIS) 2.5 MG TABS tablet Take 1 tablet (2.5 mg total) by mouth 2 (two) times daily. 02/25/22 12/31/22 Yes Cristopher Peru, PA-C  baclofen (LIORESAL) 10 MG tablet TAKE 1/2 TABLET TWICE DAILY 10/09/22  Yes Sater, Pearletha Furl, MD  cephALEXin (KEFLEX) 500 MG capsule Take 1 capsule (500 mg total) by mouth 3 (three) times daily  for 7 days. 12/27/22 01/03/23 Yes Tanda Rockers A, DO  clobetasol cream (TEMOVATE) 0.05 % Apply 1 application  topically daily as needed (Rash).   Yes [provider]  Cranberry-Vitamin C-Probiotic (AZO CRANBERRY PO) Take 1 tablet by mouth daily.   Yes [provider]  Desoximetasone (TOPICORT) 0.25 % ointment Apply 1 Application topically 2 (two) times daily. 12/23/22 12/23/23 Yes [provider]  diclofenac Sodium (VOLTAREN) 1 % GEL Apply 2 g topically as needed (joint pain).   Yes [provider]  estradiol (ESTRING) 2 MG vaginal ring Place 2 mg vaginally every 3 (three) months. follow package directions   Yes [provider]  folic acid (FOLVITE) 1 MG tablet Take 1 mg by mouth daily.   Yes [provider]  gabapentin (NEURONTIN) 300 MG capsule Take 300-600 mg by mouth See admin instructions. Take 300 mg by mouth in the morning and then take 600 mg by mouth in the evening   Yes [provider]  hydrocortisone 2.5 % cream Apply 1 application  topically daily as needed (For rash).   Yes [provider]  levothyroxine (SYNTHROID) 75 MCG tablet Take 75 mcg by mouth daily before breakfast. 12/23/22  Yes [provider]  liothyronine (CYTOMEL) 5 MCG tablet Take 10 mcg by mouth daily.   Yes [provider]  methenamine (HIPREX) 1 g tablet Take 1 g by mouth daily.   Yes [provider]  mupirocin cream (BACTROBAN) 2 % Apply 1 application topically as needed.   Yes [provider]  nitrofurantoin (MACRODANTIN) 50 MG capsule Take 50 mg by mouth daily.   Yes [provider]  pantoprazole (PROTONIX) 40 MG tablet Take 40 mg by mouth daily. 04/01/22  Yes [provider]  polyethylene glycol (MIRALAX / GLYCOLAX) 17 g packet Take 17 g by mouth daily as needed for mild constipation.   Yes [provider]  Probiotic Product (PROBIOTIC PO) Take 2 tablets by mouth daily.   Yes [provider]  propranolol ER (INDERAL LA) 80 MG 24 hr capsule Take 1 capsule (80 mg total) by mouth daily. 12/11/22  Yes Yates Decamp, MD  solifenacin (VESICARE) 5 MG tablet Take 1 tablet by mouth daily. 02/21/22  Yes [provider]  VITAMIN D PO Take 4,000 Units by mouth daily.   Yes [provider]  zolpidem (AMBIEN) 10 MG tablet Take 10 mg by mouth at bedtime as needed for sleep.   Yes [provider]  methotrexate (RHEUMATREX) 2.5 MG tablet Take 15 mg by mouth once a week. Caution:Chemotherapy. Protect from light. 6 tablets weekley    [provider]  nitrofurantoin (MACRODANTIN) 100 MG capsule Take 50 mg by mouth at bedtime. Patient not taking: Reported on 12/31/2022    [provider]  triamcinolone cream (KENALOG) 0.1 % Apply 1 application topically as needed. Patient not taking: Reported on 12/31/2022    [provider]   No Known Allergies Review of Systems  Unable to perform ROS: Age    Physical Exam Vitals and nursing note reviewed.     Vital Signs: BP (!) 119/93 (BP Location: Right Arm)   Pulse (!) 48   Temp 97.7 F (36.5 C) (Oral)   Resp 16   Wt 53.7 kg   SpO2 92%   BMI 21.65 kg/m  Pain Scale: 0-10 POSS *See Group Information*: S-Acceptable,Sleep, easy to arouse Pain Score: 0-No pain   SpO2: SpO2: 92 % O2 Device:SpO2: 92 % O2 Flow Rate: .O2 Flow Rate (L/min): 2 L/min  IO: Intake/output summary:  Intake/Output Summary (Last 24 hours) at 01/01/2023 1227 Last data filed at 01/01/2023 0400 Gross per 24 hour  Intake --  Output 1600 ml  Net -1600 ml    LBM: Last BM Date : 12/30/22 Baseline Weight: Weight: 53.7 kg Most recent weight: Weight: 53.7 kg     Palliative Assessment/Data:     Time In: 1205 Time Out: 1305 Time Total: 60 minutes  Greater than 50%  of this time was spent counseling and coordinating care related to the above assessment and plan.  Signed by: Katheran Awe, NP   Please contact  Palliative Medicine Team phone at [phone] for questions and concerns.  For individual provider: See Loretha Stapler

## 2023-01-01 NOTE — Progress Notes (Signed)
Patient is due to void after foley removal at around 11am.Bladder scanned at 1630 was .MD Lurene Shadow) made aware.Intructed to wait little bit more .Night shift RN made aware.

## 2023-01-01 NOTE — Progress Notes (Signed)
Pt has not voided since foley catheter discontinued at approximately 1130 am today, bladder scans have been <200 ml, per Dr. Myriam Forehand at 1927-instruction was to give pt more time to void. After review of chart looks like pt here for CHF, with recent thoracentesis today. However pt was admitted with foley catheter which was placed outpatient due to hematuria (gross clots) and is being treated for UTI. Per notes, hematuria resolved, so foley was discontinued today. Bladder scan at this time . Abdomen non-distended, no pain. Dr. Antionette Char notified of the above, no new orders at this time.

## 2023-01-02 DIAGNOSIS — I5033 Acute on chronic diastolic (congestive) heart failure: Secondary | ICD-10-CM | POA: Diagnosis not present

## 2023-01-02 DIAGNOSIS — J90 Pleural effusion, not elsewhere classified: Secondary | ICD-10-CM | POA: Diagnosis not present

## 2023-01-02 DIAGNOSIS — G35 Multiple sclerosis: Secondary | ICD-10-CM

## 2023-01-02 LAB — CBC WITH DIFFERENTIAL/PLATELET
Abs Immature Granulocytes: 0.04 10*3/uL (ref 0.00–0.07)
Basophils Absolute: 0.1 10*3/uL (ref 0.0–0.1)
Basophils Relative: 1 %
Eosinophils Absolute: 0.3 10*3/uL (ref 0.0–0.5)
Eosinophils Relative: 3 %
HCT: 38.6 % (ref 36.0–46.0)
Hemoglobin: 12.4 g/dL (ref 12.0–15.0)
Immature Granulocytes: 0 %
Lymphocytes Relative: 8 %
Lymphs Abs: 0.7 10*3/uL (ref 0.7–4.0)
MCH: 29.4 pg (ref 26.0–34.0)
MCHC: 32.1 g/dL (ref 30.0–36.0)
MCV: 91.5 fL (ref 80.0–100.0)
Monocytes Absolute: 0.6 10*3/uL (ref 0.1–1.0)
Monocytes Relative: 7 %
Neutro Abs: 7.2 10*3/uL (ref 1.7–7.7)
Neutrophils Relative %: 81 %
Platelets: 430 10*3/uL — ABNORMAL HIGH (ref 150–400)
RBC: 4.22 MIL/uL (ref 3.87–5.11)
RDW: 17.9 % — ABNORMAL HIGH (ref 11.5–15.5)
WBC: 9 10*3/uL (ref 4.0–10.5)
nRBC: 0 % (ref 0.0–0.2)

## 2023-01-02 LAB — BASIC METABOLIC PANEL
Anion gap: 6 (ref 5–15)
BUN: 20 mg/dL (ref 8–23)
CO2: 24 mmol/L (ref 22–32)
Calcium: 8.6 mg/dL — ABNORMAL LOW (ref 8.9–10.3)
Chloride: 106 mmol/L (ref 98–111)
Creatinine, Ser: 0.52 mg/dL (ref 0.44–1.00)
GFR, Estimated: >60 mL/min (ref >60)
Glucose, Bld: 96 mg/dL (ref 70–99)
Potassium: 4.6 mmol/L (ref 3.5–5.1)
Sodium: 136 mmol/L (ref 135–145)

## 2023-01-02 LAB — BRAIN NATRIURETIC PEPTIDE: B Natriuretic Peptide: 2550.8 pg/mL — ABNORMAL HIGH (ref 0.0–100.0)

## 2023-01-02 LAB — MAGNESIUM: Magnesium: 2.3 mg/dL (ref 1.7–2.4)

## 2023-01-02 MED ORDER — AMPYRA 10 MG PO TB12
10.0000 mg | ORAL_TABLET | Freq: Two times a day (BID) | ORAL | Status: DC
Start: 2023-01-02 — End: 2023-08-26

## 2023-01-02 MED ORDER — AMIODARONE HCL 200 MG PO TABS
100.0000 mg | ORAL_TABLET | ORAL | Status: DC
Start: 2023-01-02 — End: 2023-06-23

## 2023-01-02 MED ORDER — APIXABAN 2.5 MG PO TABS: 2.5000 mg | ORAL_TABLET | Freq: Two times a day (BID) | ORAL | Status: DC

## 2023-01-02 NOTE — Discharge Summary (Signed)
Physician Discharge Summary   Patient: Tina Webb MRN: [id number] DOB: 12/27/1933  Admit date:     12/30/2022  Discharge date: 01/02/23  Discharge Physician: Lurene Shadow   PCP: Nadara Eaton, MD   Recommendations at discharge:   Follow-up with palliative care team and hospice team at home Follow-up with PCP in 1 week  Discharge Diagnoses: Principal Problem:   Acute on chronic diastolic CHF (congestive heart failure) (HCC) Active Problems:   Multiple sclerosis (HCC)   Hypothyroidism   Soft tissue mass   Bilateral pleural effusion   Acute respiratory failure with hypoxia (HCC)   UTI (urinary tract infection)   Paroxysmal atrial fibrillation (HCC)   Leg wound, left   Constipation  Resolved Problems:   * No resolved hospital problems. *  Hospital Course:  Tina Webb is a 89 y.o. 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  She was recently diagnosed with UTI causing hematuria, she is finishing antibiotics for the same, her Eliquis was held on 12/27/2022 due to hematuria, urine seems to be clearing, she presented to the hospital with exertional shortness of breath.  She was diagnosed with CHF and admitted.     Assessment and Plan:   Acute on chronic diastolic CHF: Improved.  Outpatient follow-up with cardiologist as needed   Bilateral thoracentesis (large left and moderate right): S/p left-sided thoracentesis with removal of 1.2 L of fluid on 01/01/2023.       Acute hypoxic respiratory failure: Resolved.  She is tolerating room air.       Paroxysmal atrial fibrillation: Cardiologist recommended amiodarone to be scheduled every other day.  Resume Eliquis. Propranolol discontinued at discharge because of bradycardia.   Recent acute UTI and hematuria requiring placement of  indwelling Foley catheter in the outpatient setting, severe urinary incontinence:  Hematuria has resolved.  Foley catheter was discontinued on 01/01/2023 and she has been able to pass urine spontaneously.   She has a vaginal ring that needs to be replaced.  Estring vaginal ring" replaced by Dr. Clemencia Course, MD (urogynecologist).  Recommended outpatient follow-up with her urogynecologist for replacement of vaginal ring.     History of lung nodules, possible soft tissue mass on CT scan: Outpatient follow-up with PCP if desired.  Palliative care to see patient for goals of care discussion.     Other comorbidities include constipation, hypothyroidism, multiple sclerosis (wheelchair-bound)     Discussed goals of care with the patient, her husband and her daughter at the bedside.  They said they do not want any aggressive measures.  She has declined further evaluation of soft tissue mass in the lung.  She declined right-sided thoracentesis.  She also requested discharge to home today.  They requested comfort measures and hospice.  Consulted TOC  for hospice evaluation.       Consultants: Cardiologist Procedures performed: Left-sided thoracentesis Disposition: Home Diet recommendation:  Discharge Diet Orders (From admission, onward)     Start     Ordered   01/02/23 0000  Diet - low sodium heart healthy        01/02/23 1650           Cardiac diet DISCHARGE MEDICATION: Allergies as of 01/02/2023   No Known Allergies      Medication List     STOP taking these medications    cephALEXin 500 MG capsule Commonly known  as: KEFLEX   HYDROcodone-acetaminophen 5-325 MG tablet Commonly known as: Norco   propranolol ER 80 MG 24 hr capsule Commonly known as: Inderal LA   triamcinolone cream 0.1 % Commonly known as: KENALOG       TAKE these medications    amiodarone 200 MG tablet Commonly known as: PACERONE Take 0.5 tablets (100 mg total) by mouth every other day.   Ampyra  10 MG Tb12 Generic drug: dalfampridine Take 1 tablet (10 mg total) by mouth in the morning and at bedtime.   apixaban 2.5 MG Tabs tablet Commonly known as: ELIQUIS Take 1 tablet (2.5 mg total) by mouth 2 (two) times daily.   AZO CRANBERRY PO Take 1 tablet by mouth daily.   baclofen 10 MG tablet Commonly known as: LIORESAL TAKE 1/2 TABLET TWICE DAILY   clobetasol cream 0.05 % Commonly known as: TEMOVATE Apply 1 application  topically daily as needed (Rash).   Desoximetasone 0.25 % ointment Commonly known as: TOPICORT Apply 1 Application topically 2 (two) times daily.   diclofenac Sodium 1 % Gel Commonly known as: VOLTAREN Apply 2 g topically as needed (joint pain).   Estring 7.5 MCG/24HR vaginal ring Generic drug: estradiol Place 2 mg vaginally every 3 (three) months. follow package directions   folic acid 1 MG tablet Commonly known as: FOLVITE Take 1 mg by mouth daily.   gabapentin 300 MG capsule Commonly known as: NEURONTIN Take 300-600 mg by mouth See admin instructions. Take 300 mg by mouth in the morning and then take 600 mg by mouth in the evening   hydrocortisone 2.5 % cream Apply 1 application  topically daily as needed (For rash).   levothyroxine 75 MCG tablet Commonly known as: SYNTHROID Take 75 mcg by mouth daily before breakfast.   liothyronine 5 MCG tablet Commonly known as: CYTOMEL Take 10 mcg by mouth daily.   methenamine 1 g tablet Commonly known as: HIPREX Take 1 g by mouth daily.   methotrexate 2.5 MG tablet Commonly known as: RHEUMATREX Take 15 mg by mouth once a week. Caution:Chemotherapy. Protect from light. 6 tablets weekley   mupirocin cream 2 % Commonly known as: BACTROBAN Apply 1 application topically as needed.   nitrofurantoin 50 MG capsule Commonly known as: MACRODANTIN Take 50 mg by mouth daily. What changed: Another medication with the same name was removed. Continue taking this medication, and follow the directions you  see here.   pantoprazole 40 MG tablet Commonly known as: PROTONIX Take 40 mg by mouth daily.   polyethylene glycol 17 g packet Commonly known as: MIRALAX / GLYCOLAX Take 17 g by mouth daily as needed for mild constipation.   PROBIOTIC PO Take 2 tablets by mouth daily.   solifenacin 5 MG tablet Commonly known as: VESICARE Take 1 tablet by mouth daily.   VITAMIN D PO Take 4,000 Units by mouth daily.   zolpidem 10 MG tablet Commonly known as: AMBIEN Take 10 mg by mouth at bedtime as needed for sleep.               Discharge Care Instructions  (From admission, onward)           Start     Ordered   01/02/23 0000  Discharge wound care:       Comments: Continue local wound care with wound care nurse as you were doing   01/02/23 1650            Discharge Exam: Filed Weights   01/01/23 0500  01/02/23 0500  Weight: 53.7 kg 49.5 kg   GEN: NAD SKIN: Warm and dry EYES: No pallor or icterus ENT: MMM CV: RRR PULM: Improved air entry bilaterally.  No rales or wheezing ABD: soft, ND, NT, +BS CNS: AAO x 3, non focal EXT: Mild bilateral leg edema.  No tenderness   Condition at discharge: stable  The results of significant diagnostics from this hospitalization (including imaging, microbiology, ancillary and laboratory) are listed below for reference.   Imaging Studies: IR THORACENTESIS ASP PLEURAL SPACE W/IMG GUIDE  Result Date: 01/01/2023 INDICATION: Patient with a history of heart failure and atrial fibrillation presents today with pleural effusions. Interventional radiology asked to perform a therapeutic thoracentesis. EXAM: ULTRASOUND GUIDED THORACENTESIS MEDICATIONS: 1% lidocaine 10 mL COMPLICATIONS: None immediate. PROCEDURE: An ultrasound guided thoracentesis was thoroughly discussed with the patient and questions answered. The benefits, risks, alternatives and complications were also discussed. The patient understands and wishes to proceed with the  procedure. Written consent was obtained. Ultrasound was performed to localize and mark an adequate pocket of fluid in the left chest. The area was then prepped and draped in the normal sterile fashion. 1% Lidocaine was used for local anesthesia. Under ultrasound guidance a 6 Fr Safe-T-Centesis catheter was introduced. Thoracentesis was performed. The catheter was removed and a dressing applied. FINDINGS: A total of approximately 1.2 L of clear yellow fluid was removed. IMPRESSION: Successful ultrasound guided left thoracentesis yielding 1.2 L of pleural fluid. Procedure performed by Alwyn Ren NP and supervised by Dr. Deanne Coffer. Electronically Signed   By: Corlis Leak M.D.   On: 01/01/2023 14:24   DG Chest 1 View  Result Date: 01/01/2023 CLINICAL DATA:  [id number] Status post thoracentesis [id number] EXAM: CHEST  1 VIEW COMPARISON:  12/30/2022 FINDINGS: Stable cardiomegaly. Aortic atherosclerosis. Small bilateral pleural effusions, decreased on the left. Bibasilar atelectasis within proving aeration of the left lung base. No definite pneumothorax. Chronic left shoulder deformity. IMPRESSION: Small bilateral pleural effusions, decreased on the left. No definite pneumothorax. Electronically Signed   By: Duanne Guess D.O.   On: 01/01/2023 13:45   ECHOCARDIOGRAM COMPLETE  Result Date: 12/31/2022    ECHOCARDIOGRAM REPORT   Patient Name:   JAEDAH LORDS Sherburn Date of Exam: 12/31/2022 Medical Rec #:  [id number]          Height:       62.0 in Accession #:    [id number]         Weight:       116.8 lb Date of Birth:  08/12/1933         BSA:          1.521 m Patient Age:    88 years           BP:           86/57 mmHg Patient Gender: F                  HR:           49 bpm. Exam Location:  Inpatient Procedure: 2D Echo, Cardiac Doppler, Color Doppler and Strain Analysis Indications:     CHF - Acute Diastolic  History:         Patient has prior history of Echocardiogram examinations, most                  recent 09/04/2022.  CHF and Cardiomyopathy, MS, Arrythmias:Atrial  Fibrillation; Signs/Symptoms:acute respiratory failure and                  Shortness of Breath.  Sonographer:     Wallie Char Referring Phys:  Kenn File DOUTOVA Diagnosing Phys: Thomasene Ripple DO  Sonographer Comments: Technically difficult study due to poor echo windows. Image acquisition challenging due to patient body habitus. Global longitudinal strain was attempted. IMPRESSIONS  1. Know history of HCM, increased resting gradient across the LVOT. Left ventricular ejection fraction, by estimation, is 55 to 60%. The left ventricle has normal function. The left ventricle demonstrates global hypokinesis. There is severe asymmetric left ventricular hypertrophy of the septal segment. Left ventricular diastolic function could not be evaluated. Elevated left ventricular end-diastolic pressure. The average left ventricular global longitudinal strain is -14.1 %. The global longitudinal strain is abnormal.  2. Right ventricular systolic function was not well visualized. The right ventricular size is normal.  3. Left atrial size was severely dilated.  4. The mitral valve is degenerative. Moderate mitral valve regurgitation. No evidence of mitral stenosis. Moderate mitral annular calcification.  5. The aortic valve is normal in structure. Aortic valve regurgitation is trivial. No aortic stenosis is present.  6. The inferior vena cava is dilated in size with >50% respiratory variability, suggesting right atrial pressure of 8 mmHg. FINDINGS  Left Ventricle: Know history of HCM, increased resting gradient across the LVOT. Left ventricular ejection fraction, by estimation, is 55 to 60%. The left ventricle has normal function. The left ventricle demonstrates global hypokinesis. The average left ventricular global longitudinal strain is -14.1 %. The global longitudinal strain is abnormal. The left ventricular internal cavity size was normal in size. There is  severe asymmetric left ventricular hypertrophy of the septal segment. Left ventricular diastolic function could not be evaluated due to mitral annular calcification (moderate or greater). Left ventricular diastolic function could not be evaluated. Elevated left ventricular end-diastolic pressure. Right Ventricle: The right ventricular size is normal. No increase in right ventricular wall thickness. Right ventricular systolic function was not well visualized. Left Atrium: Left atrial size was severely dilated. Right Atrium: Right atrial size was normal in size. Pericardium: There is no evidence of pericardial effusion. Mitral Valve: The mitral valve is degenerative in appearance. Moderate mitral annular calcification. Moderate mitral valve regurgitation. No evidence of mitral valve stenosis. MV peak gradient, 8.2 mmHg. The mean mitral valve gradient is 2.0 mmHg. Tricuspid Valve: The tricuspid valve is normal in structure. Tricuspid valve regurgitation is not demonstrated. No evidence of tricuspid stenosis. Aortic Valve: The aortic valve is normal in structure. Aortic valve regurgitation is trivial. No aortic stenosis is present. Aortic valve mean gradient measures 53.0 mmHg. Aortic valve peak gradient measures 81.7 mmHg. Aortic valve area, by VTI measures 2.17 cm. Pulmonic Valve: The pulmonic valve was normal in structure. Pulmonic valve regurgitation is not visualized. No evidence of pulmonic stenosis. Aorta: The aortic root is normal in size and structure. Venous: The inferior vena cava is dilated in size with greater than 50% respiratory variability, suggesting right atrial pressure of 8 mmHg. IAS/Shunts: No atrial level shunt detected by color flow Doppler.  LEFT VENTRICLE PLAX 2D LVIDd:         3.50 cm      Diastology LVIDs:         2.80 cm      LV e' medial:    4.08 cm/s LV PW:         1.00 cm  LV E/e' medial:  22.3 LV IVS:        1.60 cm      LV e' lateral:   4.40 cm/s LVOT diam:     2.00 cm      LV E/e'  lateral: 20.7 LV SV:         320 LV SV Index:   211          2D Longitudinal Strain LVOT Area:     3.14 cm     2D Strain GLS Avg:     -14.1 %  LV Volumes (MOD) LV vol d, MOD A2C: 133.0 ml LV vol d, MOD A4C: 109.0 ml LV vol s, MOD A2C: 71.7 ml LV vol s, MOD A4C: 50.4 ml LV SV MOD A2C:     61.3 ml LV SV MOD A4C:     109.0 ml LV SV MOD BP:      62.9 ml RIGHT VENTRICLE         IVC TAPSE (M-mode): 2.1 cm  IVC diam: 3.00 cm LEFT ATRIUM             Index        RIGHT ATRIUM           Index LA diam:        3.00 cm 1.97 cm/m   RA Area:     10.60 cm LA Vol (A2C):   64.5 ml 42.41 ml/m  RA Volume:   22.10 ml  14.53 ml/m LA Vol (A4C):   85.3 ml 56.08 ml/m LA Biplane Vol: 76.4 ml 50.23 ml/m  AORTIC VALVE AV Area (Vmax):    1.71 cm AV Area (Vmean):   1.84 cm AV Area (VTI):     2.17 cm AV Vmax:           452.00 cm/s AV Vmean:          344.000 cm/s AV VTI:            1.480 m AV Peak Grad:      81.7 mmHg AV Mean Grad:      53.0 mmHg LVOT Vmax:         246.00 cm/s LVOT Vmean:        201.000 cm/s LVOT VTI:          1.020 m LVOT/AV VTI ratio: 0.69 MITRAL VALVE MV Area (PHT): 1.96 cm     SHUNTS MV Peak grad:  8.2 mmHg     Systemic VTI:  1.02 m MV Mean grad:  2.0 mmHg     Systemic Diam: 2.00 cm MV Vmax:       1.43 m/s MV Vmean:      70.1 cm/s MV Decel Time: 387 msec MR Peak grad: 248.4 mmHg MR Mean grad: 155.0 mmHg MR Vmax:      788.00 cm/s MR Vmean:     569.0 cm/s MV E velocity: 90.90 cm/s MV A velocity: 138.00 cm/s MV E/A ratio:  0.66 Kardie Tobb DO Electronically signed by Thomasene Ripple DO Signature Date/Time: 12/31/2022/11:29:14 AM    Final (Updated)    CT Angio Chest PE W and/or Wo Contrast  Result Date: 12/30/2022 CLINICAL DATA:  Shortness of breath for 2 weeks EXAM: CT ANGIOGRAPHY CHEST WITH CONTRAST TECHNIQUE: Multidetector CT imaging of the chest was performed using the standard protocol during bolus administration of intravenous contrast. Multiplanar CT image reconstructions and MIPs were obtained to evaluate the  vascular anatomy. RADIATION DOSE REDUCTION: This exam was performed according to the  departmental dose-optimization program which includes automated exposure control, adjustment of the mA and/or kV according to patient size and/or use of iterative reconstruction technique. CONTRAST:  75mL OMNIPAQUE IOHEXOL 350 MG/ML SOLN COMPARISON:  None Available. FINDINGS: Cardiovascular: No evidence of pulmonary embolus.Mild cardiomegaly. No pericardial effusion. Normal caliber thoracic aorta with moderate atherosclerotic disease. Mediastinum/Nodes: Small hiatal hernia. Left-sided thyroid nodule measuring 14 mm, no follow-up imaging is necessary. Enlarged lymph nodes seen in the chest. Lungs/Pleura: Central airways are patent. Moderate right and large left pleural effusions with associated atelectasis. Right upper lobe predominant ground-glass opacities and septal thickening. Scattered solid pulmonary nodules. Largest measures 6 mm in the right lower lobe on series 8, image 32. Upper Abdomen: Gallstones. Hyperdense material seen in the gallbladder, likely due to sludge. No acute abnormality. Musculoskeletal: Soft tissue lesion of the medial left chest wall measuring 2.8 x 1.5 cm on series 6, image 38 located between the costochondral cartilage of the left first and second ribs. No aggressive appearing osseous lesions. Review of the MIP images confirms the above findings. IMPRESSION: 1. No evidence of pulmonary embolus. 2. Moderate right and large left pleural effusions with associated atelectasis. 3. Soft tissue lesion of the medial left chest located between the costochondral cartilage of the first and second ribs, concerning for metastatic disease, although benign etiology also possible. Based on goals of care, finding can be further evaluated with soft tissue ultrasound and tissue sampling and/or PET-CT. 4. Right upper lobe predominant ground-glass opacities and septal thickening, likely due to pulmonary edema. 5. Scattered  solid pulmonary nodules measuring up to 6 mm. Non-contrast chest CT at 3-6 months is recommended. If the nodules are stable at time of repeat CT, then future CT at 18-24 months (from today's scan) is considered optional for low-risk patients, but is recommended for high-risk patients. This recommendation follows the consensus statement: Guidelines for Management of Incidental Pulmonary Nodules Detected on CT Images: From the Fleischner Society 2017; Radiology 2017; 284:228-243. 6. Aortic Atherosclerosis (ICD10-I70.0). Electronically Signed   By: Allegra Lai M.D.   On: 12/30/2022 13:40   DG Chest Port 1 View  Result Date: 12/30/2022 CLINICAL DATA:  Shortness of breath for the past 2 weeks. EXAM: PORTABLE CHEST 1 VIEW COMPARISON:  Chest x-ray dated December 24, 2022. FINDINGS: The heart borders are partially obscured but remain mildly enlarged. Interstitial edema in the right lung has improved. Unchanged moderate left and small right pleural effusions with left-greater-than-right basilar atelectasis. No pneumothorax. No acute osseous abnormality. IMPRESSION: 1. Unchanged moderate left and small right pleural effusions with bibasilar atelectasis. Electronically Signed   By: Obie Dredge M.D.   On: 12/30/2022 11:34   DG Chest 2 View  Result Date: 12/29/2022 CLINICAL DATA:  Shortness of breath. EXAM: CHEST - 2 VIEW COMPARISON:  None Available. FINDINGS: There is mild to moderate severity enlargement of the cardiac silhouette. Low lung volumes are seen with moderate severity diffusely increased interstitial lung markings. Mild superimposed right upper lobe and right infrahilar airspace disease is seen. Marked severity consolidation is seen within the left lung base. A moderate to large left pleural effusion is also noted. No pneumothorax is identified. There is anterior dislocation of the left shoulder of indeterminate age. Multilevel degenerative changes are seen throughout the thoracic spine. IMPRESSION: 1.  Cardiomegaly with moderate severity interstitial edema and mild superimposed right upper lobe and right infrahilar airspace disease. 2. Marked severity left basilar consolidation with a moderate to large left pleural effusion. Follow-up to resolution is recommended, as  sequelae associated with an underlying neoplastic process cannot be excluded. 3. Anterior dislocation of the left shoulder of indeterminate age. Electronically Signed   By: Aram Candela M.D.   On: 12/29/2022 03:23   CT ABDOMEN PELVIS W CONTRAST  Result Date: 12/27/2022 CLINICAL DATA:  Gross hematuria, vaginal bleeding, incontinence EXAM: CT ABDOMEN AND PELVIS WITH CONTRAST TECHNIQUE: Multidetector CT imaging of the abdomen and pelvis was performed using the standard protocol following bolus administration of intravenous contrast. RADIATION DOSE REDUCTION: This exam was performed according to the departmental dose-optimization program which includes automated exposure control, adjustment of the mA and/or kV according to patient size and/or use of iterative reconstruction technique. CONTRAST:  OMNIPAQUE IOHEXOL 300 MG/ML  SOLN COMPARISON:  08/02/2021 FINDINGS: Lower chest: Large left, moderate right pleural effusions. Cardiomegaly. Hepatobiliary: No solid liver abnormality is seen. Sludge and small gallstones. No gallbladder wall thickening, or biliary dilatation. Pancreas: Unremarkable. No pancreatic ductal dilatation or surrounding inflammatory changes. Spleen: Normal in size without significant abnormality. Adrenals/Urinary Tract: Adrenal glands are unremarkable. Kidneys are normal, without renal calculi, solid lesion, or hydronephrosis. Severely diverticular urinary bladder, similar to prior examination (series 3, image 67). Stomach/Bowel: Stomach is within normal limits. Appendix not clearly visualized. No evidence of bowel wall thickening, distention, or inflammatory changes. Large burden of stool and stool balls throughout the  colon and rectum. Vascular/Lymphatic: Aortic atherosclerosis. No enlarged abdominal or pelvic lymph nodes. Reproductive: No mass or other significant abnormality. Other: Fat containing umbilical hernia.  No ascites. Musculoskeletal: No acute or significant osseous findings. IMPRESSION: 1. Severely diverticular urinary bladder, similar to prior examination. 2. No evidence of urinary tract calculus or hydronephrosis. 3. Large burden of stool and stool balls throughout the colon and rectum. 4. Large left, moderate right pleural effusions. 5. Cardiomegaly. Aortic Atherosclerosis (ICD10-I70.0). Electronically Signed   By: Jearld Lesch M.D.   On: 12/27/2022 11:45    Microbiology: Results for orders placed or performed during the hospital encounter of 12/27/22  Urine Culture     Status: None   Collection Time: 12/27/22  1:34 PM   Specimen: Urine, Catheterized  Result Value Ref Range Status   Specimen Description   Final    URINE, CATHETERIZED Performed at [facility], [address] [place], [place] [address]    Special Requests   Final    NONE Performed at [facility], [address] [place], [place] [address]    Culture   Final    NO GROWTH Performed at [facility], [address] [place], [place] [address]    Report Status 12/28/2022 FINAL  Final    Labs: CBC: Recent Labs  Lab 12/27/22 0943 12/30/22 1101 12/31/22 0131 01/01/23 0301 01/02/23 0540  WBC 9.7 10.7* 8.9 7.1 9.0  NEUTROABS 8.2* 9.1*  --  5.7 7.2  HGB 11.8* 13.4 11.9* 11.6* 12.4  HCT 37.9 41.9 36.5 36.4 38.6  MCV 92.4 90.3 89.7 89.9 91.5  PLT 367 452* 435* 443* 430*   Basic Metabolic Panel: Recent Labs  Lab 12/30/22 1101 12/30/22 1711 12/30/22 2246 12/31/22 0131 01/01/23 0301 01/02/23 0540  NA 142  --  141 139 139 136  K 3.9  --  3.7 3.5 4.3 4.6  CL 108  --  106 109 106 106  CO2 24  --  21* 22 23 24   GLUCOSE 92  --  100* 100* 112* 96  BUN 29*  --  24* 22 20 20   CREATININE  0.63  --  0.59 0.61 0.57 0.52  CALCIUM 9.5  --  8.7* 8.4* 8.6* 8.6*  MG  --  2.3  --  2.1 2.3 2.3  PHOS  --   --   --  4.9*  --   --    Liver Function Tests: Recent Labs  Lab 12/27/22 0943 12/30/22 2246 12/31/22 0131  AST 24 16 16   ALT 25 17 16   ALKPHOS 53 45 44  BILITOT 0.7 0.6 0.5  PROT 6.2* 5.4* 5.1*  ALBUMIN 3.1* 2.8* 2.7*   CBG: No results for input(s): "GLUCAP" in the last 168 hours.  Discharge time spent: greater than 30 minutes.  Signed: Lurene Shadow, MD [facility] 01/02/2023

## 2023-01-02 NOTE — Progress Notes (Signed)
Orthopedic Tech Progress Note Patient Details:  Tina Webb [facility] 1934/04/12 [id number]  Ortho Devices Type of Ortho Device: Radio broadcast assistant Ortho Device/Splint Location: BLE Ortho Device/Splint Interventions: Ordered, Application   Post Interventions Patient Tolerated: Well  Lovett Calender 01/02/2023, 4:47 PM

## 2023-01-02 NOTE — TOC Initial Note (Signed)
Transition of Care [facility]) - Initial/Assessment Note    Patient Details  Name: Tina Webb MRN: [id number] Date of Birth: 04/07/1934  Transition of Care [facility]) CM/SW Contact:    Gordy Clement, RN Phone Number: 01/02/2023, 2:21 PM  Clinical Narrative:      CM met with Patient at bedside along with Husband. Patient is from the Independent Living at [facility]. Patie t will be returning to [facility] with [facility] (Preferred agency) Liaison has been contacted with referral and they are reaching out to Patient/ Husband.   TOC will continue to follow patient for any additional discharge needs                 Expected Discharge Plan: Home w Hospice Care Barriers to Discharge: Continued Medical Work up   Patient Goals and CMS Choice Patient states their goals for this hospitalization and ongoing recovery are:: go home CMS Medicare.gov Compare Post Acute Care list provided to:: Patient Choice offered to / list presented to : Patient, Spouse      Expected Discharge Plan and Services In-house Referral: Hospice / Palliative Care ([facility]) Discharge Planning Services: CM Consult Post Acute Care Choice: Hospice Living arrangements for the past 2 months: Independent Living Facility ([facility])                 DME Arranged: N/A DME Agency: NA       HH Arranged: RN (Hospice) HH Agency: [facility] Date HH Agency Contacted: 01/02/23 Time HH Agency Contacted: 1420 Representative spoke with at [facility] Agency: Cheri  Prior Living Arrangements/Services Living arrangements for the past 2 months: Independent Living Facility ([facility]) Lives with:: Spouse   Do you feel safe going back to the place where you live?: Yes      Need for Family Participation in Patient Care: Yes (Comment) Care giver support system in place?: Yes (comment) Current home services:  (NONE) Criminal Activity/Legal Involvement Pertinent  to Current Situation/Hospitalization: No - Comment as needed  Activities of Daily Living      Permission Sought/Granted Permission sought to share information with : Case Manager, Magazine features editor Permission granted to share information with : Yes, Verbal Permission Granted  Share Information with NAME: Caliah Kopke at [place]  Permission granted to share info w AGENCY: [facility]  Permission granted to share info w Relationship: CM     Emotional Assessment Appearance:: Appears younger than stated age Attitude/Demeanor/Rapport: Gracious, Engaged Affect (typically observed): Pleasant, Accepting Orientation: : Oriented to Self, Oriented to Place, Oriented to  Time, Oriented to Situation Alcohol / Substance Use: Not Applicable Psych Involvement: No (comment)  Admission diagnosis:  CHF (congestive heart failure) (HCC) [I50.9] Acute pulmonary edema (HCC) [J81.0] MS (multiple sclerosis) (HCC) [G35] Gross hematuria [R31.0] Bilateral pleural effusion [J90] Patient Active Problem List   Diagnosis Date Noted   Acute on chronic diastolic CHF (congestive heart failure) (HCC) 12/30/2022   Soft tissue mass 12/30/2022   Bilateral pleural effusion 12/30/2022   Acute respiratory failure with hypoxia (HCC) 12/30/2022   UTI (urinary tract infection) 12/30/2022   Paroxysmal atrial fibrillation (HCC) 12/30/2022   Leg wound, left 12/30/2022   Constipation 12/30/2022   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   Wheelchair dependence 12/24/2022   Essential tremor 08/23/2021   Primary osteoarthritis of left shoulder 10/05/2020   Chronic left shoulder pain 08/10/2020   Chronic night sweats 08/09/2020   Multiple sclerosis (HCC) 11/02/2019   Gait  disorder 11/02/2019   Weakness 11/02/2019   Urge incontinence of urine 11/02/2019   Foot-drop 10/06/2019   Senile osteoporosis 10/06/2019   Contracted bladder 06/17/2016   Injury of tendon of rotator cuff 04/25/2016   Neurogenic  bladder 04/25/2016   Hypothyroidism 08/16/2010   Hypertrophic cardiomyopathy (HCC) 07/08/2010   Vitamin D deficiency 11/13/2009   Gastroesophageal reflux disease 08/14/2009   PCP:  Nadara Eaton, MD Pharmacy:   [facility] - [place], [place] - [address] [place] [address] Phone: [phone] Fax: [phone]  [facility] [phone] - [place], [place] - [address] AT [facility] [address] [place] [address] Phone: [phone] Fax: [phone]     Social Determinants of Health (SDOH) Social History: SDOH Screenings   Tobacco Use: Low Risk  (01/01/2023)   SDOH Interventions:     Readmission Risk Interventions     No data to display

## 2023-01-02 NOTE — Progress Notes (Signed)
Palliative: Chart review completed.  Tina Webb's goals are set for home with her husband.  They have needed equipment.  She is active with outpatient palliative services with [facility] and per attending family is requesting home with the benefits of hospice care.. Conference with attending, bedside nursing staff, transition of care team related to patient condition, needs, goals of care, disposition.  Plan: Continue to treat the treatable but no CPR or intubation.  Home with spouse has needed equipment.  Active with [facility] outpatient palliative services with transition to "treat the treatable" hospice care.  No charge Lillia Carmel, NP Palliative medicine team Team phone [phone] Greater than 50% of this time was spent counseling and coordinating care related to the above assessment and plan.

## 2023-01-02 NOTE — Progress Notes (Signed)
Physical Therapy Treatment Patient Details Name: Tina Webb MRN: [id number] DOB: 03/21/1934 Today's Date: 01/02/2023   History of Present Illness 89 y.o. female presented to the ED 6/24 with SOB. Pt admitted with acute on chronic CHF. PMHx includes A. Fib., GERD, MS, wheelchair bound, and cutaneous T cell lymphoma.    PT Comments    Pt tolerated today's session well, able to perform seated BLE exercises at EOB. Pt continues to require maxA for bed mobility, able to utilize bed rail for support. Pt required AAROM for RLE exercises, able to complete LLE exercises with AROM. Pt performed seated reaches with RUE and utilized LUE for balance on the bed rail, unable to sit EOB without UE support. Pt noted to have a R lateral lean, improved with use of pillows under the R arm for support. Attempted lateral leans down to pt's elbows and back to neutral but pt requiring assist to achieve midline, reporting fatigue. Discharge recommendations remain appropriate, acute PT will continue to follow up with pt as appropriate.     Recommendations for follow up therapy are one component of a multi-disciplinary discharge planning process, led by the attending physician.  Recommendations may be updated based on patient status, additional functional criteria and insurance authorization.  Follow Up Recommendations       Assistance Recommended at Discharge Frequent or constant Supervision/Assistance  Patient can return home with the following A lot of help with walking and/or transfers;A lot of help with bathing/dressing/bathroom;Assistance with cooking/housework;Assist for transportation   Equipment Recommendations  None recommended by PT    Recommendations for Other Services       Precautions / Restrictions Precautions Precautions: Fall;Other (comment) Precaution Comments: watch BP and O2, B heel wounds Restrictions Weight Bearing Restrictions: No     Mobility  Bed Mobility Overal bed  mobility: Needs Assistance Bed Mobility: Supine to Sit, Sit to Supine     Supine to sit: Max assist, HOB elevated Sit to supine: Max assist, HOB elevated   General bed mobility comments: maxA for BLE management and trunk support for supine<>sit, use of bed rail to assist with trunk. Increased time to pivot hips to the EOB with maxA and use of bed pad    Transfers                   General transfer comment: pt declining any attempts at standing or transferring to chair    Ambulation/Gait               General Gait Details: pt not ambulatory at baseline   Stairs             Wheelchair Mobility    Modified Rankin (Stroke Patients Only)       Balance Overall balance assessment: Needs assistance Sitting-balance support: Single extremity supported, Feet supported, Bilateral upper extremity supported Sitting balance-Leahy Scale: Poor Sitting balance - Comments: pt sitting EOB propped on RUE with R lateral lean, pillows provided and propped under R arm to increase support and decrease lateral lean, intermittent use of bed rail with LUE Postural control: Right lateral lean                                  Cognition Arousal/Alertness: Awake/alert Behavior During Therapy: WFL for tasks assessed/performed Overall Cognitive Status: Within Functional Limits for tasks assessed  General Comments: pleasant throughout session        Exercises General Exercises - Lower Extremity Ankle Circles/Pumps: AROM, Left, AAROM, Right, 10 reps, Seated Long Arc Quad: AROM, Left, AAROM, Right, 10 reps, Seated Hip Flexion/Marching: AROM, Left, AAROM, Right, 10 reps, Seated Other Exercises Other Exercises: Attempted lateral side bends to elbow, requiring assist to return to midline, ~3 reps Other Exercises: Reaches with RUE to varying targets outside of BOS, utilizing LUE on bedrail for balance    General Comments  General comments (skin integrity, edema, etc.): SPO2 stable on room air      Pertinent Vitals/Pain Pain Assessment Pain Assessment: No/denies pain    Home Living                          Prior Function            PT Goals (current goals can now be found in the care plan section) Acute Rehab PT Goals Patient Stated Goal: go home PT Goal Formulation: With patient/family Time For Goal Achievement: 01/14/23 Potential to Achieve Goals: Fair Progress towards PT goals: Progressing toward goals    Frequency    Min 2X/week      PT Plan Current plan remains appropriate    Co-evaluation              AM-PAC PT "6 Clicks" Mobility   Outcome Measure  Help needed turning from your back to your side while in a flat bed without using bedrails?: A Lot Help needed moving from lying on your back to sitting on the side of a flat bed without using bedrails?: A Lot Help needed moving to and from a bed to a chair (including a wheelchair)?: A Lot Help needed standing up from a chair using your arms (e.g., wheelchair or bedside chair)?: Total Help needed to walk in hospital room?: Total Help needed climbing 3-5 steps with a railing? : Total 6 Click Score: 9    End of Session   Activity Tolerance: Patient tolerated treatment well Patient left: in bed;with call bell/phone within reach;with family/visitor present;with bed alarm set Nurse Communication: Mobility status PT Visit Diagnosis: Muscle weakness (generalized) (M62.81);Other abnormalities of gait and mobility (R26.89)     Time: 1191-4782 PT Time Calculation (min) (ACUTE ONLY): 29 min  Charges:  $Therapeutic Exercise: 8-22 mins $Therapeutic Activity: 8-22 mins                     Lindalou Hose, PT DPT Acute Rehabilitation Services Office [phone]    Leonie Man 01/02/2023, 4:21 PM

## 2023-01-02 NOTE — Consult Note (Addendum)
WOC Nurse re-consult Note: Family requested removal of compression wraps and assessment of wounds by WOC team.  Pt is followed as an outpatient by a wound care center and has been using Xerorm gauze and bilat Una boots which are changed weekly.  She has 2 chronic full thickness wounds which are healing; no edema, small amt yellow drainage.   Left calf .8X3X.1cm, red and moist with dry scabbed edges. Right calf 2X1cm with same appearance.   Legs washed and dried and applied Xeroform gauze and foam dressings. Paged ortho tech to re-apply bilat Una boots. Daughter at the bedside to assess wound appearance and discuss plan of care.  Pt should resume follow-up at the outpatient wound center after discharge.  Topical treatment orders provided for bedside nurses to perform as follows: Ortho tech to change bilat Una boots Q THURS.  Bedside nurse, apply Xeroform gauze and foam dressing before new Una boots are applied.  Please re-consult if further assistance is needed.  Thank-you,  Cammie Mcgee MSN, RN, CWOCN, [place], CNS [phone]

## 2023-01-02 NOTE — Care Management Important Message (Signed)
Important Message  Patient Details  Name: Tina Webb MRN: [id number] Date of Birth: [birth year]   Medicare Important Message Given:  Yes     Dorena Bodo 01/02/2023, 2:23 PM

## 2023-01-03 NOTE — TOC CM/SW Note (Signed)
CM received secure chat after hours  on 6/27 that Husband wanted to speak to  me but CM didn't see it until am of 6/28.   Patient had dc'd 6/27 in evening.  CM called husband this am 6/28 and left a voice mail and call back number if he still needed to speak with me.   CM also received a call from Hospice after hours  on 6/27 informing CM that  when liaison went to speak with Patient and Husband about services, Husband was adamant that they did not want Hospice, but wanted Palliative care that their facility [facility]) offers.   CM called head Nurse at [facility] where patient resides (permission given to speak to Haylyn Helme [phone] on 6/27 by patient) .  CM wants to make sure they are receiving the services from [place] and that [place] at the wellness center for independent living is aware and can assist.

## 2023-01-24 ENCOUNTER — Ambulatory Visit (INDEPENDENT_AMBULATORY_CARE_PROVIDER_SITE_OTHER): Payer: Medicare Other | Admitting: Pulmonary Disease

## 2023-01-24 ENCOUNTER — Encounter: Payer: Self-pay | Admitting: Pulmonary Disease

## 2023-01-24 VITALS — BP 100/60 | HR 50 | Ht 62.0 in

## 2023-01-24 DIAGNOSIS — R911 Solitary pulmonary nodule: Secondary | ICD-10-CM | POA: Diagnosis not present

## 2023-01-24 DIAGNOSIS — G35 Multiple sclerosis: Secondary | ICD-10-CM

## 2023-01-24 DIAGNOSIS — M7989 Other specified soft tissue disorders: Secondary | ICD-10-CM | POA: Diagnosis not present

## 2023-01-24 NOTE — Progress Notes (Signed)
Synopsis: Referred in July 2024 for ?lung mass by Tina Dupes, MD  Subjective:   PATIENT ID: Tina Webb [place] GENDER: female DOB: 1934/06/04, MRN: [id number]  Chief Complaint  Patient presents with   Consult    Consult on lung nodule.    89 yo FM, PMH MS, powerwheel chair bound, severe kyphoscoliosis, recent admission for bilateral pleural effusions and decompensated heart failure. She was referred for evaluation after t chest complete that revealed a left anterior chest wall soft tissue mass. She has a few small subcentimeter nodules and has been a life long smoker and no prior cancer history.     Past Medical History:  Diagnosis Date   Anxiety    High cholesterol    Hypertrophic cardiomyopathy (HCC)    Multiple sclerosis (HCC)    Paroxysmal A-fib (HCC)    Rotator cuff tear    left     Family History  Problem Relation Age of Onset   Heart attack Mother    Transient ischemic attack Mother    Arthritis Mother    Heart Problems Father      Past Surgical History:  Procedure Laterality Date   BLADDER SURGERY  2018   STEM placement    IR THORACENTESIS ASP PLEURAL SPACE W/IMG GUIDE  01/01/2023   ULNAR NERVE REPAIR Right 2010    Social History   Socioeconomic History   Marital status: Married    Spouse name: Tina Webb   Number of children: 3   Years of education: [employer] education level: Not on file  Occupational History   Occupation: Retired  Tobacco Use   Smoking status: Never   Smokeless tobacco: Never  Vaping Use   Vaping status: Never Used  Substance and Sexual Activity   Alcohol use: Yes    Comment: 2 drinks per week   Drug use: Never   Sexual activity: Not Currently  Other Topics Concern   Not on file  Social History Narrative   Originally right handed but had surgery and now uses left hand for eating and other activities   Caffeine use: 2 cups coffee per day   Lives with husband   Lives at [place] with husband in [place]    Social Determinants of Health   Financial Resource Strain: Not on file  Food Insecurity: Not on file  Transportation Needs: Not on file  Physical Activity: Not on file  Stress: Not on file  Social Connections: Not on file  Intimate Partner Violence: Not on file     No Known Allergies   Outpatient Medications Prior to Visit  Medication Sig Dispense Refill   amiodarone (PACERONE) 200 MG tablet Take 0.5 tablets (100 mg total) by mouth every other day.     AMPYRA 10 MG TB12 Take 1 tablet (10 mg total) by mouth in the morning and at bedtime.     baclofen (LIORESAL) 10 MG tablet TAKE 1/2 TABLET TWICE DAILY 90 tablet 2   clobetasol cream (TEMOVATE) 0.05 % Apply 1 application  topically daily as needed (Rash).     Cranberry-Vitamin C-Probiotic (AZO CRANBERRY PO) Take 1 tablet by mouth daily.     Desoximetasone (TOPICORT) 0.25 % ointment Apply 1 Application topically 2 (two) times daily.     diclofenac Sodium (VOLTAREN) 1 % GEL Apply 2 g topically as needed (joint pain).     estradiol (ESTRING) 2 MG vaginal ring Place 2 mg vaginally every 3 (three) months. follow package directions     folic  acid (FOLVITE) 1 MG tablet Take 1 mg by mouth daily.     gabapentin (NEURONTIN) 300 MG capsule Take 300-600 mg by mouth See admin instructions. Take 300 mg by mouth in the morning and then take 600 mg by mouth in the evening     hydrocortisone 2.5 % cream Apply 1 application  topically daily as needed (For rash).     levothyroxine (SYNTHROID) 75 MCG tablet Take 75 mcg by mouth daily before breakfast.     liothyronine (CYTOMEL) 5 MCG tablet Take 10 mcg by mouth daily.     methenamine (HIPREX) 1 g tablet Take 1 g by mouth daily.     methotrexate (RHEUMATREX) 2.5 MG tablet Take 15 mg by mouth once a week. Caution:Chemotherapy. Protect from light. 6 tablets weekley     mupirocin cream (BACTROBAN) 2 % Apply 1 application topically as needed.     nitrofurantoin (MACRODANTIN) 50 MG capsule Take 50 mg by mouth  daily.     pantoprazole (PROTONIX) 40 MG tablet Take 40 mg by mouth daily.     polyethylene glycol (MIRALAX / GLYCOLAX) 17 g packet Take 17 g by mouth daily as needed for mild constipation.     Probiotic Product (PROBIOTIC PO) Take 2 tablets by mouth daily.     solifenacin (VESICARE) 5 MG tablet Take 1 tablet by mouth daily.     VITAMIN D PO Take 4,000 Units by mouth daily.     zolpidem (AMBIEN) 10 MG tablet Take 10 mg by mouth at bedtime as needed for sleep.     apixaban (ELIQUIS) 2.5 MG TABS tablet Take 1 tablet (2.5 mg total) by mouth 2 (two) times daily. 60 tablet 0   No facility-administered medications prior to visit.    Review of Systems  Constitutional:  Negative for chills, fever, malaise/fatigue and weight loss.  HENT:  Negative for hearing loss, sore throat and tinnitus.   Eyes:  Negative for blurred vision and double vision.  Respiratory:  Positive for shortness of breath. Negative for cough, hemoptysis, sputum production, wheezing and stridor.   Cardiovascular:  Negative for chest pain, palpitations, orthopnea, leg swelling and PND.  Gastrointestinal:  Negative for abdominal pain, constipation, diarrhea, heartburn, nausea and vomiting.  Genitourinary:  Negative for dysuria, hematuria and urgency.  Musculoskeletal:  Positive for back pain, myalgias and neck pain. Negative for joint pain.  Skin:  Negative for itching and rash.  Neurological:  Negative for dizziness, tingling, weakness and headaches.  Endo/Heme/Allergies:  Negative for environmental allergies. Does not bruise/bleed easily.  Psychiatric/Behavioral:  Negative for depression. The patient is not nervous/anxious and does not have insomnia.   All other systems reviewed and are negative.    Objective:  Physical Exam Vitals reviewed.  Constitutional:      General: She is not in acute distress.    Appearance: She is well-developed.     Comments: Elderly, frail  HENT:     Head: Normocephalic and atraumatic.   Eyes:     General: No scleral icterus.    Conjunctiva/sclera: Conjunctivae normal.     Pupils: Pupils are equal, round, and reactive to light.  Neck:     Vascular: No JVD.     Trachea: No tracheal deviation.  Cardiovascular:     Rate and Rhythm: Normal rate and regular rhythm.     Heart sounds: Normal heart sounds. No murmur heard. Pulmonary:     Effort: Pulmonary effort is normal. No tachypnea, accessory muscle usage or respiratory distress.  Breath sounds: No stridor. No wheezing, rhonchi or rales.  Abdominal:     General: Bowel sounds are normal. There is no distension.     Palpations: Abdomen is soft.     Tenderness: There is no abdominal tenderness.  Musculoskeletal:        General: No tenderness.     Cervical back: Neck supple.  Lymphadenopathy:     Cervical: No cervical adenopathy.  Skin:    General: Skin is warm and dry.     Capillary Refill: Capillary refill takes less than 2 seconds.     Findings: No rash.  Neurological:     Mental Status: She is alert and oriented to person, place, and time.  Psychiatric:        Behavior: Behavior normal.      Vitals:   01/24/23 1429  BP: 100/60  Pulse: (!) 50  SpO2: 96%  Height: 5\' 2"  (1.575 m)   96% on RA BMI Readings from Last 3 Encounters:  01/24/23 19.96 kg/m  01/02/23 19.96 kg/m  12/27/22 21.36 kg/m   Wt Readings from Last 3 Encounters:  01/02/23 109 lb 2 oz (49.5 kg)  12/27/22 116 lb 12.8 oz (53 kg)  12/15/22 120 lb (54.4 kg)     CBC    Component Value Date/Time   WBC 9.0 01/02/2023 0540   RBC 4.22 01/02/2023 0540   HGB 12.4 01/02/2023 0540   HCT 38.6 01/02/2023 0540   PLT 430 (H) 01/02/2023 0540   MCV 91.5 01/02/2023 0540   MCH 29.4 01/02/2023 0540   MCHC 32.1 01/02/2023 0540   RDW 17.9 (H) 01/02/2023 0540   LYMPHSABS 0.7 01/02/2023 0540   MONOABS 0.6 01/02/2023 0540   EOSABS 0.3 01/02/2023 0540   BASOSABS 0.1 01/02/2023 0540     Chest Imaging: CT Chest: BL pleural effusions Left  anterior chest wall mass  The patient's images have been independently reviewed by me.    Pulmonary Functions Testing Results:     No data to display          FeNO:   Pathology:   Echocardiogram:   Heart Catheterization:     Assessment & Plan:     ICD-10-CM   1. Soft tissue mass  M79.89     2. Lung nodule  R91.1     3. MS (multiple sclerosis) (HCC)  G35       Discussion:  89 yo FM, multiple medical problems, frail, severe MS, powerchair bound, severe kyphoscoliosis   Anterior left chest mass, and a few small nodules  P: I think the effusions are from her heart failure and diuresis is what she needs But no fluid studies were sent at the evaluation time  The nodules need no follow up at 89 yo and being a non smoker  The left chest wall mass I dont think needs evaluation either  She does not want any invasive procedures.  I think this is very appropriate If she gets worse I agree with her discharge papers to consider hospice care.    Current Outpatient Medications:    amiodarone (PACERONE) 200 MG tablet, Take 0.5 tablets (100 mg total) by mouth every other day., Disp: , Rfl:    AMPYRA 10 MG TB12, Take 1 tablet (10 mg total) by mouth in the morning and at bedtime., Disp: , Rfl:    baclofen (LIORESAL) 10 MG tablet, TAKE 1/2 TABLET TWICE DAILY, Disp: 90 tablet, Rfl: 2   clobetasol cream (TEMOVATE) 0.05 %, Apply 1 application  topically daily as needed (Rash)., Disp: , Rfl:    Cranberry-Vitamin C-Probiotic (AZO CRANBERRY PO), Take 1 tablet by mouth daily., Disp: , Rfl:    Desoximetasone (TOPICORT) 0.25 % ointment, Apply 1 Application topically 2 (two) times daily., Disp: , Rfl:    diclofenac Sodium (VOLTAREN) 1 % GEL, Apply 2 g topically as needed (joint pain)., Disp: , Rfl:    estradiol (ESTRING) 2 MG vaginal ring, Place 2 mg vaginally every 3 (three) months. follow package directions, Disp: , Rfl:    folic acid (FOLVITE) 1 MG tablet, Take 1 mg by mouth daily., Disp:  , Rfl:    gabapentin (NEURONTIN) 300 MG capsule, Take 300-600 mg by mouth See admin instructions. Take 300 mg by mouth in the morning and then take 600 mg by mouth in the evening, Disp: , Rfl:    hydrocortisone 2.5 % cream, Apply 1 application  topically daily as needed (For rash)., Disp: , Rfl:    levothyroxine (SYNTHROID) 75 MCG tablet, Take 75 mcg by mouth daily before breakfast., Disp: , Rfl:    liothyronine (CYTOMEL) 5 MCG tablet, Take 10 mcg by mouth daily., Disp: , Rfl:    methenamine (HIPREX) 1 g tablet, Take 1 g by mouth daily., Disp: , Rfl:    methotrexate (RHEUMATREX) 2.5 MG tablet, Take 15 mg by mouth once a week. Caution:Chemotherapy. Protect from light. 6 tablets weekley, Disp: , Rfl:    mupirocin cream (BACTROBAN) 2 %, Apply 1 application topically as needed., Disp: , Rfl:    nitrofurantoin (MACRODANTIN) 50 MG capsule, Take 50 mg by mouth daily., Disp: , Rfl:    pantoprazole (PROTONIX) 40 MG tablet, Take 40 mg by mouth daily., Disp: , Rfl:    polyethylene glycol (MIRALAX / GLYCOLAX) 17 g packet, Take 17 g by mouth daily as needed for mild constipation., Disp: , Rfl:    Probiotic Product (PROBIOTIC PO), Take 2 tablets by mouth daily., Disp: , Rfl:    solifenacin (VESICARE) 5 MG tablet, Take 1 tablet by mouth daily., Disp: , Rfl:    VITAMIN D PO, Take 4,000 Units by mouth daily., Disp: , Rfl:    zolpidem (AMBIEN) 10 MG tablet, Take 10 mg by mouth at bedtime as needed for sleep., Disp: , Rfl:    apixaban (ELIQUIS) 2.5 MG TABS tablet, Take 1 tablet (2.5 mg total) by mouth 2 (two) times daily., Disp: 60 tablet, Rfl: 0    Josephine Igo, DO [facility] 01/24/2023 2:54 PM

## 2023-01-24 NOTE — Patient Instructions (Signed)
Thank you for visiting Dr. Icard at [facility]. Today we recommend the following:  Return if symptoms worsen or fail to improve.    Please do your part to reduce the spread of COVID-19.  

## 2023-01-26 ENCOUNTER — Encounter: Payer: Self-pay | Admitting: Cardiology

## 2023-01-27 NOTE — Telephone Encounter (Signed)
From patient.

## 2023-02-11 ENCOUNTER — Telehealth: Payer: Self-pay | Admitting: Neurology

## 2023-02-11 NOTE — Telephone Encounter (Signed)
PMD order re-faxed to [facility]

## 2023-02-11 NOTE — Telephone Encounter (Signed)
PMD order signed, pending Dr.Sater to sign

## 2023-02-11 NOTE — Telephone Encounter (Signed)
Aram Beecham called from [employer] advising the PMD form faxed over from Dr. Epimenio Foot was incomplete yesterday. The forms are being faxed back Attn Victorino Dike to [phone]. PMD Standard Written Order(equipment) all needs to be dated for the same day in order to process with Medicare. It can be faxed Berniece Salines [phone].  If you need to speak with her you can call her at [phone].

## 2023-02-17 NOTE — Telephone Encounter (Signed)
Spoke with Aram Beecham and the dates have been corrected and re-faxed.

## 2023-02-17 NOTE — Telephone Encounter (Signed)
Dates has to be synchronized order, July 17 physical therapy evaluation has to be dated first, PMD standard written order, Practioner standard written order has be dated the same date. Would like a call [phone]

## 2023-02-24 ENCOUNTER — Encounter: Payer: Self-pay | Admitting: Pulmonary Disease

## 2023-03-06 ENCOUNTER — Ambulatory Visit: Payer: Medicare Other | Admitting: Neurology

## 2023-03-11 ENCOUNTER — Ambulatory Visit: Payer: Medicare Other | Admitting: Cardiology

## 2023-03-11 ENCOUNTER — Encounter: Payer: Self-pay | Admitting: Cardiology

## 2023-03-11 VITALS — BP 101/50 | HR 68 | Resp 16 | Ht 62.0 in | Wt 109.0 lb

## 2023-03-11 DIAGNOSIS — I421 Obstructive hypertrophic cardiomyopathy: Secondary | ICD-10-CM

## 2023-03-11 DIAGNOSIS — I480 Paroxysmal atrial fibrillation: Secondary | ICD-10-CM

## 2023-03-11 DIAGNOSIS — I447 Left bundle-branch block, unspecified: Secondary | ICD-10-CM

## 2023-03-11 NOTE — Progress Notes (Signed)
Primary Physician/Referring:  Nadara Eaton, MD  Patient ID: Tina Webb, female    DOB: 05-05-1934, 89 y.o.   MRN: [id number]  No chief complaint on file.  HPI:    Tina Webb  is a 89 y.o.  history of HOCM, atrial fibrillation, LBBB, prolonged QT on EKG, chronic sinus bradycardia and on long-term amiodarone and Inderal LA in view of HOCM to induce bradycardia, esophageal dysphagia, hypothyroid, multiple sclerosis, hyperlipidemia,  syncopal episode, with monitoring found to have persistent atrial fibrillation RVR.  She is presently wheelchair-bound due to multiple sclerosis.  She has been on amiodarone and had cardioversion on 05/08/2022 and has been maintaining sinus rhythm.  Last admission to the hospital with acute decompensated heart failure and hematuria and bilateral pleural effusion and underwent thoracentesis.  Propranolol was discontinued and amiodarone dose reduced to every other day due to bradycardia she now presents for follow-up.  Presently in palliative care.    Patient states that she has recuperated well.  She has not had any further hospitalizations.  She is accompanied by her husband.  She  Past Medical History:  Diagnosis Date  . Anxiety   . High cholesterol   . Hypertrophic cardiomyopathy (HCC)   . Multiple sclerosis (HCC)   . Paroxysmal A-fib (HCC)   . Rotator cuff tear    left   Past Surgical History:  Procedure Laterality Date  . BLADDER SURGERY  2018   STEM placement   . IR THORACENTESIS ASP PLEURAL SPACE W/IMG GUIDE  01/01/2023  . ULNAR NERVE REPAIR Right 2010   Family History  Problem Relation Age of Onset  . Heart attack Mother   . Transient ischemic attack Mother   . Arthritis Mother   . Heart Problems Father     Social History   Tobacco Use  . Smoking status: Never  . Smokeless tobacco: Never  Substance Use Topics  . Alcohol use: Yes    Comment: 2 drinks per week   Marital Status: Married  ROS  Review of Systems   Cardiovascular:  Negative for chest pain, dyspnea on exertion and leg swelling.   Objective      03/11/2023   10:05 AM 01/24/2023    2:29 PM 01/02/2023   11:29 AM  Vitals with BMI  Height 5\' 2"  5\' 2"    Weight 109 lbs --   BMI 19.93    Systolic 101 100 606  Diastolic 50 60 46  Pulse 68 50 49   SpO2: 93 %   Physical Exam Constitutional:      Comments: She is wheelchair-bound, has significant scoliosis.  Neck:     Vascular: No carotid bruit or JVD.  Cardiovascular:     Rate and Rhythm: Normal rate and regular rhythm.     Pulses:          Posterior tibial pulses are 1+ on the right side and 1+ on the left side.     Heart sounds: Murmur heard.     Holosystolic murmur is present at the upper right sternal border and apex.     No gallop.  Pulmonary:     Effort: Pulmonary effort is normal.     Breath sounds: Normal breath sounds.  Abdominal:     General: Bowel sounds are normal.     Palpations: Abdomen is soft.  Musculoskeletal:     Right lower leg: Edema (1-2+ pitting, covered by Ace wrap) present.     Left lower leg: Edema (1-2+ pitting, covered  by Ace wrap) present.   Medications and allergies  No Known Allergies   Medication list   Current Outpatient Medications:  .  amiodarone (PACERONE) 200 MG tablet, Take 0.5 tablets (100 mg total) by mouth every other day., Disp: , Rfl:  .  AMPYRA 10 MG TB12, Take 1 tablet (10 mg total) by mouth in the morning and at bedtime., Disp: , Rfl:  .  apixaban (ELIQUIS) 2.5 MG TABS tablet, Take 1 tablet (2.5 mg total) by mouth 2 (two) times daily., Disp: 60 tablet, Rfl: 0 .  baclofen (LIORESAL) 10 MG tablet, TAKE 1/2 TABLET TWICE DAILY, Disp: 90 tablet, Rfl: 2 .  clobetasol cream (TEMOVATE) 0.05 %, Apply 1 application  topically daily as needed (Rash)., Disp: , Rfl:  .  Cranberry-Vitamin C-Probiotic (AZO CRANBERRY PO), Take 1 tablet by mouth daily., Disp: , Rfl:  .  Desoximetasone (TOPICORT) 0.25 % ointment, Apply 1 Application topically 2  (two) times daily., Disp: , Rfl:  .  diclofenac Sodium (VOLTAREN) 1 % GEL, Apply 2 g topically as needed (joint pain)., Disp: , Rfl:  .  estradiol (ESTRING) 2 MG vaginal ring, Place 2 mg vaginally every 3 (three) months. follow package directions, Disp: , Rfl:  .  folic acid (FOLVITE) 1 MG tablet, Take 1 mg by mouth daily., Disp: , Rfl:  .  gabapentin (NEURONTIN) 300 MG capsule, Take 300-600 mg by mouth See admin instructions. Take 300 mg by mouth in the morning and then take 600 mg by mouth in the evening, Disp: , Rfl:  .  hydrocortisone 2.5 % cream, Apply 1 application  topically daily as needed (For rash)., Disp: , Rfl:  .  levothyroxine (SYNTHROID) 75 MCG tablet, Take 75 mcg by mouth daily before breakfast., Disp: , Rfl:  .  liothyronine (CYTOMEL) 5 MCG tablet, Take 10 mcg by mouth daily., Disp: , Rfl:  .  methenamine (HIPREX) 1 g tablet, Take 1 g by mouth daily., Disp: , Rfl:  .  methotrexate (RHEUMATREX) 2.5 MG tablet, Take 15 mg by mouth once a week. Caution:Chemotherapy. Protect from light. 6 tablets weekley, Disp: , Rfl:  .  mupirocin cream (BACTROBAN) 2 %, Apply 1 application topically as needed., Disp: , Rfl:  .  nitrofurantoin (MACRODANTIN) 50 MG capsule, Take 50 mg by mouth daily., Disp: , Rfl:  .  pantoprazole (PROTONIX) 40 MG tablet, Take 40 mg by mouth daily., Disp: , Rfl:  .  polyethylene glycol (MIRALAX / GLYCOLAX) 17 g packet, Take 17 g by mouth daily as needed for mild constipation., Disp: , Rfl:  .  Probiotic Product (PROBIOTIC PO), Take 2 tablets by mouth daily., Disp: , Rfl:  .  solifenacin (VESICARE) 5 MG tablet, Take 1 tablet by mouth daily., Disp: , Rfl:  .  VITAMIN D PO, Take 4,000 Units by mouth daily., Disp: , Rfl:  .  zolpidem (AMBIEN) 10 MG tablet, Take 10 mg by mouth at bedtime as needed for sleep., Disp: , Rfl:  Laboratory examination:   Recent Labs    12/31/22 0131 01/01/23 0301 01/02/23 0540  NA 139 139 136  K 3.5 4.3 4.6  CL 109 106 106  CO2 22 23 24    GLUCOSE 100* 112* 96  BUN 22 20 20   CREATININE 0.61 0.57 0.52  CALCIUM 8.4* 8.6* 8.6*  GFRNONAA >60 >60 >60    Lab Results  Component Value Date   GLUCOSE 96 01/02/2023   NA 136 01/02/2023   K 4.6 01/02/2023   CL 106  01/02/2023   CO2 24 01/02/2023   BUN 20 01/02/2023   CREATININE 0.52 01/02/2023   GFRNONAA >60 01/02/2023   CALCIUM 8.6 (L) 01/02/2023   PHOS 4.9 (H) 12/31/2022   PROT 5.1 (L) 12/31/2022   ALBUMIN 2.7 (L) 12/31/2022   BILITOT 0.5 12/31/2022   ALKPHOS 44 12/31/2022   AST 16 12/31/2022   ALT 16 12/31/2022   ANIONGAP 6 01/02/2023      Lab Results  Component Value Date   ALT 16 12/31/2022   AST 16 12/31/2022   ALKPHOS 44 12/31/2022   BILITOT 0.5 12/31/2022       Latest Ref Rng & Units 12/31/2022    1:31 AM 12/30/2022   10:46 PM 12/27/2022    9:43 AM  Hepatic Function  Total Protein 6.5 - 8.1 g/dL 5.1  5.4  6.2   Albumin 3.5 - 5.0 g/dL 2.7  2.8  3.1   AST 15 - 41 U/L 16  16  24    ALT 0 - 44 U/L 16  17  25    Alk Phosphatase 38 - 126 U/L 44  45  53   Total Bilirubin 0.3 - 1.2 mg/dL 0.5  0.6  0.7    External labs:   Labs 05/23/2022:  Total cholesterol 174, triglycerides 99, HDL 57, LDL 98.  Non-HDL cholesterol 117.  Serum glucose 86 mg, BUN 41, creatinine 0.72, EGFR 57 mL.  Potassium 4.6.  LFTs normal.  TSH 4.15.  Hb 15.6/HCT 46.6, platelets 209, normal indicis.  Labs 03/07/2022:  Potassium 4.8 3.5 - 5.3 MMOL/L  Chloride 110 98 - 110 MMOL/L  CO2 24 23 - 30 MMOL/L  BUN 27 (H) 8 - 24 MG/DL  Glucose 914 (H) 70 - 99 MG/DL  Creatinine 7.82 9.56 - 1.50 MG/DL  Calcium 8.4 (L) 8.5 - 10.5 MG/DL  Anion Gap 5 4 - 14 MMOL/L  Est. GFR 88 >=60 ML/MIN/1.73 M*2   Magnesium 2.2 1.8 - 2.4 MG/DL   TSH 08/21/863 7.84 - 5.33 UIU/ML 4.02   Radiology:    Cardiac Studies:   Extended outpatient EKG monitoring 14 days starting 05/17/2022: Predominant rhythm is sinus rhythm with IVCD.  Minimum heart rate 50 bpm at 4 AM and maximum of 86 bpm at 6 AM.  There  were 13 brief atrial tachycardia episodes, longest 11 beats. No atrial fibrillation. Occasional PVCs, ventricular couplets and triplets noted, rare. No heart block, 1 patient activated event correlated with PAC.  Echocardiogram 12/31/2022:  1. Know history of HCM, increased resting gradient across the LVOT. Left ventricular ejection fraction, by estimation, is 55 to 60%. The left ventricle has normal function. The left ventricle demonstrates global hypokinesis. There is severe asymmetric  left ventricular hypertrophy of the septal segment. Left ventricular diastolic function could not be evaluated. Elevated left ventricular end-diastolic pressure. The average left ventricular global longitudinal strain is -14.1 %. The global longitudinal  strain is abnormal.  2. Right ventricular systolic function was not well visualized. The right ventricular size is normal.  3. Left atrial size was severely dilated.  4. The mitral valve is degenerative. Moderate mitral valve regurgitation. No evidence of mitral stenosis. Moderate mitral annular calcification.  5. The aortic valve is normal in structure. Aortic valve regurgitation is trivial. No aortic stenosis is present.  6. The inferior vena cava is dilated in size with >50% respiratory variability, suggesting right atrial pressure of 8 mmHg.   EKG:   EKG 03/11/2023: Normal sinus rhythm at rate of 71 bpm, left atrial  enlargement, left bundle branch block.  No further analysis.  Compared to 11/20/2022, heart rate is improved from 48 bpm.  Assessment     ICD-10-CM   1. Paroxysmal atrial fibrillation (HCC)  I48.0 EKG 12-Lead    2. HOCM (hypertrophic obstructive cardiomyopathy) (HCC)  I42.1     3. LBBB (left bundle branch block)  I44.7         Orders Placed This Encounter  Procedures  . EKG 12-Lead    No orders of the defined types were placed in this encounter.   There are no discontinued medications.     Recommendations:   Tina Webb is a 89 y.o. history of HOCM, atrial fibrillation, LBBB, prolonged QT on EKG, chronic sinus bradycardia and on long-term amiodarone and Inderal LA in view of HOCM to induce bradycardia, esophageal dysphagia, hypothyroid, multiple sclerosis, hyperlipidemia,  syncopal episode, with monitoring found to have persistent atrial fibrillation RVR.  She is presently wheelchair-bound due to multiple sclerosis.  1. Paroxysmal atrial fibrillation (HCC) She is maintaining sinus rhythm, hemoglobin has remained stable, previously anticoagulation was discontinued due to UTI and hematuria.  Labs reviewed. - EKG 12-Lead  2. HOCM (hypertrophic obstructive cardiomyopathy) (HCC) Since last hospitalization on 05/08/2022, where she had thoracentesis, she has done extremely well with no recurrence of heart failure.  She is now off of beta-blocker therapy and also amiodarone has been reduced to 100 mg every other day.  She feels well and remains asymptomatic fortunately.  3. LBBB (left bundle branch block) No change in left bundle branch block on EKG.  4. DNR (do not resuscitate) Patient does not want to be full code, she is DNR.  Her husband is present at the bedside.     Patient would like to continue to see me on a 61-month basis hence appointment made.  I am being very conservative regarding her management.    Yates Decamp, MD, [facility] 03/11/2023, 10:16 AM Office: [phone]

## 2023-03-12 ENCOUNTER — Emergency Department (HOSPITAL_COMMUNITY): Payer: Medicare Other

## 2023-03-12 ENCOUNTER — Other Ambulatory Visit: Payer: Self-pay

## 2023-03-12 ENCOUNTER — Observation Stay (HOSPITAL_COMMUNITY)
Admission: EM | Admit: 2023-03-12 | Discharge: 2023-03-13 | Disposition: A | Discharge status: Home / Self Care | Payer: Medicare Other | Attending: Internal Medicine | Admitting: Internal Medicine

## 2023-03-12 DIAGNOSIS — E039 Hypothyroidism, unspecified: Secondary | ICD-10-CM | POA: Diagnosis not present

## 2023-03-12 DIAGNOSIS — I480 Paroxysmal atrial fibrillation: Secondary | ICD-10-CM | POA: Diagnosis not present

## 2023-03-12 DIAGNOSIS — G9340 Encephalopathy, unspecified: Principal | ICD-10-CM | POA: Diagnosis present

## 2023-03-12 DIAGNOSIS — G8929 Other chronic pain: Secondary | ICD-10-CM | POA: Diagnosis not present

## 2023-03-12 DIAGNOSIS — R339 Retention of urine, unspecified: Secondary | ICD-10-CM | POA: Insufficient documentation

## 2023-03-12 DIAGNOSIS — R4182 Altered mental status, unspecified: Secondary | ICD-10-CM | POA: Diagnosis present

## 2023-03-12 DIAGNOSIS — I10 Essential (primary) hypertension: Secondary | ICD-10-CM | POA: Diagnosis not present

## 2023-03-12 LAB — URINALYSIS, W/ REFLEX TO CULTURE (INFECTION SUSPECTED)
Bacteria, UA: NONE SEEN
Bilirubin Urine: NEGATIVE
Glucose, UA: NEGATIVE mg/dL
Ketones, ur: 5 mg/dL — AB
Nitrite: NEGATIVE
Protein, ur: NEGATIVE mg/dL
Specific Gravity, Urine: 1.02 (ref 1.005–1.030)
WBC, UA: >50 WBC/hpf (ref 0–5)
pH: 5 (ref 5.0–8.0)

## 2023-03-12 LAB — CBC WITH DIFFERENTIAL/PLATELET
Abs Immature Granulocytes: 0.02 10*3/uL (ref 0.00–0.07)
Basophils Absolute: 0 10*3/uL (ref 0.0–0.1)
Basophils Relative: 0 %
Eosinophils Absolute: 0.1 10*3/uL (ref 0.0–0.5)
Eosinophils Relative: 1 %
HCT: 45.6 % (ref 36.0–46.0)
Hemoglobin: 14.1 g/dL (ref 12.0–15.0)
Immature Granulocytes: 0 %
Lymphocytes Relative: 8 %
Lymphs Abs: 0.5 10*3/uL — ABNORMAL LOW (ref 0.7–4.0)
MCH: 29.3 pg (ref 26.0–34.0)
MCHC: 30.9 g/dL (ref 30.0–36.0)
MCV: 94.6 fL (ref 80.0–100.0)
Monocytes Absolute: 0.3 10*3/uL (ref 0.1–1.0)
Monocytes Relative: 5 %
Neutro Abs: 5.8 10*3/uL (ref 1.7–7.7)
Neutrophils Relative %: 86 %
Platelets: 295 10*3/uL (ref 150–400)
RBC: 4.82 MIL/uL (ref 3.87–5.11)
RDW: 17 % — ABNORMAL HIGH (ref 11.5–15.5)
WBC: 6.8 10*3/uL (ref 4.0–10.5)
nRBC: 0 % (ref 0.0–0.2)

## 2023-03-12 LAB — COMPREHENSIVE METABOLIC PANEL
ALT: 26 U/L (ref 0–44)
AST: 23 U/L (ref 15–41)
Albumin: 3.6 g/dL (ref 3.5–5.0)
Alkaline Phosphatase: 44 U/L (ref 38–126)
Anion gap: 7 (ref 5–15)
BUN: 41 mg/dL — ABNORMAL HIGH (ref 8–23)
CO2: 24 mmol/L (ref 22–32)
Calcium: 8.7 mg/dL — ABNORMAL LOW (ref 8.9–10.3)
Chloride: 109 mmol/L (ref 98–111)
Creatinine, Ser: 0.57 mg/dL (ref 0.44–1.00)
GFR, Estimated: >60 mL/min (ref >60)
Glucose, Bld: 87 mg/dL (ref 70–99)
Potassium: 4.1 mmol/L (ref 3.5–5.1)
Sodium: 140 mmol/L (ref 135–145)
Total Bilirubin: 0.7 mg/dL (ref 0.3–1.2)
Total Protein: 6.3 g/dL — ABNORMAL LOW (ref 6.5–8.1)

## 2023-03-12 LAB — TSH: TSH: 1.774 u[IU]/mL (ref 0.350–4.500)

## 2023-03-12 MED ORDER — AMIODARONE HCL 100 MG PO TABS
100.0000 mg | ORAL_TABLET | ORAL | Status: DC
  Filled 2023-03-12: qty 1

## 2023-03-12 MED ORDER — GABAPENTIN 100 MG PO CAPS
300.0000 mg | ORAL_CAPSULE | Freq: Once | ORAL | Status: AC
  Administered 2023-03-12: 300 mg via ORAL
  Filled 2023-03-12: qty 3

## 2023-03-12 MED ORDER — LACTATED RINGERS IV SOLN: INTRAVENOUS | Status: DC

## 2023-03-12 MED ORDER — NITROFURANTOIN MACROCRYSTAL 50 MG PO CAPS
50.0000 mg | ORAL_CAPSULE | Freq: Every day | ORAL | Status: DC
  Filled 2023-03-12: qty 1

## 2023-03-12 MED ORDER — SODIUM CHLORIDE 0.9 % IV BOLUS
500.0000 mL | Freq: Once | INTRAVENOUS | Status: AC
  Administered 2023-03-12: 500 mL via INTRAVENOUS

## 2023-03-12 MED ORDER — PANTOPRAZOLE SODIUM 40 MG PO TBEC
40.0000 mg | DELAYED_RELEASE_TABLET | Freq: Every day | ORAL | Status: DC
  Administered 2023-03-12: 40 mg via ORAL
  Filled 2023-03-12: qty 1

## 2023-03-12 MED ORDER — FESOTERODINE FUMARATE ER 4 MG PO TB24
4.0000 mg | ORAL_TABLET | Freq: Every day | ORAL | Status: DC
  Administered 2023-03-12: 4 mg via ORAL
  Filled 2023-03-12 (×2): qty 1

## 2023-03-12 MED ORDER — ACETAMINOPHEN 325 MG PO TABS
650.0000 mg | ORAL_TABLET | Freq: Four times a day (QID) | ORAL | Status: DC | PRN
  Administered 2023-03-13: 650 mg via ORAL
  Filled 2023-03-12: qty 2

## 2023-03-12 MED ORDER — LEVOTHYROXINE SODIUM 50 MCG PO TABS
75.0000 ug | ORAL_TABLET | Freq: Every day | ORAL | Status: DC
  Administered 2023-03-13: 75 ug via ORAL
  Filled 2023-03-12 (×2): qty 1

## 2023-03-12 MED ORDER — METHENAMINE MANDELATE 0.5 G PO TABS
1.0000 g | ORAL_TABLET | Freq: Every day | ORAL | Status: DC
  Filled 2023-03-12: qty 2

## 2023-03-12 MED ORDER — ONDANSETRON HCL 4 MG/2ML IJ SOLN: 4.0000 mg | Freq: Four times a day (QID) | INTRAMUSCULAR | Status: DC | PRN

## 2023-03-12 MED ORDER — ONDANSETRON HCL 4 MG PO TABS: 4.0000 mg | ORAL_TABLET | Freq: Four times a day (QID) | ORAL | Status: DC | PRN

## 2023-03-12 MED ORDER — LIOTHYRONINE SODIUM 5 MCG PO TABS
10.0000 ug | ORAL_TABLET | Freq: Every day | ORAL | Status: DC
  Administered 2023-03-13: 10 ug via ORAL
  Filled 2023-03-12 (×2): qty 2

## 2023-03-12 MED ORDER — APIXABAN 2.5 MG PO TABS
2.5000 mg | ORAL_TABLET | Freq: Two times a day (BID) | ORAL | Status: DC
  Administered 2023-03-12: 2.5 mg via ORAL
  Filled 2023-03-12: qty 1

## 2023-03-12 MED ORDER — OXYCODONE HCL 5 MG PO TABS: 5.0000 mg | ORAL_TABLET | ORAL | Status: DC | PRN

## 2023-03-12 MED ORDER — BACLOFEN 10 MG PO TABS
5.0000 mg | ORAL_TABLET | Freq: Once | ORAL | Status: AC
  Administered 2023-03-12: 5 mg via ORAL
  Filled 2023-03-12: qty 1

## 2023-03-12 MED ORDER — ACETAMINOPHEN 650 MG RE SUPP: 650.0000 mg | Freq: Four times a day (QID) | RECTAL | Status: DC | PRN

## 2023-03-12 NOTE — ED Provider Notes (Signed)
[place] EMERGENCY DEPARTMENT AT [facility] Provider Note   CSN: [id number] Arrival date & time: 03/12/23  1113     History  Chief Complaint  Patient presents with   AMS    Tina Webb is a 89 y.o. female.  HPI Patient brought in with mental status change.  Reportedly within normal yesterday and in fact even went to her cardiologist.  Today much more tired.  Husband states that she just felt tired this morning and then more sleepy when she saw her husband later.  No trauma.  Has chronic wounds on her feet that are reportedly similar to prior.  Has had previous urinary tract infection.  Has had good oral intake overall.  History of MS and is wheelchair-bound at baseline Home Medications Prior to Admission medications   Medication Sig Start Date End Date Taking? Authorizing Provider  amiodarone (PACERONE) 200 MG tablet Take 0.5 tablets (100 mg total) by mouth every other day. 01/02/23  Yes Lurene Shadow, MD  AMPYRA 10 MG TB12 Take 1 tablet (10 mg total) by mouth in the morning and at bedtime. 01/02/23  Yes Lurene Shadow, MD  apixaban (ELIQUIS) 2.5 MG TABS tablet Take 1 tablet (2.5 mg total) by mouth 2 (two) times daily. 02/25/22 03/12/23 Yes Cristopher Peru, PA-C  baclofen (LIORESAL) 10 MG tablet TAKE 1/2 TABLET TWICE DAILY 10/09/22  Yes Sater, Pearletha Furl, MD  Cranberry-Vitamin C-Probiotic (AZO CRANBERRY PO) Take 1 tablet by mouth daily.   Yes [provider]  Desoximetasone (TOPICORT) 0.25 % ointment Apply 1 Application topically 2 (two) times daily. 12/23/22 12/23/23 Yes [provider]  estradiol (ESTRING) 2 MG vaginal ring Place 2 mg vaginally every 3 (three) months. follow package directions   Yes [provider]  folic acid (FOLVITE) 1 MG tablet Take 1 mg by mouth daily.   Yes [provider]  gabapentin (NEURONTIN) 300 MG capsule Take 300-600 mg by mouth 3 (three) times daily.   Yes [provider]  levothyroxine (SYNTHROID)  75 MCG tablet Take 75 mcg by mouth daily before breakfast. 12/23/22  Yes [provider]  liothyronine (CYTOMEL) 5 MCG tablet Take 10 mcg by mouth daily.   Yes [provider]  methenamine (HIPREX) 1 g tablet Take 1 g by mouth daily.   Yes [provider]  methotrexate (RHEUMATREX) 2.5 MG tablet Take 15 mg by mouth once a week. Caution:Chemotherapy. Protect from light. 6 tablets weekley   Yes [provider]  nitrofurantoin (MACRODANTIN) 50 MG capsule Take 50 mg by mouth daily.   Yes [provider]  pantoprazole (PROTONIX) 40 MG tablet Take 40 mg by mouth daily. 04/01/22  Yes [provider]  polyethylene glycol (MIRALAX / GLYCOLAX) 17 g packet Take 17 g by mouth daily as needed for mild constipation.   Yes [provider]  Probiotic Product (PROBIOTIC PO) Take 2 tablets by mouth daily.   Yes [provider]  solifenacin (VESICARE) 5 MG tablet Take 1 tablet by mouth daily. 02/21/22  Yes [provider]  VITAMIN D PO Take 4,000 Units by mouth daily.   Yes [provider]  zolpidem (AMBIEN) 5 MG tablet Take 5 mg by mouth at bedtime as needed for sleep.   Yes [provider]      Allergies    Patient has no known allergies.    Review of Systems   Review of Systems  Physical Exam Updated Vital Signs BP (!) 128/49  Pulse 66   Temp (!) 97.3 F (36.3 C) (Rectal)   Resp (!) 21   SpO2 98%  Physical Exam Vitals reviewed.  HENT:     Head: Atraumatic.  Cardiovascular:     Rate and Rhythm: Regular rhythm.     Heart sounds: Murmur heard.  Abdominal:     Comments: Reducible supraumbilical hernia.  No abdominal mass.  No distention.  Musculoskeletal:     Comments: Chronic wounds of bilateral lower extremities.  Edema bilateral feet.  More erythema on the left which per husband is chronic.  Dressing on left lower extremity.  Skin:    Capillary Refill: Capillary refill takes less than 2 seconds.   Neurological:     Comments: Awake and does answer some questions but sleepy or somnolent.     ED Results / Procedures / Treatments   Labs (all labs ordered are listed, but only abnormal results are displayed) Labs Reviewed  URINALYSIS, W/ REFLEX TO CULTURE (INFECTION SUSPECTED) - Abnormal; Notable for the following components:      Result Value   APPearance CLOUDY (*)    Hgb urine dipstick MODERATE (*)    Ketones, ur 5 (*)    Leukocytes,Ua LARGE (*)    Crystals PRESENT (*)    All other components within normal limits  COMPREHENSIVE METABOLIC PANEL - Abnormal; Notable for the following components:   BUN 41 (*)    Calcium 8.7 (*)    Total Protein 6.3 (*)    All other components within normal limits  CBC WITH DIFFERENTIAL/PLATELET - Abnormal; Notable for the following components:   RDW 17.0 (*)    Lymphs Abs 0.5 (*)    All other components within normal limits  TSH    EKG EKG Interpretation Date/Time:  Wednesday March 12 2023 11:51:10 EDT Ventricular Rate:  70 PR Interval:  171 QRS Duration:  162 QT Interval:  544 QTC Calculation: 588 R Axis:   -31  Text Interpretation: duplicate Confirmed by Benjiman Core [phone]) on 03/12/2023 12:16:58 PM  Radiology DG Chest Portable 1 View  Result Date: 03/12/2023 CLINICAL DATA:  Altered mental status. EXAM: PORTABLE CHEST 1 VIEW COMPARISON:  January 01, 2023. FINDINGS: Stable cardiomediastinal silhouette. Right lung is clear. Moderate size left pleural effusion which is increased compared to prior exam. Left perihilar atelectasis or infiltrate is noted. Severe degenerative changes are seen involving left glenohumeral joint. IMPRESSION: Moderate size left pleural effusion is noted with associated left perihilar atelectasis or infiltrate. Electronically Signed   By: Lupita Raider M.D.   On: 03/12/2023 12:57   CT HEAD WO CONTRAST ( )  Result Date: 03/12/2023 CLINICAL DATA:  Mental status change with unknown cause EXAM: CT HEAD  WITHOUT CONTRAST TECHNIQUE: Contiguous axial images were obtained from the base of the skull through the vertex without intravenous contrast. RADIATION DOSE REDUCTION: This exam was performed according to the departmental dose-optimization program which includes automated exposure control, adjustment of the mA and/or kV according to patient size and/or use of iterative reconstruction technique. COMPARISON:  02/25/2022 FINDINGS: Brain: No evidence of acute infarction, hemorrhage, hydrocephalus, extra-axial collection or mass lesion/mass effect. Unremarkable appearance of the brain for age Vascular: No hyperdense vessel or unexpected calcification. Skull: Normal. Negative for fracture or focal lesion. Sinuses/Orbits: No acute finding. IMPRESSION: Unremarkable head CT for age. Electronically Signed   By: Tiburcio Pea M.D.   On: 03/12/2023 12:57    Procedures Procedures    Medications Ordered in ED Medications  sodium chloride 0.9 %  bolus 500 mL (500 mLs Intravenous New Bag/Given 03/12/23 1428)    ED Course/ Medical Decision Making/ A&P                                 Medical Decision Making Amount and/or Complexity of Data Reviewed Labs: ordered. Radiology: ordered.   Patient with mental status change.  More sedate.  Differential diagnosis along with includes medication effect, infection, intracranial hemorrhage, electrolyte abnormality. Will get head CT.  Will get chest x-ray.  Will get basic blood work including urinalysis.  Will require catheter for urinalysis.  Urinalysis does show white cells but not necessarily infection.  No bacteria.  Mental status improving some but patient's husband who is with her says that she keeps going back to sleep.  BUN mildly elevated.  Potentially has a component of dehydration.  Fluid bolus given.  Will discuss with hospitalist for possible ops admission.        Final Clinical Impression(s) / ED Diagnoses Final diagnoses:  Encephalopathy    Rx  / DC Orders ED Discharge Orders     None         Benjiman Core, MD 03/12/23 1511

## 2023-03-12 NOTE — ED Triage Notes (Signed)
Pt arrives via [facility] from [place] at [facility] for increased sleepiness and decreased alertness. Pt noted to fall asleep while talking but arouses to voice. Per EMS does not appear pt is taking too much medication. Did take half an ambien last night, but is her normal. Pt presents with swollen and discolored lower extremities which is reported as long term and baseline.

## 2023-03-12 NOTE — ED Notes (Signed)
ED TO INPATIENT HANDOFF REPORT  Name/Age/Gender Tina Webb 89 y.o. female  Code Status    Code Status Orders  (From admission, onward)           Start     Ordered   03/12/23 1523  Full code  Continuous       Question:  By:  Answer:  Consent: discussion documented in EHR   03/12/23 1524           Code Status History     Date Active Date Inactive Code Status Order ID Comments User Context   12/30/2022 2224 01/03/2023 0119 DNR [id number]  Therisa Doyne, MD Inpatient       Home/SNF/Other Nursing Home  Chief Complaint Encephalopathy [G93.40]  Level of Care/Admitting Diagnosis ED Disposition     ED Disposition  Admit   Condition  --   Comment  Hospital Area: [facility] [[id number]]  Level of Care: Med-Surg [16]  May place patient in observation at [facility] or [facility] if equivalent level of care is available:: No  Covid Evaluation: Asymptomatic - no recent exposure (last 10 days) testing not required  Diagnosis: Encephalopathy [086578]  Admitting Physician: Alan Mulder [[id number]]  Attending Physician: Alan Mulder [[id number]]          Medical History Past Medical History:  Diagnosis Date   Anxiety    High cholesterol    Hypertrophic cardiomyopathy (HCC)    Multiple sclerosis (HCC)    Paroxysmal A-fib (HCC)    Rotator cuff tear    left    Allergies No Known Allergies  IV Location/Drains/Wounds Patient Lines/Drains/Airways Status     Active Line/Drains/Airways     Name Placement date Placement time Site Days   Peripheral IV 12/30/22 20 G Right Antecubital 12/30/22  1136  Antecubital  72   Wound / Incision (Open or Dehisced) 12/31/22 Other (Comment) Pretibial Left Compression dressing/ Elveria Rising over bilateral lower legs at arrival -wound care RN to set orders 12/31/22  0137  Pretibial  71   Wound / Incision (Open or Dehisced) 12/31/22 Other (Comment) Pretibial Right Compression dressing/ Elveria Rising over  bilateral lower legs at arrival -Wound care RN to set orders 12/31/22  0139  Pretibial  71            Labs/Imaging Results for orders placed or performed during the hospital encounter of 03/12/23 (from the past 48 hour(s))  TSH     Status: None   Collection Time: 03/12/23 11:47 AM  Result Value Ref Range   TSH 1.774 0.350 - 4.500 uIU/mL    Comment: Performed by a 3rd Generation assay with a functional sensitivity of <=0.01 uIU/mL. Performed at [facility], [address], [place], [place] [address]   Comprehensive metabolic panel     Status: Abnormal   Collection Time: 03/12/23 11:47 AM  Result Value Ref Range   Sodium 140 135 - 145 mmol/L   Potassium 4.1 3.5 - 5.1 mmol/L   Chloride 109 98 - 111 mmol/L   CO2 24 22 - 32 mmol/L   Glucose, Bld 87 70 - 99 mg/dL    Comment: Glucose reference range applies only to samples taken after fasting for at least 8 hours.   BUN 41 (H) 8 - 23 mg/dL   Creatinine, Ser 7.25 0.44 - 1.00 mg/dL   Calcium 8.7 (L) 8.9 - 10.3 mg/dL   Total Protein 6.3 (L) 6.5 - 8.1 g/dL   Albumin 3.6 3.5 - 5.0  g/dL   AST 23 15 - 41 U/L   ALT 26 0 - 44 U/L   Alkaline Phosphatase 44 38 - 126 U/L   Total Bilirubin 0.7 0.3 - 1.2 mg/dL   GFR, Estimated >16 >10 mL/min    Comment: (NOTE) Calculated using the CKD-EPI Creatinine Equation (2021)    Anion gap 7 5 - 15    Comment: Performed at [facility], [address], [place], [place] [address]  CBC with Differential     Status: Abnormal   Collection Time: 03/12/23 11:47 AM  Result Value Ref Range   WBC 6.8 4.0 - 10.5 K/uL   RBC 4.82 3.87 - 5.11 MIL/uL   Hemoglobin 14.1 12.0 - 15.0 g/dL   HCT 40.9 81.1 - 91.4 %   MCV 94.6 80.0 - 100.0 fL   MCH 29.3 26.0 - 34.0 pg   MCHC 30.9 30.0 - 36.0 g/dL   RDW 78.2 (H) 95.6 - 21.3 %   Platelets 295 150 - 400 K/uL   nRBC 0.0 0.0 - 0.2 %   Neutrophils Relative % 86 %   Neutro Abs 5.8 1.7 - 7.7 K/uL   Lymphocytes Relative 8 %   Lymphs  Abs 0.5 (L) 0.7 - 4.0 K/uL   Monocytes Relative 5 %   Monocytes Absolute 0.3 0.1 - 1.0 K/uL   Eosinophils Relative 1 %   Eosinophils Absolute 0.1 0.0 - 0.5 K/uL   Basophils Relative 0 %   Basophils Absolute 0.0 0.0 - 0.1 K/uL   Immature Granulocytes 0 %   Abs Immature Granulocytes 0.02 0.00 - 0.07 K/uL    Comment: Performed at [facility], [address] [place], [place] [address]  Urinalysis, w/ Reflex to Culture (Infection Suspected) -Urine, Unspecified Source     Status: Abnormal   Collection Time: 03/12/23 12:56 PM  Result Value Ref Range   Specimen Source URINE, UNSPE    Color, Urine YELLOW YELLOW   APPearance CLOUDY (A) CLEAR   Specific Gravity, Urine 1.020 1.005 - 1.030   pH 5.0 5.0 - 8.0   Glucose, UA NEGATIVE NEGATIVE mg/dL   Hgb urine dipstick MODERATE (A) NEGATIVE   Bilirubin Urine NEGATIVE NEGATIVE   Ketones, ur 5 (A) NEGATIVE mg/dL   Protein, ur NEGATIVE NEGATIVE mg/dL   Nitrite NEGATIVE NEGATIVE   Leukocytes,Ua LARGE (A) NEGATIVE   RBC / HPF 21-50 0 - 5 RBC/hpf   WBC, UA >50 0 - 5 WBC/hpf    Comment:        Reflex urine culture not performed if WBC <=10, OR if Squamous epithelial cells >5. If Squamous epithelial cells >5 suggest recollection.    Bacteria, UA NONE SEEN NONE SEEN   Squamous Epithelial / HPF 0-5 0 - 5 /HPF   WBC Clumps PRESENT    Mucus PRESENT    Budding Yeast PRESENT    Crystals PRESENT (A) NEGATIVE    Comment: Performed at [facility], [address] [place], [place] [address]   DG Chest Portable 1 View  Result Date: 03/12/2023 CLINICAL DATA:  Altered mental status. EXAM: PORTABLE CHEST 1 VIEW COMPARISON:  January 01, 2023. FINDINGS: Stable cardiomediastinal silhouette. Right lung is clear. Moderate size left pleural effusion which is increased compared to prior exam. Left perihilar atelectasis or infiltrate is noted. Severe degenerative changes are seen involving left glenohumeral joint. IMPRESSION:  Moderate size left pleural effusion is noted with associated left perihilar atelectasis or infiltrate. Electronically Signed   By:  Lupita Raider M.D.   On: 03/12/2023 12:57   CT HEAD WO CONTRAST ( )  Result Date: 03/12/2023 CLINICAL DATA:  Mental status change with unknown cause EXAM: CT HEAD WITHOUT CONTRAST TECHNIQUE: Contiguous axial images were obtained from the base of the skull through the vertex without intravenous contrast. RADIATION DOSE REDUCTION: This exam was performed according to the departmental dose-optimization program which includes automated exposure control, adjustment of the mA and/or kV according to patient size and/or use of iterative reconstruction technique. COMPARISON:  02/25/2022 FINDINGS: Brain: No evidence of acute infarction, hemorrhage, hydrocephalus, extra-axial collection or mass lesion/mass effect. Unremarkable appearance of the brain for age Vascular: No hyperdense vessel or unexpected calcification. Skull: Normal. Negative for fracture or focal lesion. Sinuses/Orbits: No acute finding. IMPRESSION: Unremarkable head CT for age. Electronically Signed   By: Tiburcio Pea M.D.   On: 03/12/2023 12:57    Pending Labs Unresulted Labs (From admission, onward)     Start     Ordered   03/13/23 0500  CBC  Tomorrow morning,   R        03/12/23 1524   03/13/23 0500  Comprehensive metabolic panel  Tomorrow morning,   R        03/12/23 1524            Vitals/Pain Today's Vitals   03/12/23 1200 03/12/23 1218 03/12/23 1300 03/12/23 1430  BP: 126/85  (!) 135/55 (!) 128/49  Pulse: 69  63 66  Resp: (!) 23  16 (!) 21  Temp:  (!) 97.3 F (36.3 C)    TempSrc:  Rectal    SpO2: 99%  100% 98%  PainSc:        Isolation Precautions No active isolations  Medications Medications  methenamine (HIPREX) tablet 1 g (has no administration in time range)  nitrofurantoin (MACRODANTIN) capsule 50 mg (has no administration in time range)  amiodarone (PACERONE) tablet 100 mg  (has no administration in time range)  levothyroxine (SYNTHROID) tablet 75 mcg (has no administration in time range)  liothyronine (CYTOMEL) tablet 10 mcg (has no administration in time range)  pantoprazole (PROTONIX) EC tablet 40 mg (has no administration in time range)  fesoterodine (TOVIAZ) tablet 4 mg (has no administration in time range)  apixaban (ELIQUIS) tablet 2.5 mg (has no administration in time range)  acetaminophen (TYLENOL) tablet 650 mg (has no administration in time range)    Or  acetaminophen (TYLENOL) suppository 650 mg (has no administration in time range)  oxyCODONE (Oxy IR/ROXICODONE) immediate release tablet 5 mg (has no administration in time range)  ondansetron (ZOFRAN) tablet 4 mg (has no administration in time range)    Or  ondansetron (ZOFRAN) injection 4 mg (has no administration in time range)  lactated ringers infusion (has no administration in time range)  sodium chloride 0.9 % bolus 500 mL (0 mLs Intravenous Stopped 03/12/23 1531)    Mobility walks with person assist

## 2023-03-12 NOTE — H&P (Signed)
History and Physical    Lota Perlman Cavey AOZ:[id number] DOB: 1933-07-30 DOA: 03/12/2023  PCP: System, Provider Not In   Chief Complaint: ams  HPI: Tina Webb is a 89 y.o. female with medical history significant of MS, A-fib, hypertension, hyperlipidemia who presented to emergency ferment due to fogginess.  Patient intermittently takes Ambien for in some year.  She was given 5 mg Ambien last night before sleep.  This morning her husband states that she was difficult to arouse and was extremely somnolent.  He went to go play golf and that was called that EMS had been called to the house because she was difficult to arouse.  He states that in transport she kept falling asleep and required stimulation to wake up.  On arrival to the ER she was afebrile hemodynamically stable.  Labs were obtained which revealed TSH 1.7, creatinine 0.57, WBC 6.8, hemoglobin 14.1, urinalysis with no bacteria.  Patient underwent chest x-ray which showed left-sided pleural effusion.  CT head showed no acute intracranial abnormality.  Patient was admitted further workup of encephalopathy.  Upon review of patient.  She states that she has never had this before.  She last took Ambien 1 week ago and was previously on as high doses as 10 mg.  She also takes baclofen and gabapentin.  She denies any urinary complaints.  No other infectious symptoms.  Since receiving fluids and presenting to the ER she has returned to her baseline.   Review of Systems: Review of Systems  Constitutional:  Negative for chills and fever.  HENT:  Negative for hearing loss.   Eyes:  Negative for blurred vision.  Respiratory:  Negative for cough.   Cardiovascular:  Negative for chest pain.  Gastrointestinal:  Negative for heartburn and nausea.  Genitourinary:  Negative for dysuria and urgency.  Musculoskeletal:  Negative for myalgias.  Skin:  Negative for rash.  Neurological:  Negative for dizziness and headaches.  Endo/Heme/Allergies:  Negative.   Psychiatric/Behavioral:  Negative for depression.      As per HPI otherwise 10 point review of systems negative.   No Known Allergies  Past Medical History:  Diagnosis Date   Anxiety    High cholesterol    Hypertrophic cardiomyopathy (HCC)    Multiple sclerosis (HCC)    Paroxysmal A-fib (HCC)    Rotator cuff tear    left    Past Surgical History:  Procedure Laterality Date   BLADDER SURGERY  2018   STEM placement    IR THORACENTESIS ASP PLEURAL SPACE W/IMG GUIDE  01/01/2023   ULNAR NERVE REPAIR Right 2010     reports that she has never smoked. She has never used smokeless tobacco. She reports current alcohol use. She reports that she does not use drugs.  Family History  Problem Relation Age of Onset   Heart attack Mother    Transient ischemic attack Mother    Arthritis Mother    Heart Problems Father     Prior to Admission medications   Medication Sig Start Date End Date Taking? Authorizing Provider  amiodarone (PACERONE) 200 MG tablet Take 0.5 tablets (100 mg total) by mouth every other day. 01/02/23  Yes Lurene Shadow, MD  AMPYRA 10 MG TB12 Take 1 tablet (10 mg total) by mouth in the morning and at bedtime. 01/02/23  Yes Lurene Shadow, MD  apixaban (ELIQUIS) 2.5 MG TABS tablet Take 1 tablet (2.5 mg total) by mouth 2 (two) times daily. 02/25/22 03/12/23 Yes Cristopher Peru, PA-C  baclofen (  LIORESAL) 10 MG tablet TAKE 1/2 TABLET TWICE DAILY 10/09/22  Yes Sater, Pearletha Furl, MD  Cranberry-Vitamin C-Probiotic (AZO CRANBERRY PO) Take 1 tablet by mouth daily.   Yes [provider]  Desoximetasone (TOPICORT) 0.25 % ointment Apply 1 Application topically 2 (two) times daily. 12/23/22 12/23/23 Yes [provider]  estradiol (ESTRING) 2 MG vaginal ring Place 2 mg vaginally every 3 (three) months. follow package directions   Yes [provider]  folic acid (FOLVITE) 1 MG tablet Take 1 mg by mouth daily.   Yes [provider]  gabapentin  (NEURONTIN) 300 MG capsule Take 300-600 mg by mouth 3 (three) times daily.   Yes [provider]  levothyroxine (SYNTHROID) 75 MCG tablet Take 75 mcg by mouth daily before breakfast. 12/23/22  Yes [provider]  liothyronine (CYTOMEL) 5 MCG tablet Take 10 mcg by mouth daily.   Yes [provider]  methenamine (HIPREX) 1 g tablet Take 1 g by mouth daily.   Yes [provider]  methotrexate (RHEUMATREX) 2.5 MG tablet Take 15 mg by mouth once a week. Caution:Chemotherapy. Protect from light. 6 tablets weekley   Yes [provider]  nitrofurantoin (MACRODANTIN) 50 MG capsule Take 50 mg by mouth daily.   Yes [provider]  pantoprazole (PROTONIX) 40 MG tablet Take 40 mg by mouth daily. 04/01/22  Yes [provider]  polyethylene glycol (MIRALAX / GLYCOLAX) 17 g packet Take 17 g by mouth daily as needed for mild constipation.   Yes [provider]  Probiotic Product (PROBIOTIC PO) Take 2 tablets by mouth daily.   Yes [provider]  solifenacin (VESICARE) 5 MG tablet Take 1 tablet by mouth daily. 02/21/22  Yes [provider]  VITAMIN D PO Take 4,000 Units by mouth daily.   Yes [provider]  zolpidem (AMBIEN) 5 MG tablet Take 5 mg by mouth at bedtime as needed for sleep.   Yes [provider]    Physical Exam: Vitals:   03/12/23 1200 03/12/23 1218 03/12/23 1300 03/12/23 1430  BP: 126/85  (!) 135/55 (!) 128/49  Pulse: 69  63 66  Resp: (!) 23  16 (!) 21  Temp:  (!) 97.3 F (36.3 C)    TempSrc:  Rectal    SpO2: 99%  100% 98%  Physical Exam Constitutional:      Appearance: She is normal weight.  HENT:     Head: Normocephalic.     Nose: Nose normal.     Mouth/Throat:     Mouth: Mucous membranes are moist.     Pharynx: Oropharynx is clear.  Eyes:     Pupils: Pupils are equal, round, and reactive to light.  Cardiovascular:     Rate and Rhythm: Regular rhythm.     Pulses: Normal  pulses.     Heart sounds: Normal heart sounds.  Pulmonary:     Effort: Pulmonary effort is normal.     Breath sounds: Normal breath sounds.  Abdominal:     General: Abdomen is flat. Bowel sounds are normal.     Palpations: Abdomen is soft.  Musculoskeletal:        General: Normal range of motion.     Cervical back: Normal range of motion.  Skin:    General: Skin is warm.     Capillary Refill: Capillary refill takes less than 2 seconds.  Neurological:     General: No focal deficit present.     Mental Status: She is alert.  Labs on Admission: I have personally reviewed the patients's labs and imaging studies.  Assessment/Plan Principal Problem:   Encephalopathy   # Acute toxic encephalopathy most likely due to Ambien, POA, active - Patient returned to her baseline - Laboratory and imaging workup largely unrevealing - No electrolyte disturbances or ischemic changes on CT -no focal deficits and patient was foggy with no slurred speech  Plan: Hold Ambien Encourage PCP follow-up for possible polypharmacy Monitor for recurrences  # Multiple sclerosis-patient is on methotrexate.  # Paroxysmal A-fib-continue Eliquis, amiodarone  # Hypothyroidism-continue Synthroid, Cytomel  # Chronic urinary retention status post bladder stimulator-continue nitrofurantoin, methenamine  # Chronic pain-continue baclofen, gabapentin  # GERD-continue pantoprazole    Admission status: Observation Med-Surg  Certification: The appropriate patient status for this patient is OBSERVATION. Observation status is judged to be reasonable and necessary in order to provide the required intensity of service to ensure the patient's safety. The patient's presenting symptoms, physical exam findings, and initial radiographic and laboratory data in the context of their medical condition is felt to place them at decreased risk for further clinical deterioration. Furthermore, it is anticipated that the  patient will be medically stable for discharge from the hospital within 2 midnights of admission.     Alan Mulder MD [facility] If 7PM-7AM, please contact night-coverage [email or web address]  03/12/2023, 3:47 PM

## 2023-03-13 DIAGNOSIS — G9340 Encephalopathy, unspecified: Secondary | ICD-10-CM

## 2023-03-13 LAB — CBC
HCT: 39.7 % (ref 36.0–46.0)
Hemoglobin: 12.6 g/dL (ref 12.0–15.0)
MCH: 29.8 pg (ref 26.0–34.0)
MCHC: 31.7 g/dL (ref 30.0–36.0)
MCV: 93.9 fL (ref 80.0–100.0)
Platelets: 274 10*3/uL (ref 150–400)
RBC: 4.23 MIL/uL (ref 3.87–5.11)
RDW: 16.8 % — ABNORMAL HIGH (ref 11.5–15.5)
WBC: 6.1 10*3/uL (ref 4.0–10.5)
nRBC: 0 % (ref 0.0–0.2)

## 2023-03-13 LAB — COMPREHENSIVE METABOLIC PANEL
ALT: 20 U/L (ref 0–44)
AST: 18 U/L (ref 15–41)
Albumin: 2.9 g/dL — ABNORMAL LOW (ref 3.5–5.0)
Alkaline Phosphatase: 38 U/L (ref 38–126)
Anion gap: 6 (ref 5–15)
BUN: 29 mg/dL — ABNORMAL HIGH (ref 8–23)
CO2: 21 mmol/L — ABNORMAL LOW (ref 22–32)
Calcium: 8 mg/dL — ABNORMAL LOW (ref 8.9–10.3)
Chloride: 110 mmol/L (ref 98–111)
Creatinine, Ser: 0.47 mg/dL (ref 0.44–1.00)
GFR, Estimated: >60 mL/min (ref >60)
Glucose, Bld: 91 mg/dL (ref 70–99)
Potassium: 3.7 mmol/L (ref 3.5–5.1)
Sodium: 137 mmol/L (ref 135–145)
Total Bilirubin: 0.6 mg/dL (ref 0.3–1.2)
Total Protein: 5.3 g/dL — ABNORMAL LOW (ref 6.5–8.1)

## 2023-03-13 NOTE — Discharge Summary (Signed)
Physician Discharge Summary  Tina Webb YQM:[id number] DOB: [birth year] DOA: 03/12/2023  PCP: System, Provider Not In  Admit date: 03/12/2023 Discharge date: 03/13/2023 Recommendations for Outpatient Follow-up:  Follow up with PCP in 1 weeks-call for appointment Please obtain BMP/CBC in one week  Discharge Dispo: home Discharge Condition: Stable Code Status:   Code Status: Full Code Diet recommendation:  Diet Order             Diet regular Room service appropriate? Yes; Fluid consistency: Thin  Diet effective now                   Brief/Interim Summary:  89 y.o. female with medical history significant of MS, A-fib, hypertension, hyperlipidemia presented with excessive somnolence and fogginess.  She was given Ambien the night before.  Patient also takes baclofen and gabapentin.  She denies urinary complaints.In the ED afebrile BP stable not hypoxic.  Labs unremarkable CMP CBC, UA with WBC more than 50 leukocytes large nitrate negativeCT head unremarkable. Patient at this time feels completely normal and requested for early discharge today.   Discharge Diagnoses:  Principal Problem:   Encephalopathy  Acute toxic encephalopathy Suspected polypharmacy: Sleepiness somnolence in the setting of polypharmacy with Ambien, also takes gabapentin and baclofen at baseline.  In the ED return to baseline CT scan unremarkable UA abnormal but no urinary symptoms nonfocal on exam.  Ambien held on admission.  She remains appropriate alert and oriented and medically stable. Husband is agreeable for discharge.  I called and discussed with him.  Chronic pain on baclofen and gabapentin,patient to discuss with PCP to wean off. Multiple sclerosis on methotrexate. PAF on Eliquis and amiodarone, controlled. Hypothyroidism on Synthroid and Cytomel, continue same and follow-up with PCP Chronic urine retention status post bladder stimulator on nitrofurantoin and methenamine chronically-morning she denies  any current dizziness symptoms. GERD on PPI  Consults: none Subjective: Alert awake oriented, reports she has an appointment that she has been waiting for past 8 months for wheelchair measurement and requesting for discharge  Discharge Exam: Vitals:   03/12/23 2156 03/13/23 0120  BP: (!) 122/58 121/64  Pulse: 63 70  Resp: 18 18  Temp: (!) 97.5 F (36.4 C) 97.7 F (36.5 C)  SpO2: 98% 95%   General: Pt is alert, awake, not in acute distress Cardiovascular: RRR, S1/S2 +, no rubs, no gallops Respiratory: CTA bilaterally, no wheezing, no rhonchi Abdominal: Soft, NT, ND, bowel sounds + Extremities: no edema, no cyanosis  Discharge Instructions  Discharge Instructions     Discharge instructions   Complete by: As directed    Please call call MD or return to ER for similar or worsening recurring problem that brought you to hospital or if any fever,nausea/vomiting,abdominal pain, uncontrolled pain, chest pain,  shortness of breath or any other alarming symptoms.  Please follow-up your doctor as instructed in a week time and call the office for appointment.  Please avoid alcohol, smoking, or any other illicit substance and maintain healthy habits including taking your regular medications as prescribed.  You were cared for by a hospitalist during your hospital stay. If you have any questions about your discharge medications or the care you received while you were in the hospital after you are discharged, you can call the unit and ask to speak with the hospitalist on call if the hospitalist that took care of you is not available.  Once you are discharged, your primary care physician will handle any further medical issues. Please note that  NO REFILLS for any discharge medications will be authorized once you are discharged, as it is imperative that you return to your primary care physician (or establish a relationship with a primary care physician if you do not have one) for your aftercare  needs so that they can reassess your need for medications and monitor your lab values   Increase activity slowly   Complete by: As directed       Allergies as of 03/13/2023   No Known Allergies      Medication List     STOP taking these medications    zolpidem 5 MG tablet Commonly known as: AMBIEN       TAKE these medications    amiodarone 200 MG tablet Commonly known as: PACERONE Take 0.5 tablets (100 mg total) by mouth every other day.   Ampyra 10 MG Tb12 Generic drug: dalfampridine Take 1 tablet (10 mg total) by mouth in the morning and at bedtime.   apixaban 2.5 MG Tabs tablet Commonly known as: ELIQUIS Take 1 tablet (2.5 mg total) by mouth 2 (two) times daily.   AZO CRANBERRY PO Take 1 tablet by mouth daily.   baclofen 10 MG tablet Commonly known as: LIORESAL TAKE 1/2 TABLET TWICE DAILY   Desoximetasone 0.25 % ointment Commonly known as: TOPICORT Apply 1 Application topically 2 (two) times daily.   Estring 2 MG vaginal ring Generic drug: estradiol Place 2 mg vaginally every 3 (three) months. follow package directions   folic acid 1 MG tablet Commonly known as: FOLVITE Take 1 mg by mouth daily.   gabapentin 300 MG capsule Commonly known as: NEURONTIN Take 300-600 mg by mouth 3 (three) times daily.   levothyroxine 75 MCG tablet Commonly known as: SYNTHROID Take 75 mcg by mouth daily before breakfast.   liothyronine 5 MCG tablet Commonly known as: CYTOMEL Take 10 mcg by mouth daily.   methenamine 1 g tablet Commonly known as: HIPREX Take 1 g by mouth daily.   methotrexate 2.5 MG tablet Commonly known as: RHEUMATREX Take 15 mg by mouth once a week. Caution:Chemotherapy. Protect from light. 6 tablets weekley   nitrofurantoin 50 MG capsule Commonly known as: MACRODANTIN Take 50 mg by mouth daily.   pantoprazole 40 MG tablet Commonly known as: PROTONIX Take 40 mg by mouth daily.   polyethylene glycol 17 g packet Commonly known as:  MIRALAX / GLYCOLAX Take 17 g by mouth daily as needed for mild constipation.   PROBIOTIC PO Take 2 tablets by mouth daily.   solifenacin 5 MG tablet Commonly known as: VESICARE Take 1 tablet by mouth daily.   VITAMIN D PO Take 4,000 Units by mouth daily.        No Known Allergies  The results of significant diagnostics from this hospitalization (including imaging, microbiology, ancillary and laboratory) are listed below for reference.    Microbiology: No results found for this or any previous visit (from the past 240 hour(s)).  Procedures/Studies: DG Chest Portable 1 View  Result Date: 03/12/2023 CLINICAL DATA:  Altered mental status. EXAM: PORTABLE CHEST 1 VIEW COMPARISON:  January 01, 2023. FINDINGS: Stable cardiomediastinal silhouette. Right lung is clear. Moderate size left pleural effusion which is increased compared to prior exam. Left perihilar atelectasis or infiltrate is noted. Severe degenerative changes are seen involving left glenohumeral joint. IMPRESSION: Moderate size left pleural effusion is noted with associated left perihilar atelectasis or infiltrate. Electronically Signed   By: Lupita Raider M.D.   On: 03/12/2023 12:57  CT HEAD WO CONTRAST ( )  Result Date: 03/12/2023 CLINICAL DATA:  Mental status change with unknown cause EXAM: CT HEAD WITHOUT CONTRAST TECHNIQUE: Contiguous axial images were obtained from the base of the skull through the vertex without intravenous contrast. RADIATION DOSE REDUCTION: This exam was performed according to the departmental dose-optimization program which includes automated exposure control, adjustment of the mA and/or kV according to patient size and/or use of iterative reconstruction technique. COMPARISON:  02/25/2022 FINDINGS: Brain: No evidence of acute infarction, hemorrhage, hydrocephalus, extra-axial collection or mass lesion/mass effect. Unremarkable appearance of the brain for age Vascular: No hyperdense vessel or unexpected  calcification. Skull: Normal. Negative for fracture or focal lesion. Sinuses/Orbits: No acute finding. IMPRESSION: Unremarkable head CT for age. Electronically Signed   By: Tiburcio Pea M.D.   On: 03/12/2023 12:57    Labs: BNP (last 3 results) Recent Labs    12/30/22 1101 01/01/23 0301 01/02/23 0540  BNP 1,387.2* 2,499.3* 2,550.8*   Basic Metabolic Panel: Recent Labs  Lab 03/12/23 1147 03/13/23 0435  NA 140 137  K 4.1 3.7  CL 109 110  CO2 24 21*  GLUCOSE 87 91  BUN 41* 29*  CREATININE 0.57 0.47  CALCIUM 8.7* 8.0*   Liver Function Tests: Recent Labs  Lab 03/12/23 1147 03/13/23 0435  AST 23 18  ALT 26 20  ALKPHOS 44 38  BILITOT 0.7 0.6  PROT 6.3* 5.3*  ALBUMIN 3.6 2.9*   No results for input(s): "LIPASE", "AMYLASE" in the last 168 hours. No results for input(s): "AMMONIA" in the last 168 hours. CBC: Recent Labs  Lab 03/12/23 1147 03/13/23 0435  WBC 6.8 6.1  NEUTROABS 5.8  --   HGB 14.1 12.6  HCT 45.6 39.7  MCV 94.6 93.9  PLT 295 274   Cardiac Enzymes: No results for input(s): "CKTOTAL", "CKMB", "CKMBINDEX", "TROPONINI" in the last 168 hours. BNP: Invalid input(s): "POCBNP" CBG: No results for input(s): "GLUCAP" in the last 168 hours. D-Dimer No results for input(s): "DDIMER" in the last 72 hours. Hgb A1c No results for input(s): "HGBA1C" in the last 72 hours. Lipid Profile No results for input(s): "CHOL", "HDL", "LDLCALC", "TRIG", "CHOLHDL", "LDLDIRECT" in the last 72 hours. Thyroid function studies Recent Labs    03/12/23 1147  TSH 1.774   Anemia work up No results for input(s): "VITAMINB12", "FOLATE", "FERRITIN", "TIBC", "IRON", "RETICCTPCT" in the last 72 hours. Urinalysis    Component Value Date/Time   COLORURINE YELLOW 03/12/2023 1256   APPEARANCEUR CLOUDY (A) 03/12/2023 1256   LABSPEC 1.020 03/12/2023 1256   PHURINE 5.0 03/12/2023 1256   GLUCOSEU NEGATIVE 03/12/2023 1256   HGBUR MODERATE (A) 03/12/2023 1256   BILIRUBINUR NEGATIVE  03/12/2023 1256   KETONESUR 5 (A) 03/12/2023 1256   PROTEINUR NEGATIVE 03/12/2023 1256   NITRITE NEGATIVE 03/12/2023 1256   LEUKOCYTESUR LARGE (A) 03/12/2023 1256   Sepsis Labs Recent Labs  Lab 03/12/23 1147 03/13/23 0435  WBC 6.8 6.1   Microbiology No results found for this or any previous visit (from the past 240 hour(s)).   Time coordinating discharge: 25 minutes  SIGNED: Lanae Boast, MD  [facility] 03/13/2023, 9:39 AM  If 7PM-7AM, please contact night-coverage [email or web address]

## 2023-03-13 NOTE — Hospital Course (Addendum)
89 y.o. female with medical history significant of MS, A-fib, hypertension, hyperlipidemia presented with excessive somnolence and fogginess.  She was given Ambien the night before.  Patient also takes baclofen and gabapentin.  She denies urinary complaints.In the ED afebrile BP stable not hypoxic.  Labs unremarkable CMP CBC, UA with WBC more than 50 leukocytes large nitrate negativeCT head unremarkable. Patient at this time feels completely normal and requested for early discharge today.

## 2023-03-13 NOTE — Progress Notes (Signed)
PT Cancellation Note  Patient Details Name: Tina Webb MRN: [id number] DOB: 08-17-1933   Cancelled Treatment:    Reason Eval/Treat Not Completed: PT screened, no needs identified, will sign off PT orders received, chart reviewed. Pt received in bed stating "I don't need you, I just got here last night & nothing's changed, I'm being discharged". Pt reports she is able to transfer to motorized w/c at baseline & receives HHPT 3x/week. Pt declines PT evaluation. PT to complete current orders at this time.   Aleda Grana, PT, DPT 03/13/23, 9:54 AM   Sandi Mariscal 03/13/2023, 9:51 AM

## 2023-03-13 NOTE — Progress Notes (Signed)
OT Cancellation Note  Patient Details Name: Tina Webb MRN: [id number] DOB: 04-23-1934   Cancelled Treatment:     OT screen completed and pt determined to be without therapy needs. OT spoke with PT who stated pt declines the need for therapy services stating, "I don't need you, I just got here last night & nothing's changed, I'm being discharged". Pt reported she is able to transfer to motorized w/c at baseline & receives [facility] therapy 3x/week. Pt declines therapy evaluation.    Reuben Likes, OTR/L 03/13/2023, 2:32 PM

## 2023-03-13 NOTE — Consult Note (Signed)
WOC Nurse Consult Note: Reason for Consult: Consult requested for bilat leg wounds.  Pt is familiar to the WOC team from previous admission in June.  She is followed by the outpatient wound care center for chronic full thickness wounds and has worn Una boots in the past.  Attempted to perform an assessment, but patient states she is discharging this morning and has an appointment at the wound care center tomorrow and denies need for a wound consult.  Topical treatment orders provided for bedside nurses:  Pt requests that bilat leg dressings remain in place until she goes to her appointment at the outpatient wound clinic tomorrow.  Please re-consult if further assistance is needed.  Thank-you,  Cammie Mcgee MSN, RN, CWOCN, [place], CNS [phone]

## 2023-03-15 ENCOUNTER — Encounter: Payer: Self-pay | Admitting: Neurology

## 2023-03-19 ENCOUNTER — Encounter: Payer: Self-pay | Admitting: Neurology

## 2023-04-02 ENCOUNTER — Ambulatory Visit: Payer: Medicare Other | Admitting: Orthopedic Surgery

## 2023-04-02 ENCOUNTER — Other Ambulatory Visit: Payer: Self-pay

## 2023-04-02 DIAGNOSIS — M19012 Primary osteoarthritis, left shoulder: Secondary | ICD-10-CM | POA: Diagnosis not present

## 2023-04-03 ENCOUNTER — Encounter: Payer: Self-pay | Admitting: Orthopedic Surgery

## 2023-04-03 MED ORDER — LIDOCAINE HCL 1 % IJ SOLN
5.0000 mL | INTRAMUSCULAR | Status: AC | PRN
Start: 2023-04-02 — End: 2023-04-02
  Administered 2023-04-02: 5 mL

## 2023-04-03 MED ORDER — BUPIVACAINE HCL 0.5 % IJ SOLN
9.0000 mL | INTRAMUSCULAR | Status: AC | PRN
Start: 2023-04-02 — End: 2023-04-02
  Administered 2023-04-02: 9 mL via INTRA_ARTICULAR

## 2023-04-03 MED ORDER — METHYLPREDNISOLONE ACETATE 40 MG/ML IJ SUSP
40.0000 mg | INTRAMUSCULAR | Status: AC | PRN
Start: 2023-04-02 — End: 2023-04-02
  Administered 2023-04-02: 40 mg via INTRA_ARTICULAR

## 2023-04-03 NOTE — Progress Notes (Signed)
Office Visit Note   Patient: Tina Webb           Date of Birth: 08-06-1933           MRN: [id number] Visit Date: 04/02/2023 Requested by: Nadara Eaton, MD [address] [place],  [place] [address] PCP: System, Provider Not In  Subjective: Chief Complaint  Patient presents with   Left Shoulder - Pain    HPI: Tina Webb is a 89 y.o. female who presents to the office reporting left shoulder and forearm pain.  Last injection was 03/07/2022 in the glenohumeral joint.  She has done well until recently.  She is reporting a little bit of left forearm pain which is atraumatic in onset.  She has significant erosion and anterior displacement of the humeral head relative to the eroded glenoid..                ROS: All systems reviewed are negative as they relate to the chief complaint within the history of present illness.  Patient denies fevers or chills.  Assessment & Plan: Visit Diagnoses:  1. Arthritis of left shoulder region     Plan: Impression is left shoulder pain.  Glenohumeral joint injection performed today.  I think she may be getting a little brachial plexus pain from her shoulder issues.  She will follow-up as needed.  Follow-Up Instructions: No follow-ups on file.   Orders:  Orders Placed This Encounter  Procedures   US Guided Needle Placement - No Linked Charges   No orders of the defined types were placed in this encounter.     Procedures: Large Joint Inj: L glenohumeral on 04/02/2023 6:19 PM Indications: diagnostic evaluation and pain Details: 22 G 1.5 in needle, ultrasound-guided posterior approach  Arthrogram: No  Medications: 9 mL bupivacaine 0.5 %; 40 mg methylPREDNISolone acetate 40 MG/ML; 5 mL lidocaine 1 % Outcome: tolerated well, no immediate complications Procedure, treatment alternatives, risks and benefits explained, specific risks discussed. Consent was given by the patient. Immediately prior to procedure a time out was called to  verify the correct patient, procedure, equipment, support staff and site/side marked as required. Patient was prepped and draped in the usual sterile fashion.       Clinical Data: No additional findings.  Objective: Vital Signs: There were no vitals taken for this visit.  Physical Exam:  Constitutional: Patient appears well-developed HEENT:  Head: Normocephalic Eyes:EOM are normal Neck: Normal range of motion Cardiovascular: Normal rate Pulmonary/chest: Effort normal Neurologic: Patient is alert Skin: Skin is warm Psychiatric: Patient has normal mood and affect  Ortho Exam: Ortho exam demonstrates functional deltoid but anterior subluxation of the numeral head on the glenoid.  Deltoid does fire.  She has pretty reasonable grip strength as well as functional biceps and tricep strength.  Radial pulses also intact.  Predictably limited active range of motion but she does have about a 30 degree arc of motion in most planes.  Specialty Comments:  No specialty comments available.  Imaging: No results found.   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  Wheelchair dependence 12/24/2022   Essential tremor 08/23/2021   Primary osteoarthritis of left shoulder 10/05/2020   Chronic left shoulder pain 08/10/2020   Chronic night sweats 08/09/2020   Multiple sclerosis (HCC) 11/02/2019   Gait disorder 11/02/2019   Weakness 11/02/2019   Urge incontinence of urine 11/02/2019   Foot-drop 10/06/2019   Senile osteoporosis 10/06/2019   Contracted  bladder 06/17/2016   Injury of tendon of rotator cuff 04/25/2016   Neurogenic bladder 04/25/2016   Hypothyroidism 08/16/2010   Hypertrophic cardiomyopathy (HCC) 07/08/2010   Vitamin D deficiency 11/13/2009   Gastroesophageal reflux disease 08/14/2009   Past Medical History:  Diagnosis Date   Anxiety    High cholesterol    Hypertrophic cardiomyopathy (HCC)    Multiple sclerosis (HCC)    Paroxysmal A-fib (HCC)    Rotator cuff tear    left    Family History  Problem Relation Age of Onset   Heart attack Mother    Transient ischemic attack Mother    Arthritis Mother    Heart Problems Father     Past Surgical History:  Procedure Laterality Date   BLADDER SURGERY  2018   STEM placement    IR THORACENTESIS ASP PLEURAL SPACE W/IMG GUIDE  01/01/2023   ULNAR NERVE REPAIR Right 2010   Social History   Occupational History   Occupation: Retired  Tobacco Use   Smoking status: Never   Smokeless tobacco: Never  Vaping Use   Vaping status: Never Used  Substance and Sexual Activity   Alcohol use: Yes    Comment: 2 drinks per week   Drug use: Never   Sexual activity: Not Currently

## 2023-04-25 ENCOUNTER — Other Ambulatory Visit: Payer: Self-pay

## 2023-04-25 ENCOUNTER — Telehealth: Payer: Self-pay | Admitting: Cardiology

## 2023-04-25 MED ORDER — APIXABAN 2.5 MG PO TABS: 2.5000 mg | ORAL_TABLET | Freq: Two times a day (BID) | ORAL | 1 refills | Status: DC

## 2023-04-25 NOTE — Telephone Encounter (Signed)
Prescription refill request for Eliquis received. Indication:afib Last office visit:9/24 Scr:0.47  9/24 Age: 89 Weight:49.4  kg  Prescription refilled

## 2023-04-25 NOTE — Telephone Encounter (Signed)
*  STAT* If patient is at the pharmacy, call can be transferred to refill team.   1. Which medications need to be refilled? (please list name of each medication and dose if known)   apixaban (ELIQUIS) 2.5 MG TABS tablet (Expired)    2. Which pharmacy/location (including street and city if local pharmacy) is medication to be sent to?[facility] - [place], [place] - [address]   3. Do they need a 30 day or 90 day supply? 90 day

## 2023-05-01 NOTE — Telephone Encounter (Signed)
Received notification that pt did not read mychart. Called pt [phone]. I relayed mychart message. She verbalized understanding and appreciation.

## 2023-05-24 IMAGING — CT CT CERVICAL SPINE W/O CM
2 series · 12 of 27 positions shown, 15 images · non-contrast
Comparison: None.

CLINICAL DATA: 87-year-old female with left side radicular pain.
Left arm pain and numbness.



[Series 3: c spine soft · axial · 0.33mm/px · z∈[-164,-58]mm · 7 of 63 slices shown, 9 images]
[im 5/63  soft-tissue]
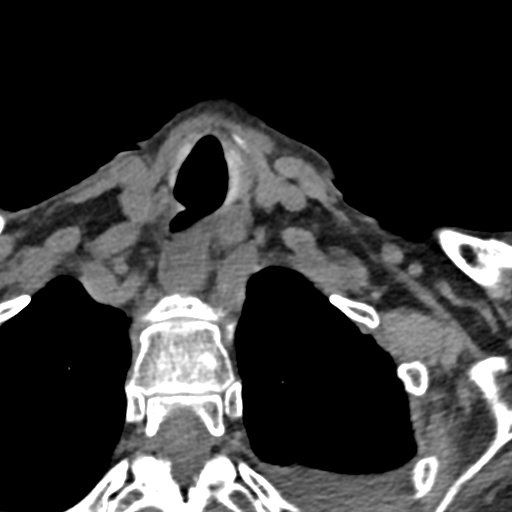
[im 5/63  bone]
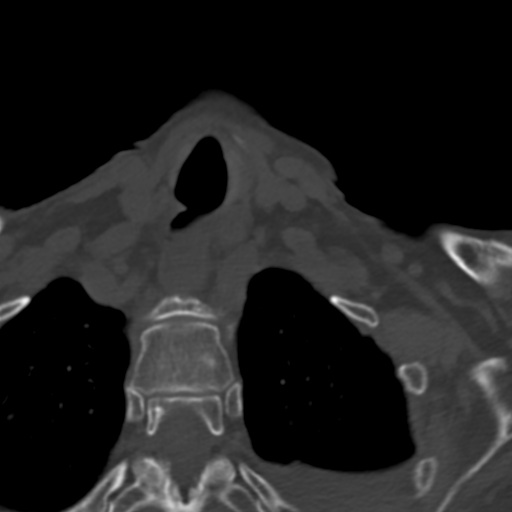
[im 15/63  bone]
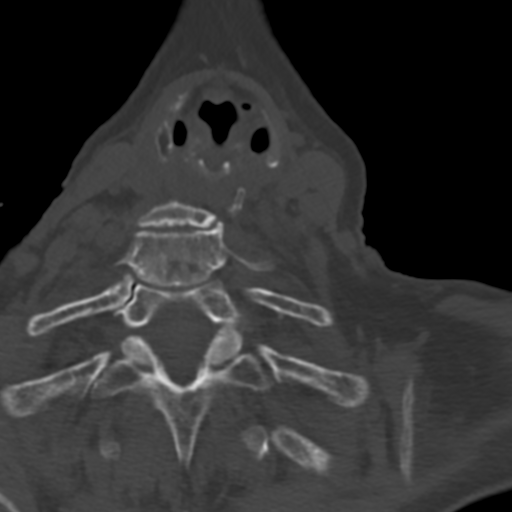
[im 24/63  bone]
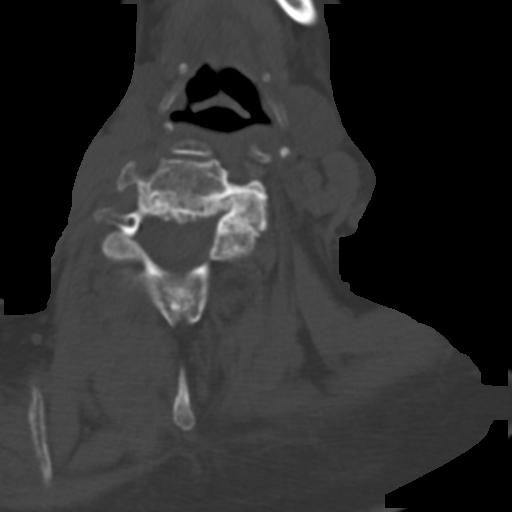
[im 34/63  bone]
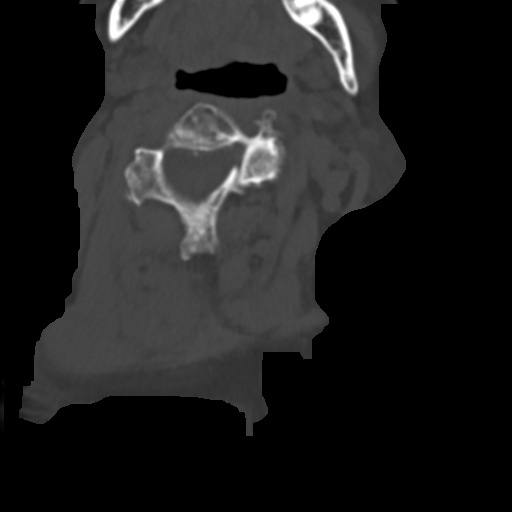
[im 39/63  soft-tissue]
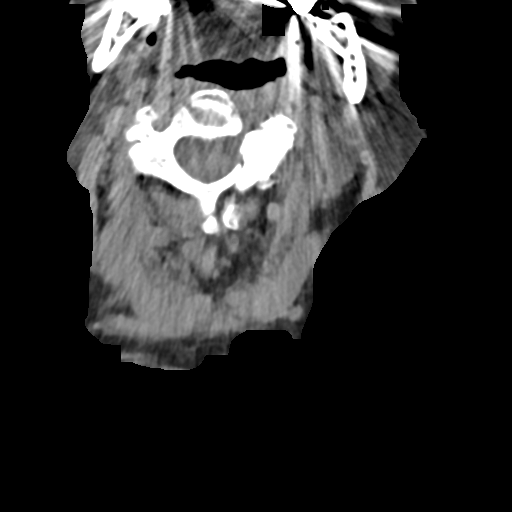
[im 39/63  bone]
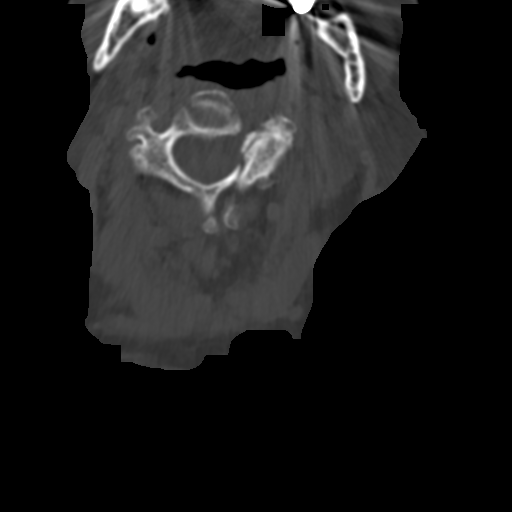
[im 48/63  bone]
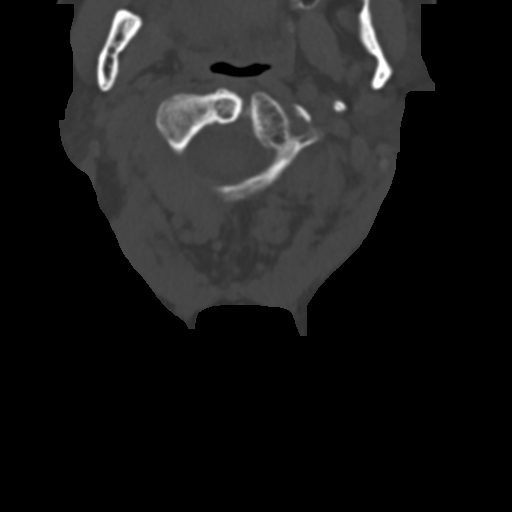
[im 58/63  bone]
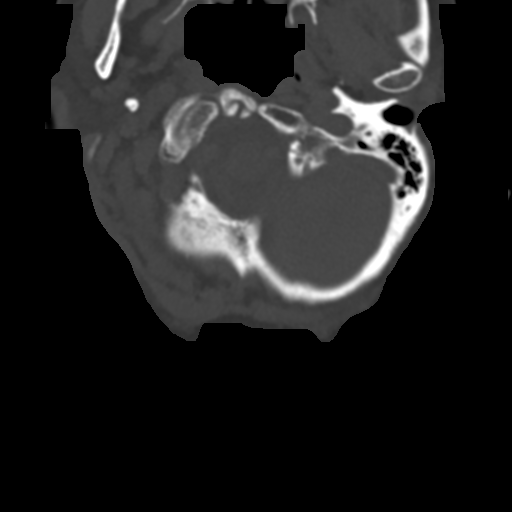

[Series 5: sagittal bone · sagittal · 0.26mm/px · 5 of 68 slices shown, 6 images]
[im 23/68  bone]
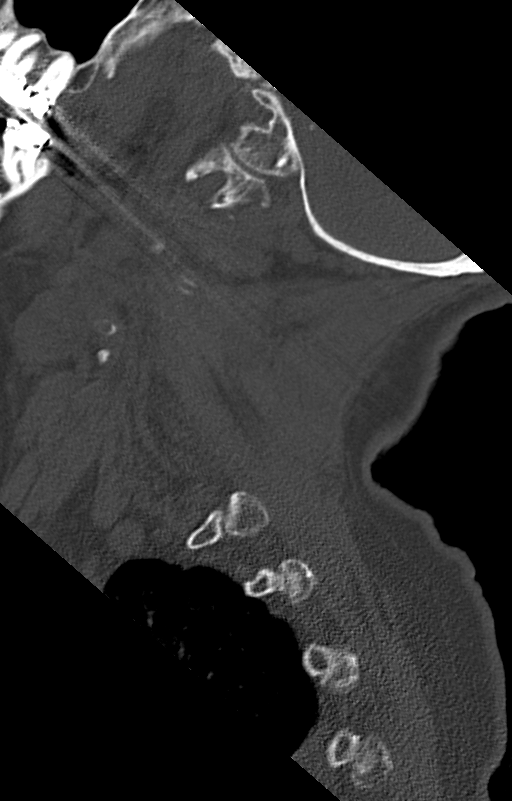
[im 28/68  bone]
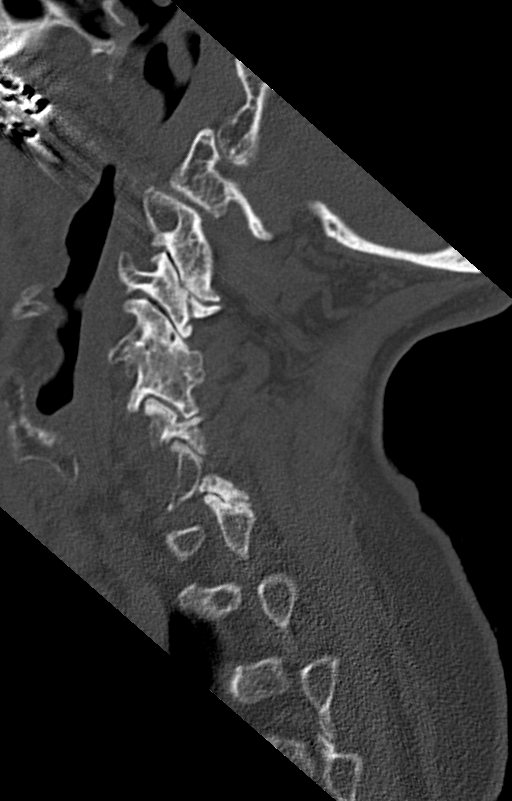
[im 34/68  soft-tissue]
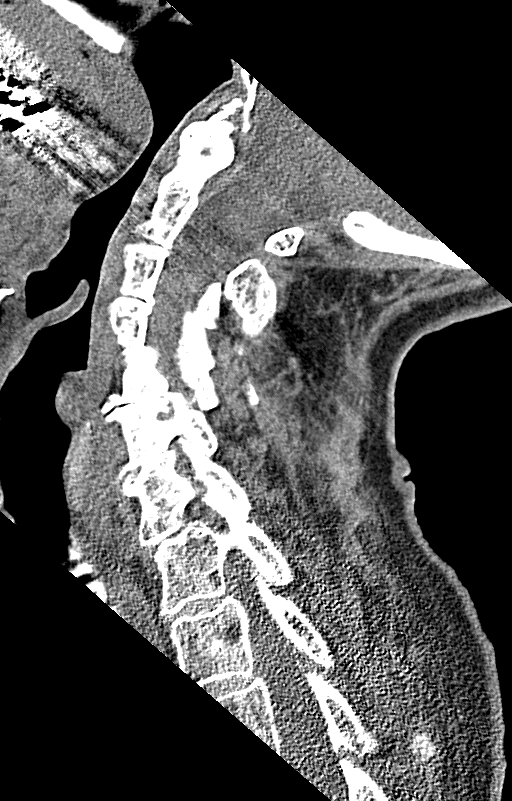
[im 34/68  bone]
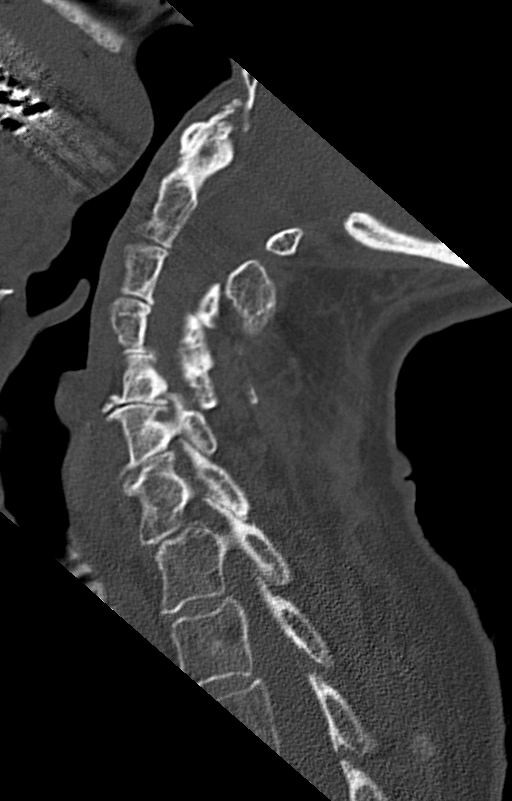
[im 40/68  bone]
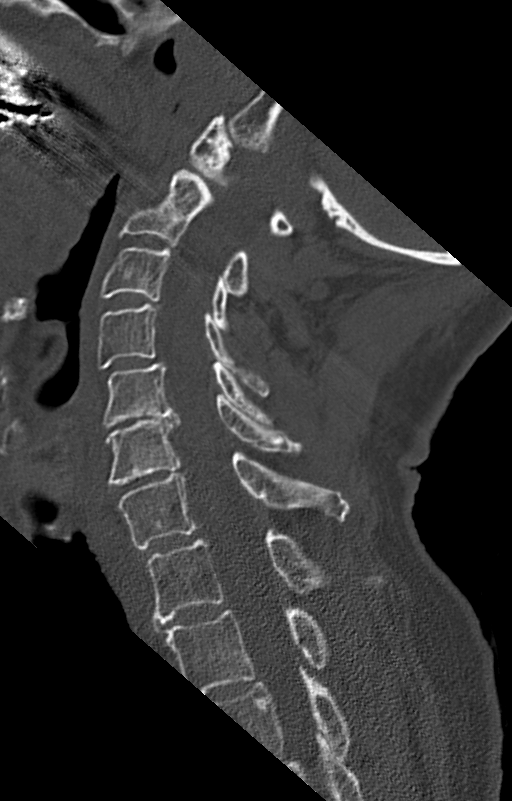
[im 45/68  bone]
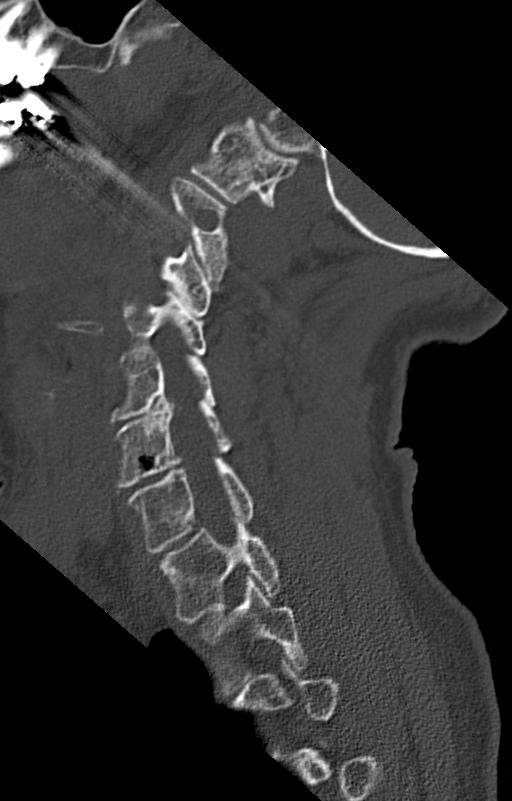

[12 of 27 positions shown; findings below may reference images not displayed]

FINDINGS: Alignment: Exaggerated upper cervical lordosis. Mild degenerative
appearing spondylolisthesis at both C4-C5 and C7-T1. Mild
straightening of the lower cervical spine. Mild to moderate
dextroconvex cervicothoracic scoliosis apex at the cervicothoracic
junction. Bilateral posterior element alignment is within normal
limits.

Skull base and vertebrae: Visualized skull base is intact. No
atlanto-occipital dissociation. C1 and C2 are intact and normally
aligned. No acute osseous abnormality identified.

Soft tissues and spinal canal: No prevertebral fluid or swelling. No
visible canal hematoma. Asymmetry of the submandibular glands
appears related to atrophy of the gland on the right. Negative
visible noncontrast neck soft tissues.

Disc levels:

C1-C2: Bulky anterior C1-C2 degenerative spurring.

C2-C3: Moderate to severe facet hypertrophy on the left with vacuum
facet. No spinal stenosis. Severe left C3 foraminal stenosis.

C3-C4: Moderate facet hypertrophy greater on the left. Trace vacuum
facet on that side. Foraminal endplate spurring. No spinal stenosis.
But moderate to severe left and mild to moderate right C4 foraminal
stenosis.

C4-C5: Ankylosis of the left C4-C5 facets. Foraminal endplate
spurring on the left. Severe left C5 foraminal stenosis.

C5-C6: Severe disc space loss. Circumferential disc osteophyte
complex eccentric to the left. Mild to moderate facet hypertrophy
greater on the left. Possible mild spinal stenosis. Severe left and
moderate right C6 foraminal stenosis.

C6-C7: Disc space loss with endplate spurring greater on the left.
Mild facet hypertrophy on the left. No spinal stenosis but moderate
to severe left C7 foraminal stenosis.

C7-T1: Mild anterolisthesis and facet hypertrophy greater on the
left. No stenosis.

Upper chest: Partially visible layering left pleural effusion
(series 3, image 63) with simple fluid density. Otherwise negative
lung apices.

Other: Negative visible noncontrast posterior fossa.
IMPRESSION: 1. No acute osseous abnormality in the cervical spine. Mild
dextroconvex thoracolumbar scoliosis.

2. Widespread cervical spine degeneration, including multilevel
facet arthropathy superimposed on ankylosed left C4-C5 facets. Mild
spinal stenosis is possible at C5-C6. There is moderate or severe
degenerative neural foraminal stenosis at the left C3 through C7
nerve levels.

3. Partially visible layering left pleural effusion.

## 2023-06-23 ENCOUNTER — Other Ambulatory Visit: Payer: Self-pay

## 2023-06-23 DIAGNOSIS — I480 Paroxysmal atrial fibrillation: Secondary | ICD-10-CM

## 2023-06-23 DIAGNOSIS — I421 Obstructive hypertrophic cardiomyopathy: Secondary | ICD-10-CM

## 2023-06-23 MED ORDER — AMIODARONE HCL 200 MG PO TABS
100.0000 mg | ORAL_TABLET | ORAL | 2 refills | Status: DC
Start: 2023-06-23 — End: 2023-06-24

## 2023-06-24 MED ORDER — AMIODARONE HCL 200 MG PO TABS: 100.0000 mg | ORAL_TABLET | ORAL | 2 refills | Status: DC

## 2023-06-24 NOTE — Addendum Note (Signed)
Addended by: Adriana Simas, Baillie Mohammad L on: 06/24/2023 04:31 PM   Modules accepted: Orders

## 2023-07-10 ENCOUNTER — Encounter: Payer: Self-pay | Admitting: Cardiology

## 2023-07-10 ENCOUNTER — Ambulatory Visit (INDEPENDENT_AMBULATORY_CARE_PROVIDER_SITE_OTHER): Payer: Medicare Other | Admitting: Neurology

## 2023-07-10 ENCOUNTER — Encounter: Payer: Self-pay | Admitting: Neurology

## 2023-07-10 VITALS — BP 133/68 | HR 69 | Ht 62.0 in | Wt 120.0 lb

## 2023-07-10 DIAGNOSIS — Z993 Dependence on wheelchair: Secondary | ICD-10-CM

## 2023-07-10 DIAGNOSIS — R0683 Snoring: Secondary | ICD-10-CM

## 2023-07-10 DIAGNOSIS — G35 Multiple sclerosis: Secondary | ICD-10-CM

## 2023-07-10 DIAGNOSIS — G4719 Other hypersomnia: Secondary | ICD-10-CM

## 2023-07-10 DIAGNOSIS — G801 Spastic diplegic cerebral palsy: Secondary | ICD-10-CM | POA: Diagnosis not present

## 2023-07-10 DIAGNOSIS — R269 Unspecified abnormalities of gait and mobility: Secondary | ICD-10-CM

## 2023-07-10 DIAGNOSIS — G250 Essential tremor: Secondary | ICD-10-CM

## 2023-07-10 DIAGNOSIS — N3941 Urge incontinence: Secondary | ICD-10-CM

## 2023-07-10 MED ORDER — MODAFINIL 200 MG PO TABS: 200.0000 mg | ORAL_TABLET | Freq: Every day | ORAL | 5 refills | Status: DC

## 2023-07-10 NOTE — Progress Notes (Signed)
 [facility]  PATIENT: Tina Webb DOB: 04/22/1934  REFERRING DOCTOR OR PCP:  Ozell Hives (PCP); Donnice Lee (neurology) SOURCE: Patient, notes from Dr. Lee, imaging reports, MRI images personally reviewed.  _________________________________   HISTORICAL  CHIEF COMPLAINT:  Chief Complaint  Patient presents with   Follow-up    Pt in room 10 alone. Here for MS follow up. Pt reports having good and bad days, today is a good day. No recent falls. Pt said she falls alseep when eating or playing cards. Takes melatonin at bedtime. States she sleep well at night. Pt said her ADL'S are becoming difficulty.     HISTORY OF PRESENT ILLNESS:  Tina Webb is a 89 y.o. woman with multiple sclerosis diagnosed in 1984.  Update 07/10/2023 She is noting that she has more EDS.  She snores but has not been told she has OSA signs.   Reviewing her med list - she is on gabapentin  300 mg po bid and we discussed stopping the AM dose.   She has never had a sleep study.   She has never tried modafinil   EPWORTH SLEEPINESS SCALE  On a scale of 0 - 3 what is the chance of dozing:  Sitting and Reading:   2 Watching TV:    2 Sitting inactive in a public place: 3 Passenger in car for one hour: 3 Lying down to rest in the afternoon: 3 Sitting and talking to someone: 2 Sitting quietly after lunch:  0 In a car, stopped in traffic:  N/a  Total (out of 24):    15/21 (moderate EDS).   She remains wheelchair-bound.  She can transfer with minimal assistance.  Her arms are fairly strong but have reduced coordination.  She also has essential tremor sometimes makes it difficult for her to do tasks with her hands.  Additionally, she has a torn rotator cuff in the left arm.    Patient is unchanged.  Bladder function is unchanged.  Solifenacin  has helped her urinary incontinence and is better tolerated than Detrol .   She goes to urology.     She is on metoprolol  for her tremor and  heart and it helpd.  It likely helped her not  have a rapid AFib (HR was 95).    She sees Dr. Ladona.  She is also on amiodarone .   She has a trainer come to help exercise a few times a week.     She has mild fatigue.   Mood is fine.    Cognition is doing well  She had radiation at [facility] for a skin rash that would not go away.    MS History: She was diagnosed with MS in 1984 after presenting with numbness in both legs.  She had her husband have recently moved to the [place] area from [place] .  While in [place], she was seen Dr. Donnice Lee for her multiple sclerosis.    She reports that the numbness that presented in 1984 was present in both legs.  She improved over time and did not receive any steroids at that time..  Initially, she was not on any DMT but started Copaxone in the 1990's.   She had progression and over the years was also on Rebif, Tecfidera, Aubagio, Zinbryta and Gilenya.  On over the medications, she continued to have slow worsening of her disability.   She did have some definite motor and visual exacerbations in the 1980's and 1990's.  She had a severe UTI in 2018  and she stopped all DMTs at that time.    She has a bladder stimulator (not functional) and is unable to get an MRI.   Her last MRI was around 2013.  She has worked with a systems analyst and done physical therapy.  This has helped her gain better ability to transfer.   MRI of the brain and cervical spine from 06/10/2012 was personally reviewed.  The MRI of the cervical spine showed T2 hyperintense foci adjacent to C1-C2, C4 and C7 posteriorly and adjacent to C5-C6 to the left.  Additionally there are degenerative changes at C5-C6 causing mild spinal stenosis and right greater than left foraminal narrowing.  The MRI of the brain showed multiple T2/FLAIR hyperintense foci in the periventricular, juxtacortical and deep white matter of both hemispheres.  Additionally there is a T2 hyperintense focus anteriorly within  the pontomedullary junction towards the right.  REVIEW OF SYSTEMS: Constitutional: No fevers, chills, sweats, or change in appetite.  She has fatigue.  Eyes: No visual changes, double vision, eye pain Ear, nose and throat: No hearing loss, ear pain, nasal congestion, sore throat Cardiovascular: No chest pain, palpitations Respiratory:  No shortness of breath at rest or with exertion.   No wheezes GastrointestinaI: No nausea, vomiting, diarrhea, abdominal pain, fecal incontinence Genitourinary: Urinary frequency, urgency and multiple UTIs Musculoskeletal: She often has left shoulder pain.   Integumentary: No rash, pruritus, skin lesions Neurological: as above Psychiatric: No depression at this time.  No anxiety Endocrine: No palpitations, diaphoresis, change in appetite, change in weigh or increased thirst Hematologic/Lymphatic:  No anemia, purpura, petechiae. Allergic/Immunologic: No itchy/runny eyes, nasal congestion, recent allergic reactions, rashes  ALLERGIES: No Known Allergies  HOME MEDICATIONS:  Current Outpatient Medications:    amiodarone  (PACERONE ) 200 MG tablet, Take 0.5 tablets (100 mg total) by mouth every other day., Disp: 24 tablet, Rfl: 2   AMPYRA  10 MG TB12, Take 1 tablet (10 mg total) by mouth in the morning and at bedtime., Disp: , Rfl:    apixaban  (ELIQUIS ) 2.5 MG TABS tablet, Take 1 tablet (2.5 mg total) by mouth 2 (two) times daily., Disp: 180 tablet, Rfl: 1   baclofen  (LIORESAL ) 10 MG tablet, TAKE 1/2 TABLET TWICE DAILY, Disp: 90 tablet, Rfl: 2   Cranberry-Vitamin C-Probiotic (AZO CRANBERRY PO), Take 1 tablet by mouth daily., Disp: , Rfl:    Desoximetasone (TOPICORT) 0.25 % ointment, Apply 1 Application topically 2 (two) times daily., Disp: , Rfl:    estradiol (ESTRING) 2 MG vaginal ring, Place 2 mg vaginally every 3 (three) months. follow package directions, Disp: , Rfl:    gabapentin  (NEURONTIN ) 300 MG capsule, Take 300-600 mg by mouth 3 (three) times daily.,  Disp: , Rfl:    levothyroxine  (SYNTHROID ) 75 MCG tablet, Take 75 mcg by mouth daily before breakfast., Disp: , Rfl:    liothyronine  (CYTOMEL ) 5 MCG tablet, Take 10 mcg by mouth daily., Disp: , Rfl:    MELATONIN PO, Take by mouth., Disp: , Rfl:    methenamine  (HIPREX) 1 g tablet, Take 1 g by mouth daily., Disp: , Rfl:    modafinil  (PROVIGIL ) 200 MG tablet, Take 1 tablet (200 mg total) by mouth daily., Disp: 30 tablet, Rfl: 5   nitrofurantoin  (MACRODANTIN ) 50 MG capsule, Take 50 mg by mouth daily., Disp: , Rfl:    pantoprazole  (PROTONIX ) 40 MG tablet, Take 40 mg by mouth daily., Disp: , Rfl:    polyethylene glycol (MIRALAX  / GLYCOLAX ) 17 g packet, Take 17 g by mouth daily  as needed for mild constipation., Disp: , Rfl:    Probiotic Product (PROBIOTIC PO), Take 2 tablets by mouth daily., Disp: , Rfl:    solifenacin  (VESICARE ) 5 MG tablet, Take 1 tablet by mouth daily., Disp: , Rfl:    VITAMIN D PO, Take 4,000 Units by mouth daily., Disp: , Rfl:    folic acid (FOLVITE) 1 MG tablet, Take 1 mg by mouth daily. (Patient not taking: Reported on 07/10/2023), Disp: , Rfl:    methotrexate (RHEUMATREX) 2.5 MG tablet, Take 15 mg by mouth once a week. Caution:Chemotherapy. Protect from light. 6 tablets weekley (Patient not taking: Reported on 07/10/2023), Disp: , Rfl:   PAST MEDICAL HISTORY: Past Medical History:  Diagnosis Date   Anxiety    High cholesterol    Hypertrophic cardiomyopathy (HCC)    Multiple sclerosis (HCC)    Paroxysmal A-fib (HCC)    Rotator cuff tear    left    PAST SURGICAL HISTORY: Past Surgical History:  Procedure Laterality Date   BLADDER SURGERY  2018   STEM placement    IR THORACENTESIS ASP PLEURAL SPACE W/IMG GUIDE  01/01/2023   ULNAR NERVE REPAIR Right 2010    FAMILY HISTORY: Family History  Problem Relation Age of Onset   Heart attack Mother    Transient ischemic attack Mother    Arthritis Mother    Heart Problems Father     SOCIAL HISTORY:  Social History    Socioeconomic History   Marital status: Married    Spouse name: Sim   Number of children: 3   Years of education: [employer] education level: Not on file  Occupational History   Occupation: Retired  Tobacco Use   Smoking status: Never   Smokeless tobacco: Never  Vaping Use   Vaping status: Never Used  Substance and Sexual Activity   Alcohol use: Yes    Comment: 2 drinks per week   Drug use: Never   Sexual activity: Not Currently  Other Topics Concern   Not on file  Social History Narrative   Originally right handed but had surgery and now uses left hand for eating and other activities   Caffeine use: 2 cups coffee per day   Lives with husband   Lives at [place] with husband in [place]   Social Drivers of Health   Financial Resource Strain: Not on file  Food Insecurity: Not on file  Transportation Needs: Not on file  Physical Activity: Not on file  Stress: Not on file  Social Connections: Not on file  Intimate Partner Violence: Not on file     PHYSICAL EXAM  Vitals:   07/10/23 1101  BP: 133/68  Pulse: 69  Weight: 120 lb (54.4 kg)  Height: 5' 2 (1.575 m)     Body mass index is 21.95 kg/m.   General: The patient is well-developed and well-nourished and in no acute distress.       HEENT:  Head is [place]/AT.  Sclera are anicteric.   .  Skin: Extremities are without rash but she has bilateral pedal edema and some skin abrasions.    Neurologic Exam  Mental status: The patient is alert and oriented x 3 at the time of the examination. The patient has apparent normal recent and remote memory, with an apparently normal attention span and concentration ability.   Speech is normal.  Cranial nerves: Extraocular movements are full.  Color vision was symmetric.  Facial strength and sensation is normal.. No obvious hearing deficits  are noted.  Motor: Her intention tremor in the hands has improved.  Muscle bulk is reduced in the ulnar innervated hand  muscles bilaterally, right worse than left.   Tone is increased in the right leg.  Proximal arm strength or 4+-5 on the right and 4+ on the left.. Strength is 3/5 in the ulnar innervated hand muscles on the right.  Proximal muscles on the right 4/5.  In the left arm, strength was 4 to 4+ /5.  In the legs, strength was 2/5 in the left hip flexors and 4-/5 in the quadriceps and 3/5 elsewhere in the leg.  In the right leg, strength was 1/5 at the hip flexors, 2+/5 in the quadricep muscles and 2- to 2 in the lower leg.  Sensory: Sensory testing was intact to touch and vibration sensation in the legs..  Coordination: Cerebellar testing reveals good finger-nose-finger and she can't do heel-to-shin bilaterally.  Other: She has a rapid tremor in her hands that increases with intention.  Gait and station: She cannot stand or walk  Reflexes: Deep tendon reflexes are increased in legs, right > left.          ASSESSMENT AND PLAN  Multiple sclerosis (HCC)  Spastic diplegia (HCC)  Gait disorder  Wheelchair dependence  Essential tremor  Urge incontinence of urine  Excessive daytime sleepiness  1.   She has an inactive form of secondary progressive MS with slow progression..  This is unlikely to respond much to medication.  Therefore, we will have her continue off of a disease modifying therapy  2.   Modafinil  100-200 mg in AM.   If EDS not better, consider a HST to assess for OSA.   3.  Reduce gabapentin  to once daily at night.   Continue baclofen  and other medications.   4. wheelchair with ability to elevate and recline.  She will need leg elevation.  Control should be on either side. ,6. Return in 6 months or sooner if there are new or worsening neurologic symptoms.   [id number]:  This visit is part of a comprehensive longitudinal care medical relationship regarding the patients primary diagnosis of SPMS and related concerns.    Daytona Hedman A. Vear, MD, [facility] 07/10/2023, 12:42 PM Certified in  Neurology, Clinical Neurophysiology, Sleep Medicine and Neuroimaging  [facility] [address], [address] [place], [place] [address] [phone]

## 2023-07-24 ENCOUNTER — Telehealth: Payer: Self-pay | Admitting: Neurology

## 2023-07-24 NOTE — Telephone Encounter (Signed)
I called pt and LMVM for them if noted that having loose stool with the taking of new medication modafinil then she can stop and see if she gets better.  I relayed there are GI viruses going around, so hopefully that is not the case for her.  She can call us back on Monday.

## 2023-07-24 NOTE — Telephone Encounter (Signed)
Pt called wanting to speak to the RN regarding a change she has noticed since she started the modafinil (PROVIGIL) 200 MG tablet. She states that ever since she started it she has developed loose stool to the point that every time she goes to pee she also has a discharge. Please advise.

## 2023-07-30 ENCOUNTER — Ambulatory Visit (INDEPENDENT_AMBULATORY_CARE_PROVIDER_SITE_OTHER): Payer: Medicare Other | Admitting: Orthopedic Surgery

## 2023-07-30 ENCOUNTER — Other Ambulatory Visit: Payer: Self-pay

## 2023-07-30 ENCOUNTER — Encounter: Payer: Self-pay | Admitting: Orthopedic Surgery

## 2023-07-30 DIAGNOSIS — M19012 Primary osteoarthritis, left shoulder: Secondary | ICD-10-CM | POA: Diagnosis not present

## 2023-07-30 MED ORDER — BUPIVACAINE HCL 0.5 % IJ SOLN
9.0000 mL | INTRAMUSCULAR | Status: AC | PRN
  Administered 2023-07-30: 9 mL via INTRA_ARTICULAR

## 2023-07-30 MED ORDER — METHYLPREDNISOLONE ACETATE 40 MG/ML IJ SUSP
40.0000 mg | INTRAMUSCULAR | Status: AC | PRN
  Administered 2023-07-30: 40 mg via INTRA_ARTICULAR

## 2023-07-30 MED ORDER — LIDOCAINE HCL 1 % IJ SOLN
5.0000 mL | INTRAMUSCULAR | Status: AC | PRN
  Administered 2023-07-30: 5 mL

## 2023-07-30 NOTE — Progress Notes (Signed)
Office Visit Note   Patient: Tina Webb           Date of Birth: Nov 23, 1933           MRN: [id number] Visit Date: 07/30/2023 Requested by: No referring provider defined for this encounter. PCP: System, Provider Not In  Subjective: Chief Complaint  Patient presents with   Left Shoulder - Pain    04/02/23 LT Glenohumeral injection    HPI: Tina Webb is a 89 y.o. female who presents to the office reporting continuing on and off left shoulder pain.  She has episodic forearm deep pain which is likely neurogenic in origin.  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  Otherwise she has been doing well.  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  That has cured her..                ROS: All systems reviewed are negative as they relate to the chief complaint within the history of present illness.  Patient denies fevers or chills.  Assessment & Plan: Visit Diagnoses:  1. Arthritis of left shoulder region     Plan: Impression is left shoulder arthritis with good relief from episodic injections.  Repeat injection performed again today.  We will see her back when she wants to consider another injection for the shoulder.  Would not want to do more than approximately 3/year.  Follow-Up Instructions: No follow-ups on file.   Orders:  Orders Placed This Encounter  Procedures   US Guided Needle Placement - No Linked Charges   No orders of the defined types were placed in this encounter.     Procedures: Large Joint Inj: L glenohumeral on 07/30/2023 1:13 PM Indications: diagnostic evaluation and pain Details: 22 G 1.5 in needle, ultrasound-guided posterior approach  Arthrogram: No  Medications: 9 mL bupivacaine 0.5 %; 40 mg methylPREDNISolone acetate 40 MG/ML; 5 mL lidocaine 1 % Outcome: tolerated well, no  immediate complications Procedure, treatment alternatives, risks and benefits explained, specific risks discussed. Consent was given by the patient. Immediately prior to procedure a time out was called to verify the correct patient, procedure, equipment, support staff and site/side marked as required. Patient was prepped and draped in the usual sterile fashion.       Clinical Data: No additional findings.  Objective: Vital Signs: There were no vitals taken for this visit.  Physical Exam:  Constitutional: Patient appears well-developed HEENT:  Head: Normocephalic Eyes:EOM are normal Neck: Normal range of motion Cardiovascular: Normal rate Pulmonary/chest: Effort normal Neurologic: Patient is alert Skin: Skin is warm Psychiatric: Patient has normal mood and affect  Ortho Exam: Ortho exam demonstrates coarseness and grinding with passive range of motion of that left shoulder.  Deltoid does seem functional.  Radial pulses intact.  No masses lymphadenopathy or skin changes noted in that shoulder girdle region.  Specialty Comments:  No specialty comments available.  Imaging: US Guided Needle Placement - No Linked Charges Result Date: 07/30/2023 Ultrasound imaging demonstrates needle placement into the left glenohumeral joint with extravasation of fluid into the joint and no complicating features    PMFS History: Patient Active Problem List   Diagnosis Date Noted   Encephalopathy 03/12/2023   Acute on chronic diastolic CHF (congestive heart failure) (HCC) 12/30/2022   Soft tissue mass 12/30/2022   Bilateral pleural effusion 12/30/2022  Acute respiratory failure with hypoxia (HCC) 12/30/2022   UTI (urinary tract infection) 12/30/2022   Paroxysmal atrial fibrillation (HCC) 12/30/2022   Leg wound, left 12/30/2022   Constipation 12/30/2022   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   Wheelchair dependence 12/24/2022   Essential tremor 08/23/2021   Primary osteoarthritis of left  shoulder 10/05/2020   Chronic left shoulder pain 08/10/2020   Chronic night sweats 08/09/2020   Multiple sclerosis (HCC) 11/02/2019   Gait disorder 11/02/2019   Weakness 11/02/2019   Urge incontinence of urine 11/02/2019   Foot-drop 10/06/2019   Senile osteoporosis 10/06/2019   Contracted bladder 06/17/2016   Injury of tendon of rotator cuff 04/25/2016   Neurogenic bladder 04/25/2016   Hypothyroidism 08/16/2010   Hypertrophic cardiomyopathy (HCC) 07/08/2010   Vitamin D deficiency 11/13/2009   Gastroesophageal reflux disease 08/14/2009   Past Medical History:  Diagnosis Date   Anxiety    High cholesterol    Hypertrophic cardiomyopathy (HCC)    Multiple sclerosis (HCC)    Paroxysmal A-fib (HCC)    Rotator cuff tear    left    Family History  Problem Relation Age of Onset   Heart attack Mother    Transient ischemic attack Mother    Arthritis Mother    Heart Problems Father     Past Surgical History:  Procedure Laterality Date   BLADDER SURGERY  2018   STEM placement    IR THORACENTESIS ASP PLEURAL SPACE W/IMG GUIDE  01/01/2023   ULNAR NERVE REPAIR Right 2010   Social History   Occupational History   Occupation: Retired  Tobacco Use   Smoking status: Never   Smokeless tobacco: Never  Vaping Use   Vaping status: Never Used  Substance and Sexual Activity   Alcohol use: Yes    Comment: 2 drinks per week   Drug use: Never   Sexual activity: Not Currently

## 2023-08-16 ENCOUNTER — Other Ambulatory Visit: Payer: Self-pay | Admitting: Neurology

## 2023-08-20 NOTE — Telephone Encounter (Signed)
Rx refilled per last office visit note.

## 2023-08-26 ENCOUNTER — Other Ambulatory Visit: Payer: Self-pay | Admitting: Neurology

## 2023-08-26 DIAGNOSIS — R269 Unspecified abnormalities of gait and mobility: Secondary | ICD-10-CM

## 2023-08-26 DIAGNOSIS — G35 Multiple sclerosis: Secondary | ICD-10-CM

## 2023-08-26 NOTE — Telephone Encounter (Signed)
Dr. Epimenio Foot,  This was being prescribed elsewhere. Did you wish to take over. Your last  note didn't mention it: 1.   She has an inactive form of secondary progressive MS with slow progression..  This is unlikely to respond much to medication.  Therefore, we will have her continue off of a disease modifying therapy  2.   Modafinil 100-200 mg in AM.   If EDS not better, consider a HST to assess for OSA.   3.  Reduce gabapentin to once daily at night.   Continue baclofen and other medications.   4. wheelchair with ability to elevate and recline.  She will need leg elevation.  Control should be on either side. ,6. Return in 6 months or sooner if there are new or worsening neurologic symptoms.

## 2023-09-02 ENCOUNTER — Encounter: Payer: Self-pay | Admitting: Cardiology

## 2023-09-02 ENCOUNTER — Ambulatory Visit: Payer: Medicare Other | Attending: Cardiology | Admitting: Cardiology

## 2023-09-02 VITALS — BP 118/60 | HR 76 | Resp 16 | Ht 62.0 in | Wt 120.0 lb

## 2023-09-02 DIAGNOSIS — I421 Obstructive hypertrophic cardiomyopathy: Secondary | ICD-10-CM

## 2023-09-02 DIAGNOSIS — Z66 Do not resuscitate: Secondary | ICD-10-CM

## 2023-09-02 DIAGNOSIS — I447 Left bundle-branch block, unspecified: Secondary | ICD-10-CM

## 2023-09-02 DIAGNOSIS — I480 Paroxysmal atrial fibrillation: Secondary | ICD-10-CM | POA: Diagnosis not present

## 2023-09-02 NOTE — Progress Notes (Signed)
 Cardiology Office Note:  .   Date:  09/02/2023  ID:  Tina Webb, DOB May 25, 1934, MRN [id number] PCP: Nadara Eaton, MD  [facility] Providers Cardiologist:  Yates Decamp, MD   History of Present Illness: .   Tina Webb is a 89 y.o. with history of HOCM, atrial fibrillation, LBBB, prolonged QT on EKG, chronic sinus bradycardia and on long-term amiodarone and Inderal LA in view of HOCM to induce bradycardia, esophageal dysphagia, hypothyroid, multiple sclerosis, hyperlipidemia, syncopal episode, with monitoring found to have persistent atrial fibrillation RVR. She is presently wheelchair-bound due to multiple sclerosis.  She has been on amiodarone and had cardioversion on 05/08/2022 and has been maintaining sinus rhythm.    Discussed the use of AI scribe software for clinical note transcription with the patient, who gave verbal consent to proceed.  History of Present Illness   The patient, with a history of cardiac arrhythmia and lower extremity edema, presents for a routine follow-up. She reports feeling well overall, with no new or worsening symptoms. She has been adhering to her prescribed medication regimen, which includes Eliquis and amiodarone. She reports occasional sleepiness, particularly when engaged in activities such as playing cards, but has chosen not to take additional medication for this symptom. She is mobile with the assistance of a motorized wheelchair and is able to perform daily activities independently.        Labs   Lab Results  Component Value Date   NA 137 03/13/2023   K 3.7 03/13/2023   CO2 21 (L) 03/13/2023   GLUCOSE 91 03/13/2023   BUN 29 (H) 03/13/2023   CREATININE 0.47 03/13/2023   CALCIUM 8.0 (L) 03/13/2023   GFRNONAA >60 03/13/2023      Latest Ref Rng & Units 03/13/2023    4:35 AM 03/12/2023   11:47 AM 01/02/2023    5:40 AM  BMP  Glucose 70 - 99 mg/dL 91  87  96   BUN 8 - 23 mg/dL 29  41  20   Creatinine 0.44 - 1.00 mg/dL 2.13   0.86  5.78   Sodium 135 - 145 mmol/L 137  140  136   Potassium 3.5 - 5.1 mmol/L 3.7  4.1  4.6   Chloride 98 - 111 mmol/L 110  109  106   CO2 22 - 32 mmol/L 21  24  24    Calcium 8.9 - 10.3 mg/dL 8.0  8.7  8.6       Latest Ref Rng & Units 03/13/2023    4:35 AM 03/12/2023   11:47 AM 01/02/2023    5:40 AM  CBC  WBC 4.0 - 10.5 K/uL 6.1  6.8  9.0   Hemoglobin 12.0 - 15.0 g/dL 46.9  62.9  52.8   Hematocrit 36.0 - 46.0 % 39.7  45.6  38.6   Platelets 150 - 400 K/uL 274  295  430    No results found for: "HGBA1C"  Lab Results  Component Value Date   TSH 1.774 03/12/2023   Review of Systems  Cardiovascular:  Negative for chest pain, dyspnea on exertion and leg swelling.   Physical Exam:   VS:  BP 118/60 (BP Location: Left Arm, Patient Position: Sitting, Cuff Size: Normal)   Pulse 76   Resp 16   Ht 5\' 2"  (1.575 m)   Wt 120 lb (54.4 kg)   SpO2 94%   BMI 21.95 kg/m    Wt Readings from Last 3 Encounters:  09/02/23 120 lb (54.4 kg)  07/10/23 120 lb (54.4 kg)  03/11/23 109 lb (49.4 kg)    Physical Exam Neck:     Vascular: No JVD.  Cardiovascular:     Rate and Rhythm: Normal rate and regular rhythm.     Heart sounds: S1 normal and S2 normal. Murmur heard.     Early systolic murmur is present with a grade of 2/6 at the upper right sternal border.     No gallop.  Pulmonary:     Effort: Pulmonary effort is normal.     Breath sounds: Normal breath sounds.  Abdominal:     General: Bowel sounds are normal.     Palpations: Abdomen is soft.  Musculoskeletal:     Right lower leg: Edema (2+ pitting bilateral below knee edema) present.     Left lower leg: Edema (2+ pitting bilateral below knee edema) present.    Studies Reviewed: .    Echocardiogram 12/31/2022:  1. Know history of HCM, increased resting gradient across the LVOT. Left ventricular ejection fraction, by estimation, is 55 to 60%. The left ventricle has normal function. The left ventricle demonstrates global hypokinesis. There  is severe asymmetric  left ventricular hypertrophy of the septal segment. Left ventricular diastolic function could not be evaluated. Elevated left ventricular end-diastolic pressure. The average left ventricular global longitudinal strain is -14.1 %. The global longitudinal  strain is abnormal.  2. Right ventricular systolic function was not well visualized. The right ventricular size is normal.  3. Left atrial size was severely dilated.  4. The mitral valve is degenerative. Moderate mitral valve regurgitation. No evidence of mitral stenosis. Moderate mitral annular calcification. EKG:    EKG Interpretation Date/Time:  Tuesday September 02 2023 10:44:54 EST Ventricular Rate:  63 PR Interval:  170 QRS Duration:  146 QT Interval:  500 QTC Calculation: 511 R Axis:   -49  Text Interpretation: EKG 09/02/2023: Normal sinus rhythm at the rate of 63 bpm, left bundle branch block.  Compared to 03/12/2023, no change. Confirmed by Delrae Rend [phone]) on 09/02/2023 10:51:46 AM    EKG 03/11/2023: Normal sinus rhythm at rate of 71 bpm, left atrial enlargement, left bundle branch block. No further analysis. Compared to 11/20/2022, heart rate is improved from 48 bpm.   Medications and allergies    No Known Allergies   Current Outpatient Medications:    amiodarone (PACERONE) 200 MG tablet, Take 0.5 tablets (100 mg total) by mouth every other day., Disp: 24 tablet, Rfl: 2   apixaban (ELIQUIS) 2.5 MG TABS tablet, Take 1 tablet (2.5 mg total) by mouth 2 (two) times daily., Disp: 180 tablet, Rfl: 1   baclofen (LIORESAL) 10 MG tablet, TAKE 1/2 TABLET TWICE DAILY, Disp: 90 tablet, Rfl: 3   Cranberry-Vitamin C-Probiotic (AZO CRANBERRY PO), Take 1 tablet by mouth daily., Disp: , Rfl:    dalfampridine (AMPYRA) 10 MG TB12, TAKE 1 TABLET BY MOUTH DAILY IN THE MORNING AND AT BEDTIME, Disp: 60 tablet, Rfl: 11   Desoximetasone (TOPICORT) 0.25 % ointment, Apply 1 Application topically 2 (two) times daily., Disp: ,  Rfl:    estradiol (ESTRING) 2 MG vaginal ring, Place 2 mg vaginally every 3 (three) months. follow package directions, Disp: , Rfl:    folic acid (FOLVITE) 1 MG tablet, Take 1 mg by mouth daily., Disp: , Rfl:    gabapentin (NEURONTIN) 300 MG capsule, Take 300-600 mg by mouth 3 (three) times daily., Disp: , Rfl:    levothyroxine (SYNTHROID) 75 MCG tablet, Take 75 mcg by mouth  daily before breakfast., Disp: , Rfl:    liothyronine (CYTOMEL) 5 MCG tablet, Take 10 mcg by mouth daily., Disp: , Rfl:    MELATONIN PO, Take by mouth., Disp: , Rfl:    methenamine (HIPREX) 1 g tablet, Take 1 g by mouth daily., Disp: , Rfl:    methotrexate (RHEUMATREX) 2.5 MG tablet, Take 15 mg by mouth once a week. Caution:Chemotherapy. Protect from light. 6 tablets weekley, Disp: , Rfl:    modafinil (PROVIGIL) 200 MG tablet, Take 1 tablet (200 mg total) by mouth daily., Disp: 30 tablet, Rfl: 5   nitrofurantoin (MACRODANTIN) 50 MG capsule, Take 50 mg by mouth daily., Disp: , Rfl:    pantoprazole (PROTONIX) 40 MG tablet, Take 40 mg by mouth daily., Disp: , Rfl:    polyethylene glycol (MIRALAX / GLYCOLAX) 17 g packet, Take 17 g by mouth daily as needed for mild constipation., Disp: , Rfl:    Probiotic Product (PROBIOTIC PO), Take 2 tablets by mouth daily., Disp: , Rfl:    solifenacin (VESICARE) 5 MG tablet, Take 1 tablet by mouth daily., Disp: , Rfl:    VITAMIN D PO, Take 4,000 Units by mouth daily., Disp: , Rfl:    ASSESSMENT AND PLAN: .      ICD-10-CM   1. Paroxysmal atrial fibrillation (HCC)  I48.0 EKG 12-Lead    2. HOCM (hypertrophic obstructive cardiomyopathy) (HCC)  I42.1     3. LBBB (left bundle branch block)  I44.7     4. DNR (do not resuscitate)  Z66       Assessment and Plan    Atrial Fibrillation   Atrial fibrillation is well-managed with a regular rhythm and a heart rate of 63 bpm. She is on amiodarone 100 mg every other day and apixaban, with blood pressure controlled at 118/60 mmHg. Propranolol was  discontinued due to bradycardia and fatigue. Current medications are well-tolerated. Continue amiodarone and apixaban. She also has underlying left bundle branch block and given her advanced age of 83 years, certainly would predispose her to heart block as well.  However she remains asymptomatic and is doing well.  Leg Swelling   Leg swelling remains unchanged and is managed with support stockings and keeping her feet elevated. Continue current management.  General Health Maintenance   She resides in an assisted living facility and uses a motorized wheelchair, navigating independently. Ensure continued access to the motorized wheelchair and maintain regular follow-up visits.  She is DNR as per her wishes.  Her husband is present and all questions answered.  Follow-up   Schedule a follow-up visit in six months.           Signed,  Yates Decamp, MD, [facility] 09/02/2023, 7:42 PM [facility] [address] [place], [place] [address] Phone: [phone]. Fax:  [phone]

## 2023-09-02 NOTE — Patient Instructions (Signed)
 Medication Instructions:  Your physician recommends that you continue on your current medications as directed. Please refer to the Current Medication list given to you today.  *If you need a refill on your cardiac medications before your next appointment, please call your pharmacy*   Lab Work: none If you have labs (blood work) drawn today and your tests are completely normal, you will receive your results only by: MyChart Message (if you have MyChart) OR A paper copy in the mail If you have any lab test that is abnormal or we need to change your treatment, we will call you to review the results.   Testing/Procedures: none   Follow-Up: At [facility], you and your health needs are our priority.  As part of our continuing mission to provide you with exceptional heart care, we have created designated Provider Care Teams.  These Care Teams include your primary Cardiologist (physician) and Advanced Practice Providers (APPs -  Physician Assistants and Nurse Practitioners) who all work together to provide you with the care you need, when you need it.  We recommend signing up for the patient portal called "MyChart".  Sign up information is provided on this After Visit Summary.  MyChart is used to connect with patients for Virtual Visits (Telemedicine).  Patients are able to view lab/test results, encounter notes, upcoming appointments, etc.  Non-urgent messages can be sent to your provider as well.   To learn more about what you can do with MyChart, go to [email or web address].    Your next appointment:   6 month(s)  Provider:   Yates Decamp, MD     Other Instructions

## 2023-09-03 ENCOUNTER — Telehealth: Payer: Self-pay | Admitting: Neurology

## 2023-09-03 DIAGNOSIS — R269 Unspecified abnormalities of gait and mobility: Secondary | ICD-10-CM

## 2023-09-03 DIAGNOSIS — G35 Multiple sclerosis: Secondary | ICD-10-CM

## 2023-09-03 MED ORDER — AMPYRA 10 MG PO TB12: 10.0000 mg | ORAL_TABLET | Freq: Two times a day (BID) | ORAL | 11 refills | Status: AC

## 2023-09-03 NOTE — Telephone Encounter (Signed)
 Pt says she was called by [facility] informing her that re: the [facility] a RN needs to call and inform them that she is to be on Brand only and not generic.  The # to call is [phone], pt says she was called by [place].  Pt asking to be called with the outcome

## 2023-09-03 NOTE — Telephone Encounter (Signed)
 Called [facility] pharmacy at [phone]. Spoke w/ Marya Landry. She transferred me to PA department. Spoke w/ [facility] clinical review team). PA already on file. Effective 04/08/2023-07/07/2024. Auth# [id number]. They extended approval from previous PA that expired 07/08/23. Auth# [id number]. Nothing further needed.  Resent rx to [facility] pharmacy for brand name that includes PA approval info.  I called pt and provided update. She verbalized understanding and appreciation.

## 2023-09-06 ENCOUNTER — Emergency Department (HOSPITAL_BASED_OUTPATIENT_CLINIC_OR_DEPARTMENT_OTHER)
Admission: EM | Admit: 2023-09-06 | Discharge: 2023-09-07 | Disposition: A | Discharge status: Home / Self Care | Attending: Emergency Medicine | Admitting: Emergency Medicine

## 2023-09-06 ENCOUNTER — Emergency Department (HOSPITAL_BASED_OUTPATIENT_CLINIC_OR_DEPARTMENT_OTHER)

## 2023-09-06 ENCOUNTER — Other Ambulatory Visit: Payer: Self-pay

## 2023-09-06 ENCOUNTER — Encounter (HOSPITAL_BASED_OUTPATIENT_CLINIC_OR_DEPARTMENT_OTHER): Payer: Self-pay | Admitting: Emergency Medicine

## 2023-09-06 DIAGNOSIS — K148 Other diseases of tongue: Secondary | ICD-10-CM | POA: Diagnosis not present

## 2023-09-06 DIAGNOSIS — K1120 Sialoadenitis, unspecified: Secondary | ICD-10-CM

## 2023-09-06 DIAGNOSIS — M542 Cervicalgia: Secondary | ICD-10-CM | POA: Diagnosis not present

## 2023-09-06 DIAGNOSIS — R221 Localized swelling, mass and lump, neck: Secondary | ICD-10-CM | POA: Insufficient documentation

## 2023-09-06 DIAGNOSIS — Z7901 Long term (current) use of anticoagulants: Secondary | ICD-10-CM | POA: Diagnosis not present

## 2023-09-06 LAB — BASIC METABOLIC PANEL
Anion gap: 10 (ref 5–15)
BUN: 34 mg/dL — ABNORMAL HIGH (ref 8–23)
CO2: 21 mmol/L — ABNORMAL LOW (ref 22–32)
Calcium: 8.9 mg/dL (ref 8.9–10.3)
Chloride: 108 mmol/L (ref 98–111)
Creatinine, Ser: 0.55 mg/dL (ref 0.44–1.00)
GFR, Estimated: >60 mL/min (ref >60)
Glucose, Bld: 88 mg/dL (ref 70–99)
Potassium: 3.8 mmol/L (ref 3.5–5.1)
Sodium: 139 mmol/L (ref 135–145)

## 2023-09-06 LAB — CBC WITH DIFFERENTIAL/PLATELET
Abs Immature Granulocytes: 0.04 10*3/uL (ref 0.00–0.07)
Basophils Absolute: 0 10*3/uL (ref 0.0–0.1)
Basophils Relative: 0 %
Eosinophils Absolute: 0.1 10*3/uL (ref 0.0–0.5)
Eosinophils Relative: 1 %
HCT: 44 % (ref 36.0–46.0)
Hemoglobin: 14.1 g/dL (ref 12.0–15.0)
Immature Granulocytes: 0 %
Lymphocytes Relative: 11 %
Lymphs Abs: 1.1 10*3/uL (ref 0.7–4.0)
MCH: 28.2 pg (ref 26.0–34.0)
MCHC: 32 g/dL (ref 30.0–36.0)
MCV: 88 fL (ref 80.0–100.0)
Monocytes Absolute: 0.6 10*3/uL (ref 0.1–1.0)
Monocytes Relative: 6 %
Neutro Abs: 8.3 10*3/uL — ABNORMAL HIGH (ref 1.7–7.7)
Neutrophils Relative %: 82 %
Platelets: 250 10*3/uL (ref 150–400)
RBC: 5 MIL/uL (ref 3.87–5.11)
RDW: 15.9 % — ABNORMAL HIGH (ref 11.5–15.5)
WBC: 10.2 10*3/uL (ref 4.0–10.5)
nRBC: 0 % (ref 0.0–0.2)

## 2023-09-06 MED ORDER — SODIUM CHLORIDE 0.9 % IV SOLN
1.0000 g | Freq: Once | INTRAVENOUS | Status: AC
  Administered 2023-09-07: 1 g via INTRAVENOUS
  Filled 2023-09-06: qty 10

## 2023-09-06 MED ORDER — IOHEXOL 300 MG/ML  SOLN
75.0000 mL | Freq: Once | INTRAMUSCULAR | Status: AC | PRN
  Administered 2023-09-07: 75 mL via INTRAVENOUS

## 2023-09-06 MED ORDER — METRONIDAZOLE 500 MG/100ML IV SOLN
500.0000 mg | Freq: Once | INTRAVENOUS | Status: AC
  Administered 2023-09-07: 500 mg via INTRAVENOUS
  Filled 2023-09-06: qty 100

## 2023-09-06 MED ORDER — SODIUM CHLORIDE 0.9 % IV BOLUS
500.0000 mL | Freq: Once | INTRAVENOUS | Status: AC
  Administered 2023-09-07: 500 mL via INTRAVENOUS

## 2023-09-06 NOTE — ED Provider Notes (Signed)
 [facility] Provider Note   CSN: [id number] Arrival date & time: 09/06/23  1843     History  Chief Complaint  Patient presents with   Mouth Lesions   Neck Swelling    Tina Webb is a 89 y.o. female.  Patient is an 89 year old female with past medical history of MS, hypertrophic cardiomyopathy, paroxysmal A-fib.  Patient presenting today with complaints of sores under her tongue.  Patient woke up this morning with swelling and pain under the left side of her tongue.  She is now experiencing swelling and pain to the left side of her neck.  She describes some difficulty swallowing.  No fevers or chills.  No injury or trauma.  The history is provided by the patient.       Home Medications Prior to Admission medications   Medication Sig Start Date End Date Taking? Authorizing Provider  amiodarone (PACERONE) 200 MG tablet Take 0.5 tablets (100 mg total) by mouth every other day. 06/24/23   Yates Decamp, MD  AMPYRA 10 MG TB12 Take 1 tablet (10 mg total) by mouth in the morning and at bedtime. 09/03/23   Sater, Pearletha Furl, MD  apixaban (ELIQUIS) 2.5 MG TABS tablet Take 1 tablet (2.5 mg total) by mouth 2 (two) times daily. 04/25/23   Yates Decamp, MD  baclofen (LIORESAL) 10 MG tablet TAKE 1/2 TABLET TWICE DAILY 08/20/23   Sater, Pearletha Furl, MD  Cranberry-Vitamin C-Probiotic (AZO CRANBERRY PO) Take 1 tablet by mouth daily.    [provider]  Desoximetasone (TOPICORT) 0.25 % ointment Apply 1 Application topically 2 (two) times daily. 12/23/22 12/23/23  [provider]  estradiol (ESTRING) 2 MG vaginal ring Place 2 mg vaginally every 3 (three) months. follow package directions    [provider]  folic acid (FOLVITE) 1 MG tablet Take 1 mg by mouth daily.    [provider]  gabapentin (NEURONTIN) 300 MG capsule Take 300-600 mg by mouth 3 (three) times daily.    [provider]  levothyroxine (SYNTHROID)  75 MCG tablet Take 75 mcg by mouth daily before breakfast. 12/23/22   [provider]  liothyronine (CYTOMEL) 5 MCG tablet Take 10 mcg by mouth daily.    [provider]  MELATONIN PO Take by mouth.    [provider]  methenamine (HIPREX) 1 g tablet Take 1 g by mouth daily.    [provider]  methotrexate (RHEUMATREX) 2.5 MG tablet Take 15 mg by mouth once a week. Caution:Chemotherapy. Protect from light. 6 tablets weekley    [provider]  modafinil (PROVIGIL) 200 MG tablet Take 1 tablet (200 mg total) by mouth daily. 07/10/23   Sater, Pearletha Furl, MD  nitrofurantoin (MACRODANTIN) 50 MG capsule Take 50 mg by mouth daily.    [provider]  pantoprazole (PROTONIX) 40 MG tablet Take 40 mg by mouth daily. 04/01/22   [provider]  polyethylene glycol (MIRALAX / GLYCOLAX) 17 g packet Take 17 g by mouth daily as needed for mild constipation.    [provider]  Probiotic Product (PROBIOTIC PO) Take 2 tablets by mouth daily.    [provider]  solifenacin (VESICARE) 5 MG tablet Take 1 tablet by mouth daily. 02/21/22   [provider]  VITAMIN D PO Take 4,000 Units by mouth daily.    [provider]      Allergies    Patient has no known allergies.  Review of Systems   Review of Systems  All other systems reviewed and are negative.   Physical Exam Updated Vital Signs BP (!) 159/74   Pulse 69   Temp 98.2 F (36.8 C)   Resp 18   Ht 5\' 2"  (1.575 m)   Wt 54.4 kg   SpO2 99%   BMI 21.94 kg/m  Physical Exam Vitals and nursing note reviewed.  Constitutional:      General: She is not in acute distress.    Appearance: She is well-developed. She is not diaphoretic.  HENT:     Head: Normocephalic and atraumatic.     Mouth/Throat:     Comments: There is a vesicular lesion with surrounding swelling noted under the left side of the tongue. Neck:     Comments: There is swelling and tenderness  noted to the left side of the neck.  No palpable crepitus or fluctuance.  There is mild erythema of the overlying skin. Cardiovascular:     Rate and Rhythm: Normal rate and regular rhythm.     Heart sounds: No murmur heard.    No friction rub. No gallop.  Pulmonary:     Effort: Pulmonary effort is normal. No respiratory distress.     Breath sounds: Normal breath sounds. No stridor. No wheezing.  Abdominal:     General: Bowel sounds are normal. There is no distension.     Palpations: Abdomen is soft.     Tenderness: There is no abdominal tenderness.  Musculoskeletal:        General: Normal range of motion.     Cervical back: Normal range of motion and neck supple.  Skin:    General: Skin is warm and dry.  Neurological:     General: No focal deficit present.     Mental Status: She is alert and oriented to person, place, and time.     ED Results / Procedures / Treatments   Labs (all labs ordered are listed, but only abnormal results are displayed) Labs Reviewed  CBC WITH DIFFERENTIAL/PLATELET - Abnormal; Notable for the following components:      Result Value   RDW 15.9 (*)    Neutro Abs 8.3 (*)    All other components within normal limits  BASIC METABOLIC PANEL - Abnormal; Notable for the following components:   CO2 21 (*)    BUN 34 (*)    All other components within normal limits    EKG None  Radiology No results found.  Procedures Procedures  {Document cardiac monitor, telemetry assessment procedure when appropriate:1}  Medications Ordered in ED Medications  sodium chloride 0.9 % bolus 500 mL (has no administration in time range)  cefTRIAXone (ROCEPHIN) 1 g in sodium chloride 0.9 % 100 mL IVPB (has no administration in time range)  metroNIDAZOLE (FLAGYL) IVPB 500 mg (has no administration in time range)    ED Course/ Medical Decision Making/ A&P   {   Click here for ABCD2, HEART and other calculatorsREFRESH Note before signing :1}                               Medical Decision Making Amount and/or Complexity of Data Reviewed Radiology: ordered.  Risk Prescription drug management.   ***  {Document critical care time when appropriate:1} {Document review of labs and clinical decision tools ie heart score, Chads2Vasc2 etc:1}  {Document your independent review of radiology images, and any outside records:1} {Document your discussion  with family members, caretakers, and with consultants:1} {Document social determinants of health affecting pt's care:1} {Document your decision making why or why not admission, treatments were needed:1} Final Clinical Impression(s) / ED Diagnoses Final diagnoses:  None    Rx / DC Orders ED Discharge Orders     None

## 2023-09-06 NOTE — ED Notes (Signed)
 Pt to ct

## 2023-09-06 NOTE — ED Triage Notes (Signed)
 Pt with blister-like clusters inside mouth (under tongue and inside cheek); LT side neck swelling; sxs began last night

## 2023-09-07 DIAGNOSIS — R221 Localized swelling, mass and lump, neck: Secondary | ICD-10-CM | POA: Diagnosis not present

## 2023-09-07 MED ORDER — AMOXICILLIN-POT CLAVULANATE 500-125 MG PO TABS: 1.0000 | ORAL_TABLET | Freq: Three times a day (TID) | ORAL | 0 refills | Status: DC

## 2023-09-07 NOTE — Discharge Instructions (Addendum)
 Begin taking Augmentin as prescribed.  Sour candy for the next few days.  Apply warm compresses as frequently as possible for the next few days.  Follow-up with [facility] if not improving in the next few days.  Their contact information has been provided in this discharge summary for you to call and make these arrangements.  Return to the ER if symptoms significantly worsen or change.

## 2023-09-11 ENCOUNTER — Ambulatory Visit: Payer: Self-pay | Admitting: Cardiology

## 2023-09-16 NOTE — Telephone Encounter (Signed)
 Error

## 2023-09-29 ENCOUNTER — Other Ambulatory Visit: Payer: Self-pay | Admitting: Cardiology

## 2023-09-29 NOTE — Telephone Encounter (Signed)
 Prescription refill request for Eliquis received. Indication: afib  Last office visit:Ganji 09/02/2023 Scr: 0.55, 09/06/2023 Age: 89 yo  Weight: 54.4 kg   Refill sent.

## 2023-10-11 ENCOUNTER — Encounter (HOSPITAL_BASED_OUTPATIENT_CLINIC_OR_DEPARTMENT_OTHER): Payer: Self-pay | Admitting: Emergency Medicine

## 2023-10-11 ENCOUNTER — Emergency Department (HOSPITAL_BASED_OUTPATIENT_CLINIC_OR_DEPARTMENT_OTHER)
Admission: EM | Admit: 2023-10-11 | Discharge: 2023-10-11 | Disposition: A | Discharge status: Home / Self Care | Attending: Emergency Medicine | Admitting: Emergency Medicine

## 2023-10-11 ENCOUNTER — Emergency Department (HOSPITAL_BASED_OUTPATIENT_CLINIC_OR_DEPARTMENT_OTHER)

## 2023-10-11 DIAGNOSIS — N390 Urinary tract infection, site not specified: Secondary | ICD-10-CM | POA: Insufficient documentation

## 2023-10-11 DIAGNOSIS — Z7901 Long term (current) use of anticoagulants: Secondary | ICD-10-CM | POA: Insufficient documentation

## 2023-10-11 DIAGNOSIS — R1084 Generalized abdominal pain: Secondary | ICD-10-CM | POA: Diagnosis present

## 2023-10-11 DIAGNOSIS — K429 Umbilical hernia without obstruction or gangrene: Secondary | ICD-10-CM | POA: Diagnosis not present

## 2023-10-11 LAB — COMPREHENSIVE METABOLIC PANEL WITH GFR
ALT: 11 U/L (ref 0–44)
AST: 21 U/L (ref 15–41)
Albumin: 3.4 g/dL — ABNORMAL LOW (ref 3.5–5.0)
Alkaline Phosphatase: 45 U/L (ref 38–126)
Anion gap: 7 (ref 5–15)
BUN: 27 mg/dL — ABNORMAL HIGH (ref 8–23)
CO2: 25 mmol/L (ref 22–32)
Calcium: 9.1 mg/dL (ref 8.9–10.3)
Chloride: 107 mmol/L (ref 98–111)
Creatinine, Ser: 0.48 mg/dL (ref 0.44–1.00)
GFR, Estimated: >60 mL/min (ref >60)
Glucose, Bld: 99 mg/dL (ref 70–99)
Potassium: 4.2 mmol/L (ref 3.5–5.1)
Sodium: 139 mmol/L (ref 135–145)
Total Bilirubin: 0.2 mg/dL (ref 0.0–1.2)
Total Protein: 6.6 g/dL (ref 6.5–8.1)

## 2023-10-11 LAB — CBC WITH DIFFERENTIAL/PLATELET
Abs Immature Granulocytes: 0.01 10*3/uL (ref 0.00–0.07)
Basophils Absolute: 0 10*3/uL (ref 0.0–0.1)
Basophils Relative: 1 %
Eosinophils Absolute: 0 10*3/uL (ref 0.0–0.5)
Eosinophils Relative: 1 %
HCT: 43.4 % (ref 36.0–46.0)
Hemoglobin: 13.9 g/dL (ref 12.0–15.0)
Immature Granulocytes: 0 %
Lymphocytes Relative: 11 %
Lymphs Abs: 0.6 10*3/uL — ABNORMAL LOW (ref 0.7–4.0)
MCH: 28.4 pg (ref 26.0–34.0)
MCHC: 32 g/dL (ref 30.0–36.0)
MCV: 88.6 fL (ref 80.0–100.0)
Monocytes Absolute: 0.3 10*3/uL (ref 0.1–1.0)
Monocytes Relative: 5 %
Neutro Abs: 4.7 10*3/uL (ref 1.7–7.7)
Neutrophils Relative %: 82 %
Platelets: 260 10*3/uL (ref 150–400)
RBC: 4.9 MIL/uL (ref 3.87–5.11)
RDW: 15.9 % — ABNORMAL HIGH (ref 11.5–15.5)
WBC: 5.7 10*3/uL (ref 4.0–10.5)
nRBC: 0 % (ref 0.0–0.2)

## 2023-10-11 LAB — URINALYSIS, W/ REFLEX TO CULTURE (INFECTION SUSPECTED)
Bilirubin Urine: NEGATIVE
Glucose, UA: NEGATIVE mg/dL
Ketones, ur: NEGATIVE mg/dL
Nitrite: NEGATIVE
Protein, ur: NEGATIVE mg/dL
Specific Gravity, Urine: 1.015 (ref 1.005–1.030)
pH: 6.5 (ref 5.0–8.0)

## 2023-10-11 LAB — LIPASE, BLOOD: Lipase: 29 U/L (ref 11–51)

## 2023-10-11 MED ORDER — IOHEXOL 300 MG/ML  SOLN
75.0000 mL | Freq: Once | INTRAMUSCULAR | Status: AC | PRN
  Administered 2023-10-11: 75 mL via INTRAVENOUS

## 2023-10-11 MED ORDER — MORPHINE SULFATE (PF) 2 MG/ML IV SOLN
2.0000 mg | Freq: Once | INTRAVENOUS | Status: AC
  Administered 2023-10-11: 2 mg via INTRAVENOUS
  Filled 2023-10-11: qty 1

## 2023-10-11 MED ORDER — CEPHALEXIN 500 MG PO CAPS: 500.0000 mg | ORAL_CAPSULE | Freq: Two times a day (BID) | ORAL | 0 refills | Status: AC

## 2023-10-11 MED ORDER — SODIUM CHLORIDE 0.9 % IV BOLUS
500.0000 mL | Freq: Once | INTRAVENOUS | Status: AC
  Administered 2023-10-11: 500 mL via INTRAVENOUS

## 2023-10-11 NOTE — Discharge Instructions (Addendum)
 If you develop worsening, continued, or recurrent abdominal pain, uncontrolled vomiting, fever, chest or back pain, or any other new/concerning symptoms then return to the ER for evaluation.

## 2023-10-11 NOTE — ED Triage Notes (Signed)
 Pt sts she "felt a hernia pop" during the night, c/o umbilical pain and swelling since; also reports dysuria

## 2023-10-11 NOTE — ED Notes (Signed)
 Patient transported to CT

## 2023-10-11 NOTE — ED Notes (Signed)
 Discharge instructions reviewed with patient and daughter. Patient verbalizes understanding, no further questions at this time. Medications/prescriptions and follow up information provided. No acute distress noted at time of departure.

## 2023-10-11 NOTE — ED Provider Notes (Signed)
 [place] [facility] Provider Note   CSN: [id number] Arrival date & time: 10/11/23  1133     History  Chief Complaint  Patient presents with   Abdominal Pain    Tina Webb is a 89 y.o. female.  HPI 89 year old female with history of MS presents with abdominal pain and swelling from a hernia.  She states that in the middle the night/early this morning she developed abdominal distention through her umbilical hernia, abdominal pain, nausea and dysuria.  She has a history of frequent UTIs.  She states that the abdominal pain is diffuse though the hernia/swelling is at her umbilicus.  No chest pain, shortness of breath, cough, fever.  Home Medications Prior to Admission medications   Medication Sig Start Date End Date Taking? Authorizing Provider  cephALEXin (KEFLEX) 500 MG capsule Take 1 capsule (500 mg total) by mouth 2 (two) times daily for 7 days. 10/11/23 10/18/23 Yes Pricilla Loveless, MD  amiodarone (PACERONE) 200 MG tablet Take 0.5 tablets (100 mg total) by mouth every other day. 06/24/23   Yates Decamp, MD  amoxicillin-clavulanate (AUGMENTIN) 500-125 MG tablet Take 1 tablet by mouth every 8 (eight) hours. 09/07/23   Geoffery Lyons, MD  AMPYRA 10 MG TB12 Take 1 tablet (10 mg total) by mouth in the morning and at bedtime. 09/03/23   Sater, Pearletha Furl, MD  apixaban (ELIQUIS) 2.5 MG TABS tablet TAKE 1 TABLET TWICE DAILY 09/29/23   Yates Decamp, MD  baclofen (LIORESAL) 10 MG tablet TAKE 1/2 TABLET TWICE DAILY 08/20/23   Sater, Pearletha Furl, MD  Cranberry-Vitamin C-Probiotic (AZO CRANBERRY PO) Take 1 tablet by mouth daily.    [provider]  Desoximetasone (TOPICORT) 0.25 % ointment Apply 1 Application topically 2 (two) times daily. 12/23/22 12/23/23  [provider]  estradiol (ESTRING) 2 MG vaginal ring Place 2 mg vaginally every 3 (three) months. follow package directions    [provider]  folic acid (FOLVITE) 1 MG tablet Take 1 mg  by mouth daily.    [provider]  gabapentin (NEURONTIN) 300 MG capsule Take 300-600 mg by mouth 3 (three) times daily.    [provider]  levothyroxine (SYNTHROID) 75 MCG tablet Take 75 mcg by mouth daily before breakfast. 12/23/22   [provider]  liothyronine (CYTOMEL) 5 MCG tablet Take 10 mcg by mouth daily.    [provider]  MELATONIN PO Take by mouth.    [provider]  methenamine (HIPREX) 1 g tablet Take 1 g by mouth daily.    [provider]  methotrexate (RHEUMATREX) 2.5 MG tablet Take 15 mg by mouth once a week. Caution:Chemotherapy. Protect from light. 6 tablets weekley    [provider]  modafinil (PROVIGIL) 200 MG tablet Take 1 tablet (200 mg total) by mouth daily. 07/10/23   Sater, Pearletha Furl, MD  nitrofurantoin (MACRODANTIN) 50 MG capsule Take 50 mg by mouth daily.    [provider]  pantoprazole (PROTONIX) 40 MG tablet Take 40 mg by mouth daily. 04/01/22   [provider]  polyethylene glycol (MIRALAX / GLYCOLAX) 17 g packet Take 17 g by mouth daily as needed for mild constipation.    [provider]  Probiotic Product (PROBIOTIC PO) Take 2 tablets by mouth daily.    [provider]  solifenacin (VESICARE) 5 MG tablet Take 1 tablet by mouth daily. 02/21/22   [provider]  VITAMIN D PO Take 4,000 Units by mouth daily.  [provider]      Allergies    Patient has no known allergies.    Review of Systems   Review of Systems  Constitutional:  Negative for fever.  Gastrointestinal:  Positive for abdominal distention, abdominal pain and nausea. Negative for vomiting.  Genitourinary:  Positive for dysuria.    Physical Exam Updated Vital Signs BP 111/65   Pulse 62   Temp (!) 97.2 F (36.2 C)   Resp 19   Ht 5\' 2"  (1.575 m)   Wt 54.4 kg   SpO2 100%   BMI 21.94 kg/m  Physical Exam Vitals and nursing note reviewed.  Constitutional:       Appearance: She is well-developed.  HENT:     Head: Normocephalic and atraumatic.  Cardiovascular:     Rate and Rhythm: Normal rate and regular rhythm.     Heart sounds: Normal heart sounds.  Pulmonary:     Effort: Pulmonary effort is normal.     Breath sounds: Normal breath sounds.  Abdominal:     Palpations: Abdomen is soft.     Tenderness: There is generalized abdominal tenderness.     Hernia: A hernia is present. Hernia is present in the umbilical area (i was able to reduce this hernia. no erythema to the skin).  Skin:    General: Skin is warm and dry.  Neurological:     Mental Status: She is alert.     ED Results / Procedures / Treatments   Labs (all labs ordered are listed, but only abnormal results are displayed) Labs Reviewed  COMPREHENSIVE METABOLIC PANEL WITH GFR - Abnormal; Notable for the following components:      Result Value   BUN 27 (*)    Albumin 3.4 (*)    All other components within normal limits  CBC WITH DIFFERENTIAL/PLATELET - Abnormal; Notable for the following components:   RDW 15.9 (*)    Lymphs Abs 0.6 (*)    All other components within normal limits  URINALYSIS, W/ REFLEX TO CULTURE (INFECTION SUSPECTED) - Abnormal; Notable for the following components:   APPearance HAZY (*)    Hgb urine dipstick SMALL (*)    Leukocytes,Ua MODERATE (*)    Bacteria, UA FEW (*)    All other components within normal limits  URINE CULTURE  LIPASE, BLOOD    EKG EKG Interpretation Date/Time:  Saturday October 11 2023 12:26:30 EDT Ventricular Rate:  64 PR Interval:  189 QRS Duration:  152 QT Interval:  497 QTC Calculation: 513 R Axis:   -17  Text Interpretation: Sinus rhythm Left bundle branch block no significant change since Feb 2025 Confirmed by Pricilla Loveless [phone]) on 10/11/2023 12:36:20 PM  Radiology CT ABDOMEN PELVIS W CONTRAST Result Date: 10/11/2023 CLINICAL DATA:  Abdominal pain. EXAM: CT ABDOMEN AND PELVIS WITH CONTRAST TECHNIQUE: Multidetector CT  imaging of the abdomen and pelvis was performed using the standard protocol following bolus administration of intravenous contrast. RADIATION DOSE REDUCTION: This exam was performed according to the departmental dose-optimization program which includes automated exposure control, adjustment of the mA and/or kV according to patient size and/or use of iterative reconstruction technique. CONTRAST:  75mL OMNIPAQUE IOHEXOL 300 MG/ML  SOLN COMPARISON:  CT abdomen pelvis dated 12/27/2022. FINDINGS: Lower chest: Partially visualized small bilateral pleural effusions, left greater than right. There is associated partial compressive atelectasis of the adjacent lungs. No intra-abdominal free air or free fluid. Hepatobiliary: Subcentimeter hepatic hypodense lesion is too small to characterize but present on the prior CT,  possibly a cyst or hemangioma. There is mild biliary dilatation versus mild periportal edema. There is layering sludge and small stones in the gallbladder. No pericholecystic fluid or evidence of acute cholecystitis by CT. Pancreas: Unremarkable. No pancreatic ductal dilatation or surrounding inflammatory changes. Spleen: Normal in size without focal abnormality. Adrenals/Urinary Tract: The renal glands are unremarkable. There is no hydronephrosis on either side. There is symmetric enhancement and excretion of contrast by both kidneys. There is severe trabeculation of the urinary bladder suggestive of chronic bladder dysfunction. Stomach/Bowel: There is moderate stool throughout the colon. There is no bowel obstruction or active inflammation. The appendix is not visualized with certainty. No inflammatory changes identified in the right lower quadrant. Vascular/Lymphatic: Mild aortoiliac atherosclerotic disease. The IVC is unremarkable. No portal venous gas. There is no adenopathy. Reproductive: The uterus is grossly unremarkable. There is a 2.2 cm right ovarian cyst. No imaging follow-up. No suspicious adnexal  masses. A pessary is noted. Other: Small fat containing left paraumbilical hernia. Musculoskeletal: Osteopenia with degenerative changes of spine. No acute osseous pathology. IMPRESSION: 1. No acute intra-abdominal or pelvic pathology. 2. Partially visualized small bilateral pleural effusions, left greater than right. 3. Cholelithiasis. 4. Severe trabeculation of the urinary bladder suggestive of chronic bladder dysfunction. Electronically Signed   By: Elgie Collard M.D.   On: 10/11/2023 13:51    Procedures Procedures    Medications Ordered in ED Medications  sodium chloride 0.9 % bolus 500 mL (500 mLs Intravenous New Bag/Given 10/11/23 1301)  morphine (PF) 2 MG/ML injection 2 mg (2 mg Intravenous Given 10/11/23 1234)  iohexol (OMNIPAQUE) 300 MG/ML solution 75 mL (75 mLs Intravenous Contrast Given 10/11/23 1330)    ED Course/ Medical Decision Making/ A&P                                 Medical Decision Making Amount and/or Complexity of Data Reviewed Labs: ordered.    Details: UTI Radiology: ordered and independent interpretation performed.    Details: No bowel obstruction ECG/medicine tests: ordered and independent interpretation performed.    Details: Chronic left bundle branch block  Risk Prescription drug management.   Patient presents with abdominal pain.  I was able to reduce her hernia which did have bowel present and has since remained flat after reduction.  Her abdominal pain has also gone away.  She does have a UTI she was suspecting and so she will be given oral antibiotics for this.  Her labs are otherwise reassuring.  She feels well and I think is stable for discharge home to follow-up with her PCP.  Will give return precautions.        Final Clinical Impression(s) / ED Diagnoses Final diagnoses:  Umbilical hernia without obstruction and without gangrene  Acute urinary tract infection    Rx / DC Orders ED Discharge Orders          Ordered    cephALEXin  (KEFLEX) 500 MG capsule  2 times daily        10/11/23 1502              Pricilla Loveless, MD 10/11/23 1512

## 2023-10-11 NOTE — ED Notes (Signed)
 Pt in CT.

## 2023-10-13 LAB — URINE CULTURE

## 2023-10-29 ENCOUNTER — Ambulatory Visit: Payer: Medicare Other | Admitting: Orthopedic Surgery

## 2023-11-05 ENCOUNTER — Encounter: Payer: Self-pay | Admitting: Orthopedic Surgery

## 2023-11-05 ENCOUNTER — Other Ambulatory Visit: Payer: Self-pay

## 2023-11-05 ENCOUNTER — Ambulatory Visit (INDEPENDENT_AMBULATORY_CARE_PROVIDER_SITE_OTHER): Payer: Medicare Other | Admitting: Orthopedic Surgery

## 2023-11-05 DIAGNOSIS — M19012 Primary osteoarthritis, left shoulder: Secondary | ICD-10-CM | POA: Diagnosis not present

## 2023-11-05 MED ORDER — METHYLPREDNISOLONE ACETATE 40 MG/ML IJ SUSP
40.0000 mg | INTRAMUSCULAR | Status: DC | PRN
  Administered 2023-11-05: 40 mg via INTRA_ARTICULAR

## 2023-11-05 MED ORDER — BUPIVACAINE HCL 0.5 % IJ SOLN
9.0000 mL | INTRAMUSCULAR | Status: AC | PRN
  Administered 2023-11-05: 9 mL via INTRA_ARTICULAR

## 2023-11-05 MED ORDER — LIDOCAINE HCL 1 % IJ SOLN
5.0000 mL | INTRAMUSCULAR | Status: AC | PRN
  Administered 2023-11-05: 5 mL

## 2023-11-05 NOTE — Progress Notes (Addendum)
 Office Visit Note   Patient: Tina Webb           Date of Birth: 1934/07/08           MRN: [id number] Visit Date: 11/05/2023 Requested by: No referring provider defined for this encounter. PCP: Hoover Luz, MD  Subjective: Chief Complaint  Patient presents with   Left Shoulder - Pain    HPI: Tina Webb is a 89 y.o. female who presents to the office reporting continued left shoulder pain.  Last injection 07/30/2023 which has helped.  Pain has recurred to some degree.  She would like another cortisone injection.  She is a nonoperative candidate in terms of shoulder replacement.  Does take Advil and gabapentin .  She uses a motorized wheelchair with her right hand primarily.                ROS: All systems reviewed are negative as they relate to the chief complaint within the history of present illness.  Patient denies fevers or chills.  Assessment & Plan: Visit Diagnoses:  1. Arthritis of left shoulder region     Plan: Impression is severe left shoulder glenohumeral arthritis.  Trace effusion present in the joint today.  Under ultrasound guidance and injection is performed.  Will see how she does with that intervention.  Follow-up in 4 to 6 months for consideration of repeat injection depending on clinical response to this injection.  She asked about steroid patches today but I have very little experience with those.  In terms of being beneficial to the joint I think a direct intra-articular injection has proven to be the best tool at this time to he send of the pain associated with her shoulder deformity.  Follow-Up Instructions: No follow-ups on file.   Orders:  Orders Placed This Encounter  Procedures   US  Guided Needle Placement - No Linked Charges   No orders of the defined types were placed in this encounter.     Procedures: Large Joint Inj: L glenohumeral on 11/05/2023 6:41 PM Indications: diagnostic evaluation and pain Details: 22 G 3.5 in needle,  ultrasound-guided posterior approach  Arthrogram: No  Medications: 9 mL bupivacaine  0.5 %; 5 mL lidocaine  1 % Outcome: tolerated well, no immediate complications Procedure, treatment alternatives, risks and benefits explained, specific risks discussed. Consent was given by the patient. Immediately prior to procedure a time out was called to verify the correct patient, procedure, equipment, support staff and site/side marked as required. Patient was prepped and draped in the usual sterile fashion.     Triamcinolone  injected  Clinical Data: No additional findings.  Objective: Vital Signs: There were no vitals taken for this visit.  Physical Exam:  Constitutional: Patient appears well-developed HEENT:  Head: Normocephalic Eyes:EOM are normal Neck: Normal range of motion Cardiovascular: Normal rate Pulmonary/chest: Effort normal Neurologic: Patient is alert Skin: Skin is warm Psychiatric: Patient has normal mood and affect  Ortho Exam: Ortho exam demonstrates expected crepitus with passive range of motion.  Deltoid does fire.  Motor or sensory function in the hand is intact.  Radial pulses intact.  Specialty Comments:  No specialty comments available.  Imaging: No results found.   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   Wheelchair dependence 12/24/2022   Essential tremor 08/23/2021   Primary osteoarthritis of left shoulder 10/05/2020   Chronic left shoulder pain 08/10/2020   Chronic night sweats 08/09/2020   Multiple sclerosis  (HCC) 11/02/2019   Gait disorder 11/02/2019   Weakness 11/02/2019   Urge incontinence of urine 11/02/2019   Foot-drop 10/06/2019   Senile osteoporosis 10/06/2019   Contracted bladder 06/17/2016   Injury of tendon of rotator cuff 04/25/2016   Neurogenic bladder 04/25/2016   Hypothyroidism 08/16/2010   Hypertrophic cardiomyopathy (HCC) 07/08/2010   Vitamin D deficiency 11/13/2009   Gastroesophageal reflux disease 08/14/2009   Past Medical History:  Diagnosis Date   Anxiety    High cholesterol    Hypertrophic cardiomyopathy (HCC)    Multiple sclerosis (HCC)    Paroxysmal A-fib (HCC)    Rotator cuff tear    left    Family History  Problem Relation Age of Onset   Heart attack Mother    Transient ischemic attack Mother    Arthritis Mother    Heart Problems Father     Past Surgical History:  Procedure Laterality Date   BLADDER SURGERY  2018   STEM placement    IR THORACENTESIS ASP PLEURAL SPACE W/IMG GUIDE  01/01/2023   ULNAR NERVE REPAIR Right 2010   Social History   Occupational History   Occupation: Retired  Tobacco Use   Smoking status: Never   Smokeless tobacco: Never  Vaping Use   Vaping status: Never Used  Substance and Sexual Activity   Alcohol use: Yes    Comment: 2 drinks per week   Drug use: Never   Sexual activity: Not Currently

## 2023-12-22 ENCOUNTER — Emergency Department (HOSPITAL_BASED_OUTPATIENT_CLINIC_OR_DEPARTMENT_OTHER)
Admission: EM | Admit: 2023-12-22 | Discharge: 2023-12-22 | Disposition: A | Discharge status: Home / Self Care | Attending: Emergency Medicine | Admitting: Emergency Medicine

## 2023-12-22 ENCOUNTER — Other Ambulatory Visit: Payer: Self-pay

## 2023-12-22 ENCOUNTER — Encounter (HOSPITAL_BASED_OUTPATIENT_CLINIC_OR_DEPARTMENT_OTHER): Payer: Self-pay | Admitting: Emergency Medicine

## 2023-12-22 ENCOUNTER — Emergency Department (HOSPITAL_BASED_OUTPATIENT_CLINIC_OR_DEPARTMENT_OTHER)

## 2023-12-22 DIAGNOSIS — N3000 Acute cystitis without hematuria: Secondary | ICD-10-CM | POA: Diagnosis not present

## 2023-12-22 DIAGNOSIS — R109 Unspecified abdominal pain: Secondary | ICD-10-CM | POA: Diagnosis present

## 2023-12-22 DIAGNOSIS — R1084 Generalized abdominal pain: Secondary | ICD-10-CM

## 2023-12-22 DIAGNOSIS — K429 Umbilical hernia without obstruction or gangrene: Secondary | ICD-10-CM | POA: Insufficient documentation

## 2023-12-22 LAB — COMPREHENSIVE METABOLIC PANEL WITH GFR
ALT: 11 U/L (ref 0–44)
AST: 24 U/L (ref 15–41)
Albumin: 4 g/dL (ref 3.5–5.0)
Alkaline Phosphatase: 49 U/L (ref 38–126)
Anion gap: 9 (ref 5–15)
BUN: 26 mg/dL — ABNORMAL HIGH (ref 8–23)
CO2: 27 mmol/L (ref 22–32)
Calcium: 9.4 mg/dL (ref 8.9–10.3)
Chloride: 107 mmol/L (ref 98–111)
Creatinine, Ser: 0.56 mg/dL (ref 0.44–1.00)
GFR, Estimated: >60 mL/min
Glucose, Bld: 89 mg/dL (ref 70–99)
Potassium: 4.4 mmol/L (ref 3.5–5.1)
Sodium: 142 mmol/L (ref 135–145)
Total Bilirubin: 0.2 mg/dL (ref 0.0–1.2)
Total Protein: 6.3 g/dL — ABNORMAL LOW (ref 6.5–8.1)

## 2023-12-22 LAB — URINALYSIS, ROUTINE W REFLEX MICROSCOPIC
Bilirubin Urine: NEGATIVE
Glucose, UA: NEGATIVE mg/dL
Ketones, ur: NEGATIVE mg/dL
Nitrite: NEGATIVE
Protein, ur: NEGATIVE mg/dL
Specific Gravity, Urine: 1.015 (ref 1.005–1.030)
pH: 6 (ref 5.0–8.0)

## 2023-12-22 LAB — URINALYSIS, MICROSCOPIC (REFLEX): WBC, UA: >50 WBC/hpf (ref 0–5)

## 2023-12-22 LAB — CBC
HCT: 42.3 % (ref 36.0–46.0)
Hemoglobin: 13.5 g/dL (ref 12.0–15.0)
MCH: 29.1 pg (ref 26.0–34.0)
MCHC: 31.9 g/dL (ref 30.0–36.0)
MCV: 91.2 fL (ref 80.0–100.0)
Platelets: 217 10*3/uL (ref 150–400)
RBC: 4.64 MIL/uL (ref 3.87–5.11)
RDW: 16.8 % — ABNORMAL HIGH (ref 11.5–15.5)
WBC: 4.2 10*3/uL (ref 4.0–10.5)
nRBC: 0 % (ref 0.0–0.2)

## 2023-12-22 LAB — LIPASE, BLOOD: Lipase: 48 U/L (ref 11–51)

## 2023-12-22 MED ORDER — SENNOSIDES-DOCUSATE SODIUM 8.6-50 MG PO TABS: 1.0000 | ORAL_TABLET | Freq: Every evening | ORAL | 0 refills | Status: DC | PRN

## 2023-12-22 MED ORDER — CEPHALEXIN 500 MG PO CAPS: 500.0000 mg | ORAL_CAPSULE | Freq: Two times a day (BID) | ORAL | 0 refills | Status: AC

## 2023-12-22 MED ORDER — MORPHINE SULFATE (PF) 2 MG/ML IV SOLN
2.0000 mg | Freq: Once | INTRAVENOUS | Status: AC
  Administered 2023-12-22: 2 mg via INTRAVENOUS
  Filled 2023-12-22: qty 1

## 2023-12-22 MED ORDER — IOHEXOL 300 MG/ML  SOLN
100.0000 mL | Freq: Once | INTRAMUSCULAR | Status: AC | PRN
  Administered 2023-12-22: 100 mL via INTRAVENOUS

## 2023-12-22 MED ORDER — SODIUM CHLORIDE 0.9 % IV BOLUS
500.0000 mL | Freq: Once | INTRAVENOUS | Status: AC
  Administered 2023-12-22: 500 mL via INTRAVENOUS

## 2023-12-22 MED ORDER — ONDANSETRON HCL 4 MG/2ML IJ SOLN
4.0000 mg | Freq: Once | INTRAMUSCULAR | Status: AC
  Administered 2023-12-22: 4 mg via INTRAVENOUS
  Filled 2023-12-22: qty 2

## 2023-12-22 NOTE — Discharge Instructions (Addendum)
 We were able to reduce your hernia. Please continue your home medications and follow with your PCP in the coming week as needed.  I am starting you on an antibiotic for the next 7 days with some bacteria in the urine.  We will call if your culture shows you need to change antibiotics.

## 2023-12-22 NOTE — ED Triage Notes (Signed)
 Pt POV in personal wheelchair- c/o hernia popped out reports hx of periumbilical hernia since this AM, worsening.   Also reports pain radiates across stomach.  Denies n/v/d.

## 2023-12-22 NOTE — ED Provider Notes (Signed)
 Emergency Department Provider Note   I have reviewed the triage vital signs and the nursing notes.   HISTORY  Chief Complaint Abdominal Pain   HPI Tina Webb is a 89 y.o. female with past history reviewed below including known periumbilical hernia presents to the emergency department with worsening pain after her hernia popped out.  She is typically able to push it back in easily but had significant discomfort with her husband attempting to do so.  No vomiting.  She continues to passed flatus and stool.  In addition to her central abdominal pain she has pain radiating to the bilateral flanks as well.  No chest pain or shortness of breath.  Past Medical History:  Diagnosis Date   Anxiety    High cholesterol    Hypertrophic cardiomyopathy (HCC)    Multiple sclerosis (HCC)    Paroxysmal A-fib (HCC)    Rotator cuff tear    left    Review of Systems  Constitutional: No fever/chills Cardiovascular: Denies chest pain. Respiratory: Denies shortness of breath. Gastrointestinal: Positive abdominal pain.  No nausea, no vomiting.  No diarrhea.  No constipation. Skin: Negative for rash. Neurological: Negative for headaches.  ____________________________________________   PHYSICAL EXAM:  VITAL SIGNS: ED Triage Vitals  Encounter Vitals Group     BP 12/22/23 1203 (!) 174/62     Pulse Rate 12/22/23 1203 63     Resp 12/22/23 1145 (!) 24     Temp 12/22/23 1203 98.1 F (36.7 C)     Temp src --      SpO2 12/22/23 1203 99 %     Weight 12/22/23 1133 110 lb (49.9 kg)     Height 12/22/23 1133 5' 2 (1.575 m)   Constitutional: Alert and oriented. Well appearing and in no acute distress. Eyes: Conjunctivae are normal.  Head: Atraumatic. Nose: No congestion/rhinnorhea. Mouth/Throat: Mucous membranes are moist.  Neck: No stridor.   Cardiovascular: Normal rate, regular rhythm. Good peripheral circulation. Grossly normal heart sounds.   Respiratory: Normal respiratory effort.   No retractions. Lungs CTAB. Gastrointestinal: Soft with palpable periumbilical hernia which is soft.  No peritonitis. No distention.  Musculoskeletal: No lower extremity tenderness nor edema. No gross deformities of extremities. Neurologic:  Normal speech and language. No gross focal neurologic deficits are appreciated.  Skin:  Skin is warm, dry and intact. No rash noted.  ____________________________________________   LABS (all labs ordered are listed, but only abnormal results are displayed)  Labs Reviewed  COMPREHENSIVE METABOLIC PANEL WITH GFR - Abnormal; Notable for the following components:      Result Value   BUN 26 (*)    Total Protein 6.3 (*)    All other components within normal limits  CBC - Abnormal; Notable for the following components:   RDW 16.8 (*)    All other components within normal limits  URINALYSIS, ROUTINE W REFLEX MICROSCOPIC - Abnormal; Notable for the following components:   APPearance CLOUDY (*)    Hgb urine dipstick MODERATE (*)    Leukocytes,Ua LARGE (*)    All other components within normal limits  URINALYSIS, MICROSCOPIC (REFLEX) - Abnormal; Notable for the following components:   Bacteria, UA MANY (*)    All other components within normal limits  URINE CULTURE  LIPASE, BLOOD   ____________________________________________  EKG   EKG Interpretation Date/Time:  Monday December 22 2023 11:40:56 EDT Ventricular Rate:  68 PR Interval:  191 QRS Duration:  155 QT Interval:  532 QTC Calculation: 566 R Axis:  40  Text Interpretation: Sinus rhythm Left bundle branch block Confirmed by Abby Hocking [id number]) on 12/22/2023 11:58:12 AM        ____________________________________________  RADIOLOGY  CT ABDOMEN PELVIS W CONTRAST Result Date: 12/22/2023 CLINICAL DATA:  Abdominal pain acute nonlocalized EXAM: CT ABDOMEN AND PELVIS WITHOUT CONTRAST TECHNIQUE: Multidetector CT imaging of the abdomen and pelvis was performed following the standard protocol  without IV contrast. RADIATION DOSE REDUCTION: This exam was performed according to the departmental dose-optimization program which includes automated exposure control, adjustment of the mA and/or kV according to patient size and/or use of iterative reconstruction technique. COMPARISON:  October 11, 2023 FINDINGS: Lower chest: Comparison with prior examination persistent ill-defined bilateral basilar infiltrates and atelectasis left more than right with a small left pleural effusion. Hepatobiliary: Chronic cholelithiasis unchanged since prior examination. No gallbladder inflammatory changes. No parenchymal hepatic lesions or biliary dilatation. Pancreas: Pancreas normal size. No masses calcifications or inflammatory changes. Spleen: Spleen normal size.  No masses. Adrenals/Urinary Tract: Adrenal glands are normal size. Follow-up recommended. Kidneys are normal. No masses calcifications or hydronephrosis Stomach/Bowel: No small or large bowel obstruction or inflammatory changes. Moderate amount of residual fecal material throughout the colon without obstruction or constipation. Vascular/Lymphatic: No significant vascular findings are present. No enlarged abdominal or pelvic lymph nodes. Reproductive: Uterus and bilateral adnexa are unremarkable. Distended bladder with multiple diverticula and pseudo diverticula unchanged since prior examination. Other: No change in the anterior abdominal wall umbilical hernia containing fat only comparison is made with prior examination. No new hernias. Musculoskeletal: Visualized portion of the thoracolumbar spine and pelvic structures grossly unremarkable without evidence of fracture bony abnormalities or soft tissue masses. Comparison with prior examinations demonstrates no change in the left gluteal 4.5 x 4.6 cm lipoma IMPRESSION: *No acute intra-abdominal or pelvic pathology. *Chronic cholelithiasis. *Distended bladder with multiple diverticula and pseudo diverticula unchanged  since prior examination. *No change in the anterior abdominal wall umbilical hernia containing fat only. *Persistent ill-defined bilateral basilar infiltrates and atelectasis left more than right with a small left pleural effusion. Electronically Signed   By: Fredrich Jefferson M.D.   On: 12/22/2023 14:14    ____________________________________________   PROCEDURES  Procedure(s) performed:   Hernia reduction  Date/Time: 12/22/2023 2:01 PM  Performed by: Roberts Ching, MD Authorized by: Roberts Ching, MD  Consent: Verbal consent obtained Risks and benefits: risks, benefits and alternatives were discussed Consent given by: patient Required items: required blood products, implants, devices, and special equipment available Patient identity confirmed: verbally with patient Local anesthesia used: no  Anesthesia: Local anesthesia used: no  Sedation: Patient sedated: no  Patient tolerance: patient tolerated the procedure well with no immediate complications Comments: I was able to palpate a soft, periumbilical hernia.  I applied firm, consistent pressure with the patient lying supine.  I was able to feel the hernia reduce. Patient with minimal discomfort during procedure.     ____________________________________________   INITIAL IMPRESSION / ASSESSMENT AND PLAN / ED COURSE  Pertinent labs & imaging results that were available during my care of the patient were reviewed by me and considered in my medical decision making (see chart for details).   This patient is Presenting for Evaluation of abdominal pain, which does require a range of treatment options, and is a complaint that involves a high risk of morbidity and mortality.  The Differential Diagnoses include symptomatic hernia, incarcerated hernia, colitis, volvulus, etc.  Critical Interventions-    Medications  morphine  (PF) 2 MG/ML injection  2 mg (2 mg Intravenous Given 12/22/23 1238)  sodium chloride  0.9 % bolus 500 mL (0 mLs  Intravenous Stopped 12/22/23 1354)  ondansetron  (ZOFRAN ) injection 4 mg (4 mg Intravenous Given 12/22/23 1237)  iohexol  (OMNIPAQUE ) 300 MG/ML solution 100 mL (100 mLs Intravenous Contrast Given 12/22/23 1345)    Reassessment after intervention: pain improved.    I did obtain Additional Historical Information from husband at bedside.    Clinical Laboratory Tests Ordered, included CBC without leukocytosis.  Normal creatinine.  LFTs normal.  Lipase normal.  Radiologic Tests Ordered, included CT abdomen/pelvis. I independently interpreted the images and agree with radiology interpretation.   Medical Decision Making: Summary:  Patient presents emergency department for evaluation of periumbilical abdominal pain radiating to the flanks.  She has a palpable hernia which is known.  I was able to reduce this at bedside without difficulty.  Patient feeling improved after reduction.  Given age, plan for CT abdomen pelvis, labs, low-dose morphine  and reassess.  Reevaluation with update and discussion with patient.  Much more comfortable after hernia reduction.  CT shows no acute findings.  UA with equivocal UTI.  Patient with some vague UTI symptoms.  Plan for Keflex , send for culture, constipation medication as well.  Stable for discharge.   Patient's presentation is most consistent with acute presentation with potential threat to life or bodily function.   Disposition: discharge  ____________________________________________  FINAL CLINICAL IMPRESSION(S) / ED DIAGNOSES  Final diagnoses:  Periumbilical hernia  Generalized abdominal pain  Acute cystitis without hematuria     NEW OUTPATIENT MEDICATIONS STARTED DURING THIS VISIT:  Discharge Medication List as of 12/22/2023  3:01 PM     START taking these medications   Details  cephALEXin  (KEFLEX ) 500 MG capsule Take 1 capsule (500 mg total) by mouth 2 (two) times daily for 7 days., Starting Mon 12/22/2023, Until Mon 12/29/2023, Normal     senna-docusate (SENOKOT-S) 8.6-50 MG tablet Take 1 tablet by mouth at bedtime as needed for mild constipation or moderate constipation., Starting Mon 12/22/2023, Normal        Note:  This document was prepared using Dragon voice recognition software and may include unintentional dictation errors.  Abby Hocking, MD, [facility]    Rayetta Veith, Shereen Dike, MD 12/23/23 [phone]

## 2023-12-23 LAB — URINE CULTURE: Culture: NO GROWTH

## 2024-01-08 ENCOUNTER — Other Ambulatory Visit: Payer: Self-pay

## 2024-01-08 ENCOUNTER — Telehealth: Payer: Self-pay | Admitting: Neurology

## 2024-01-08 NOTE — Telephone Encounter (Signed)
 Pt states [facility] informed her they need a renewal on her gabapentin  (NEURONTIN ) 300 MG capsule they can be called at [phone]

## 2024-01-08 NOTE — Telephone Encounter (Signed)
 Pt last saw Dr. Vear 07/10/23. Next f/u 02/05/24.   We are not the prescribing office for gabapentin . Appears the following provider below last sent in refill to [facility] 09/13/23 #[id number]; Eldora Public, FNP.   Per last note Dr. Vear recommended: Reduce gabapentin  to once daily at night.  I called pt back at [phone]. She will reach out to prescribing MD to further discuss refill request.

## 2024-01-15 ENCOUNTER — Institutional Professional Consult (permissible substitution) (INDEPENDENT_AMBULATORY_CARE_PROVIDER_SITE_OTHER): Admitting: Otolaryngology

## 2024-02-04 ENCOUNTER — Encounter: Payer: Self-pay | Admitting: Orthopedic Surgery

## 2024-02-04 ENCOUNTER — Other Ambulatory Visit: Payer: Self-pay

## 2024-02-04 ENCOUNTER — Ambulatory Visit (INDEPENDENT_AMBULATORY_CARE_PROVIDER_SITE_OTHER): Admitting: Orthopedic Surgery

## 2024-02-04 DIAGNOSIS — M19012 Primary osteoarthritis, left shoulder: Secondary | ICD-10-CM

## 2024-02-04 NOTE — Progress Notes (Unsigned)
 Office Visit Note   Patient: Tina Webb           Date of Birth: [birth year]           MRN: [id number] Visit Date: 02/04/2024 Requested by: Feliciano Devoria LABOR, MD [address] [place],  [place] [address] PCP: Feliciano Devoria LABOR, MD  Subjective: Chief Complaint  Patient presents with   Left Shoulder - Pain    Injections done 07/30/23 & 11/05/23    HPI: Tina Webb is a 89 y.o. female who presents to the office reporting left shoulder pain.  She gets episodic injections.  She would like to have an injection today.  Last 1 was slightly over 3 months ago.  She is also having more difficulty with her neck.  She is in a wheelchair.  Has MS..                ROS: All systems reviewed are negative as they relate to the chief complaint within the history of present illness.  Patient denies fevers or chills.  Assessment & Plan: Visit Diagnoses:  1. Arthritis of left shoulder region     Plan: Impression is an operative left shoulder arthritis with significant glenoid erosion.  Deltoid is still functional in her hand and elbow are still functional.  Ultrasound-guided injection performed today.  We also got her a soft collar for her neck because it is really tilting a lot to the left.  4 months return clinical recheck.  Follow-Up Instructions: No follow-ups on file.   Orders:  Orders Placed This Encounter  Procedures   US  Guided Needle Placement - No Linked Charges   No orders of the defined types were placed in this encounter.     Procedures: Large Joint Inj: L glenohumeral on 02/04/2024 5:45 PM Indications: diagnostic evaluation and pain Details: 22 G 3.5 in needle, ultrasound-guided posterior approach  Arthrogram: No  Medications: 9 mL bupivacaine  0.5 %; 5 mL lidocaine  1 %; 40 mg triamcinolone  acetonide 40 MG/ML Outcome: tolerated well, no immediate complications Procedure, treatment alternatives, risks and benefits explained, specific risks discussed. Consent  was given by the patient. Immediately prior to procedure a time out was called to verify the correct patient, procedure, equipment, support staff and site/side marked as required. Patient was prepped and draped in the usual sterile fashion.       Clinical Data: No additional findings.  Objective: Vital Signs: There were no vitals taken for this visit.  Physical Exam:  Constitutional: Patient appears well-developed HEENT:  Head: Normocephalic Eyes:EOM are normal Neck: Normal range of motion Cardiovascular: Normal rate Pulmonary/chest: Effort normal Neurologic: Patient is alert Skin: Skin is warm Psychiatric: Patient has normal mood and affect  Ortho Exam: Ortho exam demonstrates some medial displacement of the humerus relative to the acromion.  Deltoid does fire.  Motor sensory function to the hand is intact.  Patient does have intact elbow flexion and extension.  Radial pulses intact bilaterally.  Specialty Comments:  No specialty comments available.  Imaging: No results found.   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   Wheelchair dependence 12/24/2022   Essential tremor 08/23/2021   Primary osteoarthritis of left shoulder 10/05/2020   Chronic left shoulder pain 08/10/2020   Chronic night sweats 08/09/2020   Multiple sclerosis (HCC) 11/02/2019   Gait disorder 11/02/2019   Weakness 11/02/2019   Urge incontinence of urine 11/02/2019   Foot-drop 10/06/2019   Senile osteoporosis 10/06/2019   Contracted bladder 06/17/2016    Injury of tendon of rotator cuff 04/25/2016   Neurogenic bladder 04/25/2016   Hypothyroidism 08/16/2010   Hypertrophic cardiomyopathy (HCC) 07/08/2010   Vitamin D deficiency 11/13/2009   Gastroesophageal reflux disease 08/14/2009   Past Medical History:  Diagnosis Date   Anxiety    High cholesterol    Hypertrophic cardiomyopathy (HCC)    Multiple sclerosis (HCC)    Paroxysmal A-fib (HCC)    Rotator cuff tear    left    Family History  Problem Relation Age of Onset   Heart attack Mother    Transient ischemic attack Mother    Arthritis Mother    Heart Problems Father     Past Surgical History:  Procedure Laterality Date   BLADDER SURGERY  2018   STEM placement    IR THORACENTESIS ASP PLEURAL SPACE W/IMG GUIDE  01/01/2023   ULNAR NERVE REPAIR Right 2010   Social History   Occupational History   Occupation: Retired  Tobacco Use   Smoking status: Never   Smokeless tobacco: Never  Vaping Use   Vaping status: Never Used  Substance and Sexual Activity   Alcohol use: Yes    Comment: 2 drinks per week   Drug use: Never   Sexual activity: Not Currently

## 2024-02-05 ENCOUNTER — Encounter: Payer: Self-pay | Admitting: Neurology

## 2024-02-05 ENCOUNTER — Ambulatory Visit (INDEPENDENT_AMBULATORY_CARE_PROVIDER_SITE_OTHER): Payer: Medicare Other | Admitting: Neurology

## 2024-02-05 VITALS — BP 100/60 | HR 71 | Ht 62.0 in | Wt 110.0 lb

## 2024-02-05 DIAGNOSIS — Z993 Dependence on wheelchair: Secondary | ICD-10-CM | POA: Diagnosis not present

## 2024-02-05 DIAGNOSIS — R0683 Snoring: Secondary | ICD-10-CM

## 2024-02-05 DIAGNOSIS — R269 Unspecified abnormalities of gait and mobility: Secondary | ICD-10-CM

## 2024-02-05 DIAGNOSIS — G35 Multiple sclerosis: Secondary | ICD-10-CM | POA: Diagnosis not present

## 2024-02-05 DIAGNOSIS — R208 Other disturbances of skin sensation: Secondary | ICD-10-CM

## 2024-02-05 DIAGNOSIS — G801 Spastic diplegic cerebral palsy: Secondary | ICD-10-CM | POA: Diagnosis not present

## 2024-02-05 DIAGNOSIS — M436 Torticollis: Secondary | ICD-10-CM

## 2024-02-05 DIAGNOSIS — N3941 Urge incontinence: Secondary | ICD-10-CM

## 2024-02-05 DIAGNOSIS — G4719 Other hypersomnia: Secondary | ICD-10-CM

## 2024-02-05 MED ORDER — AMPHETAMINE-DEXTROAMPHETAMINE 5 MG PO TABS: 5.0000 mg | ORAL_TABLET | Freq: Every day | ORAL | 0 refills | Status: DC

## 2024-02-05 MED ORDER — MODAFINIL 100 MG PO TABS
ORAL_TABLET | ORAL | 3 refills | Status: DC
Start: 2024-02-05 — End: 2024-03-11

## 2024-02-05 NOTE — Progress Notes (Addendum)
 [facility]  PATIENT: Tina Webb DOB: 1934-06-06  REFERRING DOCTOR OR PCP:  Ozell Hives (PCP); Donnice Lee (neurology) SOURCE: Patient, notes from Dr. Lee, imaging reports, MRI images personally reviewed.  _________________________________   HISTORICAL  CHIEF COMPLAINT:  Chief Complaint  Patient presents with   Follow-up    Pt in room 10. Alone. Here for MS follow up. Pt states she can't hold head up straight, not able to sit up straight. Pt said modafinil  didn't work, caused to may side effects.    HISTORY OF PRESENT ILLNESS:  Tina Webb is a 89 y.o. woman with multiple sclerosis diagnosed in 1984.  Update 02/05/2024 She can't hold her head up straight.  Neck is rotated.    She does have severe scoliosis.   SCM muscle does not protrude.  She is noting that she has more EDS.  She snores but has not been told she has other OSA signs.   Reviewing her med list - she is on gabapentin  300 mg po bid and we discussed stopping the AM dose.   She has never had a sleep study.   Modafinil  helped but she reports cardiology concerned.    She does not have sleep paralysis or cataplexy. Dreams can be vivid.     She feels better after a short nap.    EPWORTH SLEEPINESS SCALE  On a scale of 0 - 3 what is the chance of dozing:  Sitting and Reading:   3 Watching TV:    3 Sitting inactive in a public place: 3 Passenger in car for one hour: 3 Lying down to rest in the afternoon: 3 Sitting and talking to someone: 3 Sitting quietly after lunch:  3 In a car, stopped in traffic:  N/a  Total (out of 24):    21/21 (moderate EDS).   She is wheelchair bound but is able to transfer alone some times or with minimal assistance.  Her arms are fairly strong but have reduced coordination.  She also has essential tremor sometimes makes it difficult for her to do tasks with her hands.  Additionally, she has a torn rotator cuff in the left arm.  Bladder function is  unchanged.  Solifenacin  has helped her urinary incontinence and is better tolerated than Detrol .   She goes to urology.     She is on metoprolol  for her tremor and heart and it helpd.  It likely helped her not  have a rapid AFib (HR was 95).    She sees Dr. Ladona.  She is also on amiodarone .   She has mild fatigue.   Mood is fine.    Cognition is doing well .    MS History: She was diagnosed with MS in 1984 after presenting with numbness in both legs.  She had her husband have recently moved to the [place] area from [place] .  While in [place], she was seen Dr. Donnice Lee for her multiple sclerosis.    She reports that the numbness that presented in 1984 was present in both legs.  She improved over time and did not receive any steroids at that time..  Initially, she was not on any DMT but started Copaxone in the 1990's.   She had progression and over the years was also on Rebif, Tecfidera, Aubagio, Zinbryta and Gilenya.  On over the medications, she continued to have slow worsening of her disability.   She did have some definite motor and visual exacerbations in the 1980's and 1990's.  She had a severe UTI in 2018 and she stopped all DMTs at that time.    She has a bladder stimulator (not functional) and is unable to get an MRI.   Her last MRI was around 2013.  She has worked with a Systems analyst and done physical therapy.  This has helped her gain better ability to transfer.   MRI of the brain and cervical spine from 06/10/2012 was personally reviewed.  The MRI of the cervical spine showed T2 hyperintense foci adjacent to C1-C2, C4 and C7 posteriorly and adjacent to C5-C6 to the left.  Additionally there are degenerative changes at C5-C6 causing mild spinal stenosis and right greater than left foraminal narrowing.  The MRI of the brain showed multiple T2/FLAIR hyperintense foci in the periventricular, juxtacortical and deep white matter of both hemispheres.  Additionally there is a T2  hyperintense focus anteriorly within the pontomedullary junction towards the right.  REVIEW OF SYSTEMS: Constitutional: No fevers, chills, sweats, or change in appetite.  She has fatigue.  Eyes: No visual changes, double vision, eye pain Ear, nose and throat: No hearing loss, ear pain, nasal congestion, sore throat Cardiovascular: No chest pain, palpitations Respiratory:  No shortness of breath at rest or with exertion.   No wheezes GastrointestinaI: No nausea, vomiting, diarrhea, abdominal pain, fecal incontinence Genitourinary: Urinary frequency, urgency and multiple UTIs Musculoskeletal: She often has left shoulder pain.   Integumentary: No rash, pruritus, skin lesions Neurological: as above Psychiatric: No depression at this time.  No anxiety Endocrine: No palpitations, diaphoresis, change in appetite, change in weigh or increased thirst Hematologic/Lymphatic:  No anemia, purpura, petechiae. Allergic/Immunologic: No itchy/runny eyes, nasal congestion, recent allergic reactions, rashes  ALLERGIES: No Known Allergies  HOME MEDICATIONS:  Current Outpatient Medications:    amiodarone  (PACERONE ) 200 MG tablet, Take 0.5 tablets (100 mg total) by mouth every other day., Disp: 24 tablet, Rfl: 2   amphetamine -dextroamphetamine  (ADDERALL) 5 MG tablet, Take 1 tablet (5 mg total) by mouth daily. One po qAm and one po if needed at noon, Disp: 60 tablet, Rfl: 0   AMPYRA  10 MG TB12, Take 1 tablet (10 mg total) by mouth in the morning and at bedtime., Disp: 60 tablet, Rfl: 11   apixaban  (ELIQUIS ) 2.5 MG TABS tablet, TAKE 1 TABLET TWICE DAILY, Disp: 180 tablet, Rfl: 1   baclofen  (LIORESAL ) 10 MG tablet, TAKE 1/2 TABLET TWICE DAILY, Disp: 90 tablet, Rfl: 3   Cranberry-Vitamin C-Probiotic (AZO CRANBERRY PO), Take 1 tablet by mouth daily., Disp: , Rfl:    estradiol (ESTRING) 2 MG vaginal ring, Place 2 mg vaginally every 3 (three) months. follow package directions, Disp: , Rfl:    folic acid (FOLVITE) 1  MG tablet, Take 1 mg by mouth daily., Disp: , Rfl:    gabapentin  (NEURONTIN ) 300 MG capsule, Take 300-600 mg by mouth 3 (three) times daily., Disp: , Rfl:    levothyroxine  (SYNTHROID ) 75 MCG tablet, Take 75 mcg by mouth daily before breakfast., Disp: , Rfl:    liothyronine  (CYTOMEL ) 5 MCG tablet, Take 10 mcg by mouth daily., Disp: , Rfl:    MELATONIN PO, Take by mouth., Disp: , Rfl:    methenamine  (HIPREX) 1 g tablet, Take 1 g by mouth daily., Disp: , Rfl:    methotrexate (RHEUMATREX) 2.5 MG tablet, Take 15 mg by mouth once a week. Caution:Chemotherapy. Protect from light. 6 tablets weekley, Disp: , Rfl:    nitrofurantoin  (MACRODANTIN ) 50 MG capsule, Take 50 mg by mouth daily., Disp: ,  Rfl:    pantoprazole  (PROTONIX ) 40 MG tablet, Take 40 mg by mouth daily., Disp: , Rfl:    polyethylene glycol (MIRALAX  / GLYCOLAX ) 17 g packet, Take 17 g by mouth daily as needed for mild constipation., Disp: , Rfl:    Probiotic Product (PROBIOTIC PO), Take 2 tablets by mouth daily., Disp: , Rfl:    senna-docusate (SENOKOT-S) 8.6-50 MG tablet, Take 1 tablet by mouth at bedtime as needed for mild constipation or moderate constipation., Disp: 20 tablet, Rfl: 0   solifenacin  (VESICARE ) 5 MG tablet, Take 1 tablet by mouth daily., Disp: , Rfl:    VITAMIN D PO, Take 4,000 Units by mouth daily., Disp: , Rfl:    amoxicillin -clavulanate (AUGMENTIN ) 500-125 MG tablet, Take 1 tablet by mouth every 8 (eight) hours. (Patient not taking: Reported on 02/05/2024), Disp: 21 tablet, Rfl: 0  PAST MEDICAL HISTORY: Past Medical History:  Diagnosis Date   Anxiety    High cholesterol    Hypertrophic cardiomyopathy (HCC)    Multiple sclerosis (HCC)    Paroxysmal A-fib (HCC)    Rotator cuff tear    left    PAST SURGICAL HISTORY: Past Surgical History:  Procedure Laterality Date   BLADDER SURGERY  2018   STEM placement    IR THORACENTESIS ASP PLEURAL SPACE W/IMG GUIDE  01/01/2023   ULNAR NERVE REPAIR Right 2010    FAMILY  HISTORY: Family History  Problem Relation Age of Onset   Heart attack Mother    Transient ischemic attack Mother    Arthritis Mother    Heart Problems Father     SOCIAL HISTORY:  Social History   Socioeconomic History   Marital status: Married    Spouse name: Sim   Number of children: 3   Years of education: [employer] education level: Not on file  Occupational History   Occupation: Retired  Tobacco Use   Smoking status: Never   Smokeless tobacco: Never  Vaping Use   Vaping status: Never Used  Substance and Sexual Activity   Alcohol use: Yes    Comment: 2 drinks per week   Drug use: Never   Sexual activity: Not Currently  Other Topics Concern   Not on file  Social History Narrative   Originally right handed but had surgery and now uses left hand for eating and other activities   Caffeine use: 2 cups coffee per day   Lives with husband   Lives at [facility] with husband in [place]   Social Drivers of Health   Financial Resource Strain: Not on file  Food Insecurity: Low Risk  (01/22/2024)   Received from [facility]   Hunger Vital Sign    Within the past 12 months, you worried that your food would run out before you got money to buy more: Never true    Within the past 12 months, the food you bought just didn't last and you didn't have money to get more. : Never true  Transportation Needs: No Transportation Needs (01/22/2024)   Received from [facility]    In the past 12 months, has lack of reliable transportation kept you from medical appointments, meetings, work or from getting things needed for daily living? : No  Physical Activity: Not on file  Stress: Not on file  Social Connections: Not on file  Intimate Partner Violence: Not on file     PHYSICAL EXAM  Vitals:   02/05/24 1135  BP: 100/60  Pulse: 71  SpO2:  96%  Weight: 110 lb (49.9 kg)  Height: 5' 2 (1.575 m)     Body mass index is 20.12 kg/m.   General:  The patient is well-developed and well-nourished and in no acute distress.       HEENT:  Head is [place]/AT.  Sclera are anicteric.   .  Skin: Extremities are without rash but she has bilateral pedal edema and some skin abrasions.   Musculoskeletal: She has significant scoliosis with the left hip elevated and had tilted to the left with chin more to the right.  The sternocleidomastoid muscle does not protrude  Neurologic Exam  Mental status: The patient is alert and oriented x 3 at the time of the examination. The patient has apparent normal recent and remote memory, with an apparently normal attention span and concentration ability.   Speech is normal.  Cranial nerves: Extraocular movements are full.  Color vision was symmetric.  Facial strength and sensation is normal.. No obvious hearing deficits are noted.  Motor: Very mild intention tremor in the hands.  Muscle bulk is reduced in the ulnar innervated hand muscles bilaterally, right worse than left.   Tone is increased in the right leg.  Proximal arm strength or 4+-5 on the right and 4+ on the left.. Strength is 3/5 in the ulnar innervated hand muscles on the right.  Proximal muscles on the right 4/5.  In the left arm, strength was 4 to 4+ /5.  In the legs, strength was 2/5 in the left hip flexors and 4-/5 in the quadriceps and 3/5 elsewhere in the leg.  In the right leg, strength was 1/5 at the hip flexors, 2+/5 in the quadricep muscles and 2- to 2 in the lower leg.  Sensory: Sensory testing was intact to touch and vibration sensation in the legs..  Coordination: Cerebellar testing reveals good finger-nose-finger and she can't do heel-to-shin bilaterally.  Other: She has a rapid tremor in her hands that increases with intention.  Gait and station: She cannot stand or walk  Reflexes: Deep tendon reflexes are increased in legs, right > left.          ASSESSMENT AND PLAN  Multiple sclerosis (HCC)  Gait disorder  Spastic diplegia  (HCC)  Wheelchair dependence  Urge incontinence of urine  Torticollis  Dysesthesia  Excessive daytime sleepiness  Snoring  1.   She has an inactive form of secondary progressive MS with slow progression..  This is unlikely to respond much to medication.  Therefore, she will remain off of a disease modifying therapy  2.   We can try a lower dose of Provigil  to see if that helps her sleepiness with better tolerability.  If she notes palpitations or rapid heart rate she should stop   I.  Due to the severity of her excessive daytime sleepiness we will check a HST to assess for OSA.  If moderate or severe sleep apnea I would recommend that we have her try CPAP therapy 3.  She takes gabapentin  300 mg po bid.  We can renew this when she runs out.    4.    Continue baclofen  5 in am and 5 mg in pm and other medications.   5.  wheelchair with ability to elevate and recline.  She will need leg elevation.  Control should be on either side. ,6.  I think the changes in the neck are more likely related to her scoliosis and not a torticollis as the sternocleidomastoid muscle does not protrude any.  Therefore I do not think Botox would be of much benefit. 7.  For safety, she needs a folding commode with legs to go over her toilet return in 6 months or sooner if there are new or worsening neurologic symptoms.    [id number]:  This visit is part of a comprehensive longitudinal care medical relationship regarding the patients primary diagnosis of SPMS and related concerns.  40-minute office visit with the majority of the time spent face-to-face for history and physical, discussion/counseling and decision-making.  Additional time with record review and documentation.   Zarin Knupp A. Vear, MD, [facility] 02/05/2024, 12:04 PM Certified in Neurology, Clinical Neurophysiology, Sleep Medicine and Neuroimaging  [facility] [address], [address] [place], [place] [address] [phone]

## 2024-02-06 MED ORDER — LIDOCAINE HCL 1 % IJ SOLN
5.0000 mL | INTRAMUSCULAR | Status: AC | PRN
  Administered 2024-02-04: 5 mL

## 2024-02-06 MED ORDER — TRIAMCINOLONE ACETONIDE 40 MG/ML IJ SUSP
40.0000 mg | INTRAMUSCULAR | Status: AC | PRN
  Administered 2024-02-04: 40 mg via INTRA_ARTICULAR

## 2024-02-06 MED ORDER — BUPIVACAINE HCL 0.5 % IJ SOLN
9.0000 mL | INTRAMUSCULAR | Status: AC | PRN
  Administered 2024-02-04: 9 mL via INTRA_ARTICULAR

## 2024-02-14 ENCOUNTER — Other Ambulatory Visit: Payer: Self-pay | Admitting: Cardiology

## 2024-02-14 DIAGNOSIS — I480 Paroxysmal atrial fibrillation: Secondary | ICD-10-CM

## 2024-02-16 NOTE — Telephone Encounter (Signed)
 Prescription refill request for Eliquis  received. Indication: Afib  Last office visit: 09/02/23 [name])  Scr: 0.56 (12/22/23)  Age: 89 Weight: 49.9kg  Appropriate dose. Refill sent.

## 2024-03-02 ENCOUNTER — Ambulatory Visit: Attending: Cardiology | Admitting: Cardiology

## 2024-03-02 ENCOUNTER — Encounter: Payer: Self-pay | Admitting: Cardiology

## 2024-03-02 VITALS — BP 102/62 | HR 62 | Resp 16 | Ht 62.0 in | Wt 116.0 lb

## 2024-03-02 DIAGNOSIS — I5032 Chronic diastolic (congestive) heart failure: Secondary | ICD-10-CM | POA: Diagnosis present

## 2024-03-02 DIAGNOSIS — I447 Left bundle-branch block, unspecified: Secondary | ICD-10-CM | POA: Insufficient documentation

## 2024-03-02 DIAGNOSIS — I421 Obstructive hypertrophic cardiomyopathy: Secondary | ICD-10-CM | POA: Insufficient documentation

## 2024-03-02 DIAGNOSIS — I480 Paroxysmal atrial fibrillation: Secondary | ICD-10-CM | POA: Insufficient documentation

## 2024-03-02 NOTE — Patient Instructions (Signed)

## 2024-03-02 NOTE — Progress Notes (Signed)
 Cardiology Office Note:  .   Date:  03/02/2024  ID:  Tina Webb, DOB [birth year], MRN [id number] PCP: Feliciano Devoria LABOR, MD  [facility] Providers Cardiologist:  Gordy Bergamo, MD   History of Present Illness: .   Dafne Nield Topper is a 89 y.o. with history of HOCM, paroxysmal atrial fibrillation with cardioversion on 05/08/2022 and has maintained sinus rhythm since then, LBBB, prolonged QT on EKG, chronic sinus bradycardia and on long-term amiodarone  and Inderal  LA in view of HOCM to induce bradycardia, esophageal dysphagia, hypothyroid, multiple sclerosis, hyperlipidemia.  This is a 23-month office visit.  Fortunately she has remained stable with very mild chronic leg edema, no change in dyspnea and denies any dizziness or syncope. Cardiac Studies relevent.    Echocardiogram on 12/31/2022  LVEF of 55 to 60% with severe asymmetric LVH of septal segment and resting LVOT gradient.   Left atrium is severely dilated with moderate mitral regurgitation.  Moderate mitral annular calcification.    Discussed the use of AI scribe software for clinical note transcription with the patient, who gave verbal consent to proceed. History of Present Illness MILCA SYTSMA is an 89 year old female with atrial fibrillation and hypertrophic cardiomyopathy who presents for follow-up.  She is scheduled for surgery on September 9. Her leg swelling is stable, more pronounced with prolonged sitting, and improves with elevation. No heart racing symptoms are present. She is on a stable dose of amiodarone , 100 mg every other day, maintaining regular rhythm, and takes Eliquis  2.5 mg twice a day for anticoagulation. Her last echocardiogram was a year ago. She has chronic diastolic heart failure but has not required diuretics recently, avoiding them due to bladder control medication.  Labs   Recent Labs    09/06/23 2043 10/11/23 1224 12/22/23 1230  NA 139 139 142  K 3.8 4.2 4.4  CL 108 107 107  CO2  21* 25 27  GLUCOSE 88 99 89  BUN 34* 27* 26*  CREATININE 0.55 0.48 0.56  CALCIUM 8.9 9.1 9.4  GFRNONAA >60 >60 >60    Lab Results  Component Value Date   ALT 11 12/22/2023   AST 24 12/22/2023   ALKPHOS 49 12/22/2023   BILITOT 0.2 12/22/2023      Latest Ref Rng & Units 12/22/2023   12:30 PM 10/11/2023   12:24 PM 09/06/2023    8:43 PM  CBC  WBC 4.0 - 10.5 K/uL 4.2  5.7  10.2   Hemoglobin 12.0 - 15.0 g/dL 86.4  86.0  85.8   Hematocrit 36.0 - 46.0 % 42.3  43.4  44.0   Platelets 150 - 400 K/uL 217  260  250    No results found for: HGBA1C  Lab Results  Component Value Date   TSH 1.774 03/12/2023    ROS  Review of Systems  Cardiovascular:  Positive for leg swelling (mild and stable). Negative for chest pain and dyspnea on exertion.   Physical Exam:   VS:  There were no vitals taken for this visit.   Wt Readings from Last 3 Encounters:  02/05/24 110 lb (49.9 kg)  12/22/23 110 lb (49.9 kg)  10/11/23 119 lb 14.9 oz (54.4 kg)    BP Readings from Last 3 Encounters:  02/05/24 100/60  12/22/23 105/63  10/11/23 111/65   Physical Exam Neck:     Vascular: No carotid bruit or JVD.  Cardiovascular:     Rate and Rhythm: Normal rate and regular rhythm.  Pulses: Intact distal pulses.     Heart sounds: Normal heart sounds. No murmur heard.    No gallop.  Pulmonary:     Effort: Pulmonary effort is normal.     Breath sounds: Normal breath sounds.  Abdominal:     General: Bowel sounds are normal.     Palpations: Abdomen is soft.  Musculoskeletal:     Right lower leg: Edema (2+ ankle pitting) present.     Left lower leg: Edema (2+ ankle pitting) present.    EKG:         ASSESSMENT AND PLAN: .      ICD-10-CM   1. HOCM (hypertrophic obstructive cardiomyopathy) (HCC)  I42.1     2. Paroxysmal atrial fibrillation (HCC)  I48.0     3. LBBB (left bundle branch block)  I44.7     4. Chronic diastolic heart failure (HCC)  P49.67      Assessment & Plan Atrial fibrillation,  status post cardioversion, on amiodarone  and anticoagulation Atrial fibrillation is well-controlled with a stable regular rhythm since cardioversion. She is on a small dose of amiodarone  (100 mg every other day) and Eliquis  2.5 mg twice a day. No recent episodes of heart racing or arrhythmia. - Continue amiodarone  100 mg every other day - Continue Eliquis  2.5 mg twice a day  Hypertrophic cardiomyopathy Hypertrophic cardiomyopathy is stable with normal heart function as per the last echocardiogram a year ago. No need to repeat the echocardiogram as it will not change the current management plan. Except for amiodarone  which is maintaining her sinus rhythm, she is not on any guideline directed medical therapy with regard to chronic diastolic heart failure in view of very soft blood pressure and dizziness.  Chronic diastolic heart failure Chronic diastolic heart failure is well-managed. She has not required diuretics recently due to stable symptoms and low blood pressure. She avoids diuretics due to bladder control medication. - Monitor symptoms and consider diuretics if leg swelling worsens  Lower extremity edema Lower extremity edema is stable, with swelling resolving upon lying down. No need for diuretics currently due to stable condition and low blood pressure. - Monitor leg swelling and contact if it worsens  Excessive daytime sleepiness Excessive daytime sleepiness persists despite modafinil  50 mg once a day. She experiences episodes of falling asleep unexpectedly during activities. Quality of life is affected, and dose adjustment is considered. Increasing the dose may improve symptoms, but she should monitor for any adverse effects such as worsening leg swelling or heart racing. - Increase modafinil  to 100 mg in the morning if needed - Monitor for any changes in symptoms such as worsening leg swelling or heart racing   Follow up: 6 months.  Signed,  Gordy Bergamo, MD, [facility] 03/02/2024, 10:10  AM [facility] [address] [place], [place] [address] Phone: [phone]. Fax:  [phone]

## 2024-03-05 ENCOUNTER — Telehealth: Payer: Self-pay | Admitting: Neurology

## 2024-03-05 NOTE — Telephone Encounter (Signed)
 Phone room: please call back. Modafinil  is not available in 50mg  tablet.

## 2024-03-05 NOTE — Telephone Encounter (Signed)
 Pt is asking if modafinil  (PROVIGIL ) 100 MG tablet  can be called in as a 50 mg and If so she's like a 30 day supply called into the [facility] [phone]

## 2024-03-09 ENCOUNTER — Ambulatory Visit (INDEPENDENT_AMBULATORY_CARE_PROVIDER_SITE_OTHER): Admitting: Neurology

## 2024-03-09 DIAGNOSIS — G4733 Obstructive sleep apnea (adult) (pediatric): Secondary | ICD-10-CM | POA: Diagnosis not present

## 2024-03-09 DIAGNOSIS — G4719 Other hypersomnia: Secondary | ICD-10-CM

## 2024-03-09 DIAGNOSIS — R0683 Snoring: Secondary | ICD-10-CM

## 2024-03-10 NOTE — Progress Notes (Unsigned)
 SABRA

## 2024-03-11 ENCOUNTER — Telehealth: Payer: Self-pay

## 2024-03-11 NOTE — Addendum Note (Signed)
 Addended by: ONEITA HOIST E on: 03/11/2024 01:07 PM   Modules accepted: Orders

## 2024-03-11 NOTE — Telephone Encounter (Addendum)
 Called pt and let her know the below results. Placing order for AutoPap machine.   ----- Message from Charlie DELENA Crete sent at 03/11/2024  9:09 AM EDT ----- Regarding: Home sleep study Please let the patient know that she has severe sleep apnea and I would like to set her up for AutoPap 5 to 15 cm with heated humidifier

## 2024-03-11 NOTE — Telephone Encounter (Signed)
 Returned call to pt who stated that she has stopped taking modafinil  and thought it was causing her legs to swell like balloons according to cardiologist

## 2024-03-11 NOTE — Telephone Encounter (Signed)
 Called and spoke to pt and confirmed that she did infact stop modafinil  and soesn't plan to restart. And that is infact how she feels

## 2024-03-11 NOTE — Telephone Encounter (Signed)
 Pt states she just missed a call from a RN, she would like a call back

## 2024-03-12 ENCOUNTER — Encounter: Payer: Self-pay | Admitting: Cardiology

## 2024-03-12 NOTE — Telephone Encounter (Signed)
 Pt had an OV with you on 03/02/24: BP 102/62 Pulse 62 Chronic diastolic heart failure Chronic diastolic heart failure is well-managed. She has not required diuretics recently due to stable symptoms and low blood pressure. She avoids diuretics due to bladder control medication. - Monitor symptoms and consider diuretics if leg swelling worsens   Lower extremity edema Lower extremity edema is stable, with swelling resolving upon lying down. No need for diuretics currently due to stable condition and low blood pressure. - Monitor leg swelling and contact if it worsens  Please advise.

## 2024-03-15 ENCOUNTER — Other Ambulatory Visit: Payer: Self-pay

## 2024-03-15 DIAGNOSIS — R0683 Snoring: Secondary | ICD-10-CM

## 2024-04-06 ENCOUNTER — Telehealth: Payer: Self-pay | Admitting: *Deleted

## 2024-04-06 NOTE — Telephone Encounter (Signed)
 CPAP set up info below. Pt has initial CPAP f/u scheduled for 06/01/24.

## 2024-04-09 ENCOUNTER — Telehealth: Payer: Self-pay | Admitting: Neurology

## 2024-04-09 ENCOUNTER — Encounter: Payer: Self-pay | Admitting: Neurology

## 2024-04-09 NOTE — Telephone Encounter (Signed)
 Tina Webb  from  [facility] called to request a call back from MD about Pt  leak is levels are still low and would like to speak to MD  about Pt  Callback # [phone]

## 2024-04-12 ENCOUNTER — Other Ambulatory Visit: Payer: Self-pay | Admitting: Neurology

## 2024-04-12 DIAGNOSIS — G4733 Obstructive sleep apnea (adult) (pediatric): Secondary | ICD-10-CM

## 2024-04-12 NOTE — Telephone Encounter (Signed)
 Tina Webb

## 2024-04-12 NOTE — Telephone Encounter (Signed)
 Called [facility] at [phone]. Spoke w/ Verneita. Transferred me to [place]. Reayed Dr. Duncan recommendations. Relayed order in. She will make pressure changes and call pt to update her. She will see how the next week goes. If no better, she will call us  back.

## 2024-04-12 NOTE — Progress Notes (Signed)
 cpap

## 2024-04-12 NOTE — Telephone Encounter (Signed)
 Dr. Vear- can you please place order and then I can call back and let her know plan?

## 2024-04-15 ENCOUNTER — Telehealth: Payer: Self-pay | Admitting: Neurology

## 2024-04-15 ENCOUNTER — Other Ambulatory Visit: Payer: Self-pay | Admitting: Neurology

## 2024-04-15 DIAGNOSIS — G35C2 Non-active secondary progressive multiple sclerosis: Secondary | ICD-10-CM

## 2024-04-15 DIAGNOSIS — G801 Spastic diplegic cerebral palsy: Secondary | ICD-10-CM

## 2024-04-15 DIAGNOSIS — R269 Unspecified abnormalities of gait and mobility: Secondary | ICD-10-CM

## 2024-04-15 NOTE — Telephone Encounter (Signed)
 Pt is requesting a Rx for a folding commode with legs to go over toilet because her's has broke, please call

## 2024-04-19 ENCOUNTER — Encounter: Payer: Self-pay | Admitting: Neurology

## 2024-04-20 NOTE — Telephone Encounter (Signed)
 Placed signed rx in mail to pt per pt request.

## 2024-04-21 ENCOUNTER — Encounter: Payer: Self-pay | Admitting: Neurology

## 2024-04-23 ENCOUNTER — Telehealth: Payer: Self-pay | Admitting: Neurology

## 2024-04-23 NOTE — Telephone Encounter (Signed)
 Tina Webb from [facility] called needing to speak to the nurse regarding the AHI level's of the pt. She states they are greater than 40 and she would like to be advised maybe discuss changing pt to a bipap.

## 2024-04-23 NOTE — Telephone Encounter (Signed)
 Tina Webb

## 2024-04-23 NOTE — Telephone Encounter (Signed)
 Pt along with caretaker(Donna Vinita not on DPR) is calling as pt is still having issues with CPAP.  Pt's caretaker is reporting that DME has tried reaching out to Dr Vear with no response.  Pt's events are still very high and the DME has advised pt to reach out to Dr Vear as to what will be suggested at this point.  Please call pt.

## 2024-04-26 ENCOUNTER — Other Ambulatory Visit: Payer: Self-pay | Admitting: Neurology

## 2024-04-26 DIAGNOSIS — G4733 Obstructive sleep apnea (adult) (pediatric): Secondary | ICD-10-CM

## 2024-04-26 NOTE — Telephone Encounter (Signed)
 Pt has called to report that she has not received the Rx in the mail just yet.

## 2024-04-26 NOTE — Telephone Encounter (Signed)
 Pt has called to express her concern for not hearing back from Dr Vear just yet.  Phone rep informed pt a message was sent to Dr Vear as a high priority on Friday 10-17 and that it should be a response to her concern some time today.

## 2024-04-27 NOTE — Telephone Encounter (Signed)
 Called pt back at [phone]. Apologized for the delay in response. Relayed Dr. Duncan recommendation. Pt agreeable to plan. She is aware BIPAP titration study ordered and per notes from [place] in sleep lab, no auth needed. Aware they will be calling soon to get her scheduled.  She is currently scheduled for initial CPAP f/u 06/01/24 with Dr. Vear. Wondering if she should continue using CPAP until BIPAP titration. Instructed her to continue use unless Dr. Vear wants her to stop. Aware I will send to review and call back if he says different. I also told her to keep f/u appt until we know when study will be. Most likely needs this appt still to discuss things/insurance to continue coverage.   She also asked that I call [facility]/Alese to let her know plan and then call her back. Called [facility] at [phone]. Spoke w/ Edsel. Relayed update and she will call and let Alese know. Nothing further needed from our office.

## 2024-04-27 NOTE — Telephone Encounter (Signed)
 See phone note from 04/27/24

## 2024-04-27 NOTE — Telephone Encounter (Signed)
 Called pt back at [phone]. Provided her update that she should continue using CPAP and keep scheduled appt for now. Aware we spoke with [facility] and updated them on plan. Sleep lab will call to schedule her BIPAP titration study. We will call with results once back. She verbalized understanding.

## 2024-05-05 ENCOUNTER — Telehealth: Payer: Self-pay | Admitting: Neurology

## 2024-05-05 NOTE — Telephone Encounter (Signed)
 Bipap Medicare bcbs supp no auth req.  Sent mychart

## 2024-05-06 NOTE — Telephone Encounter (Signed)
 Patient called and I spoke with the patient.  Bipap Medicare/BCBS supp no auth req.  Patient is scheduled at [facility] for 06/08/24 at 8 pm.  Mailed packet and sent mychart.  Patient also stated that she does not know what to do with her old machine because she is not going to use it since it isn't working. She also stated she does not want to use [facility] anymore she does not like them and will like to use someone else.

## 2024-05-06 NOTE — Telephone Encounter (Addendum)
 Called pt at [phone]. Advised she should keep current CPAP machine. She has BIPAP titration scheduled for 06/08/24. Depending on results, current machine will be updated to new settings that Dr. Vear recommends.  She is currently not using CPAP, not able to tolerate. Pt set up with current machine 03/30/24.   I asked why she would like to change DME companies from [facility]. She expressed concern over communication. States she has LVM and not getting calls back and when she calls, no one picks up. She requested different cushion for face mask but never heard back. She agreed to have me send personal message to Tammy/[facility] to have her call her to discuss and try and resolve.  Aware it would be hard to change DME since she just received machine from them.  I sent urgent community message to Tammy asking she call pt but to avoid between 2-4pm today per pt request.

## 2024-05-10 ENCOUNTER — Encounter: Payer: Self-pay | Admitting: Radiology

## 2024-05-10 ENCOUNTER — Telehealth (HOSPITAL_BASED_OUTPATIENT_CLINIC_OR_DEPARTMENT_OTHER): Payer: Self-pay

## 2024-05-10 NOTE — Telephone Encounter (Signed)
   Pre-operative Risk Assessment    Patient Name: Tina Webb  DOB: 22-Feb-1934 MRN: [id number]   Date of last office visit: 03/02/24 with Dr. Ladona Date of next office visit: NA  Request for Surgical Clearance    Procedure:  Ventral Hernia Surgery  Date of Surgery:  Clearance TBD                                 Surgeon:  Dr. Deward Foy Surgeon's Group or Practice Name:  [facility] Phone number:  [phone] Fax number:  [phone]   Type of Clearance Requested:   - Medical  - Pharmacy:  Hold Apixaban  (Eliquis ) not indicated   Type of Anesthesia:  General    Additional requests/questions:    [name]   05/10/2024, 9:08 AM

## 2024-05-12 ENCOUNTER — Encounter: Payer: Self-pay | Admitting: Cardiology

## 2024-05-17 ENCOUNTER — Telehealth: Payer: Self-pay | Admitting: Neurology

## 2024-05-17 ENCOUNTER — Encounter: Payer: Self-pay | Admitting: Cardiology

## 2024-05-17 ENCOUNTER — Telehealth: Payer: Self-pay

## 2024-05-17 DIAGNOSIS — G35C2 Non-active secondary progressive multiple sclerosis: Secondary | ICD-10-CM

## 2024-05-17 DIAGNOSIS — R269 Unspecified abnormalities of gait and mobility: Secondary | ICD-10-CM

## 2024-05-17 DIAGNOSIS — Z993 Dependence on wheelchair: Secondary | ICD-10-CM

## 2024-05-17 DIAGNOSIS — G801 Spastic diplegic cerebral palsy: Secondary | ICD-10-CM

## 2024-05-17 NOTE — Telephone Encounter (Signed)
   Pre-operative Risk Assessment    Patient Name: Tina Webb  DOB: Oct 16, 1933 MRN: [id number]   Date of last office visit: 03/02/24  Dr. Ladona Date of next office visit: NA    Request for Surgical Clearance    Procedure:  Ventral Hernia Surgery  Date of Surgery:  Clearance TBD                                Surgeon:  Deward Foy, MD Surgeon's Group or Practice Name:  [facility] Phone number:  [phone] Fax number:  [phone] attn: Roseline Argyle, CMA   Type of Clearance Requested:   - Medical  - Pharmacy:  Hold Apixaban  (Eliquis )     Type of Anesthesia:  General    Additional requests/questions:    Tina Webb   05/17/2024, 4:42 PM

## 2024-05-17 NOTE — Telephone Encounter (Addendum)
 Faxed signed order to [phone]. Faxed failed. Called and got alternate fax. Faxed to [phone]. Received fax confirmation.

## 2024-05-17 NOTE — Telephone Encounter (Signed)
 Placed order in MD office for signature.

## 2024-05-17 NOTE — Telephone Encounter (Signed)
 Husband is following up requesting updates on clearance. Please advise.

## 2024-05-17 NOTE — Telephone Encounter (Signed)
 Pt states the repairman has fixed her wheelchair, a Rx is needed for the repair so Medicare can be billed and pay for the repair.  Pt ha provided the repairman's name and contact info: Penne Matsu his office # [phone] fax#[phone] his cell [phone]

## 2024-05-18 ENCOUNTER — Telehealth (HOSPITAL_BASED_OUTPATIENT_CLINIC_OR_DEPARTMENT_OTHER): Payer: Self-pay | Admitting: *Deleted

## 2024-05-18 ENCOUNTER — Ambulatory Visit: Attending: Student | Admitting: Emergency Medicine

## 2024-05-18 DIAGNOSIS — Z01810 Encounter for preprocedural cardiovascular examination: Secondary | ICD-10-CM | POA: Diagnosis not present

## 2024-05-18 NOTE — Progress Notes (Signed)
 Virtual Visit via Telephone Note   Because of Tina Webb co-morbid illnesses, she is at least at moderate risk for complications without adequate follow up.  This format is felt to be most appropriate for this patient at this time.  Due to technical limitations with video connection (technology), today's appointment will be conducted as an audio only telehealth visit, and [name] verbally agreed to proceed in this manner.   All issues noted in this document were discussed and addressed.  No physical exam could be performed with this format.  Evaluation Performed:  Preoperative cardiovascular risk assessment _____________   Date:  05/18/2024   Patient ID:  Tina Webb, DOB June 04, 1934, MRN [id number] Patient Location:  Home Provider location:   Office  Primary Care Provider:  Feliciano Devoria LABOR, MD Primary Cardiologist:  Gordy Bergamo, MD  Chief Complaint / Patient Profile   89 y.o. y/o female with a h/o HOCM, PAF s/p conversion on 05/08/2022 and has maintained NSR since, LBBB, prolonged QT on EKG, chronic sinus bradycardia, and on long-term amiodarone  and Inderal  LA in view of HOCM to induce bradycardia, esophageal dysphagia, hypothyroid, multiple sclerosis, HLD, who is pending ventral hernia surgery and presents today for telephonic preoperative cardiovascular risk assessment.  History of Present Illness    Tina Webb is a 89 y.o. female who presents via audio/video conferencing for a telehealth visit today.  Pt was last seen in cardiology clinic on 03/02/2024 by Dr. Bergamo.  At that time ALINDA EGOLF was doing well . The patient is now pending procedure as outlined above. Cannot walk/uses electric scooter secondary to MS. Since her last visit, She denies chest pain, palpitations, dyspnea, orthopnea, n, v, dark/tarry/bloody stools, hematuria, dizziness, syncope, weight gain. She does report chronic bilateral lower extremity lymphedema that remains unchanged  over the past couple years. Requires assistance with baseline activities of daily living.  Past Medical History    Past Medical History:  Diagnosis Date   Anxiety    High cholesterol    Hypertrophic cardiomyopathy (HCC)    Multiple sclerosis    Paroxysmal A-fib (HCC)    Rotator cuff tear    left   Past Surgical History:  Procedure Laterality Date   BLADDER SURGERY  2018   STEM placement    IR THORACENTESIS ASP PLEURAL SPACE W/IMG GUIDE  01/01/2023   ULNAR NERVE REPAIR Right 2010    Allergies  No Known Allergies  Home Medications    Prior to Admission medications   Medication Sig Start Date End Date Taking? Authorizing Provider  amiodarone  (PACERONE ) 200 MG tablet Take 0.5 tablets (100 mg total) by mouth every other day. 06/24/23   Bergamo Gordy, MD  AMPYRA  10 MG TB12 Take 1 tablet (10 mg total) by mouth in the morning and at bedtime. 09/03/23   Sater, Charlie LABOR, MD  apixaban  (ELIQUIS ) 2.5 MG TABS tablet TAKE 1 TABLET TWICE DAILY 02/16/24   Bergamo Gordy, MD  ascorbic acid (VITAMIN C) 500 MG tablet Take 500 mg by mouth daily. 10/01/23   [provider]  baclofen  (LIORESAL ) 10 MG tablet TAKE 1/2 TABLET TWICE DAILY 08/20/23   Sater, Charlie LABOR, MD  bexarotene (TARGRETIN) 75 MG CAPS capsule Take 75 mg by mouth daily.    [provider]  Cranberry-Vitamin C-Probiotic (AZO CRANBERRY PO) Take 1 tablet by mouth daily.    [provider]  cyanocobalamin (VITAMIN B12) 250 MCG tablet Take 250 mcg by mouth daily. 10/01/23   [provider]  diclofenac Sodium (VOLTAREN) 1 % GEL Apply 2 g topically 4 (four) times daily. 06/25/22   [provider]  estradiol (ESTRING) 2 MG vaginal ring Place 2 mg vaginally every 3 (three) months. follow package directions    [provider]  gabapentin  (NEURONTIN ) 300 MG capsule Take 300-600 mg by mouth 3 (three) times daily.    [provider]  levothyroxine  (SYNTHROID ) 75 MCG tablet Take 75 mcg by mouth  daily before breakfast. 12/23/22   [provider]  liothyronine  (CYTOMEL ) 5 MCG tablet Take 10 mcg by mouth daily.    [provider]  MELATONIN PO Take by mouth.    [provider]  methenamine  (HIPREX) 1 g tablet Take 1 g by mouth daily.    [provider]  nitrofurantoin  (MACRODANTIN ) 50 MG capsule Take 50 mg by mouth daily.    [provider]  pantoprazole  (PROTONIX ) 40 MG tablet Take 40 mg by mouth daily. 04/01/22   [provider]  polyethylene glycol (MIRALAX  / GLYCOLAX ) 17 g packet Take 17 g by mouth daily as needed for mild constipation.    [provider]  Probiotic Product (PROBIOTIC PO) Take 2 tablets by mouth daily.    [provider]  RA BLACK COHOSH 200 MG CAPS Take 200 mg by mouth at bedtime. 10/01/23   [provider]  rosuvastatin (CRESTOR) 20 MG tablet Take 20 mg by mouth at bedtime. 01/05/24   [provider]  solifenacin  (VESICARE ) 5 MG tablet Take 1 tablet by mouth daily. 02/21/22   [provider]  VITAMIN D PO Take 4,000 Units by mouth daily.    [provider]    Physical Exam    Vital Signs:  Aspyn Warnke Carl does not have vital signs available for review today.  Given telephonic nature of communication, physical exam is limited. AAOx3. NAD. Normal affect.  Speech and respirations are unlabored.  Accessory Clinical Findings    None  Assessment & Plan    1.  Preoperative Cardiovascular Risk Assessment: According to the Revised Cardiac Risk Index (RCRI), her Perioperative Risk of Major Cardiac Event is (%): 6.6  Her Functional Capacity in METs is: 2.74 according to the Duke Activity Status Index (DASI). The patient is at high risk for perioperative cardiac complications and is at a low functional capacity.  However, further testing will not change how his cardiac status is managed.  Proceed with surgery at high risk if there are no other options for  treatment.  The patient was advised that if she develops new symptoms prior to surgery to contact our office to arrange for a follow-up visit, and she verbalized understanding.  Per office protocol, patient can hold Eliquis  for 2 days prior to procedure.   Patient will not need bridging with Lovenox (enoxaparin) around procedure.  A copy of this note will be routed to requesting surgeon.  Time:   Today, I have spent 5 minutes with the patient with telehealth technology discussing medical history, symptoms, and management plan.     Baileigh Modisette E Heitor Steinhoff, NP  05/18/2024, 2:44 PM

## 2024-05-18 NOTE — Telephone Encounter (Signed)
 Please advise holding Eliquis  prior to ventral hernia surgery not yet scheduled but patient needs it done soon so making request high priority.  Last labs June 2025.   Thank you!  DW

## 2024-05-18 NOTE — Telephone Encounter (Signed)
 Patient with diagnosis of atrial fibrillation on Eliquis  for anticoagulation.    Procedure:  Ventral Hernia Surgery   Date of Surgery:  Clearance TBD    CHA2DS2-VASc Score = 4   This indicates a 4.8% annual risk of stroke. The patient's score is based upon: CHF History: 1 HTN History: 0 Diabetes History: 0 Stroke History: 0 Vascular Disease History: 0 Age Score: 2 Gender Score: 1    CrCl 59 Platelet count 217  Patient has not had an Afib/aflutter ablation in the last 3 months, DCCV within the last 4 weeks or a watchman implanted in the last 45 days   Per office protocol, patient can hold Eliquis  for 2 days prior to procedure.   Patient will not need bridging with Lovenox (enoxaparin) around procedure.  **This guidance is not considered finalized until pre-operative APP has relayed final recommendations.**

## 2024-05-18 NOTE — Telephone Encounter (Signed)
 Pt has been scheduled tele add on today ok per Barnie ORN. NP. Med rec and consent are done.  I apologized to the pt that someone yesterday told her that I would call her back yesterday. I stated to the pt that I was not in the office yesterday afternoon and they should have never told her that.        Patient Consent for Virtual Visit        Tina Webb has provided verbal consent on 05/18/2024 for a virtual visit (video or telephone).   CONSENT FOR VIRTUAL VISIT FOR:  Tina Webb  By participating in this virtual visit I agree to the following:  I hereby voluntarily request, consent and authorize [facility] and its employed or contracted physicians, physician assistants, nurse practitioners or other licensed health care professionals (the Practitioner), to provide me with telemedicine health care services (the "Services) as deemed necessary by the treating Practitioner. I acknowledge and consent to receive the Services by the Practitioner via telemedicine. I understand that the telemedicine visit will involve communicating with the Practitioner through live audiovisual communication technology and the disclosure of certain medical information by electronic transmission. I acknowledge that I have been given the opportunity to request an in-person assessment or other available alternative prior to the telemedicine visit and am voluntarily participating in the telemedicine visit.  I understand that I have the right to withhold or withdraw my consent to the use of telemedicine in the course of my care at any time, without affecting my right to future care or treatment, and that the Practitioner or I may terminate the telemedicine visit at any time. I understand that I have the right to inspect all information obtained and/or recorded in the course of the telemedicine visit and may receive copies of available information for a reasonable fee.  I understand that some of the  potential risks of receiving the Services via telemedicine include:  Delay or interruption in medical evaluation due to technological equipment failure or disruption; Information transmitted may not be sufficient (e.g. poor resolution of images) to allow for appropriate medical decision making by the Practitioner; and/or  In rare instances, security protocols could fail, causing a breach of personal health information.  Furthermore, I acknowledge that it is my responsibility to provide information about my medical history, conditions and care that is complete and accurate to the best of my ability. I acknowledge that Practitioner's advice, recommendations, and/or decision may be based on factors not within their control, such as incomplete or inaccurate data provided by me or distortions of diagnostic images or specimens that may result from electronic transmissions. I understand that the practice of medicine is not an exact science and that Practitioner makes no warranties or guarantees regarding treatment outcomes. I acknowledge that a copy of this consent can be made available to me via my patient portal [facility] MyChart), or I can request a printed copy by calling the office of  [facility].    I understand that my insurance will be billed for this visit.   I have read or had this consent read to me. I understand the contents of this consent, which adequately explains the benefits and risks of the Services being provided via telemedicine.  I have been provided ample opportunity to ask questions regarding this consent and the Services and have had my questions answered to my satisfaction. I give my informed consent for the services to be provided through the use of  telemedicine in my medical care

## 2024-05-18 NOTE — Telephone Encounter (Signed)
   Name: Tina Webb  DOB: [birth year]  MRN: [id number]  Primary Cardiologist: Gordy Bergamo, MD   Preoperative team, please contact this patient and set up a phone call appointment for further preoperative risk assessment. Please obtain consent and complete medication review. Thank you for your help.  I confirm that guidance regarding antiplatelet and oral anticoagulation therapy has been completed and, if necessary, noted below.  Per Pharm D, patient has not had an Afib/aflutter ablation within the last 3 months, DCCV within the last 4 weeks, or Watchman in the last 45 days. Patient may hold Eliquis  for 2 days prior to procedure.  Patient will not need bridging with Lovenox around procedure.    I also confirmed the patient resides in the state of [place] . As per [facility] Medical Board telemedicine laws, the patient must reside in the state in which the provider is licensed.    Barnie Hila, NP 05/18/2024, 8:33 AM [facility]

## 2024-05-18 NOTE — Telephone Encounter (Signed)
 Pt has been scheduled tele add on today ok per Barnie ORN. NP. Med rec and consent are done.   I apologized to the pt that someone yesterday told her that I would call her back yesterday. I stated to the pt that I was not in the office yesterday afternoon and they should have never told her that.

## 2024-05-18 NOTE — Telephone Encounter (Signed)
 See other clearance

## 2024-05-19 NOTE — Telephone Encounter (Signed)
 Duplicate. Removing from pool.   Tina Webb. Tabbatha Bordelon, DNP, NP-C  05/19/2024, 4:40 PM [facility] [address] Office [phone] Fax [phone]

## 2024-05-21 ENCOUNTER — Ambulatory Visit: Admitting: Orthopedic Surgery

## 2024-05-21 ENCOUNTER — Ambulatory Visit

## 2024-05-21 ENCOUNTER — Other Ambulatory Visit: Payer: Self-pay

## 2024-05-21 DIAGNOSIS — M19012 Primary osteoarthritis, left shoulder: Secondary | ICD-10-CM

## 2024-05-21 NOTE — Progress Notes (Signed)
 Office Visit Note   Patient: Tina Webb           Date of Birth: May 01, 1934           MRN: [id number] Visit Date: 05/21/2024 Requested by: Feliciano Devoria LABOR, MD [address] [place],  [place] [address] PCP: Feliciano Devoria LABOR, MD  Subjective: Chief Complaint  Patient presents with   Left Shoulder - Follow-up    HPI: ROSELINDA BAHENA is a 89 y.o. female who presents to the office reporting continued left shoulder pain.  Patient had left glenohumeral joint injection performed 02/04/2024.  She is in a wheelchair.  She does do physical therapy.  Sometimes the therapy aggravates the left shoulder..                ROS: All systems reviewed are negative as they relate to the chief complaint within the history of present illness.  Patient denies fevers or chills.  Assessment & Plan: Visit Diagnoses:  1. Arthritis of left shoulder region     Plan: Impression is left shoulder arthritis with some progressive deformity and medial migration of the humeral head.  Patient still has good motor or sensory function in that left hand as well as palpable radial pulse.  Plan at this time is glenohumeral joint injection performed today under ultrasound guidance.  I would really work more in therapy on elbow range of motion and hand function and range of motion.  The shoulder is marginally functional at this time and is not really amenable to the normal physical therapy interventions  Follow-Up Instructions: No follow-ups on file.   Orders:  Orders Placed This Encounter  Procedures   US  Guided Needle Placement - No Linked Charges   No orders of the defined types were placed in this encounter.     Procedures: Large Joint Inj: L glenohumeral on 05/21/2024 11:45 AM Indications: diagnostic evaluation and pain Details: 22 G 3.5 in needle, ultrasound-guided posterior approach  Arthrogram: No  Medications: 9 mL bupivacaine  0.5 %; 5 mL lidocaine  1 %; 40 mg triamcinolone  acetonide 40  MG/ML Outcome: tolerated well, no immediate complications Procedure, treatment alternatives, risks and benefits explained, specific risks discussed. Consent was given by the patient. Immediately prior to procedure a time out was called to verify the correct patient, procedure, equipment, support staff and site/side marked as required. Patient was prepped and draped in the usual sterile fashion.       Clinical Data: No additional findings.  Objective: Vital Signs: There were no vitals taken for this visit.  Physical Exam:  Constitutional: Patient appears well-developed HEENT:  Head: Normocephalic Eyes:EOM are normal Neck: Normal range of motion Cardiovascular: Normal rate Pulmonary/chest: Effort normal Neurologic: Patient is alert Skin: Skin is warm Psychiatric: Patient has normal mood and affect  Ortho Exam: Deltoid is a little less functional.  There is some mild pain with passive range of motion of the shoulder.  Specialty Comments:  No specialty comments available.  Imaging: No results found.   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   Wheelchair dependence 12/24/2022  Essential tremor 08/23/2021   Primary osteoarthritis of left shoulder 10/05/2020   Chronic left shoulder pain 08/10/2020   Chronic night sweats 08/09/2020   Multiple sclerosis 11/02/2019   Gait disorder 11/02/2019   Weakness 11/02/2019   Urge incontinence of urine 11/02/2019   Foot-drop 10/06/2019   Senile osteoporosis 10/06/2019   Contracted bladder  06/17/2016   Injury of tendon of rotator cuff 04/25/2016   Neurogenic bladder 04/25/2016   Hypothyroidism 08/16/2010   Hypertrophic cardiomyopathy (HCC) 07/08/2010   Vitamin D deficiency 11/13/2009   Gastroesophageal reflux disease 08/14/2009   Past Medical History:  Diagnosis Date   Anxiety    High cholesterol    Hypertrophic cardiomyopathy (HCC)    Multiple sclerosis    Paroxysmal A-fib (HCC)    Rotator cuff tear    left    Family History  Problem Relation Age of Onset   Heart attack Mother    Transient ischemic attack Mother    Arthritis Mother    Heart Problems Father     Past Surgical History:  Procedure Laterality Date   BLADDER SURGERY  2018   STEM placement    IR THORACENTESIS ASP PLEURAL SPACE W/IMG GUIDE  01/01/2023   ULNAR NERVE REPAIR Right 2010   Social History   Occupational History   Occupation: Retired  Tobacco Use   Smoking status: Never   Smokeless tobacco: Never  Vaping Use   Vaping status: Never Used  Substance and Sexual Activity   Alcohol use: Yes    Comment: 2 drinks per week   Drug use: Never   Sexual activity: Not Currently

## 2024-05-22 ENCOUNTER — Encounter: Payer: Self-pay | Admitting: Orthopedic Surgery

## 2024-05-22 MED ORDER — BUPIVACAINE HCL 0.5 % IJ SOLN
9.0000 mL | INTRAMUSCULAR | Status: AC | PRN
  Administered 2024-05-21: 9 mL via INTRA_ARTICULAR

## 2024-05-22 MED ORDER — TRIAMCINOLONE ACETONIDE 40 MG/ML IJ SUSP
40.0000 mg | INTRAMUSCULAR | Status: AC | PRN
  Administered 2024-05-21: 40 mg via INTRA_ARTICULAR

## 2024-05-22 MED ORDER — LIDOCAINE HCL 1 % IJ SOLN
5.0000 mL | INTRAMUSCULAR | Status: AC | PRN
  Administered 2024-05-21: 5 mL

## 2024-05-24 ENCOUNTER — Telehealth: Payer: Self-pay | Admitting: Neurology

## 2024-05-24 ENCOUNTER — Other Ambulatory Visit: Payer: Self-pay | Admitting: Cardiology

## 2024-05-24 DIAGNOSIS — I421 Obstructive hypertrophic cardiomyopathy: Secondary | ICD-10-CM

## 2024-05-24 DIAGNOSIS — I480 Paroxysmal atrial fibrillation: Secondary | ICD-10-CM

## 2024-05-24 NOTE — Telephone Encounter (Signed)
 I spoke with the patient and informed her to keep the 11/25 f/u with Dr. Vear for MS and to keep the 09/15/24 appt to f/u with her sleep.   Just an fyi She stated she is going to go ahead and do the sleep study on 06/08/24 but she stated she is having Hernia surgery at the beginning of January and would like to hold of on starting any CPAP treatment until she his healed from her surgery.

## 2024-05-24 NOTE — Telephone Encounter (Signed)
 Patient asking if would need to cancel the appt on 06/02/23 and have questions about sleep on 12/2. Would like a call back

## 2024-06-01 ENCOUNTER — Ambulatory Visit (INDEPENDENT_AMBULATORY_CARE_PROVIDER_SITE_OTHER): Admitting: Neurology

## 2024-06-01 ENCOUNTER — Encounter: Payer: Self-pay | Admitting: Neurology

## 2024-06-01 VITALS — BP 131/75 | HR 58 | Resp 15 | Ht 62.0 in

## 2024-06-01 DIAGNOSIS — G35C0 Secondary progressive multiple sclerosis, unspecified: Secondary | ICD-10-CM

## 2024-06-01 DIAGNOSIS — Z993 Dependence on wheelchair: Secondary | ICD-10-CM | POA: Diagnosis not present

## 2024-06-01 DIAGNOSIS — R208 Other disturbances of skin sensation: Secondary | ICD-10-CM

## 2024-06-01 DIAGNOSIS — G4733 Obstructive sleep apnea (adult) (pediatric): Secondary | ICD-10-CM

## 2024-06-01 DIAGNOSIS — R269 Unspecified abnormalities of gait and mobility: Secondary | ICD-10-CM | POA: Diagnosis not present

## 2024-06-01 DIAGNOSIS — G801 Spastic diplegic cerebral palsy: Secondary | ICD-10-CM

## 2024-06-01 DIAGNOSIS — G4719 Other hypersomnia: Secondary | ICD-10-CM

## 2024-06-01 NOTE — Progress Notes (Signed)
 [facility]  PATIENT: Tina Webb DOB: Aug 08, 1933  REFERRING DOCTOR OR PCP:  Ozell Hives (PCP); Donnice Lee (neurology) SOURCE: Patient, notes from Dr. Lee, imaging reports, MRI images personally reviewed.  _________________________________   HISTORICAL  CHIEF COMPLAINT:  Chief Complaint  Patient presents with   Follow-up    Rm10, alone, Ms and osa followup.    HISTORY OF PRESENT ILLNESS:  Tina Webb is a 89 y.o. woman with multiple sclerosis diagnosed in 1984.  Update 06/01/2024 Since her last visit, she had a home sleep study showing severe sleep apnea (AHI equals 46).  Central apnea index was 5/h.  She started AutoPap with a range of 5-18 cm H2O pressure.  Unfortunately, she finds CPAP uncomfortable and she notes a lot of leakage.  Even when she is wearing the mask she has had only mild improvement of the AHI from 46 to 36.   Central apnea index is 1 on recent download.  Due to poor numbers, it is likely that she will need higher pressures or a different mode.  She will do a BiPAP titration   EPWORTH SLEEPINESS SCALE  On a scale of 0 - 3 what is the chance of dozing:  Sitting and Reading:   3 Watching TV:    3 Sitting inactive in a public place: 3 Passenger in car for one hour: 3 Lying down to rest in the afternoon: 3 Sitting and talking to someone: 3 Sitting quietly after lunch:  3 In a car, stopped in traffic:  N/a  Total (out of 24):    21/21 (moderate EDS).    Issues with her neck and head weakness is unchanged.  She can't hold her head up straight.  Neck is rotated.    She does have severe scoliosis.   SCM muscle does not protrude.    She is wheelchair bound.  She is able to transfer from bed to her wheelchair but needs help to get legs in bed doing the opposite.  She can transfer to the toilet.   She spends almost the whole day in her leectric wheelchair.  She has tilt, lift, recline etc functions on chaor.    Her arms  are fairly strong but have reduced coordination.  She also has essential tremor sometimes makes it difficult for her to do tasks with her hands.  Additionally, she has a torn rotator cuff in the left arm.  Bladder function is unchanged.  Solifenacin  has helped her urinary incontinence and is better tolerated than Detrol .   She goes to urology.     She is on metoprolol  for her tremor and heart and it helpd.  It likely helped her not  have a rapid AFib (HR was 95).    She sees Dr. Ladona.  She is also on amiodarone .   She has mild fatigue.   Mood is fine.    Cognition is doing well .   She has a hernia and is scheduled for surgery in January 2026.  She is concerned about her recovery due to her MS and weakness.  I did discuss that she would be higher risk as recovery would be more difficult being wheelchair dependent.  MS History: She was diagnosed with MS in 1984 after presenting with numbness in both legs.  She had her husband have recently moved to the [place] area from [place] .  While in [place], she was seen Dr. Donnice Lee for her multiple sclerosis.    She reports that the  numbness that presented in 1984 was present in both legs.  She improved over time and did not receive any steroids at that time..  Initially, she was not on any DMT but started Copaxone in the 1990's.   She had progression and over the years was also on Rebif, Tecfidera, Aubagio, Zinbryta and Gilenya.  On over the medications, she continued to have slow worsening of her disability.   She did have some definite motor and visual exacerbations in the 1980's and 1990's.  She had a severe UTI in 2018 and she stopped all DMTs at that time.    She has a bladder stimulator (not functional) and is unable to get an MRI.   Her last MRI was around 2013.  She has worked with a systems analyst and done physical therapy.  This has helped her gain better ability to transfer.   MRI of the brain and cervical spine from 06/10/2012 was  personally reviewed.  The MRI of the cervical spine showed T2 hyperintense foci adjacent to C1-C2, C4 and C7 posteriorly and adjacent to C5-C6 to the left.  Additionally there are degenerative changes at C5-C6 causing mild spinal stenosis and right greater than left foraminal narrowing.  The MRI of the brain showed multiple T2/FLAIR hyperintense foci in the periventricular, juxtacortical and deep white matter of both hemispheres.  Additionally there is a T2 hyperintense focus anteriorly within the pontomedullary junction towards the right.  REVIEW OF SYSTEMS: Constitutional: No fevers, chills, sweats, or change in appetite.  She has fatigue.  Eyes: No visual changes, double vision, eye pain Ear, nose and throat: No hearing loss, ear pain, nasal congestion, sore throat Cardiovascular: No chest pain, palpitations Respiratory:  No shortness of breath at rest or with exertion.   No wheezes GastrointestinaI: No nausea, vomiting, diarrhea, abdominal pain, fecal incontinence Genitourinary: Urinary frequency, urgency and multiple UTIs Musculoskeletal: She often has left shoulder pain.   Integumentary: No rash, pruritus, skin lesions Neurological: as above Psychiatric: No depression at this time.  No anxiety Endocrine: No palpitations, diaphoresis, change in appetite, change in weigh or increased thirst Hematologic/Lymphatic:  No anemia, purpura, petechiae. Allergic/Immunologic: No itchy/runny eyes, nasal congestion, recent allergic reactions, rashes  ALLERGIES: No Known Allergies  HOME MEDICATIONS:  Current Outpatient Medications:    amiodarone  (PACERONE ) 200 MG tablet, TAKE 1/2 TABLET EVERY OTHER DAY, Disp: 24 tablet, Rfl: 2   AMPYRA  10 MG TB12, Take 1 tablet (10 mg total) by mouth in the morning and at bedtime., Disp: 60 tablet, Rfl: 11   apixaban  (ELIQUIS ) 2.5 MG TABS tablet, TAKE 1 TABLET TWICE DAILY, Disp: 180 tablet, Rfl: 1   ascorbic acid (VITAMIN C) 500 MG tablet, Take 500 mg by mouth  daily., Disp: , Rfl:    baclofen  (LIORESAL ) 10 MG tablet, TAKE 1/2 TABLET TWICE DAILY, Disp: 90 tablet, Rfl: 3   bexarotene (TARGRETIN) 75 MG CAPS capsule, Take 75 mg by mouth daily., Disp: , Rfl:    Cranberry-Vitamin C-Probiotic (AZO CRANBERRY PO), Take 1 tablet by mouth daily., Disp: , Rfl:    cyanocobalamin (VITAMIN B12) 250 MCG tablet, Take 250 mcg by mouth daily., Disp: , Rfl:    diclofenac Sodium (VOLTAREN) 1 % GEL, Apply 2 g topically 4 (four) times daily., Disp: , Rfl:    estradiol (ESTRING) 2 MG vaginal ring, Place 2 mg vaginally every 3 (three) months. follow package directions, Disp: , Rfl:    gabapentin  (NEURONTIN ) 300 MG capsule, Take 300-600 mg by mouth 3 (three) times daily.,  Disp: , Rfl:    levothyroxine  (SYNTHROID ) 75 MCG tablet, Take 75 mcg by mouth daily before breakfast., Disp: , Rfl:    liothyronine  (CYTOMEL ) 5 MCG tablet, Take 10 mcg by mouth daily., Disp: , Rfl:    MELATONIN PO, Take by mouth., Disp: , Rfl:    methenamine  (HIPREX) 1 g tablet, Take 1 g by mouth daily., Disp: , Rfl:    nitrofurantoin  (MACRODANTIN ) 50 MG capsule, Take 50 mg by mouth daily., Disp: , Rfl:    pantoprazole  (PROTONIX ) 40 MG tablet, Take 40 mg by mouth daily., Disp: , Rfl:    polyethylene glycol (MIRALAX  / GLYCOLAX ) 17 g packet, Take 17 g by mouth daily as needed for mild constipation., Disp: , Rfl:    Probiotic Product (PROBIOTIC PO), Take 2 tablets by mouth daily., Disp: , Rfl:    RA BLACK COHOSH 200 MG CAPS, Take 200 mg by mouth at bedtime., Disp: , Rfl:    rosuvastatin (CRESTOR) 20 MG tablet, Take 20 mg by mouth at bedtime., Disp: , Rfl:    solifenacin  (VESICARE ) 5 MG tablet, Take 1 tablet by mouth daily., Disp: , Rfl:    VITAMIN D PO, Take 4,000 Units by mouth daily., Disp: , Rfl:   PAST MEDICAL HISTORY: Past Medical History:  Diagnosis Date   Anxiety    High cholesterol    Hypertrophic cardiomyopathy (HCC)    Multiple sclerosis    Paroxysmal A-fib (HCC)    Rotator cuff tear    left     PAST SURGICAL HISTORY: Past Surgical History:  Procedure Laterality Date   BLADDER SURGERY  2018   STEM placement    IR THORACENTESIS ASP PLEURAL SPACE W/IMG GUIDE  01/01/2023   ULNAR NERVE REPAIR Right 2010    FAMILY HISTORY: Family History  Problem Relation Age of Onset   Heart attack Mother    Transient ischemic attack Mother    Arthritis Mother    Heart Problems Father     SOCIAL HISTORY:  Social History   Socioeconomic History   Marital status: Married    Spouse name: Sim   Number of children: 3   Years of education: [employer] education level: Not on file  Occupational History   Occupation: Retired  Tobacco Use   Smoking status: Never   Smokeless tobacco: Never  Vaping Use   Vaping status: Never Used  Substance and Sexual Activity   Alcohol use: Yes    Comment: 2 drinks per week   Drug use: Never   Sexual activity: Not Currently  Other Topics Concern   Not on file  Social History Narrative   Originally right handed but had surgery and now uses left hand for eating and other activities   Caffeine use: 2 cups coffee per day   Lives with husband   Lives at [place] with husband in [place]   Social Drivers of Health   Financial Resource Strain: Not on file  Food Insecurity: Low Risk  (05/19/2024)   Received from [facility]   Hunger Vital Sign    Within the past 12 months, you worried that your food would run out before you got money to buy more: Never true    Within the past 12 months, the food you bought just didn't last and you didn't have money to get more. : Never true  Transportation Needs: No Transportation Needs (05/19/2024)   Received from [facility]    In the past 12 months, has  lack of reliable transportation kept you from medical appointments, meetings, work or from getting things needed for daily living? : No  Physical Activity: Not on file  Stress: Not on file  Social Connections: Not on file   Intimate Partner Violence: Not on file     PHYSICAL EXAM  Vitals:   06/01/24 0941 06/01/24 0946  BP: (!) 146/74 131/75  Pulse: (!) 59 (!) 58  Resp: 15   SpO2:  98%  Height: 5' 2 (1.575 m) 5' 2 (1.575 m)     Body mass index is 21.22 kg/m.   General: The patient is well-developed and well-nourished and in no acute distress.       HEENT:  Head is [place]/AT.  Sclera are anicteric.   .  Skin: Extremities are without rash but she has bilateral pedal edema and some skin abrasions.   Musculoskeletal: She has significant scoliosis with the left hip elevated and had tilted to the left with chin more to the right.  The sternocleidomastoid muscle does not protrude  Neurologic Exam  Mental status: The patient is alert and oriented x 3 at the time of the examination. The patient has apparent normal recent and remote memory, with an apparently normal attention span and concentration ability.   Speech is normal.  Cranial nerves: Extraocular movements are full.  Color vision was symmetric.  Facial strength and sensation is normal.. No obvious hearing deficits are noted.  Motor: Very mild intention tremor in the hands.  Muscle bulk is reduced in the ulnar innervated hand muscles bilaterally, right worse than left.   Tone is increased in the right leg.  Proximal arm strength is 4+-5 right > left.. Strength is 4-/5 in the ulnar innervated hand muscles on the right.  Proximal muscles on the right 4/5.   In the legs, strength was 2/5 in the left hip flexors and 4-/5 in the quadriceps and 3/5 elsewhere in the leg.  In the right leg, strength was 1/5 at the hip flexors, 2+/5 in the quadricep muscles and 2- to 2 in the lower leg.  Sensory: Sensory testing was intact to touch and vibration sensation in the legs..  Coordination: Cerebellar testing reveals good finger-nose-finger and she can't do heel-to-shin bilaterally.  Other: She has a rapid tremor in her hands that increases with intention.  Gait  and station: She is unable to stand or walk.  Reflexes: Deep tendon reflexes are increased in legs, right > left.          ASSESSMENT AND PLAN  Secondary progressive multiple sclerosis  Spastic diplegia (HCC)  Gait disorder  Wheelchair dependence  OSA on CPAP  Dysesthesia  Excessive daytime sleepiness  She will remain off of a disease modifying therapy.  She has an active form of secondary progressive MS.  2.  She will be doing a BiPAP titration    She has had some issues with [facility] communications.   We could explore differnet DME if she would like after the BiPAP titration.   3.  She takes gabapentin  300 mg po bid.  We can renew this when she runs out.    4.   Continue baclofen  5 in am and 5 mg in pm and other medications.   5.  I think the changes in the neck are more likely related to her scoliosis and not a torticollis as the sternocleidomastoid muscle does not protrude any.  Therefore I do not think Botox would be of much benefit. 6. return in 6  months or sooner if there are new or worsening neurologic symptoms.    [id number]:  This visit is part of a comprehensive longitudinal care medical relationship regarding the patients primary diagnosis of SPMS and related concerns.  SABRA Charlie LABOR. Vear, MD, [facility] 06/01/2024, 3:04 PM Certified in Neurology, Clinical Neurophysiology, Sleep Medicine and Neuroimaging  [facility] [address], [address] [place], [place] [address] [phone]

## 2024-06-07 ENCOUNTER — Encounter: Payer: Self-pay | Admitting: Cardiology

## 2024-06-07 DIAGNOSIS — I480 Paroxysmal atrial fibrillation: Secondary | ICD-10-CM

## 2024-06-07 MED ORDER — APIXABAN 2.5 MG PO TABS: 2.5000 mg | ORAL_TABLET | Freq: Two times a day (BID) | ORAL | 1 refills | Status: AC

## 2024-06-08 ENCOUNTER — Ambulatory Visit (INDEPENDENT_AMBULATORY_CARE_PROVIDER_SITE_OTHER): Admitting: Neurology

## 2024-06-08 DIAGNOSIS — G4733 Obstructive sleep apnea (adult) (pediatric): Secondary | ICD-10-CM | POA: Diagnosis not present

## 2024-06-17 ENCOUNTER — Telehealth: Payer: Self-pay

## 2024-06-17 ENCOUNTER — Other Ambulatory Visit: Payer: Self-pay | Admitting: Neurology

## 2024-06-17 DIAGNOSIS — G4733 Obstructive sleep apnea (adult) (pediatric): Secondary | ICD-10-CM

## 2024-06-17 NOTE — Telephone Encounter (Signed)
-----   Message from Charlie Crete, MD sent at 06/17/2024  9:55 AM EST ----- Regarding: BiPAP titration I will place an order in Epic  1.   Auto BiPAP EPAP 4-13 cm H2O pressure with PS 5 (IPAP 9-18 cm)

## 2024-06-17 NOTE — Telephone Encounter (Signed)
 Order sent to dme

## 2024-06-17 NOTE — Progress Notes (Signed)
 [facility] CPAP Summary    General Information  Name: Tina Webb, Tina Webb BMI: 0.00 Physician: CHARLIE CRETE, MD  ID: [id number] Height: 62.0 in Technician: Jesusa Haddock, RPSGT  Sex: Female Weight: 0.0 lb Record: [id number]  Age: 89 [[birth year]] Date: 06/08/2024     Medical & Medication History    HISTORY OF PRESENT ILLNESS: Tina Webb is a 89 y.o. woman with multiple sclerosis diagnosed in 1984. Update 06/01/2024 Since her last visit, she had a home sleep study showing severe sleep apnea (AHI equals 46). Central apnea index was 5/h. She started AutoPap with a range of 5-18 cm H2O pressure. Unfortunately, she finds CPAP uncomfortable and she notes a lot of leakage. Even when she is wearing the mask she has had only mild improvement of the AHI from 46 to 36. Central apnea index is 1 on recent download. Due to poor numbers, it is likely that she will need higher pressures or a different mode. She will do a BiPAP titration  Pacerone , Ampyra , Eliquis , Vitamin C, Lioresal , Targretin, Azo Cranberry, Vitamin B12, Voltaren, Estring, Neurontin , Synthroid , Cytomel , Melatonin, Hiprex, Macrodantin , Protonix , Miralax , Probiotic, RA Black Cohosh, Crestor, Vesicare , Vitamin D     Results 1.  BiPAP titration from +10/5 cm to 19/14 cm H2) pressure.    Optimal settings not confirmed though patient was able to tolerate pressures up to +18/13 2.  Hypoxemia noted only with respiratory events during REM sleep  Recommendations 1.   Auto BiPAP EPAP 4-13 cm H2O pressure with PS 5 (IPAP 9-18 cm) 2.   F/U with Dr. Crete      Comments   The patient came into the lab for a BiPAP titration study. The patient is already on auto-CPAP 6-18cmH2O. The patient was started on BiPAP per MD's order. The patient is still having a residual AHI. The patient took Melatonin and Gabapentin  prior to start of study. The patient was fitted with a F&P Evora (FFM) size XS. BiPAP was initiated at  10/5cmH2O. BiPAP was titrated up to 19/14cmH2O for events with desats. The patient had no restroom breaks. EKG was irregular. The patient does have a known cardiac history. Some mild snoring. All sleep stages witnessed. Respiratory events scored with a 4% desat. Can only sleep supine. Some leg movements noted.     CPAP start time: 10:21:45 PM CPAP end time: 05:06:43 AM   Time Total Supine Side Prone Upright  Recording (TRT) 6h 45.7m 6h 45.68m 0h 0.14m 0h 0.7m 0h 0.91m  Sleep (TST) 4h 27.74m 4h 27.40m 0h 0.65m 0h 0.61m 0h 0.24m   Latency N1 N2 N3 REM Onset Per. Slp. Eff.  Actual 0h 0.30m 0h 13.22m 1h 10.32m 0h 42.56m 0h 6.79m 0h 18.63m 66.05%   Stg Dur Wake N1 N2 N3 REM  Total 137.5 37.5 205.0 2.5 22.5  Supine 137.5 37.5 205.0 2.5 22.5  Side 0.0 0.0 0.0 0.0 0.0  Prone 0.0 0.0 0.0 0.0 0.0  Upright 0.0 0.0 0.0 0.0 0.0   Stg % Wake N1 N2 N3 REM  Total 34.0 14.0 76.6 0.9 8.4  Supine 34.0 14.0 76.6 0.9 8.4  Side 0.0 0.0 0.0 0.0 0.0  Prone 0.0 0.0 0.0 0.0 0.0  Upright 0.0 0.0 0.0 0.0 0.0     Apnea Summary Sub Supine Side Prone Upright  Total 103 Total 103 103 0 0 0    REM 10 10 0 0 0    NREM 93 93 0 0 0  Obs 77 REM 10 10 0  0 0    NREM 67 67 0 0 0  Mix 20 REM 0 0 0 0 0    NREM 20 20 0 0 0  Cen 6 REM 0 0 0 0 0    NREM 6 6 0 0 0   Rera Summary Sub Supine Side Prone Upright  Total 0 Total 0 0 0 0 0    REM 0 0 0 0 0    NREM 0 0 0 0 0   Hypopnea Summary Sub Supine Side Prone Upright  Total 45 Total 45 45 0 0 0    REM 4 4 0 0 0    NREM 41 41 0 0 0   4% Hypopnea Summary Sub Supine Side Prone Upright  Total (4%) 19 Total 19 19 0 0 0    REM 3 3 0 0 0    NREM 16 16 0 0 0     AHI Total Obs Mix Cen  33.20 Apnea 23.10 17.27 4.49 1.35   Hypopnea 10.09 -- -- --  27.36 Hypopnea (4%) 4.26 -- -- --    Total Supine Side Prone Upright  Position AHI 33.20 33.20 0.00 0.00 0.00  REM AHI 37.33   NREM AHI 32.82   Position RDI 33.20 33.20 0.00 0.00 0.00  REM RDI 37.33   NREM RDI 32.82    4% Hypopnea  Total Supine Side Prone Upright  Position AHI (4%) 27.36 27.36 0.00 0.00 0.00  REM AHI (4%) 34.67   NREM AHI (4%) 26.69   Position RDI (4%) 27.36 27.36 0.00 0.00 0.00  REM RDI (4%) 34.67   NREM RDI (4%) 26.69    Desaturation Information  <100% <90% <80% <70% <60% <50% <40%  Supine 50 14 6 0 0 0 0  Side 0 0 0 0 0 0 0  Prone 0 0 0 0 0 0 0  Upright 0 0 0 0 0 0 0  Total 50 14 6 0 0 0 0  Desaturation threshold setting: 4% Minimum desaturation setting: 10 seconds SaO2 nadir: 69% The longest event was a 111 sec obstructive Apnea with a minimum SaO2 of 70%. The lowest SaO2 was 70% associated with a 111 sec obstructive Apnea. EKG Rates EKG Avg Max Min  Awake 62 89 52  Asleep 64 77 56  EKG Events: N/A Awakening/Arousal Information # of Awakenings 57  Wake after sleep onset 131.25m  Wake after persistent sleep 122.107m   Arousal Assoc. Arousals Index  Apneas 45 10.1  Hypopneas 15 3.4  Leg Movements 0 0.0  Snore 0.0 0.0  PTT Arousals 0 0.0  Spontaneous 59 13.2  Total 119 26.7  Myoclonus Information PLMS LMs Index  Total LMs during PLMS 63 14.1  LMs w/ Microarousals 0 0.0   LM LMs Index  w/ Microarousal 0 0.0  w/ Awakening 0 0.0  w/ Resp Event 5 1.1  Spontaneous 16 3.6  Total 21 4.7

## 2024-07-05 ENCOUNTER — Ambulatory Visit: Payer: Self-pay | Admitting: Surgery

## 2024-07-12 ENCOUNTER — Ambulatory Visit (HOSPITAL_COMMUNITY): Admit: 2024-07-12 | Admitting: Surgery

## 2024-07-12 SURGERY — REPAIR, HERNIA, VENTRAL
Anesthesia: General

## 2024-07-26 ENCOUNTER — Telehealth: Payer: Self-pay | Admitting: Neurology

## 2024-07-26 NOTE — Telephone Encounter (Signed)
 Pt reports after a couple of hours of using her CPAP she gas terrible headaches, also the amount of events has not gone down, please call pt to discuss.

## 2024-07-26 NOTE — Telephone Encounter (Signed)
 Called and spoke to pt and she stated that she feels like her mask is entirely to tight. Pt has been taking 2 tylenol  and then the pain alleviates. Pt stated that it happens everytime she wears mask. Pt denied possible weight gain making mask tighter. Pt is unsure if pressure is right and causing these migraines.

## 2024-08-09 ENCOUNTER — Other Ambulatory Visit: Payer: Self-pay | Admitting: Neurology

## 2024-08-09 ENCOUNTER — Telehealth: Payer: Self-pay | Admitting: Neurology

## 2024-08-09 DIAGNOSIS — R269 Unspecified abnormalities of gait and mobility: Secondary | ICD-10-CM

## 2024-08-09 DIAGNOSIS — G35D Multiple sclerosis, unspecified: Secondary | ICD-10-CM

## 2024-08-09 MED ORDER — DALFAMPRIDINE ER 10 MG PO TB12: ORAL_TABLET | ORAL | 11 refills | Status: AC

## 2024-08-10 NOTE — Telephone Encounter (Signed)
 Called and spoke to pts husband and relayed information. Pt husband voiced gratitude and understanding

## 2024-08-11 ENCOUNTER — Other Ambulatory Visit: Payer: Self-pay | Admitting: Neurology

## 2024-08-12 ENCOUNTER — Telehealth: Payer: Self-pay | Admitting: Neurology

## 2024-08-12 NOTE — Telephone Encounter (Signed)
 I returned call to pt and stated take amprya bid and that I will send to pa team to complete

## 2024-08-12 NOTE — Telephone Encounter (Signed)
 Pt called stating that  Pharmacy informed  need to reach out to MD to see if MD can can send Insurance a predetermination  for  Pt to not take generic  version of medication . Pt stated she  doesn't have any medication and will need refill also Pt stated  she would ike a cal on how she is suppose to take medication as well  AMPYRA  10 MG TB12

## 2024-09-13 ENCOUNTER — Ambulatory Visit: Admitting: Orthopedic Surgery

## 2024-09-15 ENCOUNTER — Ambulatory Visit: Admitting: Neurology
